# Patient Record
Sex: Female | Born: 1940 | Race: Black or African American | Hispanic: No | Marital: Single | Smoking: Former smoker
Health system: Southern US, Community
[De-identification: ages and names within clinical notes are randomized; demographics above are authoritative.]

## PROBLEM LIST (undated history)

## (undated) DIAGNOSIS — N289 Disorder of kidney and ureter, unspecified: Secondary | ICD-10-CM

## (undated) DIAGNOSIS — N189 Chronic kidney disease, unspecified: Secondary | ICD-10-CM

## (undated) DIAGNOSIS — T829XXA Unspecified complication of cardiac and vascular prosthetic device, implant and graft, initial encounter: Secondary | ICD-10-CM

## (undated) DIAGNOSIS — D649 Anemia, unspecified: Secondary | ICD-10-CM

## (undated) DIAGNOSIS — L89159 Pressure ulcer of sacral region, unspecified stage: Secondary | ICD-10-CM

## (undated) DIAGNOSIS — I10 Essential (primary) hypertension: Secondary | ICD-10-CM

## (undated) DIAGNOSIS — I429 Cardiomyopathy, unspecified: Secondary | ICD-10-CM

## (undated) DIAGNOSIS — E039 Hypothyroidism, unspecified: Secondary | ICD-10-CM

## (undated) DIAGNOSIS — H409 Unspecified glaucoma: Secondary | ICD-10-CM

## (undated) DIAGNOSIS — Z9989 Dependence on other enabling machines and devices: Secondary | ICD-10-CM

## (undated) DIAGNOSIS — I509 Heart failure, unspecified: Secondary | ICD-10-CM

## (undated) HISTORY — PX: CATARACT EXTRACTION W/ INTRAOCULAR LENS IMPLANT: SHX1309

---

## 2006-01-17 ENCOUNTER — Ambulatory Visit: Payer: Self-pay | Admitting: Ophthalmology

## 2012-07-03 ENCOUNTER — Ambulatory Visit: Payer: Self-pay | Admitting: Family Medicine

## 2016-05-18 ENCOUNTER — Encounter: Payer: Self-pay | Admitting: *Deleted

## 2016-05-18 ENCOUNTER — Emergency Department
Admission: EM | Admit: 2016-05-18 | Discharge: 2016-05-18 | Disposition: A | Discharge status: Other Acute Inpatient Hospital | Payer: Medicare Other | Attending: Emergency Medicine | Admitting: Emergency Medicine

## 2016-05-18 DIAGNOSIS — T22312A Burn of third degree of left forearm, initial encounter: Secondary | ICD-10-CM | POA: Insufficient documentation

## 2016-05-18 DIAGNOSIS — X088XXA Exposure to other specified smoke, fire and flames, initial encounter: Secondary | ICD-10-CM | POA: Diagnosis not present

## 2016-05-18 DIAGNOSIS — Y999 Unspecified external cause status: Secondary | ICD-10-CM | POA: Diagnosis not present

## 2016-05-18 DIAGNOSIS — Y93G3 Activity, cooking and baking: Secondary | ICD-10-CM | POA: Diagnosis not present

## 2016-05-18 DIAGNOSIS — I10 Essential (primary) hypertension: Secondary | ICD-10-CM | POA: Insufficient documentation

## 2016-05-18 DIAGNOSIS — F17200 Nicotine dependence, unspecified, uncomplicated: Secondary | ICD-10-CM | POA: Insufficient documentation

## 2016-05-18 DIAGNOSIS — Y929 Unspecified place or not applicable: Secondary | ICD-10-CM | POA: Diagnosis not present

## 2016-05-18 HISTORY — DX: Disorder of kidney and ureter, unspecified: N28.9

## 2016-05-18 HISTORY — DX: Essential (primary) hypertension: I10

## 2016-05-18 LAB — CBC WITH DIFFERENTIAL/PLATELET
BASOS ABS: 0.1 10*3/uL (ref 0–0.1)
Basophils Relative: 1 %
Eosinophils Absolute: 0.1 10*3/uL (ref 0–0.7)
Eosinophils Relative: 2 %
HEMATOCRIT: 29.7 % — AB (ref 35.0–47.0)
HEMOGLOBIN: 9.4 g/dL — AB (ref 12.0–16.0)
Lymphocytes Relative: 24 %
Lymphs Abs: 1.7 10*3/uL (ref 1.0–3.6)
MCH: 28.5 pg (ref 26.0–34.0)
MCHC: 31.5 g/dL — ABNORMAL LOW (ref 32.0–36.0)
MCV: 90.4 fL (ref 80.0–100.0)
Monocytes Absolute: 1 10*3/uL — ABNORMAL HIGH (ref 0.2–0.9)
Monocytes Relative: 14 %
NEUTROS ABS: 4.3 10*3/uL (ref 1.4–6.5)
NEUTROS PCT: 59 %
Platelets: 323 10*3/uL (ref 150–440)
RBC: 3.28 MIL/uL — ABNORMAL LOW (ref 3.80–5.20)
RDW: 14.3 % (ref 11.5–14.5)
WBC: 7.2 10*3/uL (ref 3.6–11.0)

## 2016-05-18 LAB — GLUCOSE, CAPILLARY: GLUCOSE-CAPILLARY: 70 mg/dL (ref 65–99)

## 2016-05-18 LAB — BASIC METABOLIC PANEL
ANION GAP: 14 (ref 5–15)
BUN: 55 mg/dL — ABNORMAL HIGH (ref 6–20)
CALCIUM: 8.4 mg/dL — AB (ref 8.9–10.3)
CO2: 15 mmol/L — AB (ref 22–32)
Chloride: 113 mmol/L — ABNORMAL HIGH (ref 101–111)
Creatinine, Ser: 5.81 mg/dL — ABNORMAL HIGH (ref 0.44–1.00)
GFR, EST AFRICAN AMERICAN: 7 mL/min — AB (ref >60)
GFR, EST NON AFRICAN AMERICAN: 6 mL/min — AB (ref >60)
GLUCOSE: 81 mg/dL (ref 65–99)
POTASSIUM: 4.2 mmol/L (ref 3.5–5.1)
Sodium: 142 mmol/L (ref 135–145)

## 2016-05-18 MED ORDER — FENTANYL CITRATE (PF) 100 MCG/2ML IJ SOLN
100.0000 ug | INTRAMUSCULAR | Status: DC | PRN
  Administered 2016-05-18: 100 ug via INTRAVENOUS
  Filled 2016-05-18: qty 2
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
## 2016-05-18 NOTE — ED Notes (Signed)
Called for transport acems  [phone]

## 2016-12-20 ENCOUNTER — Encounter: Payer: Self-pay | Admitting: *Deleted

## 2016-12-21 NOTE — Discharge Instructions (Signed)
Cataract Surgery, Care After °Refer to this sheet in the next few weeks. These instructions provide you with information about caring for yourself after your procedure. Your health care provider may also give you more specific instructions. Your treatment has been planned according to current medical practices, but problems sometimes occur. Call your health care provider if you have any problems or questions after your procedure. °What can I expect after the procedure? °After the procedure, it is common to have: °· Itching. °· Discomfort. °· Fluid discharge. °· Sensitivity to light and to touch. °· Bruising. °Follow these instructions at home: °Eye Care  °· Check your eye every day for signs of infection. Watch for: °¨ Redness, swelling, or pain. °¨ Fluid, blood, or pus. °¨ Warmth. °¨ Bad smell. °Activity  °· Avoid strenuous activities, such as playing contact sports, for as long as told by your health care provider. °· Do not drive or operate heavy machinery until your health care provider approves. °· Do not bend or lift heavy objects . Bending increases pressure in the eye. You can walk, climb stairs, and do light household chores. °· Ask your health care provider when you can return to work. If you work in a dusty environment, you may be advised to wear protective eyewear for a period of time. °General instructions  °· Take or apply over-the-counter and prescription medicines only as told by your health care provider. This includes eye drops. °· Do not touch or rub your eyes. °· If you were given a protective shield, wear it as told by your health care provider. If you were not given a protective shield, wear sunglasses as told by your health care provider to protect your eyes. °· Keep the area around your eye clean and dry. Avoid swimming or allowing water to hit you directly in the face while showering until told by your health care provider. Keep soap and shampoo out of your eyes. °· Do not put a contact lens  into the affected eye or eyes until your health care provider approves. °· Keep all follow-up visits as told by your health care provider. This is important. °Contact a health care provider if: ° °· You have increased bruising around your eye. °· You have pain that is not helped with medicine. °· You have a fever. °· You have redness, swelling, or pain in your eye. °· You have fluid, blood, or pus coming from your incision. °· Your vision gets worse. °Get help right away if: °· You have sudden vision loss. °This information is not intended to replace advice given to you by your health care provider. Make sure you discuss any questions you have with your health care provider. °Document Released: 03/31/2005 Document Revised: 01/20/2016 Document Reviewed: 07/22/2015 °Elsevier Interactive Patient Education © 2017 [employer] ° ° ° ° °General Anesthesia, Adult, Care After °These instructions provide you with information about caring for yourself after your procedure. Your health care provider may also give you more specific instructions. Your treatment has been planned according to current medical practices, but problems sometimes occur. Call your health care provider if you have any problems or questions after your procedure. °What can I expect after the procedure? °After the procedure, it is common to have: °· Vomiting. °· A sore throat. °· Mental slowness. °It is common to feel: °· Nauseous. °· Cold or shivery. °· Sleepy. °· Tired. °· Sore or achy, even in parts of your body where you did not have surgery. °Follow these instructions at   home: °For at least 24 hours after the procedure:  °· Do not: °¨ Participate in activities where you could fall or become injured. °¨ Drive. °¨ Use heavy machinery. °¨ Drink alcohol. °¨ Take sleeping pills or medicines that cause drowsiness. °¨ Make important decisions or sign legal documents. °¨ Take care of children on your own. °· Rest. °Eating and drinking  °· If you vomit, drink  water, juice, or soup when you can drink without vomiting. °· Drink enough fluid to keep your urine clear or pale yellow. °· Make sure you have little or no nausea before eating solid foods. °· Follow the diet recommended by your health care provider. °General instructions  °· Have a responsible adult stay with you until you are awake and alert. °· Return to your normal activities as told by your health care provider. Ask your health care provider what activities are safe for you. °· Take over-the-counter and prescription medicines only as told by your health care provider. °· If you smoke, do not smoke without supervision. °· Keep all follow-up visits as told by your health care provider. This is important. °Contact a health care provider if: °· You continue to have nausea or vomiting at home, and medicines are not helpful. °· You cannot drink fluids or start eating again. °· You cannot urinate after 8-12 hours. °· You develop a skin rash. °· You have fever. °· You have increasing redness at the site of your procedure. °Get help right away if: °· You have difficulty breathing. °· You have chest pain. °· You have unexpected bleeding. °· You feel that you are having a life-threatening or urgent problem. °This information is not intended to replace advice given to you by your health care provider. Make sure you discuss any questions you have with your health care provider. °Document Released: 12/18/2000 Document Revised: 02/14/2016 Document Reviewed: 08/26/2015 °Elsevier Interactive Patient Education © 2017 [employer] Inc[employer] ° °

## 2016-12-25 ENCOUNTER — Ambulatory Visit: Payer: Medicare Other | Admitting: Anesthesiology

## 2016-12-25 ENCOUNTER — Encounter
Admission: RE | Disposition: A | Discharge status: Home / Self Care | Payer: Self-pay | Source: Ambulatory Visit | Attending: Ophthalmology

## 2016-12-25 ENCOUNTER — Ambulatory Visit
Admission: RE | Admit: 2016-12-25 | Discharge: 2016-12-25 | Disposition: A | Discharge status: Home / Self Care | Payer: Medicare Other | Source: Ambulatory Visit | Attending: Ophthalmology | Admitting: Ophthalmology

## 2016-12-25 DIAGNOSIS — H2511 Age-related nuclear cataract, right eye: Secondary | ICD-10-CM | POA: Diagnosis present

## 2016-12-25 DIAGNOSIS — F17200 Nicotine dependence, unspecified, uncomplicated: Secondary | ICD-10-CM | POA: Insufficient documentation

## 2016-12-25 DIAGNOSIS — E039 Hypothyroidism, unspecified: Secondary | ICD-10-CM | POA: Diagnosis not present

## 2016-12-25 HISTORY — DX: Hypothyroidism, unspecified: E03.9

## 2016-12-25 HISTORY — PX: CATARACT EXTRACTION W/PHACO: SHX586

## 2016-12-25 HISTORY — DX: Dependence on other enabling machines and devices: Z99.89

## 2016-12-25 SURGERY — PHACOEMULSIFICATION, CATARACT, WITH IOL INSERTION
Anesthesia: Monitor Anesthesia Care | Site: Eye | Laterality: Right | Wound class: Clean

## 2016-12-25 MED ORDER — LABETALOL HCL 5 MG/ML IV SOLN
5.0000 mg | INTRAVENOUS | Status: DC | PRN
  Administered 2016-12-25: 5 mg via INTRAVENOUS

## 2016-12-25 MED ORDER — ACETAMINOPHEN 160 MG/5ML PO SOLN: 325.0000 mg | ORAL | Status: DC | PRN

## 2016-12-25 MED ORDER — BRIMONIDINE TARTRATE 0.2 % OP SOLN
OPHTHALMIC | Status: DC | PRN
  Administered 2016-12-25: 1 [drp] via OPHTHALMIC

## 2016-12-25 MED ORDER — LIDOCAINE HCL (PF) 2 % IJ SOLN
INTRAMUSCULAR | Status: DC | PRN
  Administered 2016-12-25: 1 mL

## 2016-12-25 MED ORDER — EPINEPHRINE PF 1 MG/ML IJ SOLN
INTRAOCULAR | Status: DC | PRN
  Administered 2016-12-25: 122 mL via OPHTHALMIC

## 2016-12-25 MED ORDER — ONDANSETRON HCL 4 MG/2ML IJ SOLN: 4.0000 mg | Freq: Once | INTRAMUSCULAR | Status: DC | PRN

## 2016-12-25 MED ORDER — MOXIFLOXACIN HCL 0.5 % OP SOLN
OPHTHALMIC | Status: DC | PRN
  Administered 2016-12-25: .2 mL via OPHTHALMIC

## 2016-12-25 MED ORDER — NA HYALUR & NA CHOND-NA HYALUR 0.4-0.35 ML IO KIT
PACK | INTRAOCULAR | Status: DC | PRN
  Administered 2016-12-25: 1 mL via INTRAOCULAR

## 2016-12-25 MED ORDER — MIDAZOLAM HCL 2 MG/2ML IJ SOLN
INTRAMUSCULAR | Status: DC | PRN
  Administered 2016-12-25: 1 mg via INTRAVENOUS

## 2016-12-25 MED ORDER — ACETAMINOPHEN 325 MG PO TABS
325.0000 mg | ORAL_TABLET | ORAL | Status: DC | PRN
  Administered 2016-12-25: 650 mg via ORAL

## 2016-12-25 MED ORDER — FENTANYL CITRATE (PF) 100 MCG/2ML IJ SOLN
INTRAMUSCULAR | Status: DC | PRN
  Administered 2016-12-25: 50 ug via INTRAVENOUS

## 2016-12-25 MED ORDER — ARMC OPHTHALMIC DILATING DROPS
1.0000 "application " | OPHTHALMIC | Status: DC | PRN
  Administered 2016-12-25 (×3): 1 via OPHTHALMIC

## 2016-12-25 MED ORDER — TIMOLOL MALEATE 0.5 % OP SOLN
OPHTHALMIC | Status: DC | PRN
  Administered 2016-12-25: 1 [drp] via OPHTHALMIC

## 2016-12-25 MED ORDER — TRYPAN BLUE 0.06 % OP SOLN
OPHTHALMIC | Status: DC | PRN
  Administered 2016-12-25: 0.5 mL via INTRAOCULAR

## 2016-12-25 MED ORDER — MOXIFLOXACIN HCL 0.5 % OP SOLN
1.0000 [drp] | OPHTHALMIC | Status: DC | PRN
  Administered 2016-12-25 (×3): 1 [drp] via OPHTHALMIC

## 2016-12-25 SURGICAL SUPPLY — 23 items
APPLICATOR COTTON TIP 3IN (MISCELLANEOUS) ×3 IMPLANT
CANNULA ANT/CHMB 27GA (MISCELLANEOUS) ×3 IMPLANT
DISSECTOR HYDRO NUCLEUS 50X22 (MISCELLANEOUS) ×3 IMPLANT
GLOVE BIO SURGEON STRL SZ7 (GLOVE) ×3 IMPLANT
GLOVE SURG LX 6.5 MICRO (GLOVE) ×2
GLOVE SURG LX STRL 6.5 MICRO (GLOVE) ×1 IMPLANT
GOWN STRL REUS W/ TWL LRG LVL3 (GOWN DISPOSABLE) ×2 IMPLANT
GOWN STRL REUS W/TWL LRG LVL3 (GOWN DISPOSABLE) ×4
LENS IOL ACRSF IQ ULTRA 19.0 (Intraocular Lens) ×1 IMPLANT
LENS IOL ACRYSOF IQ 19.0 (Intraocular Lens) ×3 IMPLANT
MARKER SKIN DUAL TIP RULER LAB (MISCELLANEOUS) ×3 IMPLANT
PACK CATARACT BRASINGTON (MISCELLANEOUS) ×3 IMPLANT
PACK EYE AFTER SURG (MISCELLANEOUS) ×3 IMPLANT
PACK OPTHALMIC (MISCELLANEOUS) ×3 IMPLANT
RING MALYGIN 7.0 (MISCELLANEOUS) IMPLANT
SUT ETHILON 10-0 CS-B-6CS-B-6 (SUTURE)
SUT VICRYL  9 0 (SUTURE)
SUT VICRYL 9 0 (SUTURE) IMPLANT
SUTURE EHLN 10-0 CS-B-6CS-B-6 (SUTURE) IMPLANT
SYR TB 1ML LUER SLIP (SYRINGE) ×3 IMPLANT
WATER STERILE IRR 250ML POUR (IV SOLUTION) ×3 IMPLANT
WICK EYE OCUCEL (MISCELLANEOUS) IMPLANT
WIPE NON LINTING 3.25X3.25 (MISCELLANEOUS) ×3 IMPLANT

## 2016-12-25 NOTE — Transfer of Care (Signed)
Immediate Anesthesia Transfer of Care Note  Patient: Ann Cunningham  Procedure(s) Performed: Procedure(s) with comments: CATARACT EXTRACTION PHACO AND INTRAOCULAR LENS PLACEMENT ([place])  Right complicated (Right) - Vision Blue  Patient Location: PACU  Anesthesia Type: MAC  Level of Consciousness: awake, alert  and patient cooperative  Airway and Oxygen Therapy: Patient Spontanous Breathing and Patient connected to supplemental oxygen  Post-op Assessment: Post-op Vital signs reviewed, Patient's Cardiovascular Status Stable, Respiratory Function Stable, Patent Airway and No signs of Nausea or vomiting  Post-op Vital Signs: Reviewed and stable  Complications: No apparent anesthesia complications

## 2016-12-25 NOTE — Anesthesia Postprocedure Evaluation (Signed)
Anesthesia Post Note  Patient: Ann Cunningham  Procedure(s) Performed: Procedure(s) (LRB): CATARACT EXTRACTION PHACO AND INTRAOCULAR LENS PLACEMENT ([facility])  Right complicated (Right)  Patient location during evaluation: PACU Anesthesia Type: MAC Level of consciousness: awake Pain management: pain level controlled Vital Signs Assessment: post-procedure vital signs reviewed and stable Respiratory status: spontaneous breathing, nonlabored ventilation and respiratory function stable Cardiovascular status: stable and blood pressure returned to baseline Anesthetic complications: no    Veda Canning

## 2016-12-25 NOTE — Anesthesia Procedure Notes (Signed)
Procedure Name: MAC Date/Time: 12/25/2016 11:20 AM Performed by: Janna Arch Pre-anesthesia Checklist: Patient identified, Emergency Drugs available, Suction available and Patient being monitored Patient Re-evaluated:Patient Re-evaluated prior to inductionOxygen Delivery Method: Nasal cannula

## 2016-12-25 NOTE — Op Note (Signed)
Date of Surgery: 12/25/2016  PREOPERATIVE DIAGNOSES: Visually significant nuclear sclerotic cataract, right eye.  POSTOPERATIVE DIAGNOSES: Same  PROCEDURES PERFORMED: Cataract extraction with intraocular lens implant, right eye with the aid of vision blue dye.  SURGEON: Almon Hercules, M.D.  ANESTHESIA: MAC and topical  IMPLANTS: [id number] +19.0 D  Implant Name Type Inv. Item Serial No. Manufacturer Lot No. LRB No. Used  LENS IOL ACRYSOF IQ 19.0 - [id number] Intraocular Lens LENS IOL ACRYSOF IQ 19.0 [id number] [employer]   Right 1     COMPLICATIONS: None.  DESCRIPTION OF PROCEDURE: Therapeutic options were discussed with the patient preoperatively, including a discussion of risks and benefits of surgery. Informed consent was obtained. An IOL-Master and immersion biometry were used to take the lens measurements, and a dilated fundus exam was performed within 6 months of the surgical date.  The patient was premedicated and brought to the operating room and placed on the operating table in the supine position. After adequate anesthesia, the patient was prepped and draped in the usual sterile ophthalmic fashion. A wire lid speculum was inserted and the microscope was positioned. A Superblade was used to create a paracentesis site at the limbus and a small amount of dilute preservative free lidocaine was instilled into the anterior chamber, followed by dispersive viscoelastic. A clear corneal incision was created temporally using a 2.4 mm keratome blade. Capsulorrhexis was then performed. In situ phacoemulsification was performed.  Cortical material was removed with the irrigation-aspiration unit. Dispersive viscoelastic was instilled to open the capsular bag. A posterior chamber intraocular lens with the specifications above was inserted and positioned. Irrigation-aspiration was used to remove all viscoelastic. Vigamox 1cc was instilled into the anterior chamber, and the corneal incision was checked and  found to be water tight. The eyelid speculum was removed.  The operative eye was covered with protective goggles after instilling 1 drop of timolol and brimonidine. The patient tolerated the procedure well. There were no complications.

## 2016-12-25 NOTE — H&P (Signed)
H+P reviewed and is up to date, please see paper chart.  
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
## 2016-12-26 ENCOUNTER — Encounter: Payer: Self-pay | Admitting: Ophthalmology

## 2017-01-04 ENCOUNTER — Emergency Department: Payer: Medicare Other

## 2017-01-04 ENCOUNTER — Emergency Department
Admission: EM | Admit: 2017-01-04 | Discharge: 2017-01-04 | Disposition: A | Discharge status: Home / Self Care | Payer: Medicare Other | Attending: Emergency Medicine | Admitting: Emergency Medicine

## 2017-01-04 DIAGNOSIS — M79605 Pain in left leg: Secondary | ICD-10-CM | POA: Diagnosis present

## 2017-01-04 DIAGNOSIS — I6782 Cerebral ischemia: Secondary | ICD-10-CM | POA: Diagnosis not present

## 2017-01-04 DIAGNOSIS — F17290 Nicotine dependence, other tobacco product, uncomplicated: Secondary | ICD-10-CM | POA: Insufficient documentation

## 2017-01-04 DIAGNOSIS — E039 Hypothyroidism, unspecified: Secondary | ICD-10-CM | POA: Insufficient documentation

## 2017-01-04 DIAGNOSIS — Z7982 Long term (current) use of aspirin: Secondary | ICD-10-CM | POA: Insufficient documentation

## 2017-01-04 DIAGNOSIS — I10 Essential (primary) hypertension: Secondary | ICD-10-CM | POA: Insufficient documentation

## 2017-01-04 LAB — BASIC METABOLIC PANEL
Anion gap: 13 (ref 5–15)
BUN: 55 mg/dL — AB (ref 6–20)
CHLORIDE: 105 mmol/L (ref 101–111)
CO2: 22 mmol/L (ref 22–32)
Calcium: 8 mg/dL — ABNORMAL LOW (ref 8.9–10.3)
Creatinine, Ser: 5.4 mg/dL — ABNORMAL HIGH (ref 0.44–1.00)
GFR calc Af Amer: 8 mL/min — ABNORMAL LOW (ref >60)
GFR calc non Af Amer: 7 mL/min — ABNORMAL LOW (ref >60)
GLUCOSE: 98 mg/dL (ref 65–99)
POTASSIUM: 3.8 mmol/L (ref 3.5–5.1)
Sodium: 140 mmol/L (ref 135–145)

## 2017-01-04 LAB — CBC WITH DIFFERENTIAL/PLATELET
Basophils Absolute: 0.1 10*3/uL (ref 0–0.1)
Basophils Relative: 1 %
Eosinophils Absolute: 0.1 10*3/uL (ref 0–0.7)
Eosinophils Relative: 1 %
HEMATOCRIT: 27.9 % — AB (ref 35.0–47.0)
HEMOGLOBIN: 8.9 g/dL — AB (ref 12.0–16.0)
Lymphocytes Relative: 25 %
Lymphs Abs: 1.5 10*3/uL (ref 1.0–3.6)
MCH: 28.4 pg (ref 26.0–34.0)
MCHC: 32 g/dL (ref 32.0–36.0)
MCV: 88.7 fL (ref 80.0–100.0)
MONO ABS: 0.9 10*3/uL (ref 0.2–0.9)
MONOS PCT: 14 %
Neutro Abs: 3.6 10*3/uL (ref 1.4–6.5)
Neutrophils Relative %: 59 %
PLATELETS: 247 10*3/uL (ref 150–440)
RBC: 3.14 MIL/uL — ABNORMAL LOW (ref 3.80–5.20)
RDW: 15 % — ABNORMAL HIGH (ref 11.5–14.5)
WBC: 6.1 10*3/uL (ref 3.6–11.0)

## 2017-01-04 MED ORDER — DICLOFENAC SODIUM 1 % TD GEL: 2.0000 g | Freq: Four times a day (QID) | TRANSDERMAL | 0 refills | Status: DC

## 2017-01-04 NOTE — ED Provider Notes (Signed)
[facility] Emergency Department Provider Note  ____________________________________________   I have reviewed the triage vital signs and the nursing notes.   HISTORY  Chief Complaint Leg Pain and Leg Swelling   History limited by: Not Limited   HPI Ann Cunningham is a 76 y.o. female who presents to the emergency department today because of concerns for some discomfort to her left hip and leg. She states this is been going on for almost one week. The pain is worse when the patient is trying to move the leg. She feels like it is heavy. She denies any numbness to the leg. Denies any injury to the leg.   Past Medical History:  Diagnosis Date  . Hypertension   . Hypothyroidism   . Renal disorder   . Uses walker     There are no active problems to display for this patient.   Past Surgical History:  Procedure Laterality Date  . CATARACT EXTRACTION W/ INTRAOCULAR LENS IMPLANT    . CATARACT EXTRACTION W/PHACO Right 12/25/2016   Procedure: CATARACT EXTRACTION PHACO AND INTRAOCULAR LENS PLACEMENT ([facility])  Right complicated;  Surgeon: Sherald Hess, MD;  Location[facility];  Service: Ophthalmology;  Laterality: Right;  Vision Blue    Prior to Admission medications   Medication Sig Start Date End Date Taking? Authorizing Provider  aspirin 81 MG tablet Take 81 mg by mouth daily.    Historical Provider, MD  Brinzolamide-[facility]rimon[facility] OP) Apply to eye daily as needed.    Historical Provider, MD  furosemide (LASIX) 20 MG tablet Take 20 mg by mouth.    Historical Provider, MD  levothyroxine (SYNTHROID, LEVOTHROID) 25 MCG tablet Take 25 mcg by mouth daily before breakfast.    Historical Provider, MD  pravastatin (PRAVACHOL) 20 MG tablet Take 20 mg by mouth daily.    Historical Provider, MD    Allergies Patient has no known allergies.  No family history on file.  Social History Social History  Substance Use Topics  . Smoking status:  Current Some Day Smoker  . Smokeless tobacco: Current User    Types: Snuff  . Alcohol use No    Review of Systems  Constitutional: Negative for fever. Cardiovascular: Negative for chest pain. Respiratory: Negative for shortness of breath. Gastrointestinal: Negative for abdominal pain, vomiting and diarrhea. Genitourinary: Negative for dysuria. Musculoskeletal: Positive for left hip pain. Neurological: Negative for headaches, focal weakness or numbness.  10-point ROS otherwise negative.  ____________________________________________   PHYSICAL EXAM:  VITAL SIGNS: ED Triage Vitals  Enc Vitals Group     BP 01/04/17 1408 133/61     Pulse Rate 01/04/17 1408 (!) 107     Resp 01/04/17 1408 18     Temp 01/04/17 1408 98.8 F (37.1 C)     Temp Source 01/04/17 1408 Oral     SpO2 01/04/17 1408 100 %     Weight 01/04/17 1405 155 lb (70.3 kg)     Height 01/04/17 1405  (1.575 m)   Constitutional: Alert and oriented. Well appearing and in no distress. Eyes: Conjunctivae are normal. Normal extraocular movements. ENT   Head: Normocephalic and atraumatic.   Nose: No congestion/rhinnorhea.   Mouth/Throat: Mucous membranes are moist.   Neck: No stridor. Hematological/Lymphatic/Immunilogical: No cervical lymphadenopathy. Cardiovascular: Normal rate, regular rhythm.  No murmurs, rubs, or gallops. Respiratory: Normal respiratory effort without tachypnea nor retractions. Breath sounds are clear and equal bilaterally. No wheezes/rales/rhonchi. Gastrointestinal: Soft and non tender. No rebound. No guarding.  Genitourinary:  Deferred Musculoskeletal: Normal range of motion in all extremities. No lower extremity edema. Neurologic:  Normal speech and language. No gross focal neurologic deficits are appreciated.  Skin:  Skin is warm, dry and intact. No rash noted. Psychiatric: Mood and affect are normal. Speech and behavior are normal. Patient exhibits appropriate insight and  judgment.  ____________________________________________    LABS (pertinent positives/negatives)  Labs Reviewed  CBC WITH DIFFERENTIAL/PLATELET - Abnormal; Notable for the following:       Result Value   RBC 3.14 (*)    Hemoglobin 8.9 (*)    HCT 27.9 (*)    RDW 15.0 (*)    All other components within normal limits  BASIC METABOLIC PANEL - Abnormal; Notable for the following:    BUN 55 (*)    Creatinine, Ser 5.40 (*)    Calcium 8.0 (*)    GFR calc non Af Amer 7 (*)    GFR calc Af Amer 8 (*)    All other components within normal limits     ____________________________________________   EKG  None  ____________________________________________    RADIOLOGY  CT head  CLINICAL DATA: Left leg weakness  EXAM: CT HEAD WITHOUT CONTRAST  TECHNIQUE: Contiguous axial images were obtained from the base of the skull through the vertex without intravenous contrast.  COMPARISON: None.  FINDINGS: Brain: Superficial and central atrophy with chronic appearing small vessel ischemic disease of periventricular subcortical white matter. No acute intracranial hemorrhage, large vascular territory infarction, midline shift nor extra-axial fluid collections. 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  Vascular: Minimal atherosclerosis of the carotid siphons bilaterally.  Skull: Normal. Negative for fracture or focal lesion.  Sinuses/Orbits: No acute finding.  Other: None.  IMPRESSION: 1. 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 2. Chronic appearing small vessel ischemic changes of periventricular white matter with carotid siphon calcifications bilaterally. 3. No acute intracranial abnormality is identified. 4. Superficial and central atrophy.        ____________________________________________   PROCEDURES  Procedures  ____________________________________________   INITIAL  IMPRESSION / ASSESSMENT AND PLAN / ED COURSE  Pertinent labs & imaging results that were available during my care of the patient were reviewed by me and considered in my medical decision making (see chart for details).  Patient presented to the emergency department today because of concerns for some left hip pain. Workup. Did not show any acute findings. CT was obtained given that she felt some heaviness to the leg. However on further discussion it appears that the patient is primarily concerned about the pain in her hip. Given that findings were not acute today feel   patient can safely home and follow-up with primary care. Will try NSAID cream for discomfort.  ____________________________________________   FINAL CLINICAL IMPRESSION(S) / ED DIAGNOSES  Final diagnoses:  Left leg pain     Note: This dictation was prepared with Dragon dictation. Any transcriptional errors that result from this process are unintentional     Phineas Semen, MD 01/04/17 2103

## 2017-01-04 NOTE — Discharge Instructions (Signed)
Please seek medical attention for any high fevers, chest pain, shortness of breath, change in behavior, persistent vomiting, bloody stool or any other new or concerning symptoms.  

## 2017-01-04 NOTE — ED Triage Notes (Addendum)
Pt reports left ankle swelling and trouble ambulating. Pt reports using a walker, but having to pick up left leg to move it. Pt denies pain at this time. Denies N/V . Pt also reports having right cataract surgery last Monday and has noticed an inc in drainage in both eyes.

## 2017-03-13 ENCOUNTER — Ambulatory Visit: Payer: Medicare Other | Admitting: Internal Medicine

## 2017-03-20 ENCOUNTER — Encounter: Payer: Medicare Other | Attending: Internal Medicine | Admitting: Internal Medicine

## 2017-03-20 DIAGNOSIS — H409 Unspecified glaucoma: Secondary | ICD-10-CM | POA: Diagnosis not present

## 2017-03-20 DIAGNOSIS — Z87891 Personal history of nicotine dependence: Secondary | ICD-10-CM | POA: Insufficient documentation

## 2017-03-20 DIAGNOSIS — X088XXD Exposure to other specified smoke, fire and flames, subsequent encounter: Secondary | ICD-10-CM | POA: Diagnosis not present

## 2017-03-20 DIAGNOSIS — N185 Chronic kidney disease, stage 5: Secondary | ICD-10-CM | POA: Insufficient documentation

## 2017-03-20 DIAGNOSIS — I120 Hypertensive chronic kidney disease with stage 5 chronic kidney disease or end stage renal disease: Secondary | ICD-10-CM | POA: Diagnosis not present

## 2017-03-20 DIAGNOSIS — T22312D Burn of third degree of left forearm, subsequent encounter: Secondary | ICD-10-CM | POA: Diagnosis not present
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
## 2017-03-21 NOTE — Progress Notes (Signed)
Ann, Cunningham ([id number]) Visit Report for 03/20/2017 Allergy List Details Patient Name: Ann Cunningham, FULL Date of Service: 03/20/2017 12:45 PM Medical Record Patient Account Number: [id number] Number: Treating RN: Huel Coventry [birth year] (76 y.o. Other Clinician: Date of Birth/Sex: Female) Treating ROBSON, MICHAEL Primary Care Gurley Climer: Lanier Ensign Samuella Rasool/Extender: G Referring Marzelle Rutten: Lanier Ensign Weeks in Treatment: 0 Allergies Active Allergies No Known Drug Allergies Allergy Notes Electronic Signature(s) Signed: 03/20/2017 4:42:12 PM By: Elliot Gurney, BSN, RN, CWS, Kim RN, BSN Entered By: Elliot Gurney, BSN, RN, CWS, Kim on 03/20/2017 12:57:54 Ann Cunningham ([id number]) -------------------------------------------------------------------------------- Arrival Information Details Patient Name: Ann Cunningham Date of Service: 03/20/2017 12:45 PM Medical Record Patient Account Number: [id number] Number: Treating RN: Huel Coventry [birth year] (76 y.o. Other Clinician: Date of Birth/Sex: Female) Treating ROBSON, MICHAEL Primary Care Neftali Abair: Lanier Ensign Argenis Kumari/Extender: G Referring Krish Bailly: Vito Backers in Treatment: 0 Visit Information Patient Arrived: Wheel Chair Arrival Time: 12:54 Accompanied By: self Transfer Assistance: None Patient Identification Verified: Yes Secondary Verification Process Yes Completed: Patient Has Alerts: Yes Patient Alerts: Patient on Blood Thinner Aspirin 81 MG NOT DIABETIC Notes stayed in chair Electronic Signature(s) Signed: 03/20/2017 4:42:12 PM By: Elliot Gurney, BSN, RN, CWS, Kim RN, BSN Entered By: Elliot Gurney, BSN, RN, CWS, Kim on 03/20/2017 12:56:21 Ann Cunningham ([id number]) -------------------------------------------------------------------------------- Clinic Level of Care Assessment Details Patient Name: Ann Cunningham Date of Service: 03/20/2017 12:45 PM Medical Record Patient Account Number:  [id number] Number: Treating RN: Huel Coventry 1940/12/10 (76 y.o. Other Clinician: Date of Birth/Sex: Female) Treating ROBSON, MICHAEL Primary Care Garrin Kirwan: Lanier Ensign Demitria Hay/Extender: G Referring Zakarie Sturdivant: Lanier Ensign Weeks in Treatment: 0 Clinic Level of Care Assessment Items TOOL 1 Quantity Score []  - Use when EandM and Procedure is performed on INITIAL visit 0 ASSESSMENTS - Nursing Assessment / Reassessment X - General Physical Exam (combine w/ comprehensive assessment (listed just 1 20 below) when performed on new pt. evals) X - Comprehensive Assessment (HX, ROS, Risk Assessments, Wounds Hx, etc.) 1 25 ASSESSMENTS - Wound and Skin Assessment / Reassessment []  - Dermatologic / Skin Assessment (not related to wound area) 0 ASSESSMENTS - Ostomy and/or Continence Assessment and Care []  - Incontinence Assessment and Management 0 []  - Ostomy Care Assessment and Management (repouching, etc.) 0 PROCESS - Coordination of Care X - Simple Patient / Family Education for ongoing care 1 15 []  - Complex (extensive) Patient / Family Education for ongoing care 0 X - Staff obtains Chiropractor, Records, Test Results / Process Orders 1 10 []  - Staff telephones HHA, Nursing Homes / Clarify orders / etc 0 []  - Routine Transfer to another Facility (non-emergent condition) 0 []  - Routine Hospital Admission (non-emergent condition) 0 X - New Admissions / Manufacturing engineer / Ordering NPWT, Apligraf, etc. 1 15 []  - Emergency Hospital Admission (emergent condition) 0 PROCESS - Special Needs []  - Pediatric / Minor Patient Management 0 Lindbloom, Marylouise Stacks ([id number]) []  - Isolation Patient Management 0 []  - Hearing / Language / Visual special needs 0 []  - Assessment of Community assistance (transportation, D/C planning, etc.) 0 []  - Additional assistance / Altered mentation 0 []  - Support Surface(s) Assessment (bed, cushion, seat, etc.) 0 INTERVENTIONS - Miscellaneous []  - External ear  exam 0 []  - Patient Transfer (multiple staff / Nurse, adult / Similar devices) 0 []  - Simple Staple / Suture removal (25 or less) 0 []  - Complex Staple / Suture removal (26 or more) 0 []  - Hypo/Hyperglycemic Management (do not check if billed separately) 0 []  - Ankle / Brachial Index (ABI) -  do not check if billed separately 0 Has the patient been seen at the hospital within the last three years: Yes Total Score: 85 Level Of Care: New/Established - Level 3 Electronic Signature(s) Signed: 03/20/2017 4:42:12 PM By: Elliot GurneyWoody, BSN, RN, CWS, Kim RN, BSN Entered By: Elliot GurneyWoody, BSN, RN, CWS, Kim on 03/20/2017 13:52:44 Ann CoolerSLADE, Shenika ([id number]) -------------------------------------------------------------------------------- Encounter Discharge Information Details Patient Name: Ann Cunningham Date of Service: 03/20/2017 12:45 PM Medical Record Patient Account Number: [id number] Number: Treating RN: Huel CoventryWoody, Kim 12-30-1940 (76 y.o. Other Clinician: Date of Birth/Sex: Female) Treating ROBSON, MICHAEL Primary Care Cherri Yera: Lanier EnsignSOLES, MEREDITH Farid Grigorian/Extender: G Referring Breydan Shillingburg: Lanier EnsignSOLES, MEREDITH Weeks in Treatment: 0 Encounter Discharge Information Items Discharge Pain Level: 0 Discharge Condition: Stable Ambulatory Status: Wheelchair Discharge Destination: Home Transportation: Other Accompanied By: self Schedule Follow-up Appointment: Yes Medication Reconciliation completed and provided to Patient/Care Yes Mayuri Staples: Provided on Clinical Summary of Care: 03/20/2017 Form Type Recipient Paper Patient WS Electronic Signature(s) Signed: 03/20/2017 4:42:12 PM By: Elliot GurneyWoody, BSN, RN, CWS, Kim RN, BSN Previous Signature: 03/20/2017 1:46:27 PM Version By: Gwenlyn PerkingMoore, Shelia Entered By: Elliot GurneyWoody, BSN, RN, CWS, Kim on 03/20/2017 13:53:46 Ann Cunningham ([id number]) -------------------------------------------------------------------------------- Lower Extremity Assessment Details Patient Name: Ann CoolerSLADE, Hatsumi Date  of Service: 03/20/2017 12:45 PM Medical Record Patient Account Number: [id number] Number: Treating RN: Huel CoventryWoody, Kim 12-30-1940 (76 y.o. Other Clinician: Date of Birth/Sex: Female) Treating ROBSON, MICHAEL Primary Care Aundre Hietala: Lanier EnsignSOLES, MEREDITH Lyann Hagstrom/Extender: G Referring Coila Wardell: Vito BackersSOLES, MEREDITH Weeks in Treatment: 0 Electronic Signature(s) Signed: 03/20/2017 1:19:40 PM By: Elliot GurneyWoody, BSN, RN, CWS, Kim RN, BSN Entered By: Elliot GurneyWoody, BSN, RN, CWS, Kim on 03/20/2017 13:19:40 Ann CoolerSLADE, Gaelyn ([id number]) -------------------------------------------------------------------------------- Multi Wound Chart Details Patient Name: Ann CoolerSLADE, Lanya Date of Service: 03/20/2017 12:45 PM Medical Record Patient Account Number: [id number] Number: Treating RN: Huel CoventryWoody, Kim 12-30-1940 (76 y.o. Other Clinician: Date of Birth/Sex: Female) Treating ROBSON, MICHAEL Primary Care Angles Trevizo: Lanier EnsignSOLES, MEREDITH Yohana Bartha/Extender: G Referring Brian Zeitlin: Lanier EnsignSOLES, MEREDITH Weeks in Treatment: 0 Vital Signs Height(in): 62 Pulse(bpm): 87 Weight(lbs): 145 Blood Pressure 101/61 (mmHg): Body Mass Index(BMI): 27 Temperature(F): 98.0 Respiratory Rate 16 (breaths/min): Photos: [N/A:N/A] Wound Location: Left Forearm N/A N/A Wounding Event: Thermal Burn N/A N/A Primary Etiology: 3rd degree Burn N/A N/A Comorbid History: Cataracts, Glaucoma, N/A N/A Hypertension, End Stage Renal Disease, History of Burn, Osteoarthritis Date Acquired: 07/09/2016 N/A N/A Weeks of Treatment: 0 N/A N/A Wound Status: Open N/A N/A Measurements L x W x D 6.4x5.5x0.1 N/A N/A (cm) Area (cm) : 27.646 N/A N/A Volume (cm) : 2.765 N/A N/A % Reduction in Area: 0.00% N/A N/A % Reduction in Volume: 0.00% N/A N/A Classification: Full Thickness Without N/A N/A Exposed Support Structures Exudate Amount: Large N/A N/A Exudate Type: Serosanguineous N/A N/A Exudate Color: red, brown N/A N/A Wound Margin: Flat and Intact N/A  N/A Plaza, Marylouise StacksWILMA ([id number]) Granulation Amount: Medium (34-66%) N/A N/A Granulation Quality: Red, Hyper-granulation N/A N/A Necrotic Amount: Medium (34-66%) N/A N/A Exposed Structures: Fat Layer (Subcutaneous N/A N/A Tissue) Exposed: Yes Fascia: No Tendon: No Muscle: No Joint: No Bone: No Epithelialization: Small (1-33%) N/A N/A Debridement: Debridement ([id number](11042- N/A N/A 11047) Pre-procedure 13:29 N/A N/A Verification/Time Out Taken: Pain Control: Lidocaine 4% Topical N/A N/A Solution Tissue Debrided: Subcutaneous N/A N/A Level: Skin/Subcutaneous N/A N/A Tissue Debridement Area (sq 35.2 N/A N/A cm): Instrument: Curette N/A N/A Bleeding: Minimum N/A N/A Hemostasis Achieved: Pressure N/A N/A Procedural Pain: 2 N/A N/A Post Procedural Pain: 2 N/A N/A Debridement Treatment Procedure was tolerated N/A N/A Response: well Post Debridement 6.4x5.5x0.1 N/A N/A Measurements L x  W x D (cm) Post Debridement 2.765 N/A N/A Volume: (cm) Periwound Skin Texture: Scarring: Yes N/A N/A Excoriation: No Induration: No Callus: No Crepitus: No Rash: No Periwound Skin Maceration: No N/A N/A Moisture: Dry/Scaly: No Periwound Skin Color: Atrophie Blanche: Yes N/A N/A Cyanosis: No Ecchymosis: No Erythema: No Hemosiderin Staining: No Mottled: No Bradner, Milca ([id number]) Pallor: No Rubor: No Tenderness on No N/A N/A Palpation: Wound Preparation: Ulcer Cleansing: N/A N/A Rinsed/Irrigated with Saline Topical Anesthetic Applied: None Procedures Performed: Debridement N/A N/A Treatment Notes Electronic Signature(s) Signed: 03/20/2017 4:49:17 PM By: Baltazar Najjar MD Previous Signature: 03/20/2017 1:24:46 PM Version By: Elliot Gurney, BSN, RN, CWS, Kim RN, BSN Entered By: Baltazar Najjar on 03/20/2017 13:42:36 Ann Cunningham ([id number]) -------------------------------------------------------------------------------- Multi-Disciplinary Care Plan Details Patient Name: Ann Cunningham Date of  Service: 03/20/2017 12:45 PM Medical Record Patient Account Number: [id number] Number: Treating RN: Huel Coventry 1941/08/20 (76 y.o. Other Clinician: Date of Birth/Sex: Female) Treating ROBSON, MICHAEL Primary Care Abbegail Matuska: Lanier Ensign Christofer Shen/Extender: G Referring Jaydalynn Olivero: Lanier Ensign Weeks in Treatment: 0 Active Inactive ` Orientation to the Wound Care Program Nursing Diagnoses: Knowledge deficit related to the wound healing center program Goals: Patient/caregiver will verbalize understanding of the Wound Healing Center Program Date Initiated: 03/20/2017 Target Resolution Date: 03/24/2017 Goal Status: Active Interventions: Provide education on orientation to the wound center Notes: ` Wound/Skin Impairment Nursing Diagnoses: Impaired tissue integrity Goals: Ulcer/skin breakdown will heal within 14 weeks Date Initiated: 03/20/2017 Target Resolution Date: 06/20/2017 Goal Status: Active Interventions: Assess patient/caregiver ability to obtain necessary supplies Assess ulceration(s) every visit Treatment Activities: Skin care regimen initiated : 03/20/2017 Notes: STORM, DULSKI ([id number]) Electronic Signature(s) Signed: 03/20/2017 1:24:15 PM By: Elliot Gurney, BSN, RN, CWS, Kim RN, BSN Previous Signature: 03/20/2017 1:20:12 PM Version By: Elliot Gurney, BSN, RN, CWS, Kim RN, BSN Entered By: Elliot Gurney, BSN, RN, CWS, Kim on 03/20/2017 13:24:14 Ann Cunningham ([id number]) -------------------------------------------------------------------------------- Pain Assessment Details Patient Name: Ann Cunningham Date of Service: 03/20/2017 12:45 PM Medical Record Patient Account Number: [id number] Number: Treating RN: Huel Coventry 01/02/1941 (76 y.o. Other Clinician: Date of Birth/Sex: Female) Treating ROBSON, MICHAEL Primary Care Adaline Trejos: Lanier Ensign Jennene Downie/Extender: G Referring Navarro Nine: Lanier Ensign Weeks in Treatment: 0 Active Problems Location of Pain Severity and  Description of Pain Patient Has Paino No Site Locations With Dressing Change: Yes Pain Management and Medication Current Pain Management: Goals for Pain Management Topical or injectable lidocaine is offered to patient for acute pain when surgical debridement is performed. If needed, Patient is instructed to use over the counter pain medication for the following 24-48 hours after debridement. Wound care MDs do not prescribed pain medications. Patient has chronic pain or uncontrolled pain. Patient has been instructed to make an appointment with their Primary Care Physician for pain management. Electronic Signature(s) Signed: 03/20/2017 4:42:12 PM By: Elliot Gurney, BSN, RN, CWS, Kim RN, BSN Entered By: Elliot Gurney, BSN, RN, CWS, Kim on 03/20/2017 12:56:48 Ann Cunningham ([id number]) -------------------------------------------------------------------------------- Patient/Caregiver Education Details Patient Name: Ann Cunningham Date of Service: 03/20/2017 12:45 PM Medical Record Patient Account Number: [id number] Number: Treating RN: Huel Coventry July 18, 1941 (76 y.o. Other Clinician: Date of Birth/Gender: Female) Treating ROBSON, MICHAEL Primary Care Physician: Lanier Ensign Physician/Extender: G Referring Physician: Vito Backers in Treatment: 0 Education Assessment Education Provided To: Patient Education Topics Provided Safety: Handouts: Safety Methods: Explain/Verbal Responses: State content correctly Welcome To The [facility]: Handouts: Welcome To The [facility] Methods: Demonstration Responses: State content correctly Wound/Skin Impairment: Handouts: Caring for Your Ulcer Methods: Demonstration Responses: State content  correctly Electronic Signature(s) Signed: 03/20/2017 4:42:12 PM By: Elliot Gurney, BSN, RN, CWS, Kim RN, BSN Entered By: Elliot Gurney, BSN, RN, CWS, Kim on 03/20/2017 13:56:11 Ann Cunningham  ([id number]) -------------------------------------------------------------------------------- Wound Assessment Details Patient Name: Ann Cunningham Date of Service: 03/20/2017 12:45 PM Medical Record Patient Account Number: [id number] Number: Treating RN: Huel Coventry 08-28-1941 (76 y.o. Other Clinician: Date of Birth/Sex: Female) Treating ROBSON, MICHAEL Primary Care Maebell Lyvers: Lanier Ensign Junior Kenedy/Extender: G Referring Rishan Oyama: Lanier Ensign Weeks in Treatment: 0 Wound Status Wound Number: 1 Primary 3rd degree Burn Etiology: Wound Location: Left Forearm Wound Open Wounding Event: Thermal Burn Status: Date Acquired: 07/09/2016 Comorbid Cataracts, Glaucoma, Hypertension, Weeks Of Treatment: 0 History: End Stage Renal Disease, History of Clustered Wound: No Burn, Osteoarthritis Photos Wound Measurements Length: (cm) 6.4 % Reduction in Ar Width: (cm) 5.5 % Reduction in Vo Depth: (cm) 0.1 Epithelialization Area: (cm) 27.646 Tunneling: Volume: (cm) 2.765 Undermining: ea: 0% lume: 0% : Small (1-33%) No No Wound Description Full Thickness Without Exposed Foul Odor After C Classification: Support Structures Slough/Fibrino Wound Margin: Flat and Intact Exudate Large Amount: Exudate Type: Serosanguineous Exudate Color: red, brown leansing: No Yes Wound Bed Granulation Amount: Medium (34-66%) Exposed Structure Granulation Quality: Red, Hyper-granulation Fascia Exposed: No Necrotic Amount: Medium (34-66%) Fat Layer (Subcutaneous Tissue) Exposed: Yes Moylan, Marylouise Stacks ([id number]) Necrotic Quality: Adherent Slough Tendon Exposed: No Muscle Exposed: No Joint Exposed: No Bone Exposed: No Periwound Skin Texture Texture Color No Abnormalities Noted: No No Abnormalities Noted: No Callus: No Atrophie Blanche: Yes Crepitus: No Cyanosis: No Excoriation: No Ecchymosis: No Induration: No Erythema: No Rash: No Hemosiderin Staining: No Scarring:  Yes Mottled: No Pallor: No Moisture Rubor: No No Abnormalities Noted: No Dry / Scaly: No Maceration: No Wound Preparation Ulcer Cleansing: Rinsed/Irrigated with Saline Topical Anesthetic Applied: None Treatment Notes Wound #1 (Left Forearm) 1. Cleansed with: Clean wound with Normal Saline 2. Anesthetic Topical Lidocaine 4% cream to wound bed prior to debridement 4. Dressing Applied: Hydrafera Blue 5. Secondary Dressing Applied ABD Pad Kerlix/Conform 7. Secured with Secretary/administrator) Signed: 03/20/2017 4:42:12 PM By: Elliot Gurney, BSN, RN, CWS, Kim RN, BSN Entered By: Elliot Gurney, BSN, RN, CWS, Kim on 03/20/2017 13:41:36 Ann Cunningham ([id number]) -------------------------------------------------------------------------------- Vitals Details Patient Name: Ann Cunningham Date of Service: 03/20/2017 12:45 PM Medical Record Patient Account Number: [id number] Number: Treating RN: Huel Coventry 1941-03-13 (76 y.o. Other Clinician: Date of Birth/Sex: Female) Treating ROBSON, MICHAEL Primary Care Erin Uecker: Lanier Ensign Jatavious Peppard/Extender: G Referring Jama Krichbaum: Lanier Ensign Weeks in Treatment: 0 Vital Signs Time Taken: 12:56 Temperature (F): 98.0 Height (in): 62 Pulse (bpm): 87 Weight (lbs): 145 Respiratory Rate (breaths/min): 16 Body Mass Index (BMI): 26.5 Blood Pressure (mmHg): 101/61 Reference Range: 80 - 120 mg / dl Electronic Signature(s) Signed: 03/20/2017 4:42:12 PM By: Elliot Gurney, BSN, RN, CWS, Kim RN, BSN Entered By: Elliot Gurney, BSN, RN, CWS, Kim on 03/20/2017 12:57:43
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
## 2017-03-27 ENCOUNTER — Ambulatory Visit: Payer: Medicare Other | Admitting: Internal Medicine

## 2017-04-03 ENCOUNTER — Ambulatory Visit: Payer: Medicare Other | Admitting: Physician Assistant

## 2017-04-11 ENCOUNTER — Encounter: Payer: Medicare Other | Attending: Internal Medicine | Admitting: Internal Medicine

## 2017-04-11 DIAGNOSIS — T22312D Burn of third degree of left forearm, subsequent encounter: Secondary | ICD-10-CM | POA: Diagnosis present

## 2017-04-11 DIAGNOSIS — X088XXD Exposure to other specified smoke, fire and flames, subsequent encounter: Secondary | ICD-10-CM | POA: Diagnosis not present

## 2017-04-11 DIAGNOSIS — H409 Unspecified glaucoma: Secondary | ICD-10-CM | POA: Insufficient documentation

## 2017-04-11 DIAGNOSIS — N185 Chronic kidney disease, stage 5: Secondary | ICD-10-CM | POA: Diagnosis not present

## 2017-04-11 DIAGNOSIS — I120 Hypertensive chronic kidney disease with stage 5 chronic kidney disease or end stage renal disease: Secondary | ICD-10-CM | POA: Insufficient documentation

## 2017-04-11 DIAGNOSIS — Z87891 Personal history of nicotine dependence: Secondary | ICD-10-CM | POA: Insufficient documentation
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
## 2017-04-13 NOTE — Progress Notes (Signed)
Ann Cunningham ([id number]) Visit Report for 04/11/2017 Arrival Information Details Patient Name: Ann Cunningham, Ann Cunningham Date of Service: 04/11/2017 3:30 PM Medical Record Patient Account Number: [id number] Number: Treating RN: Phillis Haggis 24-Sep-1941 (76 y.o. Other Clinician: Date of Birth/Sex: Female) Treating ROBSON, MICHAEL Primary Care Livio Ledwith: Lanier Ensign Kierstan Auer/Extender: G Referring Legend Pecore: Vito Backers in Treatment: 3 Visit Information History Since Last Visit All ordered tests and consults were completed: No Patient Arrived: Wheel Chair Added or deleted any medications: No Arrival Time: 15:35 Any new allergies or adverse reactions: No Accompanied By: self Had a fall or experienced change in No Transfer Assistance: None activities of daily living that may affect Patient Identification Verified: Yes risk of falls: Secondary Verification Process Yes Signs or symptoms of abuse/neglect since last No Completed: visito Patient Requires Transmission- No Hospitalized since last visit: No Based Precautions: Has Dressing in Place as Prescribed: Yes Patient Has Alerts: Yes Pain Present Now: No Patient Alerts: Patient on Blood Thinner Aspirin 81 MG NOT DIABETIC Electronic Signature(s) Signed: 04/11/2017 5:03:57 PM By: Alejandro Mulling Entered By: Alejandro Mulling on 04/11/2017 15:38:13 Ann Cunningham ([id number]) -------------------------------------------------------------------------------- Clinic Level of Care Assessment Details Patient Name: Ann Cunningham Date of Service: 04/11/2017 3:30 PM Medical Record Patient Account Number: [id number] Number: Treating RN: Phillis Haggis 1940/12/21 (76 y.o. Other Clinician: Date of Birth/Sex: Female) Treating ROBSON, MICHAEL Primary Care Korban Shearer: Lanier Ensign Kynnadi Dicenso/Extender: G Referring Elzia Hott: Lanier Ensign Weeks in Treatment: 3 Clinic Level of Care Assessment Items TOOL 4 Quantity  Score X - Use when only an EandM is performed on FOLLOW-UP visit 1 0 ASSESSMENTS - Nursing Assessment / Reassessment X - Reassessment of Co-morbidities (includes updates in patient status) 1 10 X - Reassessment of Adherence to Treatment Plan 1 5 ASSESSMENTS - Wound and Skin Assessment / Reassessment X - Simple Wound Assessment / Reassessment - one wound 1 5 []  - Complex Wound Assessment / Reassessment - multiple wounds 0 []  - Dermatologic / Skin Assessment (not related to wound area) 0 ASSESSMENTS - Focused Assessment []  - Circumferential Edema Measurements - multi extremities 0 []  - Nutritional Assessment / Counseling / Intervention 0 []  - Lower Extremity Assessment (monofilament, tuning fork, pulses) 0 []  - Peripheral Arterial Disease Assessment (using hand held doppler) 0 ASSESSMENTS - Ostomy and/or Continence Assessment and Care []  - Incontinence Assessment and Management 0 []  - Ostomy Care Assessment and Management (repouching, etc.) 0 PROCESS - Coordination of Care []  - Simple Patient / Family Education for ongoing care 0 X - Complex (extensive) Patient / Family Education for ongoing care 1 20 X - Staff obtains Chiropractor, Records, Test Results / Process Orders 1 10 X - Staff telephones HHA, Nursing Homes / Clarify orders / etc 1 10 [place], Emmilee ([id number]) []  - Routine Transfer to another Facility (non-emergent condition) 0 []  - Routine Hospital Admission (non-emergent condition) 0 []  - New Admissions / Manufacturing engineer / Ordering NPWT, Apligraf, etc. 0 []  - Emergency Hospital Admission (emergent condition) 0 X - Simple Discharge Coordination 1 10 []  - Complex (extensive) Discharge Coordination 0 PROCESS - Special Needs []  - Pediatric / Minor Patient Management 0 []  - Isolation Patient Management 0 []  - Hearing / Language / Visual special needs 0 []  - Assessment of Community assistance (transportation, D/C planning, etc.) 0 []  - Additional assistance / Altered mentation  0 []  - Support Surface(s) Assessment (bed, cushion, seat, etc.) 0 INTERVENTIONS - Wound Cleansing / Measurement X - Simple Wound Cleansing - one wound 1 5 []  - Complex  Wound Cleansing - multiple wounds 0 X - Wound Imaging (photographs - any number of wounds) 1 5 []  - Wound Tracing (instead of photographs) 0 X - Simple Wound Measurement - one wound 1 5 []  - Complex Wound Measurement - multiple wounds 0 INTERVENTIONS - Wound Dressings X - Small Wound Dressing one or multiple wounds 1 10 []  - Medium Wound Dressing one or multiple wounds 0 []  - Large Wound Dressing one or multiple wounds 0 X - Application of Medications - topical 1 5 []  - Application of Medications - injection 0 Sleep, Kalima ([id number]) INTERVENTIONS - Miscellaneous []  - External ear exam 0 []  - Specimen Collection (cultures, biopsies, blood, body fluids, etc.) 0 []  - Specimen(s) / Culture(s) sent or taken to Lab for analysis 0 []  - Patient Transfer (multiple staff / Michiel SitesHoyer Lift / Similar devices) 0 []  - Simple Staple / Suture removal (25 or less) 0 []  - Complex Staple / Suture removal (26 or more) 0 []  - Hypo / Hyperglycemic Management (close monitor of Blood Glucose) 0 []  - Ankle / Brachial Index (ABI) - do not check if billed separately 0 X - Vital Signs 1 5 Has the patient been seen at the hospital within the last three years: Yes Total Score: 105 Level Of Care: New/Established - Level 3 Electronic Signature(s) Signed: 04/11/2017 5:03:57 PM By: Alejandro MullingPinkerton, Debra Entered By: Alejandro MullingPinkerton, Debra on 04/11/2017 16:34:38 Ann CoolerSLADE, Analissa ([id number]) -------------------------------------------------------------------------------- Encounter Discharge Information Details Patient Name: Ann Cunningham Date of Service: 04/11/2017 3:30 PM Medical Record Patient Account Number: [id number] Number: Treating RN: Phillis Haggisinkerton, Debi 04/23/1941 (76 y.o. Other Clinician: Date of Birth/Sex: Female) Treating ROBSON, MICHAEL Primary  Care Ryszard Socarras: Lanier EnsignSOLES, MEREDITH Kevina Piloto/Extender: G Referring Natally Ribera: Vito BackersSOLES, MEREDITH Weeks in Treatment: 3 Encounter Discharge Information Items Discharge Pain Level: 0 Discharge Condition: Stable Ambulatory Status: Wheelchair Discharge Destination: Home Transportation: Other Accompanied By: self Schedule Follow-up Appointment: Yes Medication Reconciliation completed and provided to Patient/Care No Ishi Danser: Provided on Clinical Summary of Care: 04/11/2017 Form Type Recipient Paper Patient WS Electronic Signature(s) Signed: 04/11/2017 3:57:46 PM By: Gwenlyn PerkingMoore, Shelia Entered By: Gwenlyn PerkingMoore, Shelia on 04/11/2017 15:57:46 Ann CoolerSLADE, Miyuki ([id number]) -------------------------------------------------------------------------------- Lower Extremity Assessment Details Patient Name: Ann CoolerSLADE, Sunnie Date of Service: 04/11/2017 3:30 PM Medical Record Patient Account Number: [id number] Number: Treating RN: Phillis Haggisinkerton, Debi 04/23/1941 (76 y.o. Other Clinician: Date of Birth/Sex: Female) Treating ROBSON, MICHAEL Primary Care Luwanna Brossman: Lanier EnsignSOLES, MEREDITH Jarvin Ogren/Extender: G Referring Taeveon Keesling: Lanier EnsignSOLES, MEREDITH Weeks in Treatment: 3 Electronic Signature(s) Signed: 04/11/2017 5:03:57 PM By: Alejandro MullingPinkerton, Debra Entered By: Alejandro MullingPinkerton, Debra on 04/11/2017 15:48:12 Ann CoolerSLADE, Charline ([id number]) -------------------------------------------------------------------------------- Multi Wound Chart Details Patient Name: Ann CoolerSLADE, Deborra Date of Service: 04/11/2017 3:30 PM Medical Record Patient Account Number: [id number] Number: Treating RN: Phillis Haggisinkerton, Debi 04/23/1941 (76 y.o. Other Clinician: Date of Birth/Sex: Female) Treating ROBSON, MICHAEL Primary Care Lalania Haseman: Lanier EnsignSOLES, MEREDITH Norely Schlick/Extender: G Referring Mayzee Reichenbach: Lanier EnsignSOLES, MEREDITH Weeks in Treatment: 3 Vital Signs Height(in): 62 Pulse(bpm): 87 Weight(lbs): 145 Blood Pressure 116/59 (mmHg): Body Mass Index(BMI): 27 Temperature(F):  97.9 Respiratory Rate 16 (breaths/min): Photos: [1:No Photos] [N/A:N/A] Wound Location: [1:Left Forearm] [N/A:N/A] Wounding Event: [1:Thermal Burn] [N/A:N/A] Primary Etiology: [1:3rd degree Burn] [N/A:N/A] Comorbid History: [1:Cataracts, Glaucoma, Hypertension, End Stage Renal Disease, History of Burn, Osteoarthritis] [N/A:N/A] Date Acquired: [1:07/09/2016] [N/A:N/A] Weeks of Treatment: [1:3] [N/A:N/A] Wound Status: [1:Open] [N/A:N/A] Measurements L x W x D 4.5x7.5x0.1 [N/A:N/A] (cm) Area (cm) : [1:26.507] [N/A:N/A] Volume (cm) : [1:2.651] [N/A:N/A] % Reduction in Area: [1:4.10%] [N/A:N/A] % Reduction in Volume: 4.10% [N/A:N/A] Classification: [1:Full Thickness Without Exposed Support  Structures] [N/A:N/A] Exudate Amount: [1:Large] [N/A:N/A] Exudate Type: [1:Serosanguineous] [N/A:N/A] Exudate Color: [1:red, brown] [N/A:N/A] Wound Margin: [1:Flat and Intact] [N/A:N/A] Granulation Amount: [1:Large (67-100%)] [N/A:N/A] Granulation Quality: [1:Red, Hyper-granulation] [N/A:N/A] Necrotic Amount: [1:Small (1-33%)] [N/A:N/A] Exposed Structures: [N/A:N/A] Fat Layer (Subcutaneous Tissue) Exposed: Yes Fascia: No Tendon: No Muscle: No Joint: No Bone: No Epithelialization: Small (1-33%) N/A N/A Periwound Skin Texture: Scarring: Yes N/A N/A Excoriation: No Induration: No Callus: No Crepitus: No Rash: No Periwound Skin Maceration: No N/A N/A Moisture: Dry/Scaly: No Periwound Skin Color: Atrophie Blanche: Yes N/A N/A Cyanosis: No Ecchymosis: No Erythema: No Hemosiderin Staining: No Mottled: No Pallor: No Rubor: No Tenderness on No N/A N/A Palpation: Wound Preparation: Ulcer Cleansing: N/A N/A Rinsed/Irrigated with Saline Topical Anesthetic Applied: None Treatment Notes Electronic Signature(s) Signed: 04/11/2017 5:03:57 PM By: Alejandro Mulling Entered By: Alejandro Mulling on 04/11/2017 15:48:31 Ann Cunningham  ([id number]) -------------------------------------------------------------------------------- Multi-Disciplinary Care Plan Details Patient Name: Ann Cunningham Date of Service: 04/11/2017 3:30 PM Medical Record Patient Account Number: [id number] Number: Treating RN: Phillis Haggis 1940-12-03 (76 y.o. Other Clinician: Date of Birth/Sex: Female) Treating ROBSON, MICHAEL Primary Care Eira Alpert: Lanier Ensign Matisse Roskelley/Extender: G Referring Caelin Rayl: Lanier Ensign Weeks in Treatment: 3 Active Inactive ` Orientation to the Wound Care Program Nursing Diagnoses: Knowledge deficit related to the wound healing center program Goals: Patient/caregiver will verbalize understanding of the Wound Healing Center Program Date Initiated: 03/20/2017 Target Resolution Date: 03/24/2017 Goal Status: Active Interventions: Provide education on orientation to the wound center Notes: ` Wound/Skin Impairment Nursing Diagnoses: Impaired tissue integrity Goals: Ulcer/skin breakdown will heal within 14 weeks Date Initiated: 03/20/2017 Target Resolution Date: 06/20/2017 Goal Status: Active Interventions: Assess patient/caregiver ability to obtain necessary supplies Assess ulceration(s) every visit Treatment Activities: Skin care regimen initiated : 03/20/2017 Notes: SUI, KASPAREK ([id number]) Electronic Signature(s) Signed: 04/11/2017 5:03:57 PM By: Alejandro Mulling Entered By: Alejandro Mulling on 04/11/2017 15:48:16 Ann Cunningham ([id number]) -------------------------------------------------------------------------------- Pain Assessment Details Patient Name: Ann Cunningham Date of Service: 04/11/2017 3:30 PM Medical Record Patient Account Number: [id number] Number: Treating RN: Phillis Haggis Jun 21, 1941 (76 y.o. Other Clinician: Date of Birth/Sex: Female) Treating ROBSON, MICHAEL Primary Care Jackelynn Hosie: Lanier Ensign Matisyn Cabeza/Extender: G Referring Marlos Carmen: Lanier Ensign Weeks  in Treatment: 3 Active Problems Location of Pain Severity and Description of Pain Patient Has Paino No Site Locations With Dressing Change: No Pain Management and Medication Current Pain Management: Electronic Signature(s) Signed: 04/11/2017 5:03:57 PM By: Alejandro Mulling Entered By: Alejandro Mulling on 04/11/2017 15:38:19 Ann Cunningham ([id number]) -------------------------------------------------------------------------------- Patient/Caregiver Education Details Patient Name: Ann Cunningham Date of Service: 04/11/2017 3:30 PM Medical Record Patient Account Number: [id number] Number: Treating RN: Phillis Haggis 03/17/1941 (76 y.o. Other Clinician: Date of Birth/Gender: Female) Treating ROBSON, MICHAEL Primary Care Physician: Lanier Ensign Physician/Extender: G Referring Physician: Vito Backers in Treatment: 3 Education Assessment Education Provided To: Patient Education Topics Provided Wound/Skin Impairment: Handouts: Other: change dressing as ordered Methods: Demonstration, Explain/Verbal Responses: State content correctly Electronic Signature(s) Signed: 04/11/2017 5:03:57 PM By: Alejandro Mulling Entered By: Alejandro Mulling on 04/11/2017 15:49:28 Ann Cunningham ([id number]) -------------------------------------------------------------------------------- Wound Assessment Details Patient Name: Ann Cunningham Date of Service: 04/11/2017 3:30 PM Medical Record Patient Account Number: [id number] Number: Treating RN: Phillis Haggis 21-Mar-1941 (76 y.o. Other Clinician: Date of Birth/Sex: Female) Treating ROBSON, MICHAEL Primary Care Gunnison Chahal: Lanier Ensign Jeliyah Middlebrooks/Extender: G Referring Kirstine Jacquin: Lanier Ensign Weeks in Treatment: 3 Wound Status Wound Number: 1 Primary 3rd degree Burn Etiology: Wound Location: Left Forearm Wound Open Wounding Event: Thermal Burn Status: Date Acquired: 07/09/2016 Comorbid Cataracts, Glaucoma,  Hypertension, Weeks Of Treatment: 3 History: End Stage Renal Disease, History of Clustered Wound: No Burn, Osteoarthritis Photos Photo Uploaded By: Alejandro Mulling on 04/11/2017 16:41:43 Wound Measurements Length: (cm) 4.5 Width: (cm) 7.5 Depth: (cm) 0.1 Area: (cm) 26.507 Volume: (cm) 2.651 % Reduction in Area: 4.1% % Reduction in Volume: 4.1% Epithelialization: Small (1-33%) Tunneling: No Undermining: No Wound Description Full Thickness Without Exposed Classification: Support Structures Wound Margin: Flat and Intact Exudate Large Amount: Exudate Type: Serosanguineous Exudate Color: red, brown Foul Odor After Cleansing: No Slough/Fibrino Yes Wound Bed Reha, Tashara ([id number]) Granulation Amount: Large (67-100%) Exposed Structure Granulation Quality: Red, Hyper-granulation Fascia Exposed: No Necrotic Amount: Small (1-33%) Fat Layer (Subcutaneous Tissue) Exposed: Yes Necrotic Quality: Adherent Slough Tendon Exposed: No Muscle Exposed: No Joint Exposed: No Bone Exposed: No Periwound Skin Texture Texture Color No Abnormalities Noted: No No Abnormalities Noted: No Callus: No Atrophie Blanche: Yes Crepitus: No Cyanosis: No Excoriation: No Ecchymosis: No Induration: No Erythema: No Rash: No Hemosiderin Staining: No Scarring: Yes Mottled: No Pallor: No Moisture Rubor: No No Abnormalities Noted: No Dry / Scaly: No Maceration: No Wound Preparation Ulcer Cleansing: Rinsed/Irrigated with Saline Topical Anesthetic Applied: None Treatment Notes Wound #1 (Left Forearm) 1. Cleansed with: Clean wound with Normal Saline 4. Dressing Applied: Xeroform 5. Secondary Dressing Applied ABD Pad Kerlix/Conform 7. Secured with Secretary/administrator) Signed: 04/11/2017 5:03:57 PM By: Alejandro Mulling Entered By: Alejandro Mulling on 04/11/2017 15:44:16 Ann Cunningham  ([id number]) -------------------------------------------------------------------------------- Vitals Details Patient Name: Ann Cunningham Date of Service: 04/11/2017 3:30 PM Medical Record Patient Account Number: [id number] Number: Treating RN: Phillis Haggis 26-Apr-1941 (76 y.o. Other Clinician: Date of Birth/Sex: Female) Treating ROBSON, MICHAEL Primary Care Jovanni Eckhart: Lanier Ensign Baylen Buckner/Extender: G Referring Bode Pieper: Lanier Ensign Weeks in Treatment: 3 Vital Signs Time Taken: 15:38 Temperature (F): 97.9 Height (in): 62 Pulse (bpm): 87 Weight (lbs): 145 Respiratory Rate (breaths/min): 16 Body Mass Index (BMI): 26.5 Blood Pressure (mmHg): 116/59 Reference Range: 80 - 120 mg / dl Electronic Signature(s) Signed: 04/11/2017 5:03:57 PM By: Alejandro Mulling Entered By: Alejandro Mulling on 04/11/2017 15:39:16

## 2017-04-25 ENCOUNTER — Ambulatory Visit: Payer: Medicare Other | Admitting: Internal Medicine

## 2017-04-29 ENCOUNTER — Emergency Department
Admission: EM | Admit: 2017-04-29 | Discharge: 2017-04-29 | Disposition: A | Discharge status: Home / Self Care | Payer: Medicare Other | Attending: Emergency Medicine | Admitting: Emergency Medicine

## 2017-04-29 ENCOUNTER — Encounter: Payer: Self-pay | Admitting: Emergency Medicine

## 2017-04-29 ENCOUNTER — Emergency Department: Payer: Medicare Other

## 2017-04-29 DIAGNOSIS — E039 Hypothyroidism, unspecified: Secondary | ICD-10-CM | POA: Insufficient documentation

## 2017-04-29 DIAGNOSIS — S43101A Unspecified dislocation of right acromioclavicular joint, initial encounter: Secondary | ICD-10-CM

## 2017-04-29 DIAGNOSIS — F17290 Nicotine dependence, other tobacco product, uncomplicated: Secondary | ICD-10-CM | POA: Diagnosis not present

## 2017-04-29 DIAGNOSIS — Y999 Unspecified external cause status: Secondary | ICD-10-CM | POA: Insufficient documentation

## 2017-04-29 DIAGNOSIS — I10 Essential (primary) hypertension: Secondary | ICD-10-CM | POA: Diagnosis not present

## 2017-04-29 DIAGNOSIS — Z7982 Long term (current) use of aspirin: Secondary | ICD-10-CM | POA: Diagnosis not present

## 2017-04-29 DIAGNOSIS — Z79899 Other long term (current) drug therapy: Secondary | ICD-10-CM | POA: Diagnosis not present

## 2017-04-29 DIAGNOSIS — S43111A Subluxation of right acromioclavicular joint, initial encounter: Secondary | ICD-10-CM | POA: Insufficient documentation

## 2017-04-29 DIAGNOSIS — Y939 Activity, unspecified: Secondary | ICD-10-CM | POA: Insufficient documentation

## 2017-04-29 DIAGNOSIS — S40911A Unspecified superficial injury of right shoulder, initial encounter: Secondary | ICD-10-CM | POA: Diagnosis present

## 2017-04-29 DIAGNOSIS — S46001A Unspecified injury of muscle(s) and tendon(s) of the rotator cuff of right shoulder, initial encounter: Secondary | ICD-10-CM

## 2017-04-29 DIAGNOSIS — Y929 Unspecified place or not applicable: Secondary | ICD-10-CM | POA: Insufficient documentation

## 2017-04-29 DIAGNOSIS — M19011 Primary osteoarthritis, right shoulder: Secondary | ICD-10-CM

## 2017-04-29 MED ORDER — DEXAMETHASONE SODIUM PHOSPHATE 10 MG/ML IJ SOLN
10.0000 mg | Freq: Once | INTRAMUSCULAR | Status: AC
  Administered 2017-04-29: 10 mg via INTRAVENOUS
  Filled 2017-04-29: qty 1

## 2017-04-29 MED ORDER — PREDNISONE 10 MG PO TABS: 10.0000 mg | ORAL_TABLET | Freq: Every day | ORAL | 0 refills | Status: DC
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
## 2017-05-02 ENCOUNTER — Encounter: Payer: Medicare Other | Attending: Physician Assistant | Admitting: Physician Assistant

## 2017-05-02 DIAGNOSIS — X088XXD Exposure to other specified smoke, fire and flames, subsequent encounter: Secondary | ICD-10-CM | POA: Diagnosis not present

## 2017-05-02 DIAGNOSIS — H409 Unspecified glaucoma: Secondary | ICD-10-CM | POA: Diagnosis not present

## 2017-05-02 DIAGNOSIS — Z87891 Personal history of nicotine dependence: Secondary | ICD-10-CM | POA: Diagnosis not present

## 2017-05-02 DIAGNOSIS — N185 Chronic kidney disease, stage 5: Secondary | ICD-10-CM | POA: Diagnosis not present

## 2017-05-02 DIAGNOSIS — I120 Hypertensive chronic kidney disease with stage 5 chronic kidney disease or end stage renal disease: Secondary | ICD-10-CM | POA: Insufficient documentation

## 2017-05-02 DIAGNOSIS — T22312D Burn of third degree of left forearm, subsequent encounter: Secondary | ICD-10-CM | POA: Insufficient documentation
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
## 2017-05-11 ENCOUNTER — Ambulatory Visit: Payer: Medicare Other | Admitting: Physician Assistant

## 2017-06-05 ENCOUNTER — Ambulatory Visit: Payer: Medicare Other | Admitting: Internal Medicine

## 2017-08-13 ENCOUNTER — Other Ambulatory Visit: Payer: Self-pay

## 2017-08-13 ENCOUNTER — Observation Stay
Admission: EM | Admit: 2017-08-13 | Discharge: 2017-08-15 | Disposition: A | Discharge status: Home / Self Care | Payer: Medicare Other | Source: Home / Self Care | Attending: Emergency Medicine | Admitting: Emergency Medicine

## 2017-08-13 ENCOUNTER — Encounter: Payer: Self-pay | Admitting: *Deleted

## 2017-08-13 DIAGNOSIS — I4581 Long QT syndrome: Secondary | ICD-10-CM

## 2017-08-13 DIAGNOSIS — I132 Hypertensive heart and chronic kidney disease with heart failure and with stage 5 chronic kidney disease, or end stage renal disease: Secondary | ICD-10-CM | POA: Diagnosis not present

## 2017-08-13 DIAGNOSIS — Z992 Dependence on renal dialysis: Secondary | ICD-10-CM

## 2017-08-13 DIAGNOSIS — E039 Hypothyroidism, unspecified: Secondary | ICD-10-CM | POA: Insufficient documentation

## 2017-08-13 DIAGNOSIS — I509 Heart failure, unspecified: Secondary | ICD-10-CM

## 2017-08-13 DIAGNOSIS — Z23 Encounter for immunization: Secondary | ICD-10-CM | POA: Insufficient documentation

## 2017-08-13 DIAGNOSIS — I5033 Acute on chronic diastolic (congestive) heart failure: Secondary | ICD-10-CM | POA: Insufficient documentation

## 2017-08-13 DIAGNOSIS — N2581 Secondary hyperparathyroidism of renal origin: Secondary | ICD-10-CM | POA: Insufficient documentation

## 2017-08-13 DIAGNOSIS — E785 Hyperlipidemia, unspecified: Secondary | ICD-10-CM | POA: Insufficient documentation

## 2017-08-13 DIAGNOSIS — N186 End stage renal disease: Secondary | ICD-10-CM | POA: Insufficient documentation

## 2017-08-13 DIAGNOSIS — J811 Chronic pulmonary edema: Secondary | ICD-10-CM | POA: Insufficient documentation

## 2017-08-13 DIAGNOSIS — D631 Anemia in chronic kidney disease: Secondary | ICD-10-CM

## 2017-08-13 DIAGNOSIS — Z7982 Long term (current) use of aspirin: Secondary | ICD-10-CM | POA: Insufficient documentation

## 2017-08-13 DIAGNOSIS — J9600 Acute respiratory failure, unspecified whether with hypoxia or hypercapnia: Secondary | ICD-10-CM

## 2017-08-13 DIAGNOSIS — J90 Pleural effusion, not elsewhere classified: Secondary | ICD-10-CM

## 2017-08-13 DIAGNOSIS — N179 Acute kidney failure, unspecified: Secondary | ICD-10-CM

## 2017-08-13 DIAGNOSIS — Z79899 Other long term (current) drug therapy: Secondary | ICD-10-CM

## 2017-08-13 DIAGNOSIS — Z87891 Personal history of nicotine dependence: Secondary | ICD-10-CM | POA: Insufficient documentation

## 2017-08-13 DIAGNOSIS — R0600 Dyspnea, unspecified: Secondary | ICD-10-CM | POA: Diagnosis present

## 2017-08-13 DIAGNOSIS — R531 Weakness: Secondary | ICD-10-CM | POA: Diagnosis not present

## 2017-08-13 DIAGNOSIS — R634 Abnormal weight loss: Secondary | ICD-10-CM | POA: Insufficient documentation

## 2017-08-13 DIAGNOSIS — R0602 Shortness of breath: Secondary | ICD-10-CM

## 2017-08-13 HISTORY — DX: Heart failure, unspecified: I50.9

## 2017-08-13 HISTORY — DX: Chronic kidney disease, unspecified: N18.9

## 2017-08-13 NOTE — ED Triage Notes (Signed)
Pt brought in from ems from home with sob.   No chest pain  Hx htn, chf.  Pt alert.
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
## 2017-08-13 NOTE — ED Notes (Signed)
Pt reports ob for 1 week.  Sx worse tonight   No chest pain.  No cough.  No fever.   No n/v/d.  Pt also has swelling to both lower legs.

## 2017-08-14 ENCOUNTER — Observation Stay: Payer: Medicare Other

## 2017-08-14 ENCOUNTER — Emergency Department: Payer: Medicare Other

## 2017-08-14 ENCOUNTER — Other Ambulatory Visit: Payer: Self-pay

## 2017-08-14 ENCOUNTER — Observation Stay (HOSPITAL_BASED_OUTPATIENT_CLINIC_OR_DEPARTMENT_OTHER)
Admit: 2017-08-14 | Discharge: 2017-08-14 | Disposition: A | Discharge status: Home / Self Care | Payer: Medicare Other | Attending: Internal Medicine | Admitting: Internal Medicine

## 2017-08-14 DIAGNOSIS — D631 Anemia in chronic kidney disease: Secondary | ICD-10-CM | POA: Diagnosis present

## 2017-08-14 DIAGNOSIS — R0600 Dyspnea, unspecified: Secondary | ICD-10-CM | POA: Diagnosis present

## 2017-08-14 DIAGNOSIS — N186 End stage renal disease: Secondary | ICD-10-CM | POA: Diagnosis present

## 2017-08-14 DIAGNOSIS — E039 Hypothyroidism, unspecified: Secondary | ICD-10-CM | POA: Diagnosis present

## 2017-08-14 DIAGNOSIS — Z6823 Body mass index (BMI) 23.0-23.9, adult: Secondary | ICD-10-CM

## 2017-08-14 DIAGNOSIS — I429 Cardiomyopathy, unspecified: Secondary | ICD-10-CM | POA: Diagnosis present

## 2017-08-14 DIAGNOSIS — I132 Hypertensive heart and chronic kidney disease with heart failure and with stage 5 chronic kidney disease, or end stage renal disease: Principal | ICD-10-CM | POA: Diagnosis present

## 2017-08-14 DIAGNOSIS — I5022 Chronic systolic (congestive) heart failure: Secondary | ICD-10-CM | POA: Diagnosis present

## 2017-08-14 DIAGNOSIS — R627 Adult failure to thrive: Secondary | ICD-10-CM | POA: Diagnosis present

## 2017-08-14 DIAGNOSIS — Z87891 Personal history of nicotine dependence: Secondary | ICD-10-CM

## 2017-08-14 DIAGNOSIS — J95811 Postprocedural pneumothorax: Secondary | ICD-10-CM | POA: Diagnosis not present

## 2017-08-14 DIAGNOSIS — Z79890 Hormone replacement therapy (postmenopausal): Secondary | ICD-10-CM

## 2017-08-14 DIAGNOSIS — E43 Unspecified severe protein-calorie malnutrition: Secondary | ICD-10-CM | POA: Diagnosis present

## 2017-08-14 DIAGNOSIS — Z7982 Long term (current) use of aspirin: Secondary | ICD-10-CM

## 2017-08-14 DIAGNOSIS — I5031 Acute diastolic (congestive) heart failure: Secondary | ICD-10-CM | POA: Diagnosis not present

## 2017-08-14 DIAGNOSIS — Z66 Do not resuscitate: Secondary | ICD-10-CM | POA: Diagnosis present

## 2017-08-14 DIAGNOSIS — J9600 Acute respiratory failure, unspecified whether with hypoxia or hypercapnia: Secondary | ICD-10-CM | POA: Diagnosis present

## 2017-08-14 DIAGNOSIS — N179 Acute kidney failure, unspecified: Secondary | ICD-10-CM | POA: Diagnosis present

## 2017-08-14 DIAGNOSIS — Z992 Dependence on renal dialysis: Secondary | ICD-10-CM

## 2017-08-14 DIAGNOSIS — N2581 Secondary hyperparathyroidism of renal origin: Secondary | ICD-10-CM | POA: Diagnosis present

## 2017-08-14 DIAGNOSIS — Y838 Other surgical procedures as the cause of abnormal reaction of the patient, or of later complication, without mention of misadventure at the time of the procedure: Secondary | ICD-10-CM | POA: Diagnosis not present

## 2017-08-14 DIAGNOSIS — J918 Pleural effusion in other conditions classified elsewhere: Secondary | ICD-10-CM | POA: Diagnosis present

## 2017-08-14 LAB — PROTIME-INR
INR: 1.21
Prothrombin Time: 15.2 seconds (ref 11.4–15.2)

## 2017-08-14 LAB — BASIC METABOLIC PANEL
Anion gap: 10 (ref 5–15)
BUN: 58 mg/dL — ABNORMAL HIGH (ref 6–20)
CHLORIDE: 111 mmol/L (ref 101–111)
CO2: 21 mmol/L — ABNORMAL LOW (ref 22–32)
Calcium: 8.6 mg/dL — ABNORMAL LOW (ref 8.9–10.3)
Creatinine, Ser: 4.82 mg/dL — ABNORMAL HIGH (ref 0.44–1.00)
GFR calc non Af Amer: 8 mL/min — ABNORMAL LOW (ref >60)
GFR, EST AFRICAN AMERICAN: 9 mL/min — AB (ref >60)
Glucose, Bld: 104 mg/dL — ABNORMAL HIGH (ref 65–99)
POTASSIUM: 5.2 mmol/L — AB (ref 3.5–5.1)
SODIUM: 142 mmol/L (ref 135–145)

## 2017-08-14 LAB — CBC
HCT: 31.7 % — ABNORMAL LOW (ref 35.0–47.0)
Hemoglobin: 10.2 g/dL — ABNORMAL LOW (ref 12.0–16.0)
MCH: 28.6 pg (ref 26.0–34.0)
MCHC: 32.2 g/dL (ref 32.0–36.0)
MCV: 88.8 fL (ref 80.0–100.0)
PLATELETS: 187 10*3/uL (ref 150–440)
RBC: 3.57 MIL/uL — AB (ref 3.80–5.20)
RDW: 15.2 % — ABNORMAL HIGH (ref 11.5–14.5)
WBC: 4.7 10*3/uL (ref 3.6–11.0)

## 2017-08-14 LAB — BRAIN NATRIURETIC PEPTIDE: B NATRIURETIC PEPTIDE 5: 816 pg/mL — AB (ref 0.0–100.0)

## 2017-08-14 LAB — TROPONIN I

## 2017-08-14 LAB — TSH: TSH: 5.704 u[IU]/mL — ABNORMAL HIGH (ref 0.350–4.500)

## 2017-08-14 LAB — MAGNESIUM: MAGNESIUM: 2.3 mg/dL (ref 1.7–2.4)

## 2017-08-14 MED ORDER — HEPARIN SODIUM (PORCINE) 5000 UNIT/ML IJ SOLN
5000.0000 [IU] | Freq: Three times a day (TID) | INTRAMUSCULAR | Status: DC
  Administered 2017-08-14 – 2017-08-15 (×3): 5000 [IU] via SUBCUTANEOUS
  Filled 2017-08-14 (×3): qty 1

## 2017-08-14 MED ORDER — FUROSEMIDE 10 MG/ML IJ SOLN: 40.0000 mg | Freq: Every day | INTRAMUSCULAR | Status: DC

## 2017-08-14 MED ORDER — FUROSEMIDE 10 MG/ML IJ SOLN
8.0000 mg/h | INTRAVENOUS | Status: DC
  Administered 2017-08-14: 16:00:00 8 mg/h via INTRAVENOUS
  Filled 2017-08-14: qty 25

## 2017-08-14 MED ORDER — ONDANSETRON HCL 4 MG PO TABS: 4.0000 mg | ORAL_TABLET | Freq: Four times a day (QID) | ORAL | Status: DC | PRN

## 2017-08-14 MED ORDER — LEVOTHYROXINE SODIUM 25 MCG PO TABS
25.0000 ug | ORAL_TABLET | Freq: Every day | ORAL | Status: DC
  Administered 2017-08-14 – 2017-08-15 (×2): 25 ug via ORAL
  Filled 2017-08-14 (×2): qty 1

## 2017-08-14 MED ORDER — FUROSEMIDE 10 MG/ML IJ SOLN
80.0000 mg | Freq: Once | INTRAMUSCULAR | Status: AC
  Administered 2017-08-14: 80 mg via INTRAVENOUS
  Filled 2017-08-14: qty 8

## 2017-08-14 MED ORDER — ACETAMINOPHEN 325 MG PO TABS: 650.0000 mg | ORAL_TABLET | Freq: Four times a day (QID) | ORAL | Status: DC | PRN

## 2017-08-14 MED ORDER — AMLODIPINE BESYLATE 5 MG PO TABS
5.0000 mg | ORAL_TABLET | Freq: Every day | ORAL | Status: DC
  Administered 2017-08-14 – 2017-08-15 (×2): 5 mg via ORAL
  Filled 2017-08-14 (×2): qty 1

## 2017-08-14 MED ORDER — INFLUENZA VAC SPLIT HIGH-DOSE 0.5 ML IM SUSY
0.5000 mL | PREFILLED_SYRINGE | INTRAMUSCULAR | Status: DC
  Filled 2017-08-14: qty 0.5

## 2017-08-14 MED ORDER — FUROSEMIDE 10 MG/ML IJ SOLN: 40.0000 mg | Freq: Once | INTRAMUSCULAR | Status: DC

## 2017-08-14 MED ORDER — LABETALOL HCL 5 MG/ML IV SOLN: 5.0000 mg | INTRAVENOUS | Status: DC | PRN

## 2017-08-14 MED ORDER — ACETAMINOPHEN 650 MG RE SUPP: 650.0000 mg | Freq: Four times a day (QID) | RECTAL | Status: DC | PRN

## 2017-08-14 MED ORDER — PRAVASTATIN SODIUM 20 MG PO TABS
20.0000 mg | ORAL_TABLET | Freq: Every day | ORAL | Status: DC
  Administered 2017-08-14: 17:00:00 20 mg via ORAL
  Filled 2017-08-14: qty 1

## 2017-08-14 MED ORDER — ONDANSETRON HCL 4 MG/2ML IJ SOLN: 4.0000 mg | Freq: Four times a day (QID) | INTRAMUSCULAR | Status: DC | PRN

## 2017-08-14 MED ORDER — NITROGLYCERIN 2 % TD OINT
0.5000 [in_us] | TOPICAL_OINTMENT | Freq: Four times a day (QID) | TRANSDERMAL | Status: DC
  Administered 2017-08-14 – 2017-08-15 (×4): 0.5 [in_us] via TOPICAL
  Filled 2017-08-14 (×4): qty 1

## 2017-08-14 MED ORDER — DOCUSATE SODIUM 100 MG PO CAPS
100.0000 mg | ORAL_CAPSULE | Freq: Two times a day (BID) | ORAL | Status: DC
  Administered 2017-08-14 – 2017-08-15 (×3): 100 mg via ORAL
  Filled 2017-08-14 (×3): qty 1
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
## 2017-08-14 NOTE — Progress Notes (Signed)
NPO after midnight for peritoneal dialysis catheter placement,lasix drip infusing,worsening kidney function now requiring dialysis.Thoracentesis done today and 800 ml fluid removed.

## 2017-08-14 NOTE — ED Notes (Signed)
meds given  External female catheter applied.

## 2017-08-14 NOTE — Consult Note (Signed)
[facility] CONSULT NOTE    Date: 08/14/2017                  Patient Name:  Ann Cunningham  MRN: [id number]  DOB: 1941/08/10  Age / Sex: 76 y.o., female         PCP: [facility]                 Service Requesting Consult: Hospitalist                 Reason for Consult: CKD stage V            History of Present Illness: Patient is a 76 y.o. female with a PMHx of chronic kidney disease stage V, congestive heart failure, hypertension, anemia chronic kidney disease, secondary hyperparathyroidism, who was admitted to [facility] on 08/13/2017 for evaluation of increasing shortness of breath and lower extremity edema.  Patient states that over the past several weeks she's had gradual increasing shortness of breath as well as lower extremity edema.  She was previously following with [facility] nephrology.  She last saw them in April of 2017.  She had a number of ollowup appointment scheduled for her however she did not make any of these appointments.  She was then advised that she would need to seek alternative nephrology care per her report.  Patient has been admitted under observation status.   Medications: Outpatient medications: Medications Prior to Admission  Medication Sig Dispense Refill Last Dose  . aspirin 81 MG tablet Take 81 mg by mouth daily.   Past Week at Unknown time  . diclofenac sodium (VOLTAREN) 1 % GEL Apply 2 g topically 4 (four) times daily. 100 g 0 prn at prn  . furosemide (LASIX) 20 MG tablet Take 20 mg by mouth daily.    Past Week at Unknown time  . levothyroxine (SYNTHROID, LEVOTHROID) 25 MCG tablet Take 25 mcg by mouth daily before breakfast.   Past Week at Unknown time  . SIMBRINZA 1-0.2 % SUSP Place 1 drop into both eyes daily as needed.  0 prn at prn  . sodium bicarbonate 650 MG tablet Take 650 mg by mouth 3 (three) times daily.   Past Week at Unknown time    Current medications: Current Facility-Administered Medications  Medication  Dose Route Frequency Provider Last Rate Last Dose  . acetaminophen (TYLENOL) tablet 650 mg  650 mg Oral Q6H PRN Arnaldo Natal, MD       Or  . acetaminophen (TYLENOL) suppository 650 mg  650 mg Rectal Q6H PRN Arnaldo Natal, MD      . amLODipine (NORVASC) tablet 5 mg  5 mg Oral Daily Enid Baas, MD   5 mg at 08/14/17 1304  . docusate sodium (COLACE) capsule 100 mg  100 mg Oral BID Arnaldo Natal, MD   100 mg at 08/14/17 1006  . furosemide (LASIX) 250 mg in dextrose 5 % 250 mL (1 mg/mL) infusion  8 mg/hr Intravenous Continuous Enid Baas, MD 8 mL/hr at 08/14/17 1601 8 mg/hr at 08/14/17 1601  . heparin injection 5,000 Units  5,000 Units Subcutaneous Q8H Enid Baas, MD      . Melene Muller ON 08/15/2017] Influenza vac split quadrivalent PF (FLUZONE HIGH-DOSE) injection 0.5 mL  0.5 mL Intramuscular Tomorrow-1000 Arnaldo Natal, MD      . labetalol (NORMODYNE,TRANDATE) injection 5-10 mg  5-10 mg Intravenous Q2H PRN Arnaldo Natal, MD      . levothyroxine (  SYNTHROID, LEVOTHROID) tablet 25 mcg  25 mcg Oral QAC breakfast Arnaldo Nataliamond, Michael S, MD   25 mcg at 08/14/17 1005  . nitroGLYCERIN (NITROGLYN) 2 % ointment 0.5 inch  0.5 inch Topical Q6H Arnaldo Nataliamond, Michael S, MD      . ondansetron [facility](ZOFRAN) tablet 4 mg  4 mg Oral Q6H PRN Arnaldo Nataliamond, Michael S, MD       Or  . ondansetron [facility](ZOFRAN) injection 4 mg  4 mg Intravenous Q6H PRN Arnaldo Nataliamond, Michael S, MD      . pravastatin (PRAVACHOL) tablet 20 mg  20 mg Oral Daily Arnaldo Nataliamond, Michael S, MD          Allergies: No Known Allergies    Past Medical History: Past Medical History:  Diagnosis Date  . CHF (congestive heart failure) (HCC)   . CKD (chronic kidney disease)   . Hypertension   . Hypothyroidism   . Renal disorder   . Uses walker      Past Surgical History: Past Surgical History:  Procedure Laterality Date  . CATARACT EXTRACTION W/ INTRAOCULAR LENS IMPLANT    . CATARACT EXTRACTION W/PHACO Right 12/25/2016    Procedure: CATARACT EXTRACTION PHACO AND INTRAOCULAR LENS PLACEMENT (IOC)  Right complicated;  Surgeon: Sherald HessAnita Prakash Vin-Parikh, MD;  [facility];  Service: Ophthalmology;  Laterality: Right;  Vision Blue     Family History: Family History  Problem Relation Age of Onset  . Hypertension Mother      Social History: Social History   Socioeconomic History  . Marital status: Single    Spouse name: Not on file  . Number of children: Not on file  . Years of education: Not on file  . Highest education level: Not on file  Social Needs  . Financial resource strain: Patient refused  . Food insecurity - worry: Patient refused  . Food insecurity - inability: Patient refused  . Transportation needs - medical: Patient refused  . Transportation needs - non-medical: Patient refused  Occupational History  . Not on file  Tobacco Use  . Smoking status: Former Smoker    Packs/day: 0.50    Types: Cigarettes    Last attempt to quit: 06/14/2017    Years since quitting: 0.1  . Smokeless tobacco: Current User    Types: Snuff  Substance and Sexual Activity  . Alcohol use: No  . Drug use: No  . Sexual activity: Not on file  Other Topics Concern  . Not on file  Social History Narrative  . Not on file     Review of Systems: Review of Systems  Constitutional: Positive for malaise/fatigue and weight loss. Negative for chills and fever.  HENT: Negative for hearing loss, nosebleeds and tinnitus.   Eyes: Negative for blurred vision and double vision.  Respiratory: Positive for cough and shortness of breath.   Cardiovascular: Positive for orthopnea and leg swelling. Negative for chest pain.  Gastrointestinal: Positive for nausea. Negative for abdominal pain, heartburn and vomiting.  Genitourinary: Negative for dysuria, frequency and urgency.  Musculoskeletal: Negative for back pain and myalgias.  Skin: Negative for itching and rash.  Neurological: Positive for weakness.  Negative for dizziness and focal weakness.  Endo/Heme/Allergies: Negative for polydipsia. Does not bruise/bleed easily.  Psychiatric/Behavioral: Negative for depression and memory loss.    Vital Signs: Blood pressure (!) 147/68, pulse 88, temperature 98 F (36.7 C), resp. rate 20, height 5\' 2"  (1.575 m), weight 63.2 kg (139 lb 6.4 oz), SpO2 97 %.  Weight trends: [employer]  08/13/17 2350 08/14/17 0511  Weight: 59 kg (130 lb) 63.2 kg (139 lb 6.4 oz)    Physical Exam: General: NAD,   Head: Normocephalic, atraumatic.  Eyes: Anicteric, EOMI  Nose: Mucous membranes moist, not inflammed, nonerythematous.  Throat: Oropharynx nonerythematous, no exudate appreciated.   Neck: Supple, trachea midline.  Lungs:  Normal respiratory effort. Clear to auscultation BL without crackles or wheezes.  Heart: RRR. S1 and S2 normal without gallop, murmur, or rubs.  Abdomen:  BS normoactive. Soft, Nondistended, non-tender.  No masses or organomegaly.  Extremities: No pretibial edema.  Neurologic: A&O X3, Motor strength is 5/5 in the all 4 extremities  Skin: No visible rashes, scars.    Lab results: Basic Metabolic Panel: Recent Labs  Lab 08/13/17 2353  NA 142  K 5.2*  CL 111  CO2 21*  GLUCOSE 104*  BUN 58*  CREATININE 4.82*  CALCIUM 8.6*  MG 2.3    Liver Function Tests: No results for input(s): AST, ALT, ALKPHOS, BILITOT, PROT, ALBUMIN in the last 168 hours. No results for input(s): LIPASE, AMYLASE in the last 168 hours. No results for input(s): AMMONIA in the last 168 hours.  CBC: Recent Labs  Lab 08/13/17 2353  WBC 4.7  HGB 10.2*  HCT 31.7*  MCV 88.8  PLT 187    Cardiac Enzymes: Recent Labs  Lab 08/13/17 2353  TROPONINI <0.03    BNP: Invalid input(s): POCBNP  CBG: No results for input(s): GLUCAP in the last 168 hours.  Microbiology: No results found for this or any previous visit.  Coagulation Studies: Recent Labs    08/14/17 0608  LABPROT 15.2  INR  1.21    Urinalysis: No results for input(s): COLORURINE, LABSPEC, PHURINE, GLUCOSEU, HGBUR, BILIRUBINUR, KETONESUR, PROTEINUR, UROBILINOGEN, NITRITE, LEUKOCYTESUR in the last 72 hours.  Invalid input(s): APPERANCEUR    Imaging: Dg Chest 1 View  Result Date: 08/14/2017 CLINICAL DATA:  Left-sided thoracentesis. EXAM: CHEST 1 VIEW COMPARISON:  Ultrasound 08/14/2017.  Chest x-ray 08/14/2017 . FINDINGS: Mediastinum and hilar structures normal. Heart size stable. Stable bibasilar atelectasis and infiltrates. Persistent bilateral pleural effusions, left side greater than right. Interim decrease in left-sided pleural effusion since prior thoracentesis. No pneumothorax . IMPRESSION: No pneumothorax post thoracentesis. Electronically Signed   By: Maisie Fushomas  Register   On: 08/14/2017 11:15   Dg Chest 2 View  Result Date: 08/14/2017 CLINICAL DATA:  76 year old female with shortness of breath. EXAM: CHEST  2 VIEW COMPARISON:  None. FINDINGS: There is a large left hand moderate right pleural effusion with associated compressive atelectasis of the lung bases. Pneumonia is not excluded. Clinical correlation is recommended. There is no pneumothorax. The cardiac borders are silhouetted. There is atherosclerotic calcification of the aortic arch. There is osteopenia with degenerative changes of the spine. No acute osseous pathology. IMPRESSION: Bilateral pleural effusions, left greater than right with compressive atelectasis of the lung bases. Clinical correlation is recommended. Electronically Signed   By: Elgie CollardArash  Radparvar M.D.   On: 08/14/2017 00:46   [place] Thoracentesis Asp Pleural Space W/img Guide  Result Date: 08/14/2017 INDICATION: Shortness of breath. Left-sided pleural effusion. Request therapeutic thoracentesis. EXAM: ULTRASOUND GUIDED LEFT THORACENTESIS MEDICATIONS: None. COMPLICATIONS: None immediate. Postprocedural chest x-ray negative for pneumothorax. PROCEDURE: An ultrasound guided thoracentesis was  thoroughly discussed with the patient and questions answered. The benefits, risks, alternatives and complications were also discussed. The patient understands and wishes to proceed with the procedure. Written consent was obtained. Ultrasound was performed to localize and mark an adequate pocket of fluid  in the left chest. The area was then prepped and draped in the normal sterile fashion. 1% Lidocaine was used for local anesthesia. Under ultrasound guidance a Safe-T-Centesis catheter was introduced. Thoracentesis was performed. The catheter was removed and a dressing applied. FINDINGS: A total of approximately 800 mL of clear yellow fluid was removed before the patient began experiencing severe cough. Procedure was terminated at this time. IMPRESSION: Successful ultrasound guided left thoracentesis yielding 800 mL of pleural fluid. Read by: Brayton El PA-C Electronically Signed   By: Richarda Overlie M.D.   On: 08/14/2017 11:30      Assessment & Plan: Pt is a 76 y.o. female  with a PMHx of chronic kidney disease stage V, congestive heart failure, hypertension, anemia chronic kidney disease, secondary hyperparathyroidism, who was admitted to [facility] on 08/13/2017 for evaluation of increasing shortness of breath and lower extremity edema.  1.  Chronic kidney disease stage V. 2.  Hypertension. 3.  Anemia of CKD.  4.  Secondary hyperparathyroidism. 5.  Bilateral pleural effusions.  Plan:  We are asked to see the patient for heradvanced chronic kidney disease.  She was previously following with [facility] nephrology and last saw them in April of 2017.  She states that thereafter she missed a number of scheduled appointments and subsequently received a letter from them stating that she would need to find alternative nephrology care.  She clearly appears to be in need of dialysis now as she has dysgeusia, volume overload, easy fatigability, and weight loss.  We talked about the forms of renal replacement therapy in  depth.  She was quite adamant that she did not want a PermCath to be placed.  Therefore we offered her peritoneal dialysis with urgent start as an outpatient.  She is agreeable to this.  I have discussed the case with vascular surgery and tentatively we will plan for PermCath placement tomorrow.  I've also discussed plan with our outpatient peritoneal dialysis team and they will look to start her on peritoneal dialysis within the very near future.  The Thanksgiving holiday has posed a few challenges to her care.  For now we will start the patient on a Lasix drip.  Further plan as patient progresses.  Thanks for consultation.

## 2017-08-14 NOTE — Care Management Obs Status (Signed)
MEDICARE OBSERVATION STATUS NOTIFICATION   Patient Details  Name: Ann Cunningham MRN: [id number] Date of Birth: July 15, 1941   Medicare Observation Status Notification Given:  Yes    Gwenette GreetBrenda S Legend Pecore, RN 08/14/2017, 10:01 AM

## 2017-08-14 NOTE — H&P (Signed)
Ann Cunningham is an 76 y.o. female.   Chief Complaint: Shortness of breath HPI: The patient with past medical history of CHF, CKD not yet on dialysis, hypertension and hypothyroidism presents to emergency department with shortness of breath.  The patient states that she has become gradually more short of breath over a few days.  She admits to significant lower extremity edema as well as orthopnea.  In the emergency department x-ray of her chest showed a large left-sided pleural effusion as well as smaller right-sided pleural effusion.  She was given Lasix that she still produces some urine to which she had fair urine output.  The patient continued to be short of breath but not hypoxic.  Due to ongoing dyspnea as well as orthopnea the emergency department staff called the hospitalist service for admission.  Past Medical History:  Diagnosis Date  . CHF (congestive heart failure) ([place])   . CKD (chronic kidney disease)   . Hypertension   . Hypothyroidism   . Renal disorder   . Uses walker     Past Surgical History:  Procedure Laterality Date  . CATARACT EXTRACTION PHACO AND INTRAOCULAR LENS PLACEMENT (IOC)  Right complicated Right 04/26/7077   Performed by Ronnell Freshwater, MD at [facility]  . CATARACT EXTRACTION W/ INTRAOCULAR LENS IMPLANT      Family History  Problem Relation Age of Onset  . Hypertension Mother    Social History:  reports that she quit smoking about 2 months ago. Her smoking use included cigarettes. She smoked 0.50 packs per day. Her smokeless tobacco use includes snuff. She reports that she does not drink alcohol or use drugs.  Allergies: No Known Allergies  Medications Prior to Admission  Medication Sig Dispense Refill  . aspirin 81 MG tablet Take 81 mg by mouth daily.    [name] Brinzolamide-Brimonidine [facility] OP) Apply to eye daily as needed.    . diclofenac sodium (VOLTAREN) 1 % GEL Apply 2 g topically 4 (four) times daily. 100 g 0  . furosemide  (LASIX) 20 MG tablet Take 20 mg by mouth.    . levothyroxine (SYNTHROID, LEVOTHROID) 25 MCG tablet Take 25 mcg by mouth daily before breakfast.    . pravastatin (PRAVACHOL) 20 MG tablet Take 20 mg by mouth daily.      Results for orders placed or performed during the hospital encounter of 08/13/17 (from the past 48 hour(s))  Basic metabolic panel     Status: Abnormal   Collection Time: 08/13/17 11:53 PM  Result Value Ref Range   Sodium 142 135 - 145 mmol/L   Potassium 5.2 (H) 3.5 - 5.1 mmol/L   Chloride 111 101 - 111 mmol/L   CO2 21 (L) 22 - 32 mmol/L   Glucose, Bld 104 (H) 65 - 99 mg/dL   BUN 58 (H) 6 - 20 mg/dL   Creatinine, Ser 4.82 (H) 0.44 - 1.00 mg/dL   Calcium 8.6 (L) 8.9 - 10.3 mg/dL   GFR calc non Af Amer 8 (L) >60 mL/min   GFR calc Af Amer 9 (L) >60 mL/min    Comment: (NOTE) The eGFR has been calculated using the CKD EPI equation. This calculation has not been validated in all clinical situations. eGFR's persistently <60 mL/min signify possible Chronic Kidney Disease.    Anion gap 10 5 - 15  Magnesium     Status: None   Collection Time: 08/13/17 11:53 PM  Result Value Ref Range   Magnesium 2.3 1.7 - 2.4 mg/dL  Troponin I     Status: None   Collection Time: 08/13/17 11:53 PM  Result Value Ref Range   Troponin I <0.03 <0.03 ng/mL  Brain natriuretic peptide (order ONLY if patient c/o SOB)     Status: Abnormal   Collection Time: 08/13/17 11:53 PM  Result Value Ref Range   B Natriuretic Peptide 816.0 (H) 0.0 - 100.0 pg/mL  CBC     Status: Abnormal   Collection Time: 08/13/17 11:53 PM  Result Value Ref Range   WBC 4.7 3.6 - 11.0 K/uL   RBC 3.57 (L) 3.80 - 5.20 MIL/uL   Hemoglobin 10.2 (L) 12.0 - 16.0 g/dL   HCT 31.7 (L) 35.0 - 47.0 %   MCV 88.8 80.0 - 100.0 fL   MCH 28.6 26.0 - 34.0 pg   MCHC 32.2 32.0 - 36.0 g/dL   RDW 15.2 (H) 11.5 - 14.5 %   Platelets 187 150 - 440 K/uL  TSH     Status: Abnormal   Collection Time: 08/14/17  6:08 AM  Result Value Ref Range    TSH 5.704 (H) 0.350 - 4.500 uIU/mL    Comment: Performed by a 3rd Generation assay with a functional sensitivity of <=0.01 uIU/mL.  Protime-INR     Status: None   Collection Time: 08/14/17  6:08 AM  Result Value Ref Range   Prothrombin Time 15.2 11.4 - 15.2 seconds   INR 1.21    Dg Chest 2 View  Result Date: 08/14/2017 CLINICAL DATA:  76-year-old female with shortness of breath. EXAM: CHEST  2 VIEW COMPARISON:  None. FINDINGS: There is a large left hand moderate right pleural effusion with associated compressive atelectasis of the lung bases. Pneumonia is not excluded. Clinical correlation is recommended. There is no pneumothorax. The cardiac borders are silhouetted. There is atherosclerotic calcification of the aortic arch. There is osteopenia with degenerative changes of the spine. No acute osseous pathology. IMPRESSION: Bilateral pleural effusions, left greater than right with compressive atelectasis of the lung bases. Clinical correlation is recommended. Electronically Signed   By: Arash  Radparvar M.D.   On: 08/14/2017 00:46    Review of Systems  Constitutional: Negative for chills and fever.  HENT: Negative for sore throat and tinnitus.   Eyes: Negative for blurred vision and redness.  Respiratory: Positive for shortness of breath. Negative for cough.   Cardiovascular: Negative for chest pain, palpitations, orthopnea and PND.  Gastrointestinal: Negative for abdominal pain, diarrhea, nausea and vomiting.  Genitourinary: Negative for dysuria, frequency and urgency.  Musculoskeletal: Negative for joint pain and myalgias.  Skin: Negative for rash.       No lesions  Neurological: Negative for speech change, focal weakness and weakness.  Endo/Heme/Allergies: Does not bruise/bleed easily.       No temperature intolerance  Psychiatric/Behavioral: Negative for depression and suicidal ideas.    Blood pressure (!) 185/91, pulse 98, temperature (!) 97.4 F (36.3 C), temperature source Oral,  resp. rate (!) 22, height 5' 2" (1.575 m), weight 63.2 kg (139 lb 6.4 oz), SpO2 95 %. Physical Exam  Vitals reviewed. Constitutional: She is oriented to person, place, and time. She appears well-developed and well-nourished. No distress.  HENT:  Head: Normocephalic and atraumatic.  Mouth/Throat: Oropharynx is clear and moist.  Eyes: Conjunctivae and EOM are normal. Pupils are equal, round, and reactive to light. No scleral icterus.  Neck: Normal range of motion. Neck supple. No JVD present. No tracheal deviation present. No thyromegaly present.  Cardiovascular: Normal rate, regular rhythm   and normal heart sounds. Exam reveals no gallop and no friction rub.  No murmur heard. Respiratory: Effort normal. She has decreased breath sounds in the left lower field.  GI: Soft. Bowel sounds are normal. She exhibits no distension. There is no tenderness.  Genitourinary:  Genitourinary Comments: Deferred  Musculoskeletal: Normal range of motion. She exhibits no edema.  Lymphadenopathy:    She has no cervical adenopathy.  Neurological: She is alert and oriented to person, place, and time. No cranial nerve deficit. She exhibits normal muscle tone.  Skin: Skin is warm and dry. No rash noted. No erythema.  Psychiatric: She has a normal mood and affect. Her behavior is normal. Judgment and thought content normal.     Assessment/Plan This is a 76 year old female admitted for dyspnea secondary to pleural effusion. 1.  Pleural effusion: Left greater than right; combination of CHF and chronic kidney disease.  Patient also has significant lower extremity edema.  Continue Lasix as tolerated.  Check INR.  Patient will benefit from thoracentesis. 2.  ESRD: GFR less than 10.  The patient continues to make urine.  Consult nephrology. 3.  CHF: Diastolic; acute on chronic.  Continue diuresis.  Limit total daily fluid. 4.  Hypertension: Uncontrolled; apply Nitropaste to the patient's chest.  Labetalol as needed. 5.   Hypothyroidism: Continue Synthroid.  Check TSH 6.  Hyperlipidemia: Continue statin therapy 7.  DVT prophylaxis: SCDs 8.  GI prophylaxis: None The patient is a full code.  Time spent on admission orders and patient care approximately 45 minutes  Harrie Foreman, MD 08/14/2017, 7:42 AM

## 2017-08-14 NOTE — Procedures (Signed)
PROCEDURE SUMMARY:  Successful US guided left thoracentesis. Yielded 800 mL of clear yellow fluid. Pt tolerated procedure well but was stopped due to severe cough. No immediate complications.  Specimen was not sent for labs. CXR ordered.  Brayton ElBRUNING, Alyla Pietila PA-C 08/14/2017 10:57 AM

## 2017-08-14 NOTE — ED Notes (Signed)
Report off to kailey rn  

## 2017-08-14 NOTE — Progress Notes (Signed)
PT Cancellation Note  Patient Details Name: Ann CoolerWilma Cunningham MRN: [id number] DOB: 1940/12/25   Cancelled Treatment:    Reason Eval/Treat Not Completed: Medical issues which prohibited therapy(Consult received and chart reviewed.  Patient noted with hyperkalemia (5.2); critical level, contraindicated for exertional activity at this time.  Per primary RN, patient in with nephrologist, discussing initiation of dialysis to address worsening function and labs.  Will hold at this time and re-attempt next session as medically appropriate.)   Wasil Wolke H. Manson PasseyBrown, PT, DPT, NCS 08/14/17, 3:39 PM [phone]

## 2017-08-14 NOTE — Progress Notes (Signed)
[facility] - [place] [facility]   PATIENT NAME: Ann CoolerWilma Cunningham    MR#:  [id number]  DATE OF BIRTH:  1941-03-15  SUBJECTIVE:  CHIEF COMPLAINT:   Chief Complaint  Patient presents with  . Shortness of Breath   - feeling short of breath lately, also CXR with vascular congestion and bilateral pleural effusions - s/p left thoracentesis today - renal function worsening - weight loss, decreased appetite and nausea going on for a while nowreased appetite fpr  REVIEW OF SYSTEMS:  Review of Systems  Constitutional: Positive for malaise/fatigue and weight loss. Negative for chills and fever.  HENT: Negative for ear discharge, ear pain, hearing loss and nosebleeds.   Eyes: Negative for blurred vision and double vision.  Respiratory: Negative for cough, shortness of breath and wheezing.   Cardiovascular: Negative for chest pain and palpitations.  Gastrointestinal: Positive for nausea. Negative for abdominal pain, constipation, diarrhea and vomiting.  Genitourinary: Negative for dysuria.  Musculoskeletal: Negative for myalgias.  Neurological: Negative for dizziness, speech change, focal weakness, seizures and headaches.  Psychiatric/Behavioral: Negative for depression.    DRUG ALLERGIES:  No Known Allergies  VITALS:  Blood pressure (!) 168/93, pulse 84, temperature 97.7 F (36.5 C), temperature source Oral, resp. rate 17, height 5\' 2"  (1.575 m), weight 63.2 kg (139 lb 6.4 oz), SpO2 96 %.  PHYSICAL EXAMINATION:  Physical Exam  GENERAL:  76 y.o.-year-old ill nourished patient lying in the bed with no acute distress.  EYES: Pupils equal, round, reactive to light and accommodation. No scleral icterus. Extraocular muscles intact.  HEENT: Head atraumatic, normocephalic. Oropharynx and nasopharynx clear.  NECK:  Supple, no jugular venous distention. No thyroid enlargement, no tenderness.  LUNGS: Normal breath sounds bilaterally, no wheezing, rales,rhonchi or crepitation.  No use of accessory muscles of respiration. Decreased at the bases CARDIOVASCULAR: S1, S2 normal. No rubs, or gallops. 2/6 systolic murmur present ABDOMEN: Soft, nontender, nondistended. Bowel sounds present. No organomegaly or mass.  EXTREMITIES: No pedal edema, cyanosis, or clubbing.  NEUROLOGIC: Cranial nerves II through XII are intact. Muscle strength 5/5 in all extremities. Sensation intact. Gait not checked.  PSYCHIATRIC: The patient is alert and oriented x 3.  SKIN: No obvious rash, lesion, or ulcer.    LABORATORY PANEL:   CBC Recent Labs  Lab 08/13/17 2353  WBC 4.7  HGB 10.2*  HCT 31.7*  PLT 187   ------------------------------------------------------------------------------------------------------------------  Chemistries  Recent Labs  Lab 08/13/17 2353  NA 142  K 5.2*  CL 111  CO2 21*  GLUCOSE 104*  BUN 58*  CREATININE 4.82*  CALCIUM 8.6*  MG 2.3   ------------------------------------------------------------------------------------------------------------------  Cardiac Enzymes Recent Labs  Lab 08/13/17 2353  TROPONINI <0.03   ------------------------------------------------------------------------------------------------------------------  RADIOLOGY:  Dg Chest 1 View  Result Date: 08/14/2017 CLINICAL DATA:  Left-sided thoracentesis. EXAM: CHEST 1 VIEW COMPARISON:  Ultrasound 08/14/2017.  Chest x-ray 08/14/2017 . FINDINGS: Mediastinum and hilar structures normal. Heart size stable. Stable bibasilar atelectasis and infiltrates. Persistent bilateral pleural effusions, left side greater than right. Interim decrease in left-sided pleural effusion since prior thoracentesis. No pneumothorax . IMPRESSION: No pneumothorax post thoracentesis. Electronically Signed   By: Maisie Fushomas  Register   On: 08/14/2017 11:15   Dg Chest 2 View  Result Date: 08/14/2017 CLINICAL DATA:  76 year old female with shortness of breath. EXAM: CHEST  2 VIEW COMPARISON:  None. FINDINGS:  There is a large left hand moderate right pleural effusion with associated compressive atelectasis of the lung bases. Pneumonia is not excluded.  Clinical correlation is recommended. There is no pneumothorax. The cardiac borders are silhouetted. There is atherosclerotic calcification of the aortic arch. There is osteopenia with degenerative changes of the spine. No acute osseous pathology. IMPRESSION: Bilateral pleural effusions, left greater than right with compressive atelectasis of the lung bases. Clinical correlation is recommended. Electronically Signed   By: Elgie CollardArash  Radparvar M.D.   On: 08/14/2017 00:46   [place] Thoracentesis Asp Pleural Space W/img Guide  Result Date: 08/14/2017 INDICATION: Shortness of breath. Left-sided pleural effusion. Request therapeutic thoracentesis. EXAM: ULTRASOUND GUIDED LEFT THORACENTESIS MEDICATIONS: None. COMPLICATIONS: None immediate. Postprocedural chest x-ray negative for pneumothorax. PROCEDURE: An ultrasound guided thoracentesis was thoroughly discussed with the patient and questions answered. The benefits, risks, alternatives and complications were also discussed. The patient understands and wishes to proceed with the procedure. Written consent was obtained. Ultrasound was performed to localize and mark an adequate pocket of fluid in the left chest. The area was then prepped and draped in the normal sterile fashion. 1% Lidocaine was used for local anesthesia. Under ultrasound guidance a Safe-T-Centesis catheter was introduced. Thoracentesis was performed. The catheter was removed and a dressing applied. FINDINGS: A total of approximately 800 mL of clear yellow fluid was removed before the patient began experiencing severe cough. Procedure was terminated at this time. IMPRESSION: Successful ultrasound guided left thoracentesis yielding 800 mL of pleural fluid. Read by: Brayton ElKevin Bruning PA-C Electronically Signed   By: Richarda OverlieAdam  Henn M.D.   On: 08/14/2017 11:30    EKG:   Orders  placed or performed during the hospital encounter of 08/13/17  . ED EKG  . ED EKG  . EKG 12-Lead  . 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, hypertension, hyperlipidemia admitted with worsening shortness of breath  #1 Acute respiratory failure- secondary to pulmonary edema and pleural effusions - s/p IV lasix, will continue lasix IV - could be worsening renal disease causing fluid retention vs chf - ECHO ordered - s/p left thoracentesis done and 800cc fluid removed- labs sent for cytology, differential  #2 ARF on CKD stage 5- biopsy proven hypertensive nephrosclerosis, last year gfr 11- dialysis was not imminent but considered and patient wanted to wait - now has some uremic symptoms, though makes urine- loss of appetite, weight loss and fluid build up noted - Nephrology consulted for the same - gfr 9 now  #3 HTN- on norvasc  #4 Hyperlipidemia- on statin  #5 Hypothyroidism- synthroid  #6 DVT Prophylaxis- start SQ heparin   Physical Therapy consulted.    All the records are reviewed and case discussed with Care Management/Social Workerr. Management plans discussed with the patient, family and they are in agreement.  CODE STATUS: Full Code  TOTAL TIME TAKING CARE OF THIS PATIENT: 38 minutes.   POSSIBLE D/C IN 2-3 DAYS, DEPENDING ON CLINICAL CONDITION.   Churchill Grimsley M.D on 08/14/2017 at 12:50 PM  Between 7am to 6pm - Pager - [phone]  After 6pm go to [email or web address] - Social research officer, governmentpassword [email or web address] [email or web address]  [facility]  Office  [phone]  CC: Primary care physician; Center, [facility]

## 2017-08-15 ENCOUNTER — Observation Stay: Payer: Medicare Other | Admitting: Certified Registered"

## 2017-08-15 ENCOUNTER — Encounter
Admission: EM | Disposition: A | Discharge status: Home / Self Care | Payer: Self-pay | Source: Home / Self Care | Attending: Emergency Medicine

## 2017-08-15 ENCOUNTER — Encounter: Payer: Self-pay | Admitting: *Deleted

## 2017-08-15 DIAGNOSIS — N186 End stage renal disease: Secondary | ICD-10-CM | POA: Diagnosis not present

## 2017-08-15 HISTORY — PX: CAPD INSERTION: SHX5233

## 2017-08-15 LAB — BASIC METABOLIC PANEL
Anion gap: 9 (ref 5–15)
BUN: 57 mg/dL — ABNORMAL HIGH (ref 6–20)
CHLORIDE: 113 mmol/L — AB (ref 101–111)
CO2: 21 mmol/L — ABNORMAL LOW (ref 22–32)
Calcium: 8.4 mg/dL — ABNORMAL LOW (ref 8.9–10.3)
Creatinine, Ser: 4.8 mg/dL — ABNORMAL HIGH (ref 0.44–1.00)
GFR, EST AFRICAN AMERICAN: 9 mL/min — AB (ref >60)
GFR, EST NON AFRICAN AMERICAN: 8 mL/min — AB (ref >60)
Glucose, Bld: 107 mg/dL — ABNORMAL HIGH (ref 65–99)
Potassium: 4.7 mmol/L (ref 3.5–5.1)
SODIUM: 143 mmol/L (ref 135–145)

## 2017-08-15 LAB — ECHOCARDIOGRAM COMPLETE
HEIGHTINCHES: 62 in
WEIGHTICAEL: 2230.4 [oz_av]

## 2017-08-15 SURGERY — LAPAROSCOPIC INSERTION CONTINUOUS AMBULATORY PERITONEAL DIALYSIS  (CAPD) CATHETER
Anesthesia: General | Wound class: Clean

## 2017-08-15 MED ORDER — FENTANYL CITRATE (PF) 100 MCG/2ML IJ SOLN
INTRAMUSCULAR | Status: AC
  Filled 2017-08-15: qty 2

## 2017-08-15 MED ORDER — SODIUM CHLORIDE 0.9 % IV SOLN
INTRAVENOUS | Status: DC | PRN
  Administered 2017-08-15: 50 ug/min via INTRAVENOUS

## 2017-08-15 MED ORDER — AMLODIPINE BESYLATE 5 MG PO TABS: 5.0000 mg | ORAL_TABLET | Freq: Every day | ORAL | 2 refills | Status: DC

## 2017-08-15 MED ORDER — ROCURONIUM BROMIDE 100 MG/10ML IV SOLN
INTRAVENOUS | Status: DC | PRN
  Administered 2017-08-15: 30 mg via INTRAVENOUS

## 2017-08-15 MED ORDER — GLYCOPYRROLATE 0.2 MG/ML IJ SOLN
INTRAMUSCULAR | Status: DC | PRN
  Administered 2017-08-15: 0.4 mg via INTRAVENOUS

## 2017-08-15 MED ORDER — ONDANSETRON HCL 4 MG/2ML IJ SOLN: 4.0000 mg | Freq: Once | INTRAMUSCULAR | Status: DC | PRN

## 2017-08-15 MED ORDER — SODIUM CHLORIDE 0.9 % IV SOLN
INTRAVENOUS | Status: DC | PRN
  Administered 2017-08-15: 11:00:00 via INTRAVENOUS

## 2017-08-15 MED ORDER — FUROSEMIDE 80 MG PO TABS: 80.0000 mg | ORAL_TABLET | Freq: Every day | ORAL | 1 refills | Status: DC

## 2017-08-15 MED ORDER — NEOSTIGMINE METHYLSULFATE 10 MG/10ML IV SOLN
INTRAVENOUS | Status: DC | PRN
  Administered 2017-08-15: 3 mg via INTRAVENOUS

## 2017-08-15 MED ORDER — PHENYLEPHRINE HCL 10 MG/ML IJ SOLN
INTRAMUSCULAR | Status: DC | PRN
Start: 2017-08-15 — End: 2017-08-15
  Administered 2017-08-15 (×2): 100 ug via INTRAVENOUS
  Administered 2017-08-15: 200 ug via INTRAVENOUS
  Administered 2017-08-15: 100 ug via INTRAVENOUS

## 2017-08-15 MED ORDER — LIDOCAINE HCL (CARDIAC) 20 MG/ML IV SOLN
INTRAVENOUS | Status: DC | PRN
  Administered 2017-08-15: 60 mg via INTRAVENOUS

## 2017-08-15 MED ORDER — VASOPRESSIN 20 UNIT/ML IV SOLN
INTRAVENOUS | Status: DC | PRN
  Administered 2017-08-15 (×3): 1 [IU] via INTRAVENOUS

## 2017-08-15 MED ORDER — DEXTROSE 5 % IV SOLN
1.0000 g | INTRAVENOUS | Status: AC
  Administered 2017-08-15: 2 g via INTRAVENOUS
  Filled 2017-08-15: qty 10

## 2017-08-15 MED ORDER — ROCURONIUM BROMIDE 50 MG/5ML IV SOLN
INTRAVENOUS | Status: AC
  Filled 2017-08-15: qty 1

## 2017-08-15 MED ORDER — PROPOFOL 10 MG/ML IV BOLUS
INTRAVENOUS | Status: DC | PRN
  Administered 2017-08-15: 80 mg via INTRAVENOUS

## 2017-08-15 MED ORDER — PROPOFOL 10 MG/ML IV BOLUS
INTRAVENOUS | Status: AC
  Filled 2017-08-15: qty 20

## 2017-08-15 MED ORDER — LIDOCAINE HCL (PF) 2 % IJ SOLN
INTRAMUSCULAR | Status: AC
  Filled 2017-08-15: qty 10

## 2017-08-15 MED ORDER — FENTANYL CITRATE (PF) 100 MCG/2ML IJ SOLN
INTRAMUSCULAR | Status: DC | PRN
  Administered 2017-08-15: 50 ug via INTRAVENOUS

## 2017-08-15 MED ORDER — BUPIVACAINE-EPINEPHRINE (PF) 0.25% -1:200000 IJ SOLN
INTRAMUSCULAR | Status: DC | PRN
  Administered 2017-08-15: 10 mL

## 2017-08-15 MED ORDER — FENTANYL CITRATE (PF) 100 MCG/2ML IJ SOLN: 25.0000 ug | INTRAMUSCULAR | Status: DC | PRN

## 2017-08-15 SURGICAL SUPPLY — 37 items
ADAPTER BETA CAP QUINTON DIALY (ADAPTER) ×3 IMPLANT
ADAPTER CATH DIALYSIS 18.75 (CATHETERS) ×2 IMPLANT
ADAPTER CATH DIALYSIS 18.75CM (CATHETERS) ×1
BLADE SURG 15 STRL LF DISP TIS (BLADE) ×1 IMPLANT
BLADE SURG 15 STRL SS (BLADE) ×2
CANISTER SUCT 1200ML W/VALVE (MISCELLANEOUS) ×3 IMPLANT
CATH DLYS SWAN NECK 62.5CM (CATHETERS) ×3 IMPLANT
CHLORAPREP W/TINT 26ML (MISCELLANEOUS) ×3 IMPLANT
DERMABOND ADVANCED (GAUZE/BANDAGES/DRESSINGS) ×2
DERMABOND ADVANCED .7 DNX12 (GAUZE/BANDAGES/DRESSINGS) ×1 IMPLANT
ELECT REM PT RETURN 9FT ADLT (ELECTROSURGICAL) ×3
ELECTRODE REM PT RTRN 9FT ADLT (ELECTROSURGICAL) ×1 IMPLANT
GAUZE SPONGE 4X4 12PLY STRL (GAUZE/BANDAGES/DRESSINGS) ×3 IMPLANT
GLOVE BIO SURGEON STRL SZ7 (GLOVE) ×3 IMPLANT
GLOVE INDICATOR 7.5 STRL GRN (GLOVE) ×3 IMPLANT
GLOVE SURG SYN 8.0 (GLOVE) ×3 IMPLANT
GOWN STRL REUS W/ TWL LRG LVL3 (GOWN DISPOSABLE) ×1 IMPLANT
GOWN STRL REUS W/ TWL XL LVL3 (GOWN DISPOSABLE) ×1 IMPLANT
GOWN STRL REUS W/TWL LRG LVL3 (GOWN DISPOSABLE) ×2
GOWN STRL REUS W/TWL XL LVL3 (GOWN DISPOSABLE) ×2
KIT RM TURNOVER STRD PROC AR (KITS) ×3 IMPLANT
LABEL OR SOLS (LABEL) ×3 IMPLANT
MINICAP W/POVIDONE IODINE SOL (MISCELLANEOUS) ×3 IMPLANT
NEEDLE HYPO 25X1 1.5 SAFETY (NEEDLE) ×3 IMPLANT
NEEDLE INSUFFLATION 150MM (ENDOMECHANICALS) ×3 IMPLANT
NS IRRIG 500ML POUR BTL (IV SOLUTION) ×3 IMPLANT
PACK LAP CHOLECYSTECTOMY (MISCELLANEOUS) ×3 IMPLANT
PENCIL ELECTRO HAND CTR (MISCELLANEOUS) ×3 IMPLANT
SET TRANSFER 6 W/TWIST CLAMP 5 (SET/KITS/TRAYS/PACK) ×3 IMPLANT
SLEEVE ENDOPATH XCEL 5M (ENDOMECHANICALS) ×3 IMPLANT
SUT MNCRL AB 4-0 PS2 18 (SUTURE) ×3 IMPLANT
SUT VIC AB 3-0 SH 27 (SUTURE) ×4
SUT VIC AB 3-0 SH 27X BRD (SUTURE) ×2 IMPLANT
SUT VICRYL 0 AB UR-6 (SUTURE) ×6 IMPLANT
SYR 50ML LL SCALE MARK (SYRINGE) ×3 IMPLANT
TROCAR XCEL NON-BLD 5MMX100MML (ENDOMECHANICALS) ×3 IMPLANT
TUBING INSUFFLATION (TUBING) ×3 IMPLANT

## 2017-08-15 NOTE — Anesthesia Post-op Follow-up Note (Signed)
Anesthesia QCDR form completed.        

## 2017-08-15 NOTE — Progress Notes (Signed)
[facility]  ROUNDING NOTE   Subjective:  Patient just had peritoneal dialysis catheter placed. This was performed successfully. Patient arousable but still recovering from anesthesia.   Objective:  Vital signs in last 24 hours:  Temp:  [97.3 F (36.3 C)-98.4 F (36.9 C)] 97.3 F (36.3 C) (11/21 1027) Pulse Rate:  [79-94] 79 (11/21 1027) Resp:  [18-22] 22 (11/21 1027) BP: (122-147)/(68-78) 138/73 (11/21 1027) SpO2:  [95 %-100 %] 95 % (11/21 1027) Weight:  [60.8 kg (134 lb 1.6 oz)] 60.8 kg (134 lb 1.6 oz) (11/21 0442)  Weight change: 1.86 kg (4 lb 1.6 oz) Filed Weights   08/13/17 2350 08/14/17 0511 08/15/17 0442  Weight: 59 kg (130 lb) 63.2 kg (139 lb 6.4 oz) 60.8 kg (134 lb 1.6 oz)    Intake/Output: I/O last 3 completed shifts: In: 332 [P.O.:240; I.V.:92] Out: 1100 [Urine:1100]   Intake/Output this shift:  Total I/O In: 200 [I.V.:200] Out: -   Physical Exam: General: No acute distress  Head: Normocephalic, atraumatic. Moist oral mucosal membranes  Eyes: Anicteric  Neck: Supple, trachea midline  Lungs:  Clear to auscultation, normal effort  Heart: S1S2 no rubs  Abdomen:  Soft, nontender, bowel sounds present  Extremities: Trace peripheral edema.  Neurologic: Lethargic but arousable  Skin: No lesions  Access: PD catheter placed    Basic Metabolic Panel: Recent Labs  Lab 08/13/17 2353 08/15/17 0430  NA 142 143  K 5.2* 4.7  CL 111 113*  CO2 21* 21*  GLUCOSE 104* 107*  BUN 58* 57*  CREATININE 4.82* 4.80*  CALCIUM 8.6* 8.4*  MG 2.3  --     Liver Function Tests: No results for input(s): AST, ALT, ALKPHOS, BILITOT, PROT, ALBUMIN in the last 168 hours. No results for input(s): LIPASE, AMYLASE in the last 168 hours. No results for input(s): AMMONIA in the last 168 hours.  CBC: Recent Labs  Lab 08/13/17 2353  WBC 4.7  HGB 10.2*  HCT 31.7*  MCV 88.8  PLT 187    Cardiac Enzymes: Recent Labs  Lab 08/13/17 2353  TROPONINI <0.03     BNP: Invalid input(s): POCBNP  CBG: No results for input(s): GLUCAP in the last 168 hours.  Microbiology: No results found for this or any previous visit.  Coagulation Studies: Recent Labs    08/14/17 0608  LABPROT 15.2  INR 1.21    Urinalysis: No results for input(s): COLORURINE, LABSPEC, PHURINE, GLUCOSEU, HGBUR, BILIRUBINUR, KETONESUR, PROTEINUR, UROBILINOGEN, NITRITE, LEUKOCYTESUR in the last 72 hours.  Invalid input(s): APPERANCEUR    Imaging: Dg Chest 1 View  Result Date: 08/14/2017 CLINICAL DATA:  Left-sided thoracentesis. EXAM: CHEST 1 VIEW COMPARISON:  Ultrasound 08/14/2017.  Chest x-ray 08/14/2017 . FINDINGS: Mediastinum and hilar structures normal. Heart size stable. Stable bibasilar atelectasis and infiltrates. Persistent bilateral pleural effusions, left side greater than right. Interim decrease in left-sided pleural effusion since prior thoracentesis. No pneumothorax . IMPRESSION: No pneumothorax post thoracentesis. Electronically Signed   By: Maisie Fushomas  Register   On: 08/14/2017 11:15   Dg Chest 2 View  Result Date: 08/14/2017 CLINICAL DATA:  76 year old female with shortness of breath. EXAM: CHEST  2 VIEW COMPARISON:  None. FINDINGS: There is a large left hand moderate right pleural effusion with associated compressive atelectasis of the lung bases. Pneumonia is not excluded. Clinical correlation is recommended. There is no pneumothorax. The cardiac borders are silhouetted. There is atherosclerotic calcification of the aortic arch. There is osteopenia with degenerative changes of the spine. No acute osseous pathology.  IMPRESSION: Bilateral pleural effusions, left greater than right with compressive atelectasis of the lung bases. Clinical correlation is recommended. Electronically Signed   By: Elgie CollardArash  Radparvar M.D.   On: 08/14/2017 00:46   [place] Thoracentesis Asp Pleural Space W/img Guide  Result Date: 08/14/2017 INDICATION: Shortness of breath. Left-sided pleural  effusion. Request therapeutic thoracentesis. EXAM: ULTRASOUND GUIDED LEFT THORACENTESIS MEDICATIONS: None. COMPLICATIONS: None immediate. Postprocedural chest x-ray negative for pneumothorax. PROCEDURE: An ultrasound guided thoracentesis was thoroughly discussed with the patient and questions answered. The benefits, risks, alternatives and complications were also discussed. The patient understands and wishes to proceed with the procedure. Written consent was obtained. Ultrasound was performed to localize and mark an adequate pocket of fluid in the left chest. The area was then prepped and draped in the normal sterile fashion. 1% Lidocaine was used for local anesthesia. Under ultrasound guidance a Safe-T-Centesis catheter was introduced. Thoracentesis was performed. The catheter was removed and a dressing applied. FINDINGS: A total of approximately 800 mL of clear yellow fluid was removed before the patient began experiencing severe cough. Procedure was terminated at this time. IMPRESSION: Successful ultrasound guided left thoracentesis yielding 800 mL of pleural fluid. Read by: Brayton ElKevin Bruning PA-C Electronically Signed   By: Richarda OverlieAdam  Henn M.D.   On: 08/14/2017 11:30     Medications:   . furosemide (LASIX) infusion 8 mg/hr (08/14/17 1601)   . [MAR Hold] amLODipine  5 mg Oral Daily  . [MAR Hold] docusate sodium  100 mg Oral BID  . [MAR Hold] heparin subcutaneous  5,000 Units Subcutaneous Q8H  . [MAR Hold] Influenza vac split quadrivalent PF  0.5 mL Intramuscular Tomorrow-1000  . [MAR Hold] levothyroxine  25 mcg Oral QAC breakfast  . [MAR Hold] nitroGLYCERIN  0.5 inch Topical Q6H  . [MAR Hold] pravastatin  20 mg Oral Daily   [MAR Hold] acetaminophen **OR** [MAR Hold] acetaminophen, fentaNYL (SUBLIMAZE) injection, [MAR Hold] labetalol, [MAR Hold] ondansetron **OR** [MAR Hold] ondansetron (ZOFRAN) IV, ondansetron (ZOFRAN) IV  Assessment/ Plan:  76 y.o. female  with a PMHx of chronic kidney disease stage V,  congestive heart failure, hypertension, anemia chronic kidney disease, secondary hyperparathyroidism, who [facility] for evaluation of increasing shortness of breath and lower extremity edema.  1.  Chronic kidney disease stage V. 2.  Hypertension. 3.  Anemia of CKD.  4.  Secondary hyperparathyroidism. 5.  Bilateral pleural effusions.  Plan:  PD catheter has been successfully placed.  We are in the process of arranging urgent start peritoneal dialysis on an outpatient basis.  She is still recovering from anesthesia.  Her lower extremity edema has improved with the Lasix.  Disposition is currently being worked upon by hospitalist.  Further plan as patient progresses.    LOS: 0 Jaidan Stachnik 11/21/201812:22 PM

## 2017-08-15 NOTE — Care Management (Addendum)
Spoke with Ann ChyleAmanda Cunningham. [name] should come to [facility] in [place] on Monday 9:30 am. The address is [address]   Ann GreetBrenda S Romana Deaton RN MSN CCM Care Management [phone]

## 2017-08-15 NOTE — Clinical Social Work Note (Signed)
Clinical Social Work Assessment  Patient Details  Name: Ann Cunningham MRN: [id number] Date of Birth: 01-Feb-1941  Date of referral:  08/15/17               Reason for consult:  Facility Placement                Permission sought to share information with:  Chartered certified accountant granted to share information::  Yes, Verbal Permission Granted  Name::      Ann [place]::   Ann Cunningham  Relationship::     Contact Information:     Housing/Transportation Living arrangements for the past 2 months:  [place] of Information:  Patient, Friend/Neighbor Patient Interpreter Needed:  None Criminal Activity/Legal Involvement Pertinent to Current Situation/Hospitalization:  No - Comment as needed Significant Relationships:  Other Family Members Lives with:  Roommate Do you feel safe going back to the place where you live?  Yes Need for family participation in patient care:  Yes (Comment)  Care giving concerns:  Patient lives in [place] with her friend/ roommate [place].    Social Worker assessment / plan:  Holiday representative (CSW) received verbal SNF consult from MD. Per MD patient will be starting peritoneal dialysis next week. CSW met with patient and her roommate Ann [place] was at bedside. CSW introduced self and explained role of CSW department. Patient reported that she lives in [place] with her friend Ann [place] and uses [employer], research (physical sciences) for transportation. CSW explained SNF process and that PT is recommending SNF. CSW explained that SNF's in [facility] will not accept patient on peritoneal dialysis. CSW requested for CSW to try to find a SNF that will take peritoneal dialysis. FL2 complete and faxed out.   Patient has no bed offers in [facility]. CSW met with patient and made her aware of above. Patient is agreeable to going home today with home health. RN case manager aware of above. Please reconsult if future social work needs  arise. CSW signing off.      Employment status:  Disabled (Comment on whether or not currently receiving Disability) Insurance information:  Managed Medicare PT Recommendations:  [place] / Referral to community resources:  Other (Comment Required)(Patient will D/C home wtih home health. )  Patient/Family's Response to care: No SNF's will accept patient on peritoneal dialysis so patient has decided to go home.   Patient/Family's Understanding of and Emotional Response to Diagnosis, Current Treatment, and Prognosis:  Patient was very pleasant and thanked CSW for visit.   Emotional Assessment Appearance:  Appears stated age Attitude/Demeanor/Rapport:    Affect (typically observed):  Accepting, Adaptable, Pleasant Orientation:  Oriented to Self, Oriented to Place, Oriented to  Time, Oriented to Situation Alcohol / Substance use:  Not Applicable Psych involvement (Current and /or in the community):  No (Comment)  Discharge Needs  Concerns to be addressed:  Discharge Planning Concerns Readmission within the last 30 days:  No Current discharge risk:  Dependent with Mobility Barriers to Discharge:  No Barriers Identified   [place], Ann Beets, LCSW 08/15/2017, 4:33 PM

## 2017-08-15 NOTE — [place] FL2 (Signed)
[place] MEDICAID FL2 LEVEL OF CARE SCREENING TOOL     IDENTIFICATION  Patient Name: Ann [place] Birthdate: May 06, 1941 Sex: female Admission Date (Current Location): 08/13/2017  [facility] [place]:  [place] ([id number]) Facility and Address:  [facility], [address], [place], [place] [address]      Provider Number: [id number]  Attending Physician Name and Address:  Enid BaasKalisetti, Radhika, MD  Relative Name and Phone Number:       Current Level of Care: Hospital Recommended Level of Care: Skilled Nursing Facility Prior Approval Number:    Date Approved/Denied:   PASRR Number: [id number] )  Discharge Plan: SNF    Current Diagnoses: Patient Active Problem List   Diagnosis Date Noted  . Dyspnea 08/14/2017    Orientation RESPIRATION BLADDER Height & Weight     Self, Time, Situation, Place  O2(3 Liters Oxygen ) Continent Weight: 134 lb 1.6 oz (60.8 kg) Height:  5\' 2"  (157.5 cm)  BEHAVIORAL SYMPTOMS/MOOD NEUROLOGICAL BOWEL NUTRITION STATUS      Continent Diet(Renal Diet: Fluid Restrictions. )  AMBULATORY STATUS COMMUNICATION OF NEEDS Skin   Extensive Assist Verbally Other (Comment)(peritoneal dialysis catheter )                       Personal Care Assistance Level of Assistance  Bathing, Feeding, Dressing Bathing Assistance: Limited assistance Feeding assistance: Independent Dressing Assistance: Limited assistance     Functional Limitations Info  Sight, Hearing, Speech Sight Info: Adequate Hearing Info: Adequate Speech Info: Adequate    SPECIAL CARE FACTORS FREQUENCY  PT (By licensed PT), OT (By licensed OT)     PT Frequency: (5) OT Frequency: (5)            Contractures      Additional Factors Info  Code Status, Allergies Code Status Info: (Full Code. ) Allergies Info: (No Known Allergies. )           Current Medications (08/15/2017):  This is the current hospital active medication list Current  Facility-Administered Medications  Medication Dose Route Frequency Provider Last Rate Last Dose  . acetaminophen (TYLENOL) tablet 650 mg  650 mg Oral Q6H PRN Arnaldo Nataliamond, Michael S, MD       Or  . acetaminophen (TYLENOL) suppository 650 mg  650 mg Rectal Q6H PRN Arnaldo Nataliamond, Michael S, MD      . amLODipine (NORVASC) tablet 5 mg  5 mg Oral Daily Enid BaasKalisetti, Radhika, MD   5 mg at 08/15/17 [id number]  . docusate sodium (COLACE) capsule 100 mg  100 mg Oral BID Arnaldo Nataliamond, Michael S, MD   100 mg at 08/15/17 [id number]  . furosemide (LASIX) 250 mg in dextrose 5 % 250 mL (1 mg/mL) infusion  8 mg/hr Intravenous Continuous Enid BaasKalisetti, Radhika, MD   Stopped at 08/15/17 1413  . heparin injection 5,000 Units  5,000 Units Subcutaneous Q8H Enid BaasKalisetti, Radhika, MD   5,000 Units at 08/15/17 0514  . Influenza vac split quadrivalent PF (FLUZONE HIGH-DOSE) injection 0.5 mL  0.5 mL Intramuscular Tomorrow-1000 Arnaldo Nataliamond, Michael S, MD      . labetalol (NORMODYNE,TRANDATE) injection 5-10 mg  5-10 mg Intravenous Q2H PRN Arnaldo Nataliamond, Michael S, MD      . levothyroxine (SYNTHROID, LEVOTHROID) tablet 25 mcg  25 mcg Oral QAC breakfast Arnaldo Nataliamond, Michael S, MD   25 mcg at 08/15/17 [phone]  . nitroGLYCERIN (NITROGLYN) 2 % ointment 0.5 inch  0.5 inch Topical Q6H Arnaldo Nataliamond, Michael S, MD   0.5 inch at 08/15/17 0514  .  ondansetron (ZOFRAN) tablet 4 mg  4 mg Oral Q6H PRN Arnaldo Nataliamond, Michael S, MD       Or  . ondansetron [facility](ZOFRAN) injection 4 mg  4 mg Intravenous Q6H PRN Arnaldo Nataliamond, Michael S, MD      . pravastatin (PRAVACHOL) tablet 20 mg  20 mg Oral Daily Arnaldo Nataliamond, Michael S, MD   20 mg at 08/14/17 1655     Discharge Medications: Please see discharge summary for a list of discharge medications.  Relevant Imaging Results:  Relevant Lab Results:   Additional Information (SSN: [id number] (peritoneal dialysis) )  Bristal Steffy, Darleen CrockerBailey M, LCSW

## 2017-08-15 NOTE — Progress Notes (Signed)
Pt being discharged home with home health, discharge instructions reviewed with pt and sig other, states understanding, pt with no complaints, no distress or discomfort noted at discharge

## 2017-08-15 NOTE — Progress Notes (Signed)
[facility] - [place] at [facility]   PATIENT NAME: Ann CoolerWilma Cunningham    MR#:  [id number]  DATE OF BIRTH:  1941/06/19  SUBJECTIVE:  CHIEF COMPLAINT:   Chief Complaint  Patient presents with  . Shortness of Breath   -Complains of weakness. Breathing has improved. -For peritoneal dialysis catheter placement today ased appetite fpr  REVIEW OF SYSTEMS:  Review of Systems  Constitutional: Positive for malaise/fatigue and weight loss. Negative for chills and fever.  HENT: Negative for ear discharge, ear pain, hearing loss and nosebleeds.   Eyes: Negative for blurred vision and double vision.  Respiratory: Negative for cough, shortness of breath and wheezing.   Cardiovascular: Negative for chest pain and palpitations.  Gastrointestinal: Negative for abdominal pain, constipation, diarrhea, nausea and vomiting.  Genitourinary: Negative for dysuria.  Musculoskeletal: Negative for myalgias.  Neurological: Negative for dizziness, speech change, focal weakness, seizures and headaches.  Psychiatric/Behavioral: Negative for depression.    DRUG ALLERGIES:  No Known Allergies  VITALS:  Blood pressure 138/73, pulse 79, temperature (!) 97.3 F (36.3 C), temperature source Tympanic, resp. rate (!) 22, height 5\' 2"  (1.575 m), weight 60.8 kg (134 lb 1.6 oz), SpO2 95 %.  PHYSICAL EXAMINATION:  Physical Exam  GENERAL:  76 y.o.-year-old ill nourished patient lying in the bed with no acute distress.  EYES: Pupils equal, round, reactive to light and accommodation. No scleral icterus. Extraocular muscles intact.  HEENT: Head atraumatic, normocephalic. Oropharynx and nasopharynx clear.  NECK:  Supple, no jugular venous distention. No thyroid enlargement, no tenderness.  LUNGS: Normal breath sounds bilaterally, no wheezing, rales,rhonchi or crepitation. No use of accessory muscles of respiration. Decreased at the bases CARDIOVASCULAR: S1, S2 normal. No rubs, or gallops. 2/6 systolic  murmur present ABDOMEN: Soft, nontender, nondistended. Bowel sounds present. No organomegaly or mass.  EXTREMITIES: No pedal edema, cyanosis, or clubbing.  NEUROLOGIC: Cranial nerves II through XII are intact. Muscle strength 5/5 in all extremities. Sensation intact. Gait not checked.  PSYCHIATRIC: The patient is alert and oriented x 3.  SKIN: No obvious rash, lesion, or ulcer.    LABORATORY PANEL:   CBC Recent Labs  Lab 08/13/17 2353  WBC 4.7  HGB 10.2*  HCT 31.7*  PLT 187   ------------------------------------------------------------------------------------------------------------------  Chemistries  Recent Labs  Lab 08/13/17 2353 08/15/17 0430  NA 142 143  K 5.2* 4.7  CL 111 113*  CO2 21* 21*  GLUCOSE 104* 107*  BUN 58* 57*  CREATININE 4.82* 4.80*  CALCIUM 8.6* 8.4*  MG 2.3  --    ------------------------------------------------------------------------------------------------------------------  Cardiac Enzymes Recent Labs  Lab 08/13/17 2353  TROPONINI <0.03   ------------------------------------------------------------------------------------------------------------------  RADIOLOGY:  Dg Chest 1 View  Result Date: 08/14/2017 CLINICAL DATA:  Left-sided thoracentesis. EXAM: CHEST 1 VIEW COMPARISON:  Ultrasound 08/14/2017.  Chest x-ray 08/14/2017 . FINDINGS: Mediastinum and hilar structures normal. Heart size stable. Stable bibasilar atelectasis and infiltrates. Persistent bilateral pleural effusions, left side greater than right. Interim decrease in left-sided pleural effusion since prior thoracentesis. No pneumothorax . IMPRESSION: No pneumothorax post thoracentesis. Electronically Signed   By: Maisie Fushomas  Register   On: 08/14/2017 11:15   Dg Chest 2 View  Result Date: 08/14/2017 CLINICAL DATA:  76 year old female with shortness of breath. EXAM: CHEST  2 VIEW COMPARISON:  None. FINDINGS: There is a large left hand moderate right pleural effusion with associated  compressive atelectasis of the lung bases. Pneumonia is not excluded. Clinical correlation is recommended. There is no pneumothorax. The cardiac borders are  silhouetted. There is atherosclerotic calcification of the aortic arch. There is osteopenia with degenerative changes of the spine. No acute osseous pathology. IMPRESSION: Bilateral pleural effusions, left greater than right with compressive atelectasis of the lung bases. Clinical correlation is recommended. Electronically Signed   By: Elgie CollardArash  Radparvar M.D.   On: 08/14/2017 00:46   [place] Thoracentesis Asp Pleural Space W/img Guide  Result Date: 08/14/2017 INDICATION: Shortness of breath. Left-sided pleural effusion. Request therapeutic thoracentesis. EXAM: ULTRASOUND GUIDED LEFT THORACENTESIS MEDICATIONS: None. COMPLICATIONS: None immediate. Postprocedural chest x-ray negative for pneumothorax. PROCEDURE: An ultrasound guided thoracentesis was thoroughly discussed with the patient and questions answered. The benefits, risks, alternatives and complications were also discussed. The patient understands and wishes to proceed with the procedure. Written consent was obtained. Ultrasound was performed to localize and mark an adequate pocket of fluid in the left chest. The area was then prepped and draped in the normal sterile fashion. 1% Lidocaine was used for local anesthesia. Under ultrasound guidance a Safe-T-Centesis catheter was introduced. Thoracentesis was performed. The catheter was removed and a dressing applied. FINDINGS: A total of approximately 800 mL of clear yellow fluid was removed before the patient began experiencing severe cough. Procedure was terminated at this time. IMPRESSION: Successful ultrasound guided left thoracentesis yielding 800 mL of pleural fluid. Read by: Brayton ElKevin Bruning PA-C Electronically Signed   By: Richarda OverlieAdam  Henn M.D.   On: 08/14/2017 11:30    EKG:   Orders placed or performed during the hospital encounter of 08/13/17  . ED EKG    . ED EKG  . EKG 12-Lead  . 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, hypertension, hyperlipidemia admitted with worsening shortness of breath  #1 Acute respiratory failure- secondary to pulmonary edema and pleural effusions - secondary to worsening renal disease causing fluid retention vs chf - ECHO pending - s/p left thoracentesis done and 800cc fluid removed- labs sent for cytology, differential - off o2   #2 ARF on CKD stage 5- biopsy proven hypertensive nephrosclerosis, last year gfr 11- dialysis was not imminent but considered and patient wanted to wait - now has some uremic symptoms, though makes urine- loss of appetite, weight loss and fluid build up noted - Nephrology consulted  -Plan to start dialysis, patient agreeable for only peritoneal dialysis. Appreciate vascular consult. Patient will have her peritoneal dialysis catheter placed in today.  #3 HTN- on norvasc  #4 Hyperlipidemia- on statin  #5 Hypothyroidism- synthroid  #6 DVT Prophylaxis-  SQ heparin   Physical Therapy consulted. Recommended rehab Patient can be discharged after the dialysis catheter placement today. However physical therapy recommended rehabilitation, not sure if rehabilitation will be able to transport her to nephrology office every day to get her peritoneal dialysis.    All the records are reviewed and case discussed with Care Management/Social Workerr. Management plans discussed with the patient, family and they are in agreement.  CODE STATUS: Full Code  TOTAL TIME TAKING CARE OF THIS PATIENT: 38 minutes.   POSSIBLE D/C IN ? DAYS, DEPENDING ON CLINICAL CONDITION.   Enid BaasKALISETTI,Grier Vu M.D on 08/15/2017 at 11:07 AM  Between 7am to 6pm - Pager - [phone]  After 6pm go to [email or web address] - Social research officer, governmentpassword EPAS [facility]  [facility]  Office  [phone]  CC: Primary care physician; Center, [facility]

## 2017-08-15 NOTE — Anesthesia Procedure Notes (Signed)
Procedure Name: Intubation Performed by: Lance Muss, CRNA Pre-anesthesia Checklist: Patient identified, Patient being monitored, Timeout performed, Emergency Drugs available and Suction available Patient Re-evaluated:Patient Re-evaluated prior to induction Oxygen Delivery Method: Circle system utilized Preoxygenation: Pre-oxygenation with 100% oxygen Induction Type: IV induction Ventilation: Mask ventilation without difficulty Laryngoscope Size: Mac and 3 Grade View: Grade II Tube type: Oral Tube size: 7.0 mm Number of attempts: 1 Airway Equipment and Method: Stylet Placement Confirmation: ETT inserted through vocal cords under direct vision,  positive ETCO2 and breath sounds checked- equal and bilateral Secured at: 21 cm Tube secured with: Tape Dental Injury: Teeth and Oropharynx as per pre-operative assessment

## 2017-08-15 NOTE — Discharge Summary (Signed)
[facility] - [place] at [facility]   PATIENT NAME: Ann Cunningham    MR#:  [id number]  DATE OF BIRTH:  1941-08-29  DATE OF ADMISSION:  08/13/2017   ADMITTING PHYSICIAN: Arnaldo NatalMichael S Diamond, MD  DATE OF DISCHARGE: 08/15/2017  4:36 PM  PRIMARY CARE PHYSICIAN: Center, [facility]   ADMISSION DIAGNOSIS:   Shortness of breath [R06.02] Pleural effusion [J90] Congestive heart failure, unspecified HF chronicity, unspecified heart failure type (HCC) [I50.9]  DISCHARGE DIAGNOSIS:   Active Problems:   Dyspnea   SECONDARY DIAGNOSIS:   Past Medical History:  Diagnosis Date  . CHF (congestive heart failure) (HCC)   . CKD (chronic kidney disease)   . Hypertension   . Hypothyroidism   . Renal disorder   . 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 Has diffuse hypokinesis with EF of 25-30%. - s/p left thoracentesis done and 800cc fluid removed-transudate - off o2  -Will be discharged on Lasix 80 mg daily. On Lasix drip while in the hospital. In dialysis will be started. -Outpatient follow-up with cardiology once stable  #2 ARF on CKD stage 5- biopsy proven hypertensive nephrosclerosis, last year gfr 11- dialysis was not imminent but considered and patient wanted to wait - now has some uremic symptoms, though makes urine- loss of appetite, weight loss and fluid build up noted - Nephrology consulted  -Plan to start dialysis, patient agreeable for only peritoneal dialysis. Appreciate vascular consult. -Patient had peritoneal dialysis catheter placed in today prior to discharge.  #3 HTN- on norvasc  #4 Hypothyroidism-  synthroid   Physical Therapy consulted. Recommended rehab due to generalized weakness. However we cannot find a nursing home that can take her with peritoneal dialysis. Discussed with patient and her friend Ann MaduroRobert. Patient already has a wheelchair at home and has had chronic weakness. So she will be going home with home health    DISCHARGE CONDITIONS:   Guarded  CONSULTS OBTAINED:   Treatment Team:  Mady HaagensenLateef, Munsoor, MD Schnier, Latina CraverGregory G, MD  DRUG ALLERGIES:   No Known Allergies DISCHARGE MEDICATIONS:   Allergies as of 08/15/2017   No Known Allergies     Medication List    STOP taking these medications   sodium bicarbonate 650 MG tablet     TAKE these medications   amLODipine 5 MG tablet Commonly known as:  NORVASC Take 1 tablet (5 mg total) by mouth daily. Start taking on:  08/16/2017   aspirin 81 MG tablet Take 81 mg by mouth daily.   diclofenac sodium 1 % Gel Commonly known as:  VOLTAREN Apply 2 g topically 4 (four) times daily.   furosemide 80 MG tablet Commonly known as:  LASIX Take 1 tablet (80 mg total) by mouth daily. What changed:    medication strength  how much to take   levothyroxine 25 MCG tablet Commonly known as:  SYNTHROID, LEVOTHROID Take 25 mcg by mouth daily before breakfast.   SIMBRINZA 1-0.2 % Susp Generic drug:  Brinzolamide-Brimonidine Place 1 drop into both eyes daily as needed.        DISCHARGE INSTRUCTIONS:   1. Follow-up with nephrology-appointment set up for Monday, 08/20/2017 at 9 AM  to attend first peritoneal dialysis training 2. PCP follow-up in 1-2 weeks  DIET:   Renal diet  ACTIVITY:   Activity as tolerated  OXYGEN:   Home Oxygen: No.  Oxygen Delivery: room air  DISCHARGE LOCATION:   home   If you experience worsening of your admission symptoms, develop shortness of breath, life threatening emergency, suicidal or homicidal thoughts you must seek medical attention immediately by calling 911 or  calling your MD immediately  if symptoms less severe.  You Must read complete instructions/literature along with all the possible adverse reactions/side effects for all the Medicines you take and that have been prescribed to you. Take any new Medicines after you have completely understood and accpet all the possible adverse reactions/side effects.   Please note  You were cared for by a hospitalist during your hospital stay. If you have any questions about your discharge medications or the care you received while you were in the hospital after you are discharged, you can call the unit and asked to speak with the hospitalist on call if the hospitalist that took care of you is not available. Once you are discharged, your primary care physician will handle any further medical issues. Please note that NO REFILLS for any discharge medications will be authorized once you are discharged, as it is imperative that you return to your primary care physician (or establish a relationship with a primary care physician if you do not have one) for your aftercare needs so that they can reassess your need for medications and monitor your lab values.    On the day of Discharge:  VITAL SIGNS:   Blood pressure 125/66, pulse 75, temperature 97.8 F (36.6 C), temperature source Oral, resp. rate 20, height 5\' 2"  (1.575 m), weight 60.8 kg (134 lb 1.6 oz), SpO2 94 %.  PHYSICAL EXAMINATION:   GENERAL:  76 y.o.-year-old ill nourished patient lying in the bed with no acute distress.  EYES: Pupils equal, round, reactive to light and accommodation. No scleral icterus. Extraocular muscles intact.  HEENT: Head atraumatic, normocephalic. Oropharynx and nasopharynx clear.  NECK:  Supple, no jugular venous distention. No thyroid enlargement, no tenderness.  LUNGS: Normal breath sounds bilaterally, no wheezing, rales,rhonchi or crepitation. No use of accessory muscles of respiration. Decreased at the bases CARDIOVASCULAR: S1, S2  normal. No rubs, or gallops. 2/6 systolic murmur present ABDOMEN: Soft, nontender, nondistended. Bowel sounds present. No organomegaly or mass. Peritoneal dialysis catheter in place, dressing around it EXTREMITIES: No pedal edema, cyanosis, or clubbing.  NEUROLOGIC: Cranial nerves II through XII are intact. Muscle strength 5/5 in all extremities. Sensation intact. Gait not checked.  PSYCHIATRIC: The patient is alert and oriented x 3.  SKIN: No obvious rash, lesion, or ulcer.     DATA REVIEW:   CBC Recent Labs  Lab 08/13/17 2353  WBC 4.7  HGB 10.2*  HCT 31.7*  PLT 187    Chemistries  Recent Labs  Lab 08/13/17 2353 08/15/17 0430  NA 142 143  K 5.2* 4.7  CL 111 113*  CO2 21* 21*  GLUCOSE 104* 107*  BUN 58* 57*  CREATININE 4.82* 4.80*  CALCIUM 8.6* 8.4*  MG 2.3  --      Microbiology Results  No results found for this or any previous visit.  RADIOLOGY:  No results found.   Management plans discussed with the patient, family and they are in agreement.  CODE STATUS:     Code Status Orders  (From admission, onward)  Start     Ordered   08/14/17 0500  Full code  Continuous     08/14/17 0459    Code Status History    Date Active Date Inactive Code Status Order ID Comments User Context   This patient has a current code status but no historical code status.      TOTAL TIME TAKING CARE OF THIS PATIENT: 38 minutes.    Enid BaasKALISETTI,Quetzalli Clos M.D on 08/15/2017 at 5:09 PM  Between 7am to 6pm - Pager - [phone]  After 6pm go to [email or web address] - Scientist, research (life sciences)password EPAS [facility]  [facility] [place] [facility]  Office  [phone]  CC: Primary care physician; Center, [facility]   Note: This dictation was prepared with Dragon dictation along with smaller phrase technology. Any transcriptional errors that result from this process are unintentional.

## 2017-08-15 NOTE — Care Management (Addendum)
Discharge to home with home health and physical therapy per Dr. Kalisetti. Spoke with Ms. Mahnke at the bedside. Discussed [facility] health [facility]. States "just get me a good."  Telephone to [employer] Leonard, Charity fundraiserN at [facility]. This agency can take Ms. Lorenda HatchetSlade Friend will transport Gwenette GreetBrenda S Hans Rusher RN MSN CCM Care Management [phone]

## 2017-08-15 NOTE — Evaluation (Signed)
Physical Therapy Evaluation Patient Details Name: Ann Cunningham MRN: [id number] DOB: 29-Jun-1941 Today's Date: 08/15/2017   History of Present Illness  presented to ER secondary to worsening SOB, LE edema; admitted with L > R pleural effusion. Status post L thoracentesis (11/20) with removal of 800mL fluid; pending periotoneal dialysis catheter placement this date (for initiation of outpatient dialysis).  Clinical Impression  Upon evaluation, patient alert and oriented; follows commands.  Generally frustrated with situation and health condition.  Bilat UE/LE generally weak and deconditioned; limited sitting and standing balance noted.  Currently requiring min/mod assist for bed mobility; mod assist for sit/stand, basic transfers and very short-distance gait (4 steps) with RW.  Excessive posterior weight shift, poor safety awareness; requiring constant cuing, hands-on assist at all times to prevent LOB/fall.  Unsafe/unable to attempt additional activity/distance due to fatigue and SOB (sats 90-91% on RA). Would benefit from skilled PT to address above deficits and promote optimal return to PLOF; recommend transition to STR upon discharge from acute hospitalization.     Follow Up Recommendations SNF    Equipment Recommendations       Recommendations for Other Services       Precautions / Restrictions Precautions Precautions: Fall Restrictions Weight Bearing Restrictions: No      Mobility  Bed Mobility Overal bed mobility: Needs Assistance Bed Mobility: Supine to Sit     Supine to sit: Min assist;Mod assist        Transfers Overall transfer level: Needs assistance Equipment used: Rolling walker (2 wheeled) Transfers: Sit to/from Stand Sit to Stand: Mod assist            Ambulation/Gait Ambulation/Gait assistance: Mod assist Ambulation Distance (Feet): 4 Feet Assistive device: Rolling walker (2 wheeled)       General Gait Details: forward flexed posture, excessive  posterior weight shift; poor balance; short, choppy steps with limited step height/length. Unable to attempt without hands-on assist; unable to tolerate additional distance due to SOB.  Stairs            Wheelchair Mobility    Modified Rankin (Stroke Patients Only)       Balance Overall balance assessment: Needs assistance Sitting-balance support: No upper extremity supported;Feet supported Sitting balance-Leahy Scale: Fair     Standing balance support: Bilateral upper extremity supported Standing balance-Leahy Scale: Poor                               Pertinent Vitals/Pain Pain Assessment: No/denies pain    Home Living Family/patient expects to be discharged to:: Private residence Living Arrangements: Non-relatives/Friends Available Help at Discharge: Family;Available 24 hours/day Type of Home: House Home Access: Stairs to enter Entrance Stairs-Rails: Right Entrance Stairs-Number of Steps: 6 in front with R rail, level entrance in back (requires car to be driven around to access) Home Layout: One level Home Equipment: Walker - 2 wheels;Walker - 4 wheels;Wheelchair - manual      Prior Function Level of Independence: Independent with assistive device(s)         Comments: Mod indep for ADLs, basic transfers and limited household mobility with RW; manual WC as needed.  Denies fall history.  Live in friend able to assist with ADLs, household activities as needed.     Hand Dominance        Extremity/Trunk Assessment   Upper Extremity Assessment Upper Extremity Assessment: Overall WFL for tasks assessed    Lower Extremity Assessment Lower Extremity Assessment: Generalized  weakness(grossly at least 3-/5 throughout)       Communication      Cognition Arousal/Alertness: Awake/alert Behavior During Therapy: WFL for tasks assessed/performed Overall Cognitive Status: Within Functional Limits for tasks assessed                                         General Comments      Exercises Other Exercises Other Exercises: Sit/stand with RW x3, mod assist; bed/chair tranfser with RW, mod assist.  Consistently releases grasp of RW to reach for chair; encouraged to maintain proper hand placement for optimal safety.   Assessment/Plan    PT Assessment Patient needs continued PT services  PT Problem List Decreased strength;Decreased activity tolerance;Decreased balance;Decreased mobility;Decreased coordination;Decreased cognition;Decreased knowledge of use of DME;Decreased safety awareness;Decreased knowledge of precautions;Cardiopulmonary status limiting activity       PT Treatment Interventions DME instruction;Gait training;Functional mobility training;Stair training;Therapeutic activities;Therapeutic exercise;Balance training;Patient/family education    PT Goals (Current goals can be found in the Care Plan section)  Acute Rehab PT Goals Patient Stated Goal: to get my legs stronger again PT Goal Formulation: With patient Time For Goal Achievement: 08/29/17 Potential to Achieve Goals: Good    Frequency Min 2X/week   Barriers to discharge Decreased caregiver support      Co-evaluation               AM-PAC PT "6 Clicks" Daily Activity  Outcome Measure Difficulty turning over in bed (including adjusting bedclothes, sheets and blankets)?: Unable Difficulty moving from lying on back to sitting on the side of the bed? : Unable Difficulty sitting down on and standing up from a chair with arms (e.g., wheelchair, bedside commode, etc,.)?: Unable Help needed moving to and from a bed to chair (including a wheelchair)?: A Lot Help needed walking in hospital room?: A Lot Help needed climbing 3-5 steps with a railing? : A Lot 6 Click Score: 9    End of Session Equipment Utilized During Treatment: Gait belt Activity Tolerance: Patient tolerated treatment well Patient left: in bed(leaving room with transport for  dialysis) Nurse Communication: Mobility status PT Visit Diagnosis: Unsteadiness on feet (R26.81);Muscle weakness (generalized) (M62.81);Difficulty in walking, not elsewhere classified (R26.2)    Time: [id number] PT Time Calculation (min) (ACUTE ONLY): 25 min   Charges:   PT Evaluation $PT Eval Low Complexity: 1 Low PT Treatments $Therapeutic Activity: 8-22 mins   PT G Codes:   PT G-Codes **NOT FOR INPATIENT CLASS** Functional Assessment Tool Used: AM-PAC 6 Clicks Basic Mobility Functional Limitation: Mobility: Walking and moving around Mobility: Walking and Moving Around Current Status ([id number](G8978): At least 40 percent but less than 60 percent impaired, limited or restricted Mobility: Walking and Moving Around Goal Status [phone](G8979): At least 1 percent but less than 20 percent impaired, limited or restricted    Ann Cunningham, PT, DPT, NCS 08/15/17, 10:36 AM [phone]

## 2017-08-15 NOTE — Transfer of Care (Signed)
Immediate Anesthesia Transfer of Care Note  Patient: Ann Cunningham  Procedure(s) Performed: LAPAROSCOPIC INSERTION CONTINUOUS AMBULATORY PERITONEAL DIALYSIS  (CAPD) CATHETER (N/A )  Patient Location: PACU  Anesthesia Type:General  Level of Consciousness: awake and responds to stimulation  Airway & Oxygen Therapy: Patient Spontanous Breathing and Patient connected to face mask oxygen  Post-op Assessment: Report given to RN and Post -op Vital signs reviewed and stable  Post vital signs: Reviewed and stable  Last Vitals:  Vitals:   08/15/17 1223 08/15/17 1225  BP:  (!) 157/69  Pulse:  100  Resp:  20  Temp: (P) 36.7 C   SpO2:  96%    Last Pain:  Vitals:   08/15/17 1027  TempSrc: Tympanic         Complications: No apparent anesthesia complications

## 2017-08-15 NOTE — OR Nursing (Signed)
Patient with some intermittent shallow breathing but over all comfortable with her breathing. Sr. Mining engineerchneir into see patient and she agrees to proceed with PD catheter.

## 2017-08-15 NOTE — Anesthesia Postprocedure Evaluation (Signed)
Anesthesia Post Note  Patient: Ann Cunningham  Procedure(Cunningham) Performed: LAPAROSCOPIC INSERTION CONTINUOUS AMBULATORY PERITONEAL DIALYSIS  (CAPD) CATHETER (N/A )  Patient location during evaluation: PACU Anesthesia Type: General Level of consciousness: awake and alert Pain management: pain level controlled Vital Signs Assessment: post-procedure vital signs reviewed and stable Respiratory status: spontaneous breathing, nonlabored ventilation, respiratory function stable and patient connected to nasal cannula oxygen Cardiovascular status: blood pressure returned to baseline and stable Postop Assessment: no apparent nausea or vomiting Anesthetic complications: no     Last Vitals:  Vitals:   08/15/17 1253 08/15/17 1308  BP: 127/63 (!) 114/56  Pulse: 92 78  Resp: (!) 29 (!) 22  Temp:  (!) 36.4 C  SpO2: 90% 93%    Last Pain:  Vitals:   08/15/17 1308  TempSrc:   PainSc: 0-No pain                 Ann Cunningham

## 2017-08-15 NOTE — Progress Notes (Signed)
Took over pt care at 1500, pt with no complaints, resting quietly, friend at bedside

## 2017-08-15 NOTE — Anesthesia Preprocedure Evaluation (Signed)
Anesthesia Evaluation  Patient identified by MRN, date of birth, ID band Patient awake    Reviewed: Allergy & Precautions, NPO status , Patient's Chart, lab work & pertinent test results, reviewed documented beta blocker date and time   Airway Mallampati: III  TM Distance: >3 FB     Dental  (+) Chipped   Pulmonary shortness of breath, former smoker,           Cardiovascular hypertension, Pt. on medications +CHF       Neuro/Psych    GI/Hepatic   Endo/Other  Hypothyroidism   Renal/GU ESRFRenal disease     Musculoskeletal   Abdominal   Peds  Hematology   Anesthesia Other Findings   Reproductive/Obstetrics                             Anesthesia Physical Anesthesia Plan  ASA: III  Anesthesia Plan: General   Post-op Pain Management:    Induction: Intravenous  PONV Risk Score and Plan:   Airway Management Planned: Oral ETT  Additional Equipment:   Intra-op Plan:   Post-operative Plan:   Informed Consent: I have reviewed the patients History and Physical, chart, labs and discussed the procedure including the risks, benefits and alternatives for the proposed anesthesia with the patient or authorized representative who has indicated his/her understanding and acceptance.     Plan Discussed with: CRNA  Anesthesia Plan Comments:         Anesthesia Quick Evaluation

## 2017-08-15 NOTE — Op Note (Signed)
  OPERATIVE NOTE   PROCEDURE: 1. Laparoscopic peritoneal dialysis catheter placement.  PRE-OPERATIVE DIAGNOSIS: 1.  End-stage renal disease 2.  Uremia  POST-OPERATIVE DIAGNOSIS: Same  SURGEON: Levora DredgeGregory Tameria Patti  ASSISTANT(S): None  ANESTHESIA: general  ESTIMATED BLOOD LOSS: Minimal   FINDING(S): 1. None  SPECIMEN(S): None  INDICATIONS:  Ann Cunningham is a 76 y.o. female who presents with worsening kidney function and volume overload. The patient has decided to do peritoneal dialysis for his long-term dialysis. Risks and benefits of placement were discussed and he is agreeable to proceed.  Differences between peritoneal dialysis and hemodialysis were discussed.    DESCRIPTION:  After obtaining full informed written consent, the patient was brought back to the operating room and placed supine upon the operating table. The patient received IV antibiotics prior to induction. After obtaining adequate anesthesia, the abdomen was prepped and draped in the standard fashion.   A small vertical incision was made in the midline just above the umbilicus and the dissection was carried down through the soft tissue to expose the fascia. Two 0 Vicryl sutures were then used to secure the fascia just lateral to the midline on both sides and an 15 blade was used to incise the fascia. The fascia is then elevated with a bone hook Varies needle is introduced and a saline test was performed indicating intraperitoneal location and insufflated of the abdomen with carbon dioxide is performed to 15 mmHg pressure. A 5 mm trocar is then placed again maintaining elevation of the fascia with a bone hook.  A oblique incision is then made in the left lower quadrant to the lateral portion of the rectus muscle the dissection is carried down through the soft tissues and the fascia is exposed. Small incision is made in the fascia with the Bovie and a pursestring suture of 0 Vicryl is placed. Upon initial attempt of  placing the Seldinger needle the adhesions are obstructing the catheter positioning and view of the placement and therefore they must be removed.  Seldinger needle was then inserted under direct visualization and the wire introduced under the view of the camera trocar was then placed and the catheter was advanced into the abdominal cavity over the trocar. Under direct visualization the catheters curled portion was positioned deep into the pelvis just to the right of midline. It was irrigated with heparinized saline. The deep cuff was secured to the fascial pursestring suture. A small counterincision was made in the right upper quadrant of the abdomen and the catheter was brought out this site. The appropriate distal connectors were placed, and I then placed 300 cc of saline through the catheter into the pelvis. The abdomen was desufflated. Immediately, 250 cc of effluent returned through the catheter when the bag was placed to gravity. I took one more look with the camera to ensure that the catheter was in the pelvis and it was. The hasson trocar was then removed. I then closed the incisions with a 2-0 Vicryl in the right lower quadrant incision and subsequently 3-0 Vicryl and 4-0 Monocryl, the other incisions were closed with 3-0 Vicryl and 4-0 Monocryl and placed Dermabond as dressing. Dry dressing was placed around the catheter exit site. The patient was then awakened from anesthesia and taken to the recovery room in stable condition having tolerated the procedure well.   COMPLICATIONS: None  CONDITION: None  Levora DredgeGregory Hanna Ra 08/15/2017, 12:15 PM

## 2017-08-16 ENCOUNTER — Encounter: Payer: Self-pay | Admitting: Vascular Surgery

## 2017-08-16 LAB — HEPATITIS B SURFACE ANTIBODY,QUALITATIVE: HEP B S AB: NONREACTIVE

## 2017-08-16 LAB — HEPATITIS B CORE ANTIBODY, IGM: Hep B C IgM: NEGATIVE

## 2017-08-16 LAB — HEPATITIS B SURFACE ANTIGEN: Hepatitis B Surface Ag: NEGATIVE

## 2017-08-17 ENCOUNTER — Inpatient Hospital Stay
Admission: EM | Admit: 2017-08-17 | Discharge: 2017-08-22 | DRG: 981 | Disposition: A | Discharge status: Skilled Nursing Facility | Payer: Medicare Other | Attending: Internal Medicine | Admitting: Internal Medicine

## 2017-08-17 ENCOUNTER — Other Ambulatory Visit: Payer: Self-pay

## 2017-08-17 ENCOUNTER — Encounter: Payer: Self-pay | Admitting: Emergency Medicine

## 2017-08-17 ENCOUNTER — Emergency Department: Payer: Medicare Other

## 2017-08-17 DIAGNOSIS — I5022 Chronic systolic (congestive) heart failure: Secondary | ICD-10-CM | POA: Diagnosis present

## 2017-08-17 DIAGNOSIS — J918 Pleural effusion in other conditions classified elsewhere: Secondary | ICD-10-CM | POA: Diagnosis present

## 2017-08-17 DIAGNOSIS — Z9889 Other specified postprocedural states: Secondary | ICD-10-CM

## 2017-08-17 DIAGNOSIS — R0602 Shortness of breath: Secondary | ICD-10-CM

## 2017-08-17 DIAGNOSIS — Z6823 Body mass index (BMI) 23.0-23.9, adult: Secondary | ICD-10-CM | POA: Diagnosis not present

## 2017-08-17 DIAGNOSIS — Z992 Dependence on renal dialysis: Secondary | ICD-10-CM | POA: Diagnosis not present

## 2017-08-17 DIAGNOSIS — Y838 Other surgical procedures as the cause of abnormal reaction of the patient, or of later complication, without mention of misadventure at the time of the procedure: Secondary | ICD-10-CM | POA: Diagnosis not present

## 2017-08-17 DIAGNOSIS — Z79890 Hormone replacement therapy (postmenopausal): Secondary | ICD-10-CM | POA: Diagnosis not present

## 2017-08-17 DIAGNOSIS — R531 Weakness: Secondary | ICD-10-CM

## 2017-08-17 DIAGNOSIS — Z7982 Long term (current) use of aspirin: Secondary | ICD-10-CM | POA: Diagnosis not present

## 2017-08-17 DIAGNOSIS — E039 Hypothyroidism, unspecified: Secondary | ICD-10-CM | POA: Diagnosis present

## 2017-08-17 DIAGNOSIS — Z87891 Personal history of nicotine dependence: Secondary | ICD-10-CM | POA: Diagnosis not present

## 2017-08-17 DIAGNOSIS — R627 Adult failure to thrive: Secondary | ICD-10-CM | POA: Diagnosis present

## 2017-08-17 DIAGNOSIS — N186 End stage renal disease: Secondary | ICD-10-CM | POA: Diagnosis present

## 2017-08-17 DIAGNOSIS — Z66 Do not resuscitate: Secondary | ICD-10-CM | POA: Diagnosis present

## 2017-08-17 DIAGNOSIS — J95811 Postprocedural pneumothorax: Secondary | ICD-10-CM | POA: Diagnosis not present

## 2017-08-17 DIAGNOSIS — D631 Anemia in chronic kidney disease: Secondary | ICD-10-CM | POA: Diagnosis present

## 2017-08-17 DIAGNOSIS — I429 Cardiomyopathy, unspecified: Secondary | ICD-10-CM | POA: Diagnosis present

## 2017-08-17 DIAGNOSIS — N179 Acute kidney failure, unspecified: Secondary | ICD-10-CM | POA: Diagnosis present

## 2017-08-17 DIAGNOSIS — I132 Hypertensive heart and chronic kidney disease with heart failure and with stage 5 chronic kidney disease, or end stage renal disease: Secondary | ICD-10-CM | POA: Diagnosis present

## 2017-08-17 DIAGNOSIS — J9600 Acute respiratory failure, unspecified whether with hypoxia or hypercapnia: Secondary | ICD-10-CM | POA: Diagnosis present

## 2017-08-17 DIAGNOSIS — N2581 Secondary hyperparathyroidism of renal origin: Secondary | ICD-10-CM | POA: Diagnosis present

## 2017-08-17 DIAGNOSIS — J90 Pleural effusion, not elsewhere classified: Secondary | ICD-10-CM

## 2017-08-17 DIAGNOSIS — E43 Unspecified severe protein-calorie malnutrition: Secondary | ICD-10-CM | POA: Diagnosis present

## 2017-08-17 LAB — CBC WITH DIFFERENTIAL/PLATELET
BASOS ABS: 0.1 10*3/uL (ref 0–0.1)
BASOS PCT: 1 %
Eosinophils Absolute: 0 10*3/uL (ref 0–0.7)
Eosinophils Relative: 1 %
HEMATOCRIT: 26 % — AB (ref 35.0–47.0)
Hemoglobin: 8.5 g/dL — ABNORMAL LOW (ref 12.0–16.0)
Lymphocytes Relative: 14 %
Lymphs Abs: 1 10*3/uL (ref 1.0–3.6)
MCH: 29 pg (ref 26.0–34.0)
MCHC: 32.7 g/dL (ref 32.0–36.0)
MCV: 88.6 fL (ref 80.0–100.0)
MONO ABS: 0.6 10*3/uL (ref 0.2–0.9)
Monocytes Relative: 9 %
NEUTROS ABS: 5.5 10*3/uL (ref 1.4–6.5)
NEUTROS PCT: 75 %
Platelets: 187 10*3/uL (ref 150–440)
RBC: 2.94 MIL/uL — ABNORMAL LOW (ref 3.80–5.20)
RDW: 15 % — AB (ref 11.5–14.5)
WBC: 7.3 10*3/uL (ref 3.6–11.0)

## 2017-08-17 LAB — BASIC METABOLIC PANEL
Anion gap: 14 (ref 5–15)
BUN: 73 mg/dL — ABNORMAL HIGH (ref 6–20)
CHLORIDE: 107 mmol/L (ref 101–111)
CO2: 20 mmol/L — AB (ref 22–32)
CREATININE: 5.29 mg/dL — AB (ref 0.44–1.00)
Calcium: 8.3 mg/dL — ABNORMAL LOW (ref 8.9–10.3)
GFR calc non Af Amer: 7 mL/min — ABNORMAL LOW (ref >60)
GFR, EST AFRICAN AMERICAN: 8 mL/min — AB (ref >60)
Glucose, Bld: 84 mg/dL (ref 65–99)
Potassium: 4.7 mmol/L (ref 3.5–5.1)
Sodium: 141 mmol/L (ref 135–145)

## 2017-08-17 MED ORDER — BRINZOLAMIDE 1 % OP SUSP
1.0000 [drp] | Freq: Every day | OPHTHALMIC | Status: DC | PRN
  Filled 2017-08-17: qty 10

## 2017-08-17 MED ORDER — AMLODIPINE BESYLATE 5 MG PO TABS
5.0000 mg | ORAL_TABLET | Freq: Every day | ORAL | Status: DC
  Administered 2017-08-19: 5 mg via ORAL
  Filled 2017-08-17: qty 1

## 2017-08-17 MED ORDER — HEPARIN SODIUM (PORCINE) 5000 UNIT/ML IJ SOLN
5000.0000 [IU] | Freq: Three times a day (TID) | INTRAMUSCULAR | Status: DC
  Administered 2017-08-17 – 2017-08-22 (×13): 5000 [IU] via SUBCUTANEOUS
  Filled 2017-08-17 (×13): qty 1

## 2017-08-17 MED ORDER — LEVOTHYROXINE SODIUM 50 MCG PO TABS
25.0000 ug | ORAL_TABLET | Freq: Every day | ORAL | Status: DC
  Administered 2017-08-18 – 2017-08-22 (×4): 25 ug via ORAL
  Filled 2017-08-17 (×4): qty 1

## 2017-08-17 MED ORDER — ASPIRIN EC 81 MG PO TBEC
81.0000 mg | DELAYED_RELEASE_TABLET | Freq: Every day | ORAL | Status: DC
  Administered 2017-08-18 – 2017-08-19 (×2): 81 mg via ORAL
  Filled 2017-08-17 (×5): qty 1

## 2017-08-17 MED ORDER — ACETAMINOPHEN 650 MG RE SUPP: 650.0000 mg | Freq: Four times a day (QID) | RECTAL | Status: DC | PRN

## 2017-08-17 MED ORDER — BRIMONIDINE TARTRATE 0.2 % OP SOLN
1.0000 [drp] | Freq: Every day | OPHTHALMIC | Status: DC | PRN
  Filled 2017-08-17: qty 5

## 2017-08-17 MED ORDER — FUROSEMIDE 40 MG PO TABS
80.0000 mg | ORAL_TABLET | Freq: Every day | ORAL | Status: DC
  Administered 2017-08-18 – 2017-08-19 (×2): 80 mg via ORAL
  Filled 2017-08-17 (×3): qty 2

## 2017-08-17 MED ORDER — DICLOFENAC SODIUM 1 % TD GEL
2.0000 g | Freq: Four times a day (QID) | TRANSDERMAL | Status: DC
  Administered 2017-08-17 – 2017-08-21 (×8): 2 g via TOPICAL
  Filled 2017-08-17: qty 100

## 2017-08-17 MED ORDER — ACETAMINOPHEN 325 MG PO TABS
650.0000 mg | ORAL_TABLET | Freq: Four times a day (QID) | ORAL | Status: DC | PRN
  Administered 2017-08-21: 650 mg via ORAL
  Filled 2017-08-17: qty 2

## 2017-08-17 NOTE — ED Notes (Signed)
Hospitalist in to see patient at this time.

## 2017-08-17 NOTE — ED Provider Notes (Signed)
[facility] Emergency Department Provider Note  ____________________________________________   I have reviewed the triage vital signs and the nursing notes.   HISTORY  Chief Complaint Failure To Thrive   History limited by: Not Limited   HPI Ann Cunningham is a 76 y.o. female who presents to the emergency department today because of concern for inability to care for herself at home. The patient was recently discharged from the hospital secondary to respiratory distress due to pulmonary edema and plural effusions. While here she had a thoracentesis performed. In addition she was found to be in acute renal failure. She had a peritoneal catheter placed. Per nephrology note this was the only type of catheter she was agreeable to. However apparently after this was placed it became apparent that no SNF in the area would take the patient. PT did recommend rehab. Patient was sent home with home health. Home health evaluated the patient today and given that patient could not perform ADLs could not admit patient to their service. At this time patient does state that had she known she would not have been able to be placed she would not have chosen the peritoneal catheter.    Per medical record review patient has a history of recent admission and peritoneal dialysis catheter placement.   Past Medical History:  Diagnosis Date  . CHF (congestive heart failure) (HCC)   . CKD (chronic kidney disease)   . Hypertension   . Hypothyroidism   . Renal disorder   . Uses walker     Patient Active Problem List   Diagnosis Date Noted  . Dyspnea 08/14/2017    Past Surgical History:  Procedure Laterality Date  . CAPD INSERTION N/A 08/15/2017   Procedure: LAPAROSCOPIC INSERTION CONTINUOUS AMBULATORY PERITONEAL DIALYSIS  (CAPD) CATHETER;  Surgeon: Renford DillsSchnier, Gregory G, MD;  Location: [facility] ORS;  Service: Vascular;  Laterality: N/A;  . CATARACT EXTRACTION W/ INTRAOCULAR LENS IMPLANT    .  CATARACT EXTRACTION W/PHACO Right 12/25/2016   Procedure: CATARACT EXTRACTION PHACO AND INTRAOCULAR LENS PLACEMENT (IOC)  Right complicated;  Surgeon: Sherald HessAnita Prakash Vin-Parikh, MD;  [facility];  Service: Ophthalmology;  Laterality: Right;  Vision Blue    Prior to Admission medications   Medication Sig Start Date End Date Taking? Authorizing Provider  amLODipine (NORVASC) 5 MG tablet Take 1 tablet (5 mg total) by mouth daily. 08/16/17   Enid BaasKalisetti, Radhika, MD  aspirin 81 MG tablet Take 81 mg by mouth daily.    [provider]  diclofenac sodium (VOLTAREN) 1 % GEL Apply 2 g topically 4 (four) times daily. 01/04/17   Phineas SemenGoodman, Eathel Pajak, MD  furosemide (LASIX) 80 MG tablet Take 1 tablet (80 mg total) by mouth daily. 08/15/17 10/14/17  Enid BaasKalisetti, Radhika, MD  levothyroxine (SYNTHROID, LEVOTHROID) 25 MCG tablet Take 25 mcg by mouth daily before breakfast.    [provider]  SIMBRINZA 1-0.2 % SUSP Place 1 drop into both eyes daily as needed. 07/25/17   [provider]    Allergies Patient has no known allergies.  Family History  Problem Relation Age of Onset  . Hypertension Mother     Social History Social History   Tobacco Use  . Smoking status: Former Smoker    Packs/day: 0.50    Types: Cigarettes    Last attempt to quit: 06/14/2017    Years since quitting: 0.1  . Smokeless tobacco: Current User    Types: Snuff  Substance Use Topics  . Alcohol use: No  .  Drug use: No    Review of Systems Constitutional: No fever/chills Eyes: No visual changes. ENT: No sore throat. Cardiovascular: Denies chest pain. Respiratory: Denies shortness of breath. Gastrointestinal: No abdominal pain.  No nausea, no vomiting.  No diarrhea.   Genitourinary: Negative for dysuria. Musculoskeletal: Negative for back pain. Skin: Negative for rash. Neurological: Negative for headaches, focal weakness or  numbness.  ____________________________________________   PHYSICAL EXAM:  VITAL SIGNS: ED Triage Vitals  Enc Vitals Group     BP 08/17/17 1935 (!) 147/52     Pulse Rate 08/17/17 1935 99     Resp 08/17/17 1935 20     Temp 08/17/17 1935 98.5 F (36.9 C)     Temp Source 08/17/17 1935 Oral     SpO2 08/17/17 1935 95 %     Weight 08/17/17 1935 143 lb 12.8 oz (65.2 kg)     Height 08/17/17 1935 5\' 5"  (1.651 m)     Head Circumference --      Peak Flow --      Pain Score 08/17/17 1934 0   Constitutional: Alert and oriented.  Eyes: Conjunctivae are normal.  ENT   Head: Normocephalic and atraumatic.   Nose: No congestion/rhinnorhea.   Mouth/Throat: Mucous membranes are moist.   Neck: No stridor. Hematological/Lymphatic/Immunilogical: No cervical lymphadenopathy. Cardiovascular: Normal rate, regular rhythm.  No murmurs, rubs, or gallops.  Respiratory: Normal respiratory effort without tachypnea nor retractions. Breath sounds are clear and equal bilaterally. No wheezes/rales/rhonchi. Gastrointestinal: Soft and non tender. No rebound. No guarding.  Genitourinary: Deferred Musculoskeletal: Normal range of motion in all extremities. No lower extremity edema. Neurologic:  Normal speech and language. No gross focal neurologic deficits are appreciated.  Skin:  Skin is warm, dry. Peritoneal catheter site noted in abdomen.  Psychiatric: Mood and affect are normal. Speech and behavior are normal. Patient exhibits appropriate insight and judgment.  ____________________________________________    LABS (pertinent positives/negatives)  Bmp k 4.7, cr 5.29 CBC hgb 8.5, wbc 7.3  ____________________________________________   EKG  None  ____________________________________________    RADIOLOGY  CXR Bilateral pleural effusions, left greater than right  I, Saintclair Schroader, personally viewed and evaluated these images (plain radiographs) as part of my medical decision  making. ____________________________________________   PROCEDURES  Procedures  ____________________________________________   INITIAL IMPRESSION / ASSESSMENT AND PLAN / ED COURSE  Pertinent labs & imaging results that were available during my care of the patient were reviewed by me and considered in my medical decision making (see chart for details).  Patient presented to the emergency department today because of concerns for inability to take care of herself in her house.  Patient does complain of significant weakness.  Was discharged from the hospital 2 days ago.  Had been set up for home health peritoneal dialysis.  Home health did not feel they could accept the patient given the patient's lack of ability to perform ADLs.  Workup here does show pleural effusions.  She did have a large thoracentesis performed and I do have concerns given reaccumulation.  Will plan on admission to the hospital service for further workup and management.  ____________________________________________   FINAL CLINICAL IMPRESSION(S) / ED DIAGNOSES  Final diagnoses:  Weakness  Pleural effusion     Note: This dictation was prepared with Dragon dictation. Any transcriptional errors that result from this process are unintentional     Phineas SemenGoodman, Hilery Wintle, MD 08/17/17 2106

## 2017-08-17 NOTE — H&P (Signed)
[facility]   PATIENT NAME: Ann CoolerWilma Leaman    MR#:  [id number]  DATE OF BIRTH:  11-23-1940  DATE OF ADMISSION:  08/17/2017  PRIMARY CARE PHYSICIAN: [facility]   REQUESTING/REFERRING PHYSICIAN: Dr Derrill KayGoodman  CHIEF COMPLAINT:   Chief Complaint  Patient presents with  . Failure To Thrive    HISTORY OF PRESENT ILLNESS:  Ann Cunningham  is a 76 y.o. female was directed back to the hospital by home health nursing.  Patient can't do her activities of daily living.  Patient having a lot of difficulty getting in and out of bed.  Patient having pain in her abdomen.  Patient lives with her friend but states her friend does not want to help her anymore.  Last hospitalization physical therapy recommended rehab.  The rehab facilities were giving issue with the patient with a PD catheter.  In speaking with nephrology the patient can go to the dialysis center to get PD catheter dialysis there.  The facilities would not have to deal with a catheter.  Patient not eating.  Having abdominal pain since the procedure.  Still having shortness of breath.  PAST MEDICAL HISTORY:   Past Medical History:  Diagnosis Date  . CHF (congestive heart failure) (HCC)   . CKD (chronic kidney disease)   . Hypertension   . Hypothyroidism   . Renal disorder   . Uses walker     PAST SURGICAL HISTORY:   Past Surgical History:  Procedure Laterality Date  . CAPD INSERTION N/A 08/15/2017   Procedure: LAPAROSCOPIC INSERTION CONTINUOUS AMBULATORY PERITONEAL DIALYSIS  (CAPD) CATHETER;  Surgeon: Renford DillsSchnier, Gregory G, MD;  Location: [facility] ORS;  Service: Vascular;  Laterality: N/A;  . CATARACT EXTRACTION W/ INTRAOCULAR LENS IMPLANT    . CATARACT EXTRACTION W/PHACO Right 12/25/2016   Procedure: CATARACT EXTRACTION PHACO AND INTRAOCULAR LENS PLACEMENT (IOC)  Right complicated;  Surgeon: Sherald HessAnita Prakash Vin-Parikh, MD;  [facility];  Service:  Ophthalmology;  Laterality: Right;  Vision Blue    SOCIAL HISTORY:   Social History   Tobacco Use  . Smoking status: Former Smoker    Packs/day: 0.50    Types: Cigarettes    Last attempt to quit: 06/14/2017    Years since quitting: 0.1  . Smokeless tobacco: Current User    Types: Snuff  Substance Use Topics  . Alcohol use: No    FAMILY HISTORY:   Family History  Problem Relation Age of Onset  . Hypertension Mother     DRUG ALLERGIES:  No Known Allergies  REVIEW OF SYSTEMS:  CONSTITUTIONAL: No fever.  Positive for fatigue and weakness.  Positive for tremendous weight loss.  No appetite. EYES: No blurred or double vision.  Wears glasses. EARS, NOSE, AND THROAT: No tinnitus or ear pain. No sore throat RESPIRATORY: Positive for cough and shortness of breath, no wheezing or hemoptysis.  CARDIOVASCULAR: No chest pain, orthopnea, edema.  GASTROINTESTINAL: No nausea, vomiting.  Positive for diarrhea and abdominal pain. No blood in bowel movements GENITOURINARY: No dysuria, hematuria.  ENDOCRINE: Positive for hypothyroidism HEMATOLOGY: No anemia, easy bruising or bleeding SKIN: No rash or lesion. MUSCULOSKELETAL: No joint pain or arthritis.   NEUROLOGIC: No tingling, numbness, weakness.  PSYCHIATRY: No anxiety or depression.   MEDICATIONS AT HOME:   Prior to Admission medications   Medication Sig Start Date End Date Taking? Authorizing Provider  amLODipine (NORVASC) 5 MG tablet Take 1 tablet (5 mg total) by mouth daily. 08/16/17  Yes Enid BaasKalisetti, Radhika, MD  aspirin 81 MG tablet Take 81 mg by mouth daily.   Yes [provider]  diclofenac sodium (VOLTAREN) 1 % GEL Apply 2 g topically 4 (four) times daily. 01/04/17  Yes Phineas SemenGoodman, Graydon, MD  furosemide (LASIX) 80 MG tablet Take 1 tablet (80 mg total) by mouth daily. 08/15/17 10/14/17 Yes Enid BaasKalisetti, Radhika, MD  levothyroxine (SYNTHROID, LEVOTHROID) 25 MCG tablet Take 25 mcg by mouth daily before breakfast.   Yes  [provider]  SIMBRINZA 1-0.2 % SUSP Place 1 drop into both eyes daily as needed. 07/25/17   [provider]      VITAL SIGNS:  Blood pressure (!) 147/52, pulse 99, temperature 98.5 F (36.9 C), temperature source Oral, resp. rate 20, height 5\' 5"  (1.651 m), weight 65.2 kg (143 lb 12.8 oz), SpO2 95 %.  PHYSICAL EXAMINATION:  GENERAL:  76 y.o.-year-old patient lying in the bed with no acute distress.  EYES: Pupils equal, round, reactive to light and accommodation. No scleral icterus. Extraocular muscles intact.  HEENT: Head atraumatic, normocephalic. Oropharynx and nasopharynx clear.  NECK:  Supple, no jugular venous distention. No thyroid enlargement, no tenderness.  LUNGS: Decreased  breath sounds left base, no wheezing, rales,rhonchi or crepitation. No use of accessory muscles of respiration.  CARDIOVASCULAR: S1, S2 normal. No murmurs, rubs, or gallops.  ABDOMEN: Soft, tender, nondistended. Bowel sounds present. No organomegaly or mass.  Some bleeding around catheter site EXTREMITIES: No pedal edema, cyanosis, or clubbing.  NEUROLOGIC: Cranial nerves II through XII are intact. Muscle strength 4/5 in all extremities. Sensation intact. Gait not checked.  PSYCHIATRIC: The patient is alert and oriented x 3.  SKIN: No rash, lesion, or ulcer.   LABORATORY PANEL:   CBC Recent Labs  Lab 08/17/17 2021  WBC 7.3  HGB 8.5*  HCT 26.0*  PLT 187   ------------------------------------------------------------------------------------------------------------------  Chemistries  Recent Labs  Lab 08/13/17 2353  08/17/17 2021  NA 142   < > 141  K 5.2*   < > 4.7  CL 111   < > 107  CO2 21*   < > 20*  GLUCOSE 104*   < > 84  BUN 58*   < > 73*  CREATININE 4.82*   < > 5.29*  CALCIUM 8.6*   < > 8.3*  MG 2.3  --   --    < > = values in this interval not displayed.    ------------------------------------------------------------------------------------------------------------------  Cardiac Enzymes Recent Labs  Lab 08/13/17 2353  TROPONINI <0.03   ------------------------------------------------------------------------------------------------------------------  RADIOLOGY:  Dg Chest 1 View  Result Date: 08/17/2017 CLINICAL DATA:  Weakness. EXAM: CHEST 1 VIEW COMPARISON:  Radiograph of August 14, 2017. FINDINGS: Stable cardiomediastinal silhouette. Stable large left pleural effusion is noted with probable underlying atelectasis or edema. Mild right pleural effusion is noted with associated atelectasis or edema. No pneumothorax is noted. Narrowing of the subacromial space is noted bilaterally. IMPRESSION: Bilateral pleural effusions are noted with associated atelectasis or edema, left greater than right. Electronically Signed   By: Lupita RaiderJames  Green Jr, M.D.   On: 08/17/2017 20:09      IMPRESSION AND PLAN:   1.  Failure to thrive at home.  Unable to do activities of daily living.  Poor appetite and weight loss.  Patient would benefit from nursing home facility. 2.  Chronic kidney disease progressed to end-stage renal disease.  PD catheter placed last hospitalization.  Case discussed with Dr. Cherylann RatelLateef to speak with the patient. 3.  Essential  hypertension on Norvasc 4.  Left pleural effusion reaccumulated.  Patient had thoracentesis last time. 5.  History of systolic congestive heart failure with cardiomyopathy.  On Lasix.  Consider low-dose beta-blocker.  No ACE inhibitor right at this point with kidney function worsening.  All the records are reviewed and case discussed with ED provider. Management plans discussed with the patient, family and they are in agreement.  CODE STATUS: DNR  TOTAL TIME TAKING CARE OF THIS PATIENT: 50 minutes.    Alford Highland M.D on 08/17/2017 at 9:24 PM  Between 7am to 6pm - Pager - [phone]  After 6pm call  admission pager [phone]  [facility] Office  [phone]  CC: Primary care physician; [facility]

## 2017-08-17 NOTE — ED Notes (Signed)
[facility] called they are aware of patients situation. They would like pt to be admitted because it is unsafe for pt to go home. She is unable to do her ADL's and family is unwilling to help.

## 2017-08-17 NOTE — ED Triage Notes (Signed)
Pt brought in from home via [facility]. She was discharged on Wednesday after having a peritoneal dialysis cath placed. She got home and was unable to perform her ADLS and her peritoneal dialysis. Home Health RN sent pt here for placement it is unsafe for pt to be at home

## 2017-08-18 ENCOUNTER — Encounter: Payer: Self-pay | Admitting: *Deleted

## 2017-08-18 LAB — BASIC METABOLIC PANEL
Anion gap: 9 (ref 5–15)
BUN: 70 mg/dL — AB (ref 6–20)
CALCIUM: 7.9 mg/dL — AB (ref 8.9–10.3)
CHLORIDE: 110 mmol/L (ref 101–111)
CO2: 21 mmol/L — ABNORMAL LOW (ref 22–32)
CREATININE: 5.3 mg/dL — AB (ref 0.44–1.00)
GFR calc Af Amer: 8 mL/min — ABNORMAL LOW (ref >60)
GFR, EST NON AFRICAN AMERICAN: 7 mL/min — AB (ref >60)
GLUCOSE: 92 mg/dL (ref 65–99)
Potassium: 4.5 mmol/L (ref 3.5–5.1)
SODIUM: 140 mmol/L (ref 135–145)

## 2017-08-18 LAB — QUANTIFERON-TB GOLD PLUS (RQFGPL)
QUANTIFERON NIL VALUE: 0.02 [IU]/mL
QUANTIFERON TB2 AG VALUE: 0.02 [IU]/mL
QuantiFERON Mitogen Value: 2.65 IU/mL
QuantiFERON TB1 Ag Value: 0.02 IU/mL

## 2017-08-18 LAB — QUANTIFERON-TB GOLD PLUS: QUANTIFERON-TB GOLD PLUS: NEGATIVE

## 2017-08-18 LAB — CBC
HEMATOCRIT: 23.3 % — AB (ref 35.0–47.0)
Hemoglobin: 7.8 g/dL — ABNORMAL LOW (ref 12.0–16.0)
MCH: 29.4 pg (ref 26.0–34.0)
MCHC: 33.5 g/dL (ref 32.0–36.0)
MCV: 87.9 fL (ref 80.0–100.0)
PLATELETS: 180 10*3/uL (ref 150–440)
RBC: 2.65 MIL/uL — ABNORMAL LOW (ref 3.80–5.20)
RDW: 15.1 % — AB (ref 11.5–14.5)
WBC: 6.4 10*3/uL (ref 3.6–11.0)

## 2017-08-18 MED ORDER — HEPARIN 1000 UNIT/ML FOR PERITONEAL DIALYSIS
500.0000 [IU] | INTRAMUSCULAR | Status: DC | PRN
  Filled 2017-08-18: qty 0.5

## 2017-08-18 MED ORDER — GENTAMICIN SULFATE 0.1 % EX CREA
1.0000 "application " | TOPICAL_CREAM | Freq: Every day | CUTANEOUS | Status: DC
  Administered 2017-08-18: 1 via TOPICAL
  Filled 2017-08-18: qty 15

## 2017-08-18 NOTE — Clinical Social Work Note (Signed)
Clinical Social Work Assessment  Patient Details  Name: Ann Cunningham MRN: [id number] Date of Birth: April 19, 1941  Date of referral:  08/18/17               Reason for consult:  Facility Placement                Permission sought to share information with:  Facility Art therapist granted to share information::  Yes, Verbal Permission Granted  Name::        Agency::     Relationship::     Contact Information:     Housing/Transportation Living arrangements for the past 2 months:  [place] of Information:  Patient, Medical Team, Other (Comment Required), Friend/Neighbor(Family supports) Patient Interpreter Needed:  None Criminal Activity/Legal Involvement Pertinent to Current Situation/Hospitalization:  No - Comment as needed Significant Relationships:  Other Family Members, Community Support, [place] Lives with:  Self Do you feel safe going back to the place where you live?  Yes Need for family participation in patient care:  No (Coment)  Care giving concerns:  PT recommendation for STR   Social Worker assessment / plan:  CSW met with the patient and her supports at bedside to discuss discharge planning. The patient verbalized consent for the SNF referral. The patient was able to verbalize in her own terms that she would not be receiving peritoneal dialysis currently and will have a perm cath procedure to initiate hemodialysis. According to the attending nephrologist, the patient may transition to peritoneal dialysis once she is stronger and has more home support.   The patient's discharge date is unknown at this time, and her dialysis schedule is to be determined. CSW will continue to follow for discharge facilitation and to provide bed offers.  Employment status:  Retired Nurse, adult PT Recommendations:  [place] / Referral to community resources:  [place]  Patient/Family's  Response to care:  The patient thanked the CSW.  Patient/Family's Understanding of and Emotional Response to Diagnosis, Current Treatment, and Prognosis:  The patient understands and agrees with the discharge plan including the change from peritoneal to hemo dialysis.  Emotional Assessment Appearance:  Appears stated age Attitude/Demeanor/Rapport:  Lethargic Affect (typically observed):  Accepting, Appropriate, Pleasant Orientation:  Oriented to Self, Oriented to Place, Oriented to  Time, Oriented to Situation Alcohol / Substance use:  Never Used Psych involvement (Current and /or in the community):  No (Comment)  Discharge Needs  Concerns to be addressed:  Care Coordination, Discharge Planning Concerns Readmission within the last 30 days:  Yes Current discharge risk:  Chronically ill Barriers to Discharge:  Continued Medical Work up   [employer], LCSW 08/18/2017, 5:20 PM

## 2017-08-18 NOTE — Plan of Care (Signed)
Pt has been receiving peritoneal dialysis today. Tolerating well so far

## 2017-08-18 NOTE — Progress Notes (Signed)
[facility]  ROUNDING NOTE   Subjective:  Patient well-known to us from last admission. Home health nurse went to visit the patient yesterday and found her to be unable to perform activities of daily living. It appears that she is significantly weaker than before. Previously she stated that her care partner at home would be assisting her. However after she arrived home he stated that he could not help her with her care. We did talk about going ahead and starting PD as an urgent start however.  Objective:  Vital signs in last 24 hours:  Temp:  [98.1 F (36.7 C)-98.5 F (36.9 C)] 98.5 F (36.9 C) (11/24 0426) Pulse Rate:  [95-102] 95 (11/24 0426) Resp:  [16-20] 16 (11/24 0426) BP: (113-154)/(52-70) 113/67 (11/24 0426) SpO2:  [95 %-99 %] 99 % (11/24 0426) Weight:  [65.2 kg (143 lb 12.8 oz)] 65.2 kg (143 lb 12.8 oz) (11/23 1935)  Weight change:  Filed Weights   08/17/17 1935  Weight: 65.2 kg (143 lb 12.8 oz)    Intake/Output: No intake/output data recorded.   Intake/Output this shift:  Total I/O In: 120 [P.O.:120] Out: -   Physical Exam: General: No acute distress  Head: Normocephalic, atraumatic. Moist oral mucosal membranes  Eyes: Anicteric  Neck: Supple, trachea midline  Lungs:  Clear to auscultation, normal effort  Heart: S1S2 no rubs  Abdomen:  Soft, nontender, bowel sounds present, PD catheter in place  Extremities: trace peripheral edema.  Neurologic: Awake, alert, following commands  Skin: No lesions  Access: PD catheter in place    Basic Metabolic Panel: Recent Labs  Lab 08/13/17 2353 08/15/17 0430 08/17/17 2021 08/18/17 0421  NA 142 143 141 140  K 5.2* 4.7 4.7 4.5  CL 111 113* 107 110  CO2 21* 21* 20* 21*  GLUCOSE 104* 107* 84 92  BUN 58* 57* 73* 70*  CREATININE 4.82* 4.80* 5.29* 5.30*  CALCIUM 8.6* 8.4* 8.3* 7.9*  MG 2.3  --   --   --     Liver Function Tests: No results for input(s): AST, ALT, ALKPHOS, BILITOT, PROT, ALBUMIN  in the last 168 hours. No results for input(s): LIPASE, AMYLASE in the last 168 hours. No results for input(s): AMMONIA in the last 168 hours.  CBC: Recent Labs  Lab 08/13/17 2353 08/17/17 2021 08/18/17 0421  WBC 4.7 7.3 6.4  NEUTROABS  --  5.5  --   HGB 10.2* 8.5* 7.8*  HCT 31.7* 26.0* 23.3*  MCV 88.8 88.6 87.9  PLT 187 187 180    Cardiac Enzymes: Recent Labs  Lab 08/13/17 2353  TROPONINI <0.03    BNP: Invalid input(s): POCBNP  CBG: No results for input(s): GLUCAP in the last 168 hours.  Microbiology: No results found for this or any previous visit.  Coagulation Studies: No results for input(s): LABPROT, INR in the last 72 hours.  Urinalysis: No results for input(s): COLORURINE, LABSPEC, PHURINE, GLUCOSEU, HGBUR, BILIRUBINUR, KETONESUR, PROTEINUR, UROBILINOGEN, NITRITE, LEUKOCYTESUR in the last 72 hours.  Invalid input(s): APPERANCEUR    Imaging: Dg Chest 1 View  Result Date: 08/17/2017 CLINICAL DATA:  Weakness. EXAM: CHEST 1 VIEW COMPARISON:  Radiograph of August 14, 2017. FINDINGS: Stable cardiomediastinal silhouette. Stable large left pleural effusion is noted with probable underlying atelectasis or edema. Mild right pleural effusion is noted with associated atelectasis or edema. No pneumothorax is noted. Narrowing of the subacromial space is noted bilaterally. IMPRESSION: Bilateral pleural effusions are noted with associated atelectasis or edema, left greater than right.  Electronically Signed   By: Lupita RaiderJames  Green Jr, M.D.   On: 08/17/2017 20:09     Medications:    . amLODipine  5 mg Oral Daily  . aspirin EC  81 mg Oral Daily  . diclofenac sodium  2 g Topical QID  . furosemide  80 mg Oral Daily  . gentamicin cream  1 application Topical Daily  . heparin  5,000 Units Subcutaneous Q8H  . levothyroxine  25 mcg Oral QAC breakfast   acetaminophen **OR** acetaminophen, brinzolamide **AND** brimonidine, heparin  Assessment/ Plan:  76 y.o. female with a  PMHx of chronic kidney disease stage V, congestive heart failure, hypertension, anemia chronic kidney disease, secondary hyperparathyroidism, readmitted now for failure to thrive, worsening weakness.   1.  ESRD.  At the last visit the patient had initially declined PermCath placement.  She did agree to peritoneal dialysis catheter placement as well as to start peritoneal dialysis urgently as an outpatient on Monday.  She also previously stated that her roommate was willing to help with the peritoneal dialysis.  It appears that this is not the case now.  Given the fact that we do have a peritoneal dialysis catheter that some place we will go ahead and initiate urgent peritoneal dialysis now using low flow volumes and 1.5% dextrose solution.  However we will need to transition the patient to hemodialysis at least temporarily until her social situation is clarified a bit further.  She may regain some strength with initiation of dialysis.  2.  Anemia of chronic kidney disease.  Hemoglobin low at 7.8.  Continue to monitor.  Consider blood transfusion for hemoglobin of 7 or less.  3.  Secondary hyperparathyroidism.  Check PTH and phosphorus.     LOS: 1 Aarion Kittrell 11/24/201810:28 AM

## 2017-08-18 NOTE — Progress Notes (Addendum)
Physical Therapy Evaluation Patient Details Name: Ann CoolerWilma Cunningham MRN: [id number] DOB: Feb 10, 1941 Today's Date: 08/18/2017   History of Present Illness  Patient is a 76 y.o. female [facility] due to limited functional mobility and pain in abdomen. PMH includes CKD with PD catheter placed at last hospitalization, ESRD, essential HTN, systolic CHF with cardiomyopathy.   Clinical Impression  Patient is a pleasant female admitted for above listed reasons. Per patient, she has not independently walked for 14 years and has been undergoing gait training with PT. On evaluation, patient demonstrates global muscular weakness, 4-/5; she required moderate assistance for bed mobility and STS transfer, primarily limited by fear. Because patient lives with roommate who does not help her and is significantly limited in mobility, it is believed that she will continue to benefit from progressive PT with f/u at SNF upon d/c when medically ready.    Follow Up Recommendations SNF    Equipment Recommendations  None recommended by PT    Recommendations for Other Services       Precautions / Restrictions Precautions Precautions: Fall Restrictions Weight Bearing Restrictions: No      Mobility  Bed Mobility Overal bed mobility: Needs Assistance Bed Mobility: Supine to Sit;Sit to Supine;Rolling Rolling: Modified independent (Device/Increase time)   Supine to sit: Mod assist Sit to supine: Mod assist   General bed mobility comments: Patient able to roll with use of bed rails. Performs supine to sit and sit to supine transfer with moderate assistance for trunkl/LEs, respectively.  Transfers Overall transfer level: Needs assistance Equipment used: Rolling walker (2 wheeled) Transfers: Sit to/from Stand Sit to Stand: Mod assist         General transfer comment: Patient required set up and verbal cues due to increased fear with transfer. Able to stand with  moderate assistance statically with no LOB.  Ambulation/Gait                Stairs            Wheelchair Mobility    Modified Rankin (Stroke Patients Only)       Balance Overall balance assessment: History of Falls;Needs assistance Sitting-balance support: Feet supported Sitting balance-Leahy Scale: Good     Standing balance support: Bilateral upper extremity supported Standing balance-Leahy Scale: Fair                               Pertinent Vitals/Pain Pain Assessment: Faces Faces Pain Scale: Hurts a little bit Pain Location: Abdomen Pain Descriptors / Indicators: Aching Pain Intervention(s): Limited activity within patient's tolerance;Monitored during session    Home Living Family/patient expects to be discharged to:: Private residence Living Arrangements: Non-relatives/Friends Available Help at Discharge: Family;Other (Comment);Available PRN/intermittently;Friend(s)(per patient roommate does not help her in any way.) Type of Home: House Home Access: Stairs to enter Entrance Stairs-Rails: Right Entrance Stairs-Number of Steps: 6 in front with R rail, level entrance in back (requires car to be driven around back to access). Home Layout: One level Home Equipment: Walker - 2 wheels;Walker - 4 wheels;Wheelchair - manual;Tub bench      Prior Function Level of Independence: Needs assistance   Gait / Transfers Assistance Needed: Per patient, does not perform without PT  ADL's / Homemaking Assistance Needed: Performed by roommate; acquires meals on wheels        Hand Dominance        Extremity/Trunk Assessment  Upper Extremity Assessment Upper Extremity Assessment: Generalized weakness    Lower Extremity Assessment Lower Extremity Assessment: Generalized weakness       Communication   Communication: No difficulties  Cognition Arousal/Alertness: Awake/alert Behavior During Therapy: WFL for tasks assessed/performed Overall  Cognitive Status: Within Functional Limits for tasks assessed                                        General Comments      Exercises     Assessment/Plan    PT Assessment Patient needs continued PT services  PT Problem List Decreased strength;Decreased range of motion;Decreased activity tolerance;Decreased balance;Decreased mobility;Decreased cognition;Decreased knowledge of use of DME;Decreased safety awareness;Pain       PT Treatment Interventions DME instruction;Gait training;Functional mobility training;Therapeutic activities;Therapeutic exercise;Balance training;Patient/family education;Cognitive remediation    PT Goals (Current goals can be found in the Care Plan section)  Acute Rehab PT Goals Patient Stated Goal: "To get stronger" PT Goal Formulation: With patient Time For Goal Achievement: 09/01/17 Potential to Achieve Goals: Good    Frequency Min 2X/week   Barriers to discharge Inaccessible home environment;Decreased caregiver support      Co-evaluation               AM-PAC PT "6 Clicks" Daily Activity  Outcome Measure Difficulty turning over in bed (including adjusting bedclothes, sheets and blankets)?: Unable Difficulty moving from lying on back to sitting on the side of the bed? : Unable Difficulty sitting down on and standing up from a chair with arms (e.g., wheelchair, bedside commode, etc,.)?: Unable Help needed moving to and from a bed to chair (including a wheelchair)?: A Lot Help needed walking in hospital room?: A Lot Help needed climbing 3-5 steps with a railing? : Total 6 Click Score: 8    End of Session Equipment Utilized During Treatment: Gait belt Activity Tolerance: Patient tolerated treatment well Patient left: in bed;with call bell/phone within reach;with bed alarm set   PT Visit Diagnosis: Muscle weakness (generalized) (M62.81);History of falling (Z91.81);Pain;Difficulty in walking, not elsewhere classified (R26.2)     Time: [id number] PT Time Calculation (min) (ACUTE ONLY): 25 min   Charges:   PT Evaluation $PT Eval Low Complexity: 1 Low     PT G Codes:   PT G-Codes **NOT FOR INPATIENT CLASS** Functional Assessment Tool Used: AM-PAC 6 Clicks Basic Mobility;Clinical judgement Functional Limitation: Mobility: Walking and moving around Mobility: Walking and Moving Around Current Status ([id number](G8978): At least 80 percent but less than 100 percent impaired, limited or restricted Mobility: Walking and Moving Around Goal Status [phone]): At least 40 percent but less than 60 percent impaired, limited or restricted      Neita CarpJulie Ann Xion Debruyne, PT, DPT, COMT 08/18/2017, 9:58 AM

## 2017-08-18 NOTE — NC FL2 (Signed)
[place] MEDICAID FL2 LEVEL OF CARE SCREENING TOOL     IDENTIFICATION  Patient Name: Ann Cunningham Birthdate: September 09, 1941 Sex: female Admission Date (Current Location): 08/17/2017  [place]   Facility and Address:  [facility], [address], [place], [place] [address]      Provider Number: [id number]  Attending Physician Name and Address:  Ramonita LabGouru, Brionna Romanek, MD  Relative Name and Phone Number:  Fidel LevyVicky Wade (niece) [phone]    Current Level of Care: Hospital Recommended Level of Care: Skilled Nursing Facility Prior Approval Number:    Date Approved/Denied:   PASRR Number: [id number]  Discharge Plan: SNF    Current Diagnoses: Patient Active Problem List   Diagnosis Date Noted  . Failure to thrive in adult 08/17/2017  . Dyspnea 08/14/2017    Orientation RESPIRATION BLADDER Height & Weight     Self, Time, Situation, Place  Normal Continent Weight: 143 lb 12.8 oz (65.2 kg) Height:  5\' 5"  (165.1 cm)  BEHAVIORAL SYMPTOMS/MOOD NEUROLOGICAL BOWEL NUTRITION STATUS      Continent Diet(Renal with fluid restriction 1200 mL)  AMBULATORY STATUS COMMUNICATION OF NEEDS Skin   Extensive Assist Verbally Surgical wounds, Skin abrasions(Perm Cath; burn on left arm)                       Personal Care Assistance Level of Assistance  Bathing, Feeding, Dressing Bathing Assistance: Limited assistance Feeding assistance: Independent Dressing Assistance: Limited assistance     Functional Limitations Info    Sight Info: Adequate Hearing Info: Adequate Speech Info: Adequate    SPECIAL CARE FACTORS FREQUENCY  PT (By licensed PT), OT (By licensed OT)     PT Frequency: Up to 5X per day, 5 days per week OT Frequency: Up to 5X per day, 5 days per week            Contractures Contractures Info: Not present    Additional Factors Info  Code Status, Allergies, Psychotropic Code Status Info: DNR Allergies Info: No Known  Allergies Psychotropic Info: Norvasc         Current Medications (08/18/2017):  This is the current hospital active medication list Current Facility-Administered Medications  Medication Dose Route Frequency Provider Last Rate Last Dose  . acetaminophen (TYLENOL) tablet 650 mg  650 mg Oral Q6H PRN Alford HighlandWieting, Richard, MD       Or  . acetaminophen (TYLENOL) suppository 650 mg  650 mg Rectal Q6H PRN Wieting, Richard, MD      . amLODipine (NORVASC) tablet 5 mg  5 mg Oral Daily Wieting, Richard, MD      . aspirin EC tablet 81 mg  81 mg Oral Daily Alford HighlandWieting, Richard, MD   81 mg at 08/18/17 1053  . brinzolamide (AZOPT) 1 % ophthalmic suspension 1 drop  1 drop Both Eyes Daily PRN Wieting, Richard, MD       And  . brimonidine (ALPHAGAN) 0.2 % ophthalmic solution 1 drop  1 drop Both Eyes Daily PRN Wieting, Richard, MD      . diclofenac sodium (VOLTAREN) 1 % transdermal gel 2 g  2 g Topical QID Wieting, Richard, MD   2 g at 08/18/17 1053  . furosemide (LASIX) tablet 80 mg  80 mg Oral Daily Alford HighlandWieting, Richard, MD   80 mg at 08/18/17 1053  . gentamicin cream (GARAMYCIN) 0.1 % 1 application  1 application Topical Daily Lateef, Munsoor, MD   Stopped at 08/18/17 1216  . heparin 1000 unit/ml  injection 500 Units  500 Units Intraperitoneal PRN Lateef, Munsoor, MD      . heparin injection 5,000 Units  5,000 Units Subcutaneous Q8H Alford HighlandWieting, Richard, MD   5,000 Units at 08/18/17 1439  . levothyroxine (SYNTHROID, LEVOTHROID) tablet 25 mcg  25 mcg Oral QAC breakfast Alford HighlandWieting, Richard, MD   25 mcg at 08/18/17 [id number]     Discharge Medications: Please see discharge summary for a list of discharge medications.  Relevant Imaging Results:  Relevant Lab Results:   Additional Information SS# [id number]  Judi CongKaren M White, LCSW

## 2017-08-18 NOTE — Progress Notes (Signed)
[facility] [place] [facility]   PATIENT NAME: Ann CoolerWilma Bodley    MR#:  [id number]  DATE OF BIRTH:  06-21-1941  SUBJECTIVE:  CHIEF COMPLAINT: Patient is feeling very weak and tired, reports  shortness of breath with exertion  REVIEW OF SYSTEMS:  CONSTITUTIONAL: No fever, fatigue or weakness.  EYES: No blurred or double vision.  EARS, NOSE, AND THROAT: No tinnitus or ear pain.  RESPIRATORY: No cough,  reporting exertional shortness of breath, denies wheezing or hemoptysis.  CARDIOVASCULAR: No chest pain, orthopnea, edema.  GASTROINTESTINAL: No nausea, vomiting, diarrhea or abdominal pain.  GENITOURINARY: No dysuria, hematuria.  ENDOCRINE: No polyuria, nocturia,  HEMATOLOGY: No anemia, easy bruising or bleeding SKIN: No rash or lesion.  Left forearm burn MUSCULOSKELETAL: No joint pain or arthritis.   NEUROLOGIC: No tingling, numbness, weakness.  PSYCHIATRY: No anxiety or depression.   DRUG ALLERGIES:  No Known Allergies  VITALS:  Blood pressure 113/67, pulse 95, temperature 98.5 F (36.9 C), temperature source Oral, resp. rate 16, height 5\' 5"  (1.651 m), weight 65.2 kg (143 lb 12.8 oz), SpO2 99 %.  PHYSICAL EXAMINATION:  GENERAL:  76 y.o.-year-old patient lying in the bed with no acute distress.  EYES: Pupils equal, round, reactive to light and accommodation. No scleral icterus. Extraocular muscles intact.  HEENT: Head atraumatic, normocephalic. Oropharynx and nasopharynx clear.  NECK:  Supple, no jugular venous distention. No thyroid enlargement, no tenderness.  LUNGS: Diminished breath sounds bilaterally more diminished on the left than right, no wheezing, has few rales,rhonchi or crepitation. No use of accessory muscles of respiration.  CARDIOVASCULAR: S1, S2 normal. No murmurs, rubs, or gallops.  ABDOMEN: Soft, nontender, has peritoneal dialysis catheter with clean dressing nondistended. Bowel sounds present. No organomegaly or mass.  EXTREMITIES:  Left forearm burn, no pedal edema, cyanosis, or clubbing.  NEUROLOGIC: Cranial nerves II through XII are intact. Muscle strength diffusely diminished in all  extremities. Sensation intact. Gait not checked.  PSYCHIATRIC: The patient is alert and oriented x 3.  SKIN: No obvious rash, lesion, or ulcer.    LABORATORY PANEL:   CBC Recent Labs  Lab 08/18/17 0421  WBC 6.4  HGB 7.8*  HCT 23.3*  PLT 180   ------------------------------------------------------------------------------------------------------------------  Chemistries  Recent Labs  Lab 08/13/17 2353  08/18/17 0421  NA 142   < > 140  K 5.2*   < > 4.5  CL 111   < > 110  CO2 21*   < > 21*  GLUCOSE 104*   < > 92  BUN 58*   < > 70*  CREATININE 4.82*   < > 5.30*  CALCIUM 8.6*   < > 7.9*  MG 2.3  --   --    < > = values in this interval not displayed.   ------------------------------------------------------------------------------------------------------------------  Cardiac Enzymes Recent Labs  Lab 08/13/17 2353  TROPONINI <0.03   ------------------------------------------------------------------------------------------------------------------  RADIOLOGY:  Dg Chest 1 View  Result Date: 08/17/2017 CLINICAL DATA:  Weakness. EXAM: CHEST 1 VIEW COMPARISON:  Radiograph of August 14, 2017. FINDINGS: Stable cardiomediastinal silhouette. Stable large left pleural effusion is noted with probable underlying atelectasis or edema. Mild right pleural effusion is noted with associated atelectasis or edema. No pneumothorax is noted. Narrowing of the subacromial space is noted bilaterally. IMPRESSION: Bilateral pleural effusions are noted with associated atelectasis or edema, left greater than right. Electronically Signed   By: Lupita RaiderJames  Green Jr, M.D.   On: 08/17/2017 20:09    EKG:  Orders placed or performed during the hospital encounter of 08/13/17  . ED EKG  . ED EKG  . EKG 12-Lead  . EKG 12-Lead    ASSESSMENT AND  PLAN:   #Acute respiratory failure from bilateral pleural effusions Left greater than right Ultrasound-guided thoracentesis and fluid analysis ordered by interventional radiology Continue oxygen via nasal cannula  #End-stage renal disease Initiating a gentle peritoneal dialysis but suggesting to transit to hemodialysis  #History of systolic congestive heart failure with cardiomyopathy On Lasix, cannot start ACE inhibitor in view of renal insufficiency which is getting worse We will add low-dose beta-blocker if blood pressure permits  #Hypertension continue Norvasc  #Anemia of chronic kidney disease hemoglobin at 7.8 will transfuse if hemoglobin is less than 7  #Failure to thrive at home.  Unable to do activities of daily living.  Poor appetite and weight loss.  Patient would benefit from nursing home facility.     Disposition skilled nursing facility  All the records are reviewed and case discussed with Care Management/Social Workerr. Management plans discussed with the patient, niece [name] HCPOA and they are in agreement.  CODE STATUS: dnr  TOTAL TIME TAKING CARE OF THIS PATIENT: 36  minutes.   POSSIBLE D/C IN 1-2 DAYS, DEPENDING ON CLINICAL CONDITION.  Note: This dictation was prepared with Dragon dictation along with smaller phrase technology. Any transcriptional errors that result from this process are unintentional.   Ramonita LabGouru, Hamsa Laurich M.D on 08/18/2017 at 12:45 PM  Between 7am to 6pm - Pager - [phone] After 6pm go to [email or web address] - password [email or web address] [email or web address]  [facility]  Office  [phone]  CC: Primary care physician; Center, [facility]

## 2017-08-18 NOTE — Clinical Social Work Note (Addendum)
CSW received consult for placement. CSW will assess when able. PT recommendation is pending. Patient is on periotoneal dialysis which is a barrier for placement.  PT has recommended STR and the attending nephrologist has indicated short term hemodialysis. CSW will assess when able.  Ann Cunningham, MSW, Theresia MajorsLCSWA [phone]

## 2017-08-19 LAB — PHOSPHORUS: Phosphorus: 4.7 mg/dL — ABNORMAL HIGH (ref 2.5–4.6)

## 2017-08-19 MED ORDER — NEPRO/CARBSTEADY PO LIQD
237.0000 mL | Freq: Three times a day (TID) | ORAL | Status: DC
  Administered 2017-08-19 – 2017-08-21 (×3): 237 mL via ORAL

## 2017-08-19 MED ORDER — RENA-VITE PO TABS
1.0000 | ORAL_TABLET | Freq: Every day | ORAL | Status: DC
  Administered 2017-08-19 – 2017-08-21 (×3): 1 via ORAL
  Filled 2017-08-19 (×3): qty 1

## 2017-08-19 NOTE — Progress Notes (Signed)
PD Tx started

## 2017-08-19 NOTE — Plan of Care (Signed)
Pt has been receiving PD again today. Pt has tolerated ok. Pt still does not have a good appetite. Dietitian saw pt and ordered nephro shake.

## 2017-08-19 NOTE — Progress Notes (Signed)
[facility]  ROUNDING NOTE   Subjective:  Patient has done well with urgent start peritoneal dialysis. No reported leakage from catheter per dialysis nurse. She is receiving her second treatment today. However as before her partner at home has declined to help her with peritoneal dialysis. Therefore we will have to transition her to hemodialysis. We will request vascular surgery to place a PermCath tomorrow.  Objective:  Vital signs in last 24 hours:  Temp:  [97.9 F (36.6 C)-98.5 F (36.9 C)] 98.5 F (36.9 C) (11/25 0512) Pulse Rate:  [77-94] 89 (11/25 0957) Resp:  [18] 18 (11/25 0512) BP: (105-121)/(58-69) 121/69 (11/25 0957) SpO2:  [98 %-100 %] 98 % (11/25 0512) Weight:  [64.7 kg (142 lb 9.6 oz)] 64.7 kg (142 lb 9.6 oz) (11/25 0512)  Weight change: -0.544 kg (-3.2 oz) Filed Weights   08/17/17 1935 08/19/17 0512  Weight: 65.2 kg (143 lb 12.8 oz) 64.7 kg (142 lb 9.6 oz)    Intake/Output: I/O last 3 completed shifts: In: 3920 [P.O.:120; Other:3800] Out: 0    Intake/Output this shift:  No intake/output data recorded.  Physical Exam: General: No acute distress  Head: Normocephalic, atraumatic. Moist oral mucosal membranes  Eyes: Anicteric  Neck: Supple, trachea midline  Lungs:  Clear to auscultation, normal effort  Heart: S1S2 no rubs  Abdomen:  Soft, nontender, bowel sounds present, PD catheter in place  Extremities: trace peripheral edema.  Neurologic: Awake, alert, following commands  Skin: No lesions  Access: PD catheter in place    Basic Metabolic Panel: Recent Labs  Lab 08/13/17 2353 08/15/17 0430 08/17/17 2021 08/18/17 0421  NA 142 143 141 140  K 5.2* 4.7 4.7 4.5  CL 111 113* 107 110  CO2 21* 21* 20* 21*  GLUCOSE 104* 107* 84 92  BUN 58* 57* 73* 70*  CREATININE 4.82* 4.80* 5.29* 5.30*  CALCIUM 8.6* 8.4* 8.3* 7.9*  MG 2.3  --   --   --   PHOS  --   --   --  4.7*    Liver Function Tests: No results for input(s): AST, ALT,  ALKPHOS, BILITOT, PROT, ALBUMIN in the last 168 hours. No results for input(s): LIPASE, AMYLASE in the last 168 hours. No results for input(s): AMMONIA in the last 168 hours.  CBC: Recent Labs  Lab 08/13/17 2353 08/17/17 2021 08/18/17 0421  WBC 4.7 7.3 6.4  NEUTROABS  --  5.5  --   HGB 10.2* 8.5* 7.8*  HCT 31.7* 26.0* 23.3*  MCV 88.8 88.6 87.9  PLT 187 187 180    Cardiac Enzymes: Recent Labs  Lab 08/13/17 2353  TROPONINI <0.03    BNP: Invalid input(s): POCBNP  CBG: No results for input(s): GLUCAP in the last 168 hours.  Microbiology: No results found for this or any previous visit.  Coagulation Studies: No results for input(s): LABPROT, INR in the last 72 hours.  Urinalysis: No results for input(s): COLORURINE, LABSPEC, PHURINE, GLUCOSEU, HGBUR, BILIRUBINUR, KETONESUR, PROTEINUR, UROBILINOGEN, NITRITE, LEUKOCYTESUR in the last 72 hours.  Invalid input(s): APPERANCEUR    Imaging: Dg Chest 1 View  Result Date: 08/17/2017 CLINICAL DATA:  Weakness. EXAM: CHEST 1 VIEW COMPARISON:  Radiograph of August 14, 2017. FINDINGS: Stable cardiomediastinal silhouette. Stable large left pleural effusion is noted with probable underlying atelectasis or edema. Mild right pleural effusion is noted with associated atelectasis or edema. No pneumothorax is noted. Narrowing of the subacromial space is noted bilaterally. IMPRESSION: Bilateral pleural effusions are noted with associated atelectasis or  edema, left greater than right. Electronically Signed   By: Lupita RaiderJames  Green Jr, M.D.   On: 08/17/2017 20:09     Medications:    . amLODipine  5 mg Oral Daily  . aspirin EC  81 mg Oral Daily  . diclofenac sodium  2 g Topical QID  . furosemide  80 mg Oral Daily  . gentamicin cream  1 application Topical Daily  . heparin  5,000 Units Subcutaneous Q8H  . levothyroxine  25 mcg Oral QAC breakfast   acetaminophen **OR** acetaminophen, brinzolamide **AND** brimonidine, heparin  Assessment/  Plan:  76 y.o. female with a PMHx of chronic kidney disease stage V, congestive heart failure, hypertension, anemia chronic kidney disease, secondary hyperparathyroidism, readmitted now for failure to thrive, worsening weakness.   1.  ESRD.  At the last visit the patient had initially declined PermCath placement.  She did agree to peritoneal dialysis catheter placement as well as to start peritoneal dialysis urgently as an outpatient on Monday.  She also previously stated that her roommate was willing to help with the peritoneal dialysis.  It appears that this is not the case now.   -The patient has done well with urgent start PD thus far.  We will continue peritoneal dialysis today.  However as before her partner at home is no longer willing to help her with the training of peritoneal dialysis.  Therefore we will need to switch the patient to intermittent hemodialysis.  We will consult with vascular surgery for PermCath placement tomorrow.  [facility] [address] [facility].   2.  Anemia of chronic kidney disease.  Consider starting Epogen with hemodialysis.  3.  Secondary hyperparathyroidism.  Phosphorus 4.7.  No need to start binders at the moment.    LOS: 2 Aviona Martenson 11/25/201811:29 AM

## 2017-08-19 NOTE — Progress Notes (Signed)
Initial Nutrition Assessment  DOCUMENTATION CODES:   Severe malnutrition in context of chronic illness  INTERVENTION:  Provide Nepro Shake po TID, each supplement provides 425 kcal and 19 grams protein.  Recommend Rena-vite QHS.  Consider appetite stimulant if medically appropriate.  Encouraged adequate intake of calories and protein at meals. Discussed patient's increased needs for calories and protein.  NUTRITION DIAGNOSIS:   Severe Malnutrition related to chronic illness(CHF, CKD now ESRD) as evidenced by severe fat depletion, severe muscle depletion.  GOAL:   Patient will meet greater than or equal to 90% of their needs  MONITOR:   PO intake, Supplement acceptance, Labs, Weight trends, I & O's  REASON FOR ASSESSMENT:   Malnutrition Screening Tool, Consult Assessment of nutrition requirement/status  ASSESSMENT:   76 year old female with PMHx of HTN, hypothyroidism, CHF, CKD admitted with FTT at home, chronic kidney disease progressed to ESRD s/p placement of PD catheter on 08/15/2017. Patient required urgent start PD, but as partner at home will not be able to help with PD she will have to transition to HD, so plan is for PermCath placement this admission.   Met with patient at bedside. She reports she has had a poor appetite for a while now. She is not sure exactly when her appetite decreased. Patient reports at home she receives Meals on Wheels 5 days a week. She reports that in addition to that meal she may have a sandwich or some snacks. She is unable to report any further details on intake. She reports she is having trouble getting her food set up here. She ate some food yesterday (25-30% of meals per chart) but has not had anything to eat today. She reports she does not know why she is not eating today, just hasn't yet. Untouched tray at bedside next to patient. She is amenable to drinking Nepro and reports she would like either flavor.  Per review of weight history in  Care Everywhere patient was 173.6 lbs on 01/03/2016 and 134.1 lbs on 08/15/2017. She has lost 39.5 lbs (22.8% body weight).  Medications reviewed and include: Lasix 80 mg daily, levothyroxine.  Labs reviewed: CO2 21, BUN 70, Creatinine 5.3.  Discussed with RN.  NUTRITION - FOCUSED PHYSICAL EXAM:    Most Recent Value  Orbital Region  Severe depletion  Upper Arm Region  Severe depletion  Thoracic and Lumbar Region  Severe depletion  Buccal Region  Severe depletion  Temple Region  Severe depletion  Clavicle Bone Region  Severe depletion  Clavicle and Acromion Bone Region  Severe depletion  Scapular Bone Region  Severe depletion  Dorsal Hand  Severe depletion  Patellar Region  Moderate depletion  Anterior Thigh Region  Moderate depletion  Posterior Calf Region  Moderate depletion  Edema (RD Assessment)  Mild  Hair  Reviewed  Eyes  Reviewed  Mouth  Reviewed  Skin  Reviewed  Nails  Reviewed     Diet Order:  Diet renal with fluid restriction Fluid restriction: 1200 mL Fluid; Room service appropriate? Yes; Fluid consistency: Thin  EDUCATION NEEDS:   Not appropriate for education at this time  Skin:  Skin Assessment: Skin Integrity Issues: Skin Integrity Issues:: Other (Comment) Other: healing burn to left arm  Last BM:  08/17/2017  Height:   Ht Readings from Last 1 Encounters:  08/17/17 5' 5"  (1.651 m)    Weight:   Wt Readings from Last 1 Encounters:  08/19/17 142 lb 9.6 oz (64.7 kg)    Ideal Body Weight:  56.8 kg  BMI:  Body mass index is 23.73 kg/m.  Estimated Nutritional Needs:   Kcal:  1520-1825 (25-30 kcal/kg)  Protein:  80-90 grams (1.3-1.5 grams/kg)  Fluid:  UOP + 1 L  Willey Blade, MS, RD, LDN Office: [phone] Pager: [phone] After Hours/Weekend Pager: [phone]

## 2017-08-19 NOTE — Progress Notes (Signed)
Late entry.  Pt connected to PD cycle yesterday 08/18/17 @ 11:00 am.  Pt taken off cycler approx 1915. Pt tolerated tx, denies any discomforts during treatment. Effluent blood tinge, due to new access placement. No leakage around access site MD notified  -[id number].

## 2017-08-19 NOTE — Progress Notes (Signed)
[facility] [place] [facility]   PATIENT NAME: Ann CoolerWilma Cunningham    MR#:  [id number]  DATE OF BIRTH:  04/21/1941  SUBJECTIVE:  CHIEF COMPLAINT: Patient is feeling very weak and tired, reports  Improved shortness of breath with exertion  REVIEW OF SYSTEMS:  CONSTITUTIONAL: No fever, fatigue or weakness.  EYES: No blurred or double vision.  EARS, NOSE, AND THROAT: No tinnitus or ear pain.  RESPIRATORY: No cough,  reporting exertional shortness of breath, denies wheezing or hemoptysis.  CARDIOVASCULAR: No chest pain, orthopnea, edema.  GASTROINTESTINAL: No nausea, vomiting, diarrhea or abdominal pain.  GENITOURINARY: No dysuria, hematuria.  ENDOCRINE: No polyuria, nocturia,  HEMATOLOGY: No anemia, easy bruising or bleeding SKIN: No rash or lesion.  Left forearm burn MUSCULOSKELETAL: No joint pain or arthritis.   NEUROLOGIC: No tingling, numbness, weakness.  PSYCHIATRY: No anxiety or depression.   DRUG ALLERGIES:  No Known Allergies  VITALS:  Blood pressure 124/66, pulse 81, temperature 97.7 F (36.5 C), temperature source Oral, resp. rate 18, height 5\' 5"  (1.651 m), weight 64.7 kg (142 lb 9.6 oz), SpO2 98 %.  PHYSICAL EXAMINATION:  GENERAL:  76 y.o.-year-old patient lying in the bed with no acute distress.  EYES: Pupils equal, round, reactive to light and accommodation. No scleral icterus. Extraocular muscles intact.  HEENT: Head atraumatic, normocephalic. Oropharynx and nasopharynx clear.  NECK:  Supple, no jugular venous distention. No thyroid enlargement, no tenderness.  LUNGS: Diminished breath sounds bilaterally more diminished on the left than right, no wheezing, has few rales,rhonchi or crepitation. No use of accessory muscles of respiration.  CARDIOVASCULAR: S1, S2 normal. No murmurs, rubs, or gallops.  ABDOMEN: Soft, nontender, has peritoneal dialysis catheter with clean dressing nondistended. Bowel sounds present. No organomegaly or mass.   EXTREMITIES: Left forearm burn, no pedal edema, cyanosis, or clubbing.  NEUROLOGIC: Cranial nerves II through XII are intact. Muscle strength diffusely diminished in all  extremities. Sensation intact. Gait not checked.  PSYCHIATRIC: The patient is alert and oriented x 3.  SKIN: No obvious rash, lesion, or ulcer.    LABORATORY PANEL:   CBC Recent Labs  Lab 08/18/17 0421  WBC 6.4  HGB 7.8*  HCT 23.3*  PLT 180   ------------------------------------------------------------------------------------------------------------------  Chemistries  Recent Labs  Lab 08/13/17 2353  08/18/17 0421  NA 142   < > 140  K 5.2*   < > 4.5  CL 111   < > 110  CO2 21*   < > 21*  GLUCOSE 104*   < > 92  BUN 58*   < > 70*  CREATININE 4.82*   < > 5.30*  CALCIUM 8.6*   < > 7.9*  MG 2.3  --   --    < > = values in this interval not displayed.   ------------------------------------------------------------------------------------------------------------------  Cardiac Enzymes Recent Labs  Lab 08/13/17 2353  TROPONINI <0.03   ------------------------------------------------------------------------------------------------------------------  RADIOLOGY:  Dg Chest 1 View  Result Date: 08/17/2017 CLINICAL DATA:  Weakness. EXAM: CHEST 1 VIEW COMPARISON:  Radiograph of August 14, 2017. FINDINGS: Stable cardiomediastinal silhouette. Stable large left pleural effusion is noted with probable underlying atelectasis or edema. Mild right pleural effusion is noted with associated atelectasis or edema. No pneumothorax is noted. Narrowing of the subacromial space is noted bilaterally. IMPRESSION: Bilateral pleural effusions are noted with associated atelectasis or edema, left greater than right. Electronically Signed   By: Lupita RaiderJames  Green Jr, M.D.   On: 08/17/2017 20:09    EKG:  Orders placed or performed during the hospital encounter of 08/13/17  . ED EKG  . ED EKG  . EKG 12-Lead  . EKG 12-Lead     ASSESSMENT AND PLAN:   #Acute respiratory failure from bilateral pleural effusions Left greater than right Ultrasound-guided thoracentesis and fluid analysis ordered by interventional radiology, scheduled for tomorrow, discussed with interventional radiologist Dr. Shella SpearingWaggener Continue oxygen via nasal cannula  #End-stage renal disease On peritoneal dialysis but her nephrologist is recommending to transfer to hemodialysis; Scheduled to permacath tomorrow by vascular surgery   #History of systolic congestive heart failure with cardiomyopathy On Lasix, cannot start ACE inhibitor in view of renal insufficiency which is getting worse We will add low-dose beta-blocker if blood pressure permits  #Hypertension continue Norvasc  #Anemia of chronic kidney disease hemoglobin at 7.8 will transfuse if hemoglobin is less than 7  #Failure to thrive at home.  Unable to do activities of daily living.  Poor appetite and weight loss.  Patient would benefit from nursing home facility.     Disposition skilled nursing facility  All the records are reviewed and case discussed with Care Management/Social Workerr. Management plans discussed with the patient, niece Ann CashingVicky Cunningham and they are in agreement.  CODE STATUS: dnr  TOTAL TIME TAKING CARE OF THIS PATIENT: 36  minutes.   POSSIBLE D/C IN 1-2 DAYS, DEPENDING ON CLINICAL CONDITION.  Note: This dictation was prepared with Dragon dictation along with smaller phrase technology. Any transcriptional errors that result from this process are unintentional.   Ramonita LabGouru, Shirl Weir M.D on 08/19/2017 at 5:12 PM  Between 7am to 6pm - Pager - [phone] After 6pm go to [email or web address] - password [email or web address] [facility]  [facility]  Office  [phone]  CC: Primary care physician; Center, [facility]

## 2017-08-19 NOTE — Progress Notes (Signed)
PD started 

## 2017-08-19 NOTE — Progress Notes (Signed)
Pt disconnected from cycler

## 2017-08-19 NOTE — Consult Note (Signed)
WOC Nurse wound consult note Reason for Consult:Patient confirms that thermal injury occurred in October of 2017-over 1 year ago.  Occurred while cooking. Patient keeps it wrapped to prevent infection, but there is scant exudate. Wound type:Thermal Pressure Injury POA: N/A Measurement:Per Bedisde RN, Shanda BumpsJessica:  17cm x 3cm at the most narrow area and 7cm at the widest point.  Depth measures 0.1cm. Wound bed: red, moist Drainage (amount, consistency, odor) scant serous Periwound: intact, dry Dressing procedure/placement/frequency: Topical silver sulfadiazine is not indicated for a thermal injury of this duration, but an antimicrobial dressing that can be performed once daily is appropriate. I will recommend once daily xeroform folded gauze dressing to cover wound topped with dry gauze dressings (4x4s) as a topped, secured with Kerlix roll gauze and paper tape. RN to perform once daily while in house. Patient may remove to bathe or shower and redress following once at home or in a SNF.  WOC nursing team will not follow, but will remain available to this patient, the nursing and medical teams.  Please re-consult if needed. Thanks, Ladona MowLaurie Eulises Kijowski, MSN, RN, GNP, Hans EdenCWOCN, CWON-AP, FAAN  Pager# [phone]

## 2017-08-19 NOTE — Progress Notes (Signed)
PD treatment, pt taken off cycler, no leakage noted. Pt denies SOB or any other discomforts.

## 2017-08-20 ENCOUNTER — Encounter: Payer: Self-pay | Admitting: Vascular Surgery

## 2017-08-20 ENCOUNTER — Inpatient Hospital Stay: Payer: Medicare Other

## 2017-08-20 ENCOUNTER — Encounter
Admission: EM | Disposition: A | Discharge status: Skilled Nursing Facility | Payer: Self-pay | Source: Home / Self Care | Attending: Internal Medicine

## 2017-08-20 DIAGNOSIS — N186 End stage renal disease: Secondary | ICD-10-CM

## 2017-08-20 HISTORY — PX: DIALYSIS/PERMA CATHETER INSERTION: CATH118288

## 2017-08-20 LAB — BASIC METABOLIC PANEL
Anion gap: 10 (ref 5–15)
BUN: 57 mg/dL — ABNORMAL HIGH (ref 6–20)
CALCIUM: 7.5 mg/dL — AB (ref 8.9–10.3)
CHLORIDE: 105 mmol/L (ref 101–111)
CO2: 23 mmol/L (ref 22–32)
CREATININE: 4.47 mg/dL — AB (ref 0.44–1.00)
GFR calc Af Amer: 10 mL/min — ABNORMAL LOW (ref >60)
GFR calc non Af Amer: 9 mL/min — ABNORMAL LOW (ref >60)
GLUCOSE: 88 mg/dL (ref 65–99)
Potassium: 3.6 mmol/L (ref 3.5–5.1)
Sodium: 138 mmol/L (ref 135–145)

## 2017-08-20 LAB — BODY FLUID CELL COUNT WITH DIFFERENTIAL
Lymphs, Fluid: 76 %
MONOCYTE-MACROPHAGE-SEROUS FLUID: 17 %
NEUTROPHIL FLUID: 7 %
WBC FLUID: 66 uL

## 2017-08-20 LAB — CBC
HEMATOCRIT: 26.4 % — AB (ref 35.0–47.0)
HEMOGLOBIN: 8.6 g/dL — AB (ref 12.0–16.0)
MCH: 28.8 pg (ref 26.0–34.0)
MCHC: 32.7 g/dL (ref 32.0–36.0)
MCV: 88.1 fL (ref 80.0–100.0)
Platelets: 218 10*3/uL (ref 150–440)
RBC: 3 MIL/uL — ABNORMAL LOW (ref 3.80–5.20)
RDW: 14.8 % — ABNORMAL HIGH (ref 11.5–14.5)
WBC: 5.1 10*3/uL (ref 3.6–11.0)

## 2017-08-20 LAB — PARATHYROID HORMONE, INTACT (NO CA): PTH: 185 pg/mL — AB (ref 15–65)

## 2017-08-20 LAB — ALBUMIN, PLEURAL OR PERITONEAL FLUID: ALBUMIN FL: 1.9 g/dL

## 2017-08-20 LAB — GLUCOSE, PLEURAL OR PERITONEAL FLUID: GLUCOSE FL: 94 mg/dL

## 2017-08-20 LAB — LACTATE DEHYDROGENASE, PLEURAL OR PERITONEAL FLUID: LD, Fluid: 142 U/L — ABNORMAL HIGH (ref 3–23)

## 2017-08-20 LAB — AMYLASE, PLEURAL OR PERITONEAL FLUID: Amylase, Fluid: 33 U/L

## 2017-08-20 LAB — PROTEIN, PLEURAL OR PERITONEAL FLUID: Total protein, fluid: 3.4 g/dL

## 2017-08-20 SURGERY — DIALYSIS/PERMA CATHETER INSERTION
Anesthesia: Moderate Sedation

## 2017-08-20 MED ORDER — FENTANYL CITRATE (PF) 100 MCG/2ML IJ SOLN
INTRAMUSCULAR | Status: AC
  Filled 2017-08-20: qty 2

## 2017-08-20 MED ORDER — FLUMAZENIL 0.5 MG/5ML IV SOLN
INTRAVENOUS | Status: AC
  Filled 2017-08-20: qty 5

## 2017-08-20 MED ORDER — MIDAZOLAM HCL 5 MG/5ML IJ SOLN
INTRAMUSCULAR | Status: AC
  Filled 2017-08-20: qty 5

## 2017-08-20 MED ORDER — MIDAZOLAM HCL 2 MG/2ML IJ SOLN
INTRAMUSCULAR | Status: DC | PRN
  Administered 2017-08-20: 2 mg via INTRAVENOUS
  Administered 2017-08-20: 1 mg via INTRAVENOUS
  Administered 2017-08-20: 2 mg via INTRAVENOUS

## 2017-08-20 MED ORDER — HEPARIN SODIUM (PORCINE) 10000 UNIT/ML IJ SOLN
INTRAMUSCULAR | Status: AC
  Filled 2017-08-20: qty 1

## 2017-08-20 MED ORDER — SODIUM CHLORIDE 0.9 % IV SOLN
INTRAVENOUS | Status: DC
  Administered 2017-08-20: 12:00:00 via INTRAVENOUS

## 2017-08-20 MED ORDER — FENTANYL CITRATE (PF) 100 MCG/2ML IJ SOLN
INTRAMUSCULAR | Status: DC | PRN
  Administered 2017-08-20: 50 ug via INTRAVENOUS
  Administered 2017-08-20: 25 ug via INTRAVENOUS
  Administered 2017-08-20: 50 ug via INTRAVENOUS

## 2017-08-20 MED ORDER — DEXTROSE 5 % IV SOLN
1.5000 g | INTRAVENOUS | Status: DC
  Filled 2017-08-20: qty 1.5

## 2017-08-20 MED ORDER — SODIUM CHLORIDE 0.9 % IV BOLUS (SEPSIS)
250.0000 mL | Freq: Once | INTRAVENOUS | Status: AC
  Administered 2017-08-20: 250 mL via INTRAVENOUS

## 2017-08-20 MED ORDER — NALOXONE HCL 2 MG/2ML IJ SOSY
PREFILLED_SYRINGE | INTRAMUSCULAR | Status: AC
  Filled 2017-08-20: qty 2

## 2017-08-20 MED ORDER — EPOETIN ALFA 10000 UNIT/ML IJ SOLN
10000.0000 [IU] | INTRAMUSCULAR | Status: AC
  Administered 2017-08-21: 10000 [IU] via INTRAVENOUS

## 2017-08-20 SURGICAL SUPPLY — 6 items
CATH PALINDROME RT-P 15FX19CM (CATHETERS) ×3 IMPLANT
DERMABOND ADVANCED (GAUZE/BANDAGES/DRESSINGS) ×2
DERMABOND ADVANCED .7 DNX12 (GAUZE/BANDAGES/DRESSINGS) ×1 IMPLANT
PACK ANGIOGRAPHY (CUSTOM PROCEDURE TRAY) ×3 IMPLANT
SUT MNCRL AB 4-0 PS2 18 (SUTURE) ×3 IMPLANT
SUT PROLENE 0 CT 1 30 (SUTURE) ×3 IMPLANT

## 2017-08-20 NOTE — Progress Notes (Signed)
[facility] [place] [facility]   PATIENT NAME: Ann CoolerWilma Volkov    MR#:  [id number]  DATE OF BIRTH:  Mar 22, 1941  SUBJECTIVE:  CHIEF COMPLAINT: Patient is feeling very weak and tired, reports  Improved shortness of breath with exertion.  Agreeable for thoracentesis today  REVIEW OF SYSTEMS:  CONSTITUTIONAL: No fever, fatigue or weakness.  EYES: No blurred or double vision.  EARS, NOSE, AND THROAT: No tinnitus or ear pain.  RESPIRATORY: No cough,  reporting exertional shortness of breath, denies wheezing or hemoptysis.  CARDIOVASCULAR: No chest pain, orthopnea, edema.  GASTROINTESTINAL: No nausea, vomiting, diarrhea or abdominal pain.  GENITOURINARY: No dysuria, hematuria.  ENDOCRINE: No polyuria, nocturia,  HEMATOLOGY: No anemia, easy bruising or bleeding SKIN: No rash or lesion.  Left forearm burn MUSCULOSKELETAL: No joint pain or arthritis.   NEUROLOGIC: No tingling, numbness, weakness.  PSYCHIATRY: No anxiety or depression.   DRUG ALLERGIES:  No Known Allergies  VITALS:  Blood pressure (!) 90/56, pulse 73, temperature 97.6 F (36.4 C), temperature source Oral, resp. rate 12, height 5\' 5"  (1.651 m), weight 60.3 kg (132 lb 15 oz), SpO2 99 %.  PHYSICAL EXAMINATION:  GENERAL:  76 y.o.-year-old patient lying in the bed with no acute distress.  EYES: Pupils equal, round, reactive to light and accommodation. No scleral icterus. Extraocular muscles intact.  HEENT: Head atraumatic, normocephalic. Oropharynx and nasopharynx clear.  NECK:  Supple, no jugular venous distention. No thyroid enlargement, no tenderness.  LUNGS: Diminished breath sounds bilaterally more diminished on the left than right, no wheezing, has few rales,rhonchi or crepitation. No use of accessory muscles of respiration.  CARDIOVASCULAR: S1, S2 normal. No murmurs, rubs, or gallops.  ABDOMEN: Soft, nontender, has peritoneal dialysis catheter with clean dressing nondistended. Bowel sounds  present. No organomegaly or mass.  EXTREMITIES: Left forearm burn, no pedal edema, cyanosis, or clubbing.  NEUROLOGIC: Cranial nerves II through XII are intact. Muscle strength diffusely diminished in all  extremities. Sensation intact. Gait not checked.  PSYCHIATRIC: The patient is alert and oriented x 3.  SKIN: No obvious rash, lesion, or ulcer.    LABORATORY PANEL:   CBC Recent Labs  Lab 08/20/17 0429  WBC 5.1  HGB 8.6*  HCT 26.4*  PLT 218   ------------------------------------------------------------------------------------------------------------------  Chemistries  Recent Labs  Lab 08/13/17 2353  08/20/17 0429  NA 142   < > 138  K 5.2*   < > 3.6  CL 111   < > 105  CO2 21*   < > 23  GLUCOSE 104*   < > 88  BUN 58*   < > 57*  CREATININE 4.82*   < > 4.47*  CALCIUM 8.6*   < > 7.5*  MG 2.3  --   --    < > = values in this interval not displayed.   ------------------------------------------------------------------------------------------------------------------  Cardiac Enzymes Recent Labs  Lab 08/13/17 2353  TROPONINI <0.03   ------------------------------------------------------------------------------------------------------------------  RADIOLOGY:  Dg Chest 1 View  Result Date: 08/20/2017 CLINICAL DATA:  Post thoracentesis. EXAM: CHEST 1 VIEW COMPARISON:  08/17/2017. FINDINGS: Trachea is midline. Heart size is grossly stable. There is a partially loculated left pneumothorax, small to moderate in size. No residual left pleural fluid. Airspace consolidation in the left upper and left lower lobes. Small to moderate right pleural effusion with right basilar collapse/ consolidation. IMPRESSION: 1. Small to moderate left pneumothorax after left thoracentesis. Some or all of the pleural air may be due to failure of diseased lung  to fully re-expand. This was discussed with Dr. Irish LackGlenn Yamagata at 1140 hours. 2. Bibasilar airspace consolidation may be due to pneumonia. 3.  Small to moderate right pleural effusion. Electronically Signed   By: Leanna BattlesMelinda  Blietz M.D.   On: 08/20/2017 11:40   [place] Thoracentesis Asp Pleural Space W/img Guide  Result Date: 08/20/2017 CLINICAL DATA:  Recurrent left pleural effusion and respiratory failure. Status post left thoracentesis yielding 800 mL of pleural fluid on 08/14/2017. EXAM: ULTRASOUND GUIDED THORACENTESIS COMPARISON:  08/14/2017 thoracentesis and chest x-ray on 08/17/2017. PROCEDURE: An ultrasound guided thoracentesis was thoroughly discussed with the patient and questions answered. The benefits, risks, alternatives and complications were also discussed. The patient understands and wishes to proceed with the procedure. Written consent was obtained. Ultrasound was performed to localize and mark an adequate pocket of fluid in the left chest. The area was then prepped and draped in the normal sterile fashion. 1% Lidocaine was used for local anesthesia. Under ultrasound guidance a 6 French Safe-T-Centesis catheter was introduced. Thoracentesis was performed. The catheter was removed and a dressing applied. COMPLICATIONS: None FINDINGS: A total of approximately 700 mL of clear, yellow fluid was removed. A fluid sample was sent for laboratory analysis. IMPRESSION: Successful ultrasound guided left thoracentesis yielding 700 mL of pleural fluid. Electronically Signed   By: Irish LackGlenn  Yamagata M.D.   On: 08/20/2017 12:03    EKG:   Orders placed or performed during the hospital encounter of 08/13/17  . ED EKG  . ED EKG  . EKG 12-Lead  . EKG 12-Lead    ASSESSMENT AND PLAN:   #Acute respiratory failure from bilateral pleural effusions Left greater than right Ultrasound-guided thoracentesis and fluid analysis done by interventional radiology today and had 700 cc of clear fluid taken out today  Continue oxygen via nasal cannula  #End-stage renal disease patient failed peritoneal dialysis and unable to get family or friend support   Permacath placed today by vascular surgery in the right IJ For hemodialysis today Follow-up with nephrology, [facility].     #History of systolic congestive heart failure with cardiomyopathy On Lasix, cannot start ACE inhibitor in view of renal insufficiency which is getting worse We will add low-dose beta-blocker if blood pressure permits  #Hypertension continue Norvasc  #Anemia of chronic kidney disease hemoglobin at 7.8 will transfuse if hemoglobin is less than 7  #Failure to thrive at home.  Unable to do activities of daily living.  Poor appetite and weight loss.  Patient would benefit from nursing home facility.     Disposition skilled nursing facility  All the records are reviewed and case discussed with Care Management/Social Workerr. Management plans discussed with the patient, niece [name] HCPOA and they are in agreement.  CODE STATUS: dnr  TOTAL TIME TAKING CARE OF THIS PATIENT: 36  minutes.   POSSIBLE D/C IN 1 DAYS, DEPENDING ON CLINICAL CONDITION.  Note: This dictation was prepared with Dragon dictation along with smaller phrase technology. Any transcriptional errors that result from this process are unintentional.   Ramonita LabGouru, Dellamae Rosamilia M.D on 08/20/2017 at 3:45 PM  Between 7am to 6pm - Pager - [phone] After 6pm go to [email or web address] - password [email or web address] [facility]  [facility]  Office  [phone]  CC: Primary care physician; Center, [facility]

## 2017-08-20 NOTE — H&P (Signed)
[facility] History & Physical Update  The patient was interviewed and re-examined.  The patient's previous History and Physical has been reviewed and is unchanged.  She now needs a permcath for hemodialysis. There is no change in the plan of care. We plan to proceed with the scheduled procedure.  Festus BarrenJason Dew, MD  08/20/2017, 12:15 PM

## 2017-08-20 NOTE — Progress Notes (Signed)
PT Cancellation Note  Patient Details Name: Ann Cunningham MRN: [id number] DOB: 1941/02/04   Cancelled Treatment:    Reason Eval/Treat Not Completed: Patient at procedure or test/unavailable.  Nursing reports pt currently off floor for procedure (plan for thoracentesis this morning) and plan for dialysis Permcath placement today as well.  Will re-attempt PT treatment at a later date/time.  Hendricks LimesEmily Olanrewaju Osborn, PT 08/20/17, 12:04 PM [phone]

## 2017-08-20 NOTE — Progress Notes (Signed)
HD tx end  

## 2017-08-20 NOTE — Care Management (Signed)
Patient admitted with respiratory failure.  Patient was recently discharged from [facility].  Patient live at home with friend.  PCP [facility] at [facility].  Patient utilizes the pharmacy at [facility] as well as [facility].  Patient has Rw and WC in the home.  Previous admission patient was set up with out patient PD.  It is not recommended that patient transition to outpatient HD.  Dimas ChyleAmanda Morris HD liaison aware.  Plan for perm cath placement.  Patient open with [facility] home health.  Elnita Maxwellheryl with [facility] notified of admission.  PT currently recommending SNF.  RNCM following.

## 2017-08-20 NOTE — Care Management Important Message (Signed)
Important Message  Patient Details  Name: Ann CoolerWilma Cunningham MRN: [id number] Date of Birth: 13-Jun-1941   Medicare Important Message Given:  Yes    Chapman FitchBOWEN, Tayo Maute T, RN 08/20/2017, 1:49 PM

## 2017-08-20 NOTE — Progress Notes (Signed)
Pre HD assessment  

## 2017-08-20 NOTE — Progress Notes (Signed)
Post HD assessment  

## 2017-08-20 NOTE — Progress Notes (Signed)
HD tx start 

## 2017-08-20 NOTE — Progress Notes (Signed)
Patient ID: Ann CoolerWilma Cunningham, female   DOB: [birth year], 76 y.o.   MRN: [id number]  Interventional Radiology:  CXR immediately after left thoracentesis shows small left basilar PTX from lack of complete re-expansion of left lung.  No residual pleural fluid.  Recommend follow up CXR in AM.  Sherrine MaplesGlenn T. Fredia SorrowYamagata, M.D Pager:  [phone]

## 2017-08-20 NOTE — Procedures (Signed)
Interventional Radiology Procedure Note  Procedure: Ultrasound guided left thoracentesis  Complications: None  Estimated Blood Loss: None  Findings: 700 mL clear, yellow fluid removed from left pleural space.  Post-CXR pending.  Jodi MarbleGlenn T. Fredia SorrowYamagata, M.D Pager:  [phone]

## 2017-08-20 NOTE — Op Note (Signed)
OPERATIVE NOTE    PRE-OPERATIVE DIAGNOSIS: 1. ESRD   POST-OPERATIVE DIAGNOSIS: same as above  PROCEDURE: 1. Ultrasound guidance for vascular access to the right internal jugular vein 2. Fluoroscopic guidance for placement of catheter 3. Placement of a 19 cm tip to cuff tunneled hemodialysis catheter via the right internal jugular vein  SURGEON: Festus BarrenJason Destin Vinsant, MD  ANESTHESIA:  Local with Moderate conscious sedation for approximately 15 minutes using 5 mg of Versed and 125 mcg of Fentanyl  ESTIMATED BLOOD LOSS: 15 cc  FLUORO TIME: less than one minute  CONTRAST: none  FINDING(S): 1.  Patent right internal jugular vein  SPECIMEN(S):  None  INDICATIONS:   Ann Cunningham is a 76 y.o.female who presents with renal failure.  The patient needs long term dialysis access for their ESRD, and a Permcath is necessary.  Risks and benefits are discussed and informed consent is obtained.    DESCRIPTION: After obtaining full informed written consent, the patient was brought back to the vascular suited. The patient's right neck and chest were sterilely prepped and draped in a sterile surgical field was created. Moderate conscious sedation was administered during a face to face encounter with the patient throughout the procedure with my supervision of the RN administering medicines and monitoring the patient's vital signs, pulse oximetry, telemetry and mental status throughout from the start of the procedure until the patient was taken to the recovery room.  The right internal jugular vein was visualized with ultrasound and found to be patent. It was then accessed under direct ultrasound guidance and a permanent image was recorded. A wire was placed. After skin nick and dilatation, the peel-away sheath was placed over the wire. I then turned my attention to an area under the clavicle. Approximately 1-2 fingerbreadths below the clavicle a small counterincision was created and tunneled from the subclavicular  incision to the access site. Using fluoroscopic guidance, a 19 centimeter tip to cuff tunneled hemodialysis catheter was selected, and tunneled from the subclavicular incision to the access site. It was then placed through the peel-away sheath and the peel-away sheath was removed. Using fluoroscopic guidance the catheter tips were parked in the right atrium. The appropriate distal connectors were placed. It withdrew blood well and flushed easily with heparinized saline and a concentrated heparin solution was then placed. It was secured to the chest wall with 2 Prolene sutures. The access incision was closed single 4-0 Monocryl. A 4-0 Monocryl pursestring suture was placed around the exit site. Sterile dressings were placed. The patient tolerated the procedure well and was taken to the recovery room in stable condition.  COMPLICATIONS: None  CONDITION: Stable  Festus BarrenJason Dio Giller, MD 08/20/2017 12:44 PM   This note was created with Dragon Medical transcription system. Any errors in dictation are purely unintentional.

## 2017-08-20 NOTE — Progress Notes (Signed)
[facility]  ROUNDING NOTE   Subjective:   Left thoracentesis today 700mL.   Right IJ permcath placed today. Hemodialysis first treatment for today.   Objective:  Vital signs in last 24 hours:  Temp:  [97.4 F (36.3 C)-98.6 F (37 C)] 97.5 F (36.4 C) (11/26 1349) Pulse Rate:  [64-97] 80 (11/26 1349) Resp:  [11-25] 12 (11/26 1349) BP: (78-160)/(50-78) 92/57 (11/26 1349) SpO2:  [91 %-98 %] 96 % (11/26 1349) Weight:  [64.4 kg (142 lb)] 64.4 kg (142 lb) (11/26 1147)  Weight change:  Filed Weights   08/17/17 1935 08/19/17 0512 08/20/17 1147  Weight: 65.2 kg (143 lb 12.8 oz) 64.7 kg (142 lb 9.6 oz) 64.4 kg (142 lb)    Intake/Output: I/O last 3 completed shifts: In: 3800 [Other:3800] Out: 0    Intake/Output this shift:  No intake/output data recorded.  Physical Exam: General: NAD,   Head: Normocephalic, atraumatic. Moist oral mucosal membranes  Eyes: Anicteric, PERRL  Neck: Supple, trachea midline  Lungs:  Clear to auscultation  Heart: Regular rate and rhythm  Abdomen:  Soft, nontender,   Extremities: no peripheral edema.  Neurologic: Nonfocal, moving all four extremities  Skin: No lesions  Access: PD catheter, RIJ permcath    Basic Metabolic Panel: Recent Labs  Lab 08/13/17 2353 08/15/17 0430 08/17/17 2021 08/18/17 0421 08/20/17 0429  NA 142 143 141 140 138  K 5.2* 4.7 4.7 4.5 3.6  CL 111 113* 107 110 105  CO2 21* 21* 20* 21* 23  GLUCOSE 104* 107* 84 92 88  BUN 58* 57* 73* 70* 57*  CREATININE 4.82* 4.80* 5.29* 5.30* 4.47*  CALCIUM 8.6* 8.4* 8.3* 7.9* 7.5*  MG 2.3  --   --   --   --   PHOS  --   --   --  4.7*  --     Liver Function Tests: No results for input(s): AST, ALT, ALKPHOS, BILITOT, PROT, ALBUMIN in the last 168 hours. No results for input(s): LIPASE, AMYLASE in the last 168 hours. No results for input(s): AMMONIA in the last 168 hours.  CBC: Recent Labs  Lab 08/13/17 2353 08/17/17 2021 08/18/17 0421 08/20/17 0429  WBC  4.7 7.3 6.4 5.1  NEUTROABS  --  5.5  --   --   HGB 10.2* 8.5* 7.8* 8.6*  HCT 31.7* 26.0* 23.3* 26.4*  MCV 88.8 88.6 87.9 88.1  PLT 187 187 180 218    Cardiac Enzymes: Recent Labs  Lab 08/13/17 2353  TROPONINI <0.03    BNP: Invalid input(s): POCBNP  CBG: No results for input(s): GLUCAP in the last 168 hours.  Microbiology: No results found for this or any previous visit.  Coagulation Studies: No results for input(s): LABPROT, INR in the last 72 hours.  Urinalysis: No results for input(s): COLORURINE, LABSPEC, PHURINE, GLUCOSEU, HGBUR, BILIRUBINUR, KETONESUR, PROTEINUR, UROBILINOGEN, NITRITE, LEUKOCYTESUR in the last 72 hours.  Invalid input(s): APPERANCEUR    Imaging: Dg Chest 1 View  Result Date: 08/20/2017 CLINICAL DATA:  Post thoracentesis. EXAM: CHEST 1 VIEW COMPARISON:  08/17/2017. FINDINGS: Trachea is midline. Heart size is grossly stable. There is a partially loculated left pneumothorax, small to moderate in size. No residual left pleural fluid. Airspace consolidation in the left upper and left lower lobes. Small to moderate right pleural effusion with right basilar collapse/ consolidation. IMPRESSION: 1. Small to moderate left pneumothorax after left thoracentesis. Some or all of the pleural air may be due to failure of diseased lung to fully re-expand.  This was discussed with Dr. Irish LackGlenn Yamagata at 1140 hours. 2. Bibasilar airspace consolidation may be due to pneumonia. 3. Small to moderate right pleural effusion. Electronically Signed   By: Leanna BattlesMelinda  Blietz M.D.   On: 08/20/2017 11:40   [place] Thoracentesis Asp Pleural Space W/img Guide  Result Date: 08/20/2017 CLINICAL DATA:  Recurrent left pleural effusion and respiratory failure. Status post left thoracentesis yielding 800 mL of pleural fluid on 08/14/2017. EXAM: ULTRASOUND GUIDED THORACENTESIS COMPARISON:  08/14/2017 thoracentesis and chest x-ray on 08/17/2017. PROCEDURE: An ultrasound guided thoracentesis was  thoroughly discussed with the patient and questions answered. The benefits, risks, alternatives and complications were also discussed. The patient understands and wishes to proceed with the procedure. Written consent was obtained. Ultrasound was performed to localize and mark an adequate pocket of fluid in the left chest. The area was then prepped and draped in the normal sterile fashion. 1% Lidocaine was used for local anesthesia. Under ultrasound guidance a 6 French Safe-T-Centesis catheter was introduced. Thoracentesis was performed. The catheter was removed and a dressing applied. COMPLICATIONS: None FINDINGS: A total of approximately 700 mL of clear, yellow fluid was removed. A fluid sample was sent for laboratory analysis. IMPRESSION: Successful ultrasound guided left thoracentesis yielding 700 mL of pleural fluid. Electronically Signed   By: Irish LackGlenn  Yamagata M.D.   On: 08/20/2017 12:03     Medications:    . amLODipine  5 mg Oral Daily  . aspirin EC  81 mg Oral Daily  . diclofenac sodium  2 g Topical QID  . feeding supplement (NEPRO CARB STEADY)  237 mL Oral TID BM  . furosemide  80 mg Oral Daily  . gentamicin cream  1 application Topical Daily  . heparin  5,000 Units Subcutaneous Q8H  . levothyroxine  25 mcg Oral QAC breakfast  . multivitamin  1 tablet Oral QHS   acetaminophen **OR** acetaminophen, brinzolamide **AND** brimonidine, heparin  Assessment/ Plan:  [name] is a 76 y.o. black female with end stage renal disease, congestive heart failure, hypertension, anemia, secondary hyperparathyroidism  1.  ESRD: failed peritoneal dialysis. Unable to get family support. Patient with limited sight and reading capabilities.  Now transitioning to hemodialysis. First hemodialysis treatment for today.  [facility].   2.  Anemia of chronic kidney disease: hemoglobin 8.6 - EPO with HD treatment .  Consider starting Epogen with  hemodialysis.  3.  Secondary hyperparathyroidism: PTH 185. Phosphorus and calcium at goal.   4. Hypertension: hypotensive  - regimen of amlodipine and furosemide.    LOS: 3 Ann Cunningham 11/26/20182:38 PM

## 2017-08-21 LAB — BASIC METABOLIC PANEL
ANION GAP: 9 (ref 5–15)
BUN: 38 mg/dL — AB (ref 6–20)
CALCIUM: 7.5 mg/dL — AB (ref 8.9–10.3)
CO2: 24 mmol/L (ref 22–32)
Chloride: 105 mmol/L (ref 101–111)
Creatinine, Ser: 3.31 mg/dL — ABNORMAL HIGH (ref 0.44–1.00)
GFR calc Af Amer: 15 mL/min — ABNORMAL LOW (ref >60)
GFR, EST NON AFRICAN AMERICAN: 13 mL/min — AB (ref >60)
GLUCOSE: 100 mg/dL — AB (ref 65–99)
Potassium: 3.4 mmol/L — ABNORMAL LOW (ref 3.5–5.1)
Sodium: 138 mmol/L (ref 135–145)

## 2017-08-21 LAB — ACID FAST SMEAR (AFB, MYCOBACTERIA)

## 2017-08-21 LAB — ACID FAST SMEAR (AFB): ACID FAST SMEAR - AFSCU2: NEGATIVE

## 2017-08-21 LAB — CBC
HCT: 25.4 % — ABNORMAL LOW (ref 35.0–47.0)
HEMOGLOBIN: 8.1 g/dL — AB (ref 12.0–16.0)
MCH: 28.1 pg (ref 26.0–34.0)
MCHC: 31.8 g/dL — AB (ref 32.0–36.0)
MCV: 88.3 fL (ref 80.0–100.0)
Platelets: 202 10*3/uL (ref 150–440)
RBC: 2.88 MIL/uL — ABNORMAL LOW (ref 3.80–5.20)
RDW: 14.9 % — AB (ref 11.5–14.5)
WBC: 5.4 10*3/uL (ref 3.6–11.0)

## 2017-08-21 LAB — GLUCOSE, CAPILLARY: GLUCOSE-CAPILLARY: 97 mg/dL (ref 65–99)

## 2017-08-21 NOTE — Progress Notes (Signed)
HD completed without issue. Unable to UF due to low bp. Patient remained asymptomatic throughout. UF +22550mL. Post HD BP 108/49. Dr. Wynelle LinkKolluru aware. Report given to primary RN.

## 2017-08-21 NOTE — Progress Notes (Signed)
Pre hd 

## 2017-08-21 NOTE — Clinical Social Work Note (Signed)
CSW extended bed offers to patient and she chose [facility]. [facility] has been notified. York SpanielMonica Mar Walmer MSW,LCSW [phone]

## 2017-08-21 NOTE — Progress Notes (Signed)
Post HD assessment unchanged  

## 2017-08-21 NOTE — Progress Notes (Signed)
Contacted Dr. Amado CoeGouru about bp of 78/41 post dialysis treatment. Per Dr. Amado CoeGouru "monitor patient and notify if she becomes symptomatic re-check BP at 1600 ans see where it is, if she has any blood pressure meds do not give them". Will notify provider with any deterioration in patients condition.

## 2017-08-21 NOTE — Progress Notes (Signed)
Pre HD  

## 2017-08-21 NOTE — Progress Notes (Signed)
PT Cancellation Note  Patient Details Name: Ann CoolerWilma Cunningham MRN: [id number] DOB: [birth year]   Cancelled Treatment:    Reason Eval/Treat Not Completed: Patient at procedure or test/unavailable.  Pt currently off unit for dialysis.  Will re-attempt PT treatment at a later date/time.  Hendricks LimesEmily Loukas Antonson, PT 08/21/17, 11:12 AM [phone]

## 2017-08-21 NOTE — Progress Notes (Signed)
[facility]  ROUNDING NOTE   Subjective:   First hemodialysis treatment yesterday. Tolerated treatment well. UF of even.   Seen and examined on second hemodialysis treatment. Tolerating treatment well. UF goal of 500mL.   Complains of sacral pain.   Objective:  Vital signs in last 24 hours:  Temp:  [97.4 F (36.3 C)-99.2 F (37.3 C)] 98 F (36.7 C) (11/27 0951) Pulse Rate:  [70-97] 91 (11/27 0957) Resp:  [11-25] 19 (11/27 0957) BP: (77-126)/(50-74) 102/62 (11/27 0957) SpO2:  [91 %-100 %] 96 % (11/27 0957) Weight:  [59.8 kg (131 lb 13.4 oz)-64.4 kg (142 lb)] 59.8 kg (131 lb 13.4 oz) (11/27 0951)  Weight change:  Filed Weights   08/20/17 1450 08/20/17 1727 08/21/17 0951  Weight: 60.3 kg (132 lb 15 oz) 60.1 kg (132 lb 7.9 oz) 59.8 kg (131 lb 13.4 oz)    Intake/Output: I/O last 3 completed shifts: In: 237 [P.O.:237] Out: -192    Intake/Output this shift:  Total I/O In: 90 [P.O.:90] Out: 0   Physical Exam: General: NAD, laying in bed.   Head: Normocephalic, atraumatic. Moist oral mucosal membranes  Eyes: Anicteric, PERRL  Neck: Supple, trachea midline  Lungs:  Clear to auscultation  Heart: Regular rate and rhythm  Abdomen:  Soft, nontender  Extremities: no peripheral edema.  Neurologic: Nonfocal, moving all four extremities  Skin: No lesions  Access: PD catheter, RIJ permcath    Basic Metabolic Panel: Recent Labs  Lab 08/15/17 0430 08/17/17 2021 08/18/17 0421 08/20/17 0429 08/21/17 0459  NA 143 141 140 138 138  K 4.7 4.7 4.5 3.6 3.4*  CL 113* 107 110 105 105  CO2 21* 20* 21* 23 24  GLUCOSE 107* 84 92 88 100*  BUN 57* 73* 70* 57* 38*  CREATININE 4.80* 5.29* 5.30* 4.47* 3.31*  CALCIUM 8.4* 8.3* 7.9* 7.5* 7.5*  PHOS  --   --  4.7*  --   --     Liver Function Tests: No results for input(s): AST, ALT, ALKPHOS, BILITOT, PROT, ALBUMIN in the last 168 hours. No results for input(s): LIPASE, AMYLASE in the last 168 hours. No results for  input(s): AMMONIA in the last 168 hours.  CBC: Recent Labs  Lab 08/17/17 2021 08/18/17 0421 08/20/17 0429 08/21/17 0459  WBC 7.3 6.4 5.1 5.4  NEUTROABS 5.5  --   --   --   HGB 8.5* 7.8* 8.6* 8.1*  HCT 26.0* 23.3* 26.4* 25.4*  MCV 88.6 87.9 88.1 88.3  PLT 187 180 218 202    Cardiac Enzymes: No results for input(s): CKTOTAL, CKMB, CKMBINDEX, TROPONINI in the last 168 hours.  BNP: Invalid input(s): POCBNP  CBG: No results for input(s): GLUCAP in the last 168 hours.  Microbiology: Results for orders placed or performed during the hospital encounter of 08/17/17  Body fluid culture     Status: None (Preliminary result)   Collection Time: 08/20/17 11:08 AM  Result Value Ref Range Status   Specimen Description PLEURAL  Final   Special Requests NONE  Final   Gram Stain   Final    RARE WBC PRESENT, PREDOMINANTLY PMN NO ORGANISMS SEEN    Culture   Final    NO GROWTH < 24 HOURS Performed at [facility], [address] [place], [place] [address]    Report Status PENDING  Incomplete    Coagulation Studies: No results for input(s): LABPROT, INR in the last 72 hours.  Urinalysis: No results for input(s): COLORURINE, LABSPEC, PHURINE,  GLUCOSEU, HGBUR, BILIRUBINUR, KETONESUR, PROTEINUR, UROBILINOGEN, NITRITE, LEUKOCYTESUR in the last 72 hours.  Invalid input(s): APPERANCEUR    Imaging: Dg Chest 1 View  Result Date: 08/20/2017 CLINICAL DATA:  Post thoracentesis. EXAM: CHEST 1 VIEW COMPARISON:  08/17/2017. FINDINGS: Trachea is midline. Heart size is grossly stable. There is a partially loculated left pneumothorax, small to moderate in size. No residual left pleural fluid. Airspace consolidation in the left upper and left lower lobes. Small to moderate right pleural effusion with right basilar collapse/ consolidation. IMPRESSION: 1. Small to moderate left pneumothorax after left thoracentesis. Some or all of the pleural air may be due to failure of diseased lung to fully  re-expand. This was discussed with Dr. Irish LackGlenn Yamagata at 1140 hours. 2. Bibasilar airspace consolidation may be due to pneumonia. 3. Small to moderate right pleural effusion. Electronically Signed   By: Leanna BattlesMelinda  Blietz M.D.   On: 08/20/2017 11:40   [place] Thoracentesis Asp Pleural Space W/img Guide  Result Date: 08/20/2017 CLINICAL DATA:  Recurrent left pleural effusion and respiratory failure. Status post left thoracentesis yielding 800 mL of pleural fluid on 08/14/2017. EXAM: ULTRASOUND GUIDED THORACENTESIS COMPARISON:  08/14/2017 thoracentesis and chest x-ray on 08/17/2017. PROCEDURE: An ultrasound guided thoracentesis was thoroughly discussed with the patient and questions answered. The benefits, risks, alternatives and complications were also discussed. The patient understands and wishes to proceed with the procedure. Written consent was obtained. Ultrasound was performed to localize and mark an adequate pocket of fluid in the left chest. The area was then prepped and draped in the normal sterile fashion. 1% Lidocaine was used for local anesthesia. Under ultrasound guidance a 6 French Safe-T-Centesis catheter was introduced. Thoracentesis was performed. The catheter was removed and a dressing applied. COMPLICATIONS: None FINDINGS: A total of approximately 700 mL of clear, yellow fluid was removed. A fluid sample was sent for laboratory analysis. IMPRESSION: Successful ultrasound guided left thoracentesis yielding 700 mL of pleural fluid. Electronically Signed   By: Irish LackGlenn  Yamagata M.D.   On: 08/20/2017 12:03     Medications:    . amLODipine  5 mg Oral Daily  . aspirin EC  81 mg Oral Daily  . diclofenac sodium  2 g Topical QID  . epoetin (EPOGEN/PROCRIT) injection  10,000 Units Intravenous To Hemo  . feeding supplement (NEPRO CARB STEADY)  237 mL Oral TID BM  . furosemide  80 mg Oral Daily  . gentamicin cream  1 application Topical Daily  . heparin  5,000 Units Subcutaneous Q8H  . levothyroxine   25 mcg Oral QAC breakfast  . multivitamin  1 tablet Oral QHS   acetaminophen **OR** acetaminophen, brinzolamide **AND** brimonidine, heparin  Assessment/ Plan:  Ms. Dorthy CoolerWilma Peffley is a 76 y.o. black female with end stage renal disease, congestive heart failure, hypertension, anemia, secondary hyperparathyroidism  1.  ESRD: failed peritoneal dialysis. Unable to get family support. Patient with limited sight and reading capabilities.  Now transitioning to hemodialysis. First hemodialysis treatment on 11/26 Seen and examined on second hemodialysis treatment today. Tolerating treatment well. Third treatment for tomorrow. Scheduled to be in a chair.  [facility]   2.  Anemia of chronic kidney disease: hemoglobin 8.1. Did not get epo yesterday.  - EPO with HD treatment .     3.  Secondary hyperparathyroidism: PTH 185. Phosphorus and calcium at goal.   4. Hypertension: hypotensive  - discontinue both furosemide and amlodipine until blood pressure stablizes.    LOS: 4 Alakai Macbride  11/27/201810:17 AM

## 2017-08-21 NOTE — Progress Notes (Signed)
PT Cancellation Note  Patient Details Name: Ann CoolerWilma Cunningham MRN: [id number] DOB: [birth year]   Cancelled Treatment:    Reason Eval/Treat Not Completed: Medical issues which prohibited therapy   Chart reviewed.  BP noted to be 78/41 on last reading.  Pt in bed stating she is having a "rough" day.  Declined to have BP rechecked and refused session at this time.  Will continue as appropriate.   Danielle DessSarah Serrina Minogue 08/21/2017, 3:22 PM

## 2017-08-21 NOTE — Progress Notes (Signed)
[facility] [place] at [facility]   PATIENT NAME: Ann CoolerWilma Gauer    MR#:  [id number]  DATE OF BIRTH:  1941-04-01  SUBJECTIVE:  CHIEF COMPLAINT: Patient is feeling very weak and tired, reports  Improved shortness of breath with exertion.  Had thoracentesis , permacath placement yesterday, hemodialysis yesterday and today  REVIEW OF SYSTEMS:  CONSTITUTIONAL: No fever, fatigue or weakness.  EYES: No blurred or double vision.  EARS, NOSE, AND THROAT: No tinnitus or ear pain.  RESPIRATORY: No cough, improved shortness of breath, denies wheezing or hemoptysis.  CARDIOVASCULAR: No chest pain, orthopnea, edema.  GASTROINTESTINAL: No nausea, vomiting, diarrhea or abdominal pain.  GENITOURINARY: No dysuria, hematuria.  ENDOCRINE: No polyuria, nocturia,  HEMATOLOGY: No anemia, easy bruising or bleeding SKIN: No rash or lesion.  Left forearm burn MUSCULOSKELETAL: No joint pain or arthritis.   NEUROLOGIC: No tingling, numbness, weakness.  PSYCHIATRY: No anxiety or depression.   DRUG ALLERGIES:  No Known Allergies  VITALS:  Blood pressure (!) 78/41, pulse 91, temperature 98.4 F (36.9 C), temperature source Oral, resp. rate (!) 21, height 5\' 5"  (1.651 m), weight 60.1 kg (132 lb 7.9 oz), SpO2 99 %.  PHYSICAL EXAMINATION:  GENERAL:  76 y.o.-year-old patient lying in the bed with no acute distress.  EYES: Pupils equal, round, reactive to light and accommodation. No scleral icterus. Extraocular muscles intact.  HEENT: Head atraumatic, normocephalic. Oropharynx and nasopharynx clear.  NECK:  Supple, no jugular venous distention. No thyroid enlargement, no tenderness.  LUNGS: Moderate breath sounds bilaterally, no wheezing, has no rales,rhonchi or crepitation. No use of accessory muscles of respiration.  CARDIOVASCULAR: S1, S2 normal. No murmurs, rubs, or gallops.  ABDOMEN: Soft, nontender, has peritoneal dialysis catheter with clean dressing nondistended. Bowel sounds  present. No organomegaly or mass.  EXTREMITIES: Left forearm burn, no pedal edema, cyanosis, or clubbing.  NEUROLOGIC: Cranial nerves II through XII are intact. Muscle strength diffusely diminished in all  extremities. Sensation intact. Gait not checked.  PSYCHIATRIC: The patient is alert and oriented x 3.  SKIN: No obvious rash, lesion, or ulcer.    LABORATORY PANEL:   CBC Recent Labs  Lab 08/21/17 0459  WBC 5.4  HGB 8.1*  HCT 25.4*  PLT 202   ------------------------------------------------------------------------------------------------------------------  Chemistries  Recent Labs  Lab 08/21/17 0459  NA 138  K 3.4*  CL 105  CO2 24  GLUCOSE 100*  BUN 38*  CREATININE 3.31*  CALCIUM 7.5*   ------------------------------------------------------------------------------------------------------------------  Cardiac Enzymes No results for input(s): TROPONINI in the last 168 hours. ------------------------------------------------------------------------------------------------------------------  RADIOLOGY:  Dg Chest 1 View  Result Date: 08/20/2017 CLINICAL DATA:  Post thoracentesis. EXAM: CHEST 1 VIEW COMPARISON:  08/17/2017. FINDINGS: Trachea is midline. Heart size is grossly stable. There is a partially loculated left pneumothorax, small to moderate in size. No residual left pleural fluid. Airspace consolidation in the left upper and left lower lobes. Small to moderate right pleural effusion with right basilar collapse/ consolidation. IMPRESSION: 1. Small to moderate left pneumothorax after left thoracentesis. Some or all of the pleural air may be due to failure of diseased lung to fully re-expand. This was discussed with Dr. Irish LackGlenn Yamagata at 1140 hours. 2. Bibasilar airspace consolidation may be due to pneumonia. 3. Small to moderate right pleural effusion. Electronically Signed   By: Leanna BattlesMelinda  Blietz M.D.   On: 08/20/2017 11:40   [place] Thoracentesis Asp Pleural Space W/img  Guide  Result Date: 08/20/2017 CLINICAL DATA:  Recurrent left pleural effusion and respiratory  failure. Status post left thoracentesis yielding 800 mL of pleural fluid on 08/14/2017. EXAM: ULTRASOUND GUIDED THORACENTESIS COMPARISON:  08/14/2017 thoracentesis and chest x-ray on 08/17/2017. PROCEDURE: An ultrasound guided thoracentesis was thoroughly discussed with the patient and questions answered. The benefits, risks, alternatives and complications were also discussed. The patient understands and wishes to proceed with the procedure. Written consent was obtained. Ultrasound was performed to localize and mark an adequate pocket of fluid in the left chest. The area was then prepped and draped in the normal sterile fashion. 1% Lidocaine was used for local anesthesia. Under ultrasound guidance a 6 French Safe-T-Centesis catheter was introduced. Thoracentesis was performed. The catheter was removed and a dressing applied. COMPLICATIONS: None FINDINGS: A total of approximately 700 mL of clear, yellow fluid was removed. A fluid sample was sent for laboratory analysis. IMPRESSION: Successful ultrasound guided left thoracentesis yielding 700 mL of pleural fluid. Electronically Signed   By: Irish LackGlenn  Yamagata M.D.   On: 08/20/2017 12:03    EKG:   Orders placed or performed during the hospital encounter of 08/13/17  . ED EKG  . ED EKG  . EKG 12-Lead  . EKG 12-Lead    ASSESSMENT AND PLAN:   #Acute respiratory failure from bilateral pleural effusions Left greater than right s/p Ultrasound-guided thoracentesis and fluid analysis done by interventional radiology today and had 900 cc of clear fluid taken out 11/26 Continue oxygen via nasal cannula  #End-stage renal disease patient failed peritoneal dialysis and unable to get family or friend support  Permacath placed 11/26  by vascular surgery in the right IJ For hemodialysis yesterday and  today Follow-up with nephrology, [facility].     #History of systolic congestive heart failure with cardiomyopathy On Lasix, cannot start ACE inhibitor in view of renal insufficiency which is getting worse We will add low-dose beta-blocker if blood pressure permits  #Hypertension , currently hypotensive following dialysis but asymptomatic hold antihypertensives  #Anemia of chronic kidney disease hemoglobin at 7.8 will transfuse if hemoglobin is less than 7  #Failure to thrive at home.  Unable to do activities of daily living.  Poor appetite and weight loss.  Patient would benefit from nursing home facility.     Disposition skilled nursing facility  All the records are reviewed and case discussed with Care Management/Social Workerr. Management plans discussed with the patient, niece [name] HCPOA and they are in agreement.  CODE STATUS: dnr  TOTAL TIME TAKING CARE OF THIS PATIENT: 36  minutes.   POSSIBLE D/C IN 1 DAYS, DEPENDING ON CLINICAL CONDITION.  Note: This dictation was prepared with Dragon dictation along with smaller phrase technology. Any transcriptional errors that result from this process are unintentional.   Ramonita LabGouru, Lawson Mahone M.D on 08/21/2017 at 3:56 PM  Between 7am to 6pm - Pager - [phone] After 6pm go to [email or web address] - password [email or web address] [facility]  [facility]  Office  [phone]  CC: Primary care physician; [facility]

## 2017-08-21 NOTE — Progress Notes (Signed)
HD initiated via R Chest HD cath. Dressing saturated with old blood. Dressing changed. Currently not bleeding. No heparin treatment. Continue to monitor.

## 2017-08-22 LAB — BASIC METABOLIC PANEL
Anion gap: 6 (ref 5–15)
BUN: 24 mg/dL — AB (ref 6–20)
CO2: 28 mmol/L (ref 22–32)
Calcium: 7.4 mg/dL — ABNORMAL LOW (ref 8.9–10.3)
Chloride: 104 mmol/L (ref 101–111)
Creatinine, Ser: 2.34 mg/dL — ABNORMAL HIGH (ref 0.44–1.00)
GFR, EST AFRICAN AMERICAN: 22 mL/min — AB (ref >60)
GFR, EST NON AFRICAN AMERICAN: 19 mL/min — AB (ref >60)
Glucose, Bld: 100 mg/dL — ABNORMAL HIGH (ref 65–99)
POTASSIUM: 3.7 mmol/L (ref 3.5–5.1)
SODIUM: 138 mmol/L (ref 135–145)

## 2017-08-22 LAB — CBC
HEMATOCRIT: 25.9 % — AB (ref 35.0–47.0)
Hemoglobin: 8.3 g/dL — ABNORMAL LOW (ref 12.0–16.0)
MCH: 28.4 pg (ref 26.0–34.0)
MCHC: 31.9 g/dL — ABNORMAL LOW (ref 32.0–36.0)
MCV: 88.9 fL (ref 80.0–100.0)
PLATELETS: 223 10*3/uL (ref 150–440)
RBC: 2.91 MIL/uL — ABNORMAL LOW (ref 3.80–5.20)
RDW: 14.9 % — AB (ref 11.5–14.5)
WBC: 6.7 10*3/uL (ref 3.6–11.0)

## 2017-08-22 MED ORDER — GENTAMICIN SULFATE 0.1 % EX CREA: 1.0000 "application " | TOPICAL_CREAM | Freq: Every day | CUTANEOUS | 0 refills | Status: DC

## 2017-08-22 MED ORDER — RENA-VITE PO TABS: 1.0000 | ORAL_TABLET | Freq: Every day | ORAL | 0 refills | Status: AC

## 2017-08-22 MED ORDER — BISOPROLOL FUMARATE 5 MG PO TABS: 5.0000 mg | ORAL_TABLET | Freq: Every day | ORAL | 0 refills | Status: DC

## 2017-08-22 MED ORDER — BISOPROLOL FUMARATE 5 MG PO TABS
5.0000 mg | ORAL_TABLET | Freq: Every day | ORAL | Status: DC
  Filled 2017-08-22: qty 1

## 2017-08-22 MED ORDER — NEPRO/CARBSTEADY PO LIQD: 237.0000 mL | Freq: Three times a day (TID) | ORAL | 0 refills | Status: DC

## 2017-08-22 MED ORDER — BRINZOLAMIDE 1 % OP SUSP: 1.0000 [drp] | Freq: Every day | OPHTHALMIC | 0 refills | Status: DC

## 2017-08-22 MED ORDER — ACETAMINOPHEN 325 MG PO TABS: 650.0000 mg | ORAL_TABLET | Freq: Four times a day (QID) | ORAL | 0 refills | Status: DC | PRN

## 2017-08-22 MED ORDER — BRIMONIDINE TARTRATE 0.2 % OP SOLN: 1.0000 [drp] | Freq: Every day | OPHTHALMIC | 0 refills | Status: AC | PRN

## 2017-08-22 NOTE — Progress Notes (Signed)
Post HD  

## 2017-08-22 NOTE — Progress Notes (Signed)
[facility]  ROUNDING NOTE   Subjective:   Seen and examined on hemodialysis. Tolerating treatment well. Third treatment.  UF goal 0.5.    HEMODIALYSIS FLOWSHEET:  Blood Flow Rate (mL/min): 250 mL/min Arterial Pressure (mmHg): -90 mmHg Venous Pressure (mmHg): 80 mmHg Transmembrane Pressure (mmHg): 20 mmHg Ultrafiltration Rate (mL/min): 150 mL/min Dialysate Flow Rate (mL/min): 500 ml/min Conductivity: Machine : 13.9 Conductivity: Machine : 13.9 Dialysis Fluid Bolus: Normal Saline Bolus Amount (mL): 100 mL Dialysate Change: 4K   Objective:  Vital signs in last 24 hours:  Temp:  [97.8 F (36.6 C)-98.4 F (36.9 C)] 98.1 F (36.7 C) (11/28 0523) Pulse Rate:  [83-97] 97 (11/28 0523) Resp:  [18-23] 18 (11/27 1955) BP: (78-123)/(41-60) 123/60 (11/28 0523) SpO2:  [94 %-99 %] 97 % (11/28 0523) Weight:  [58.7 kg (129 lb 6.6 oz)-60.1 kg (132 lb 7.9 oz)] 58.7 kg (129 lb 6.6 oz) (11/28 0500)  Weight change: -4.611 kg (-2.6 oz) Filed Weights   08/21/17 0951 08/21/17 1228 08/22/17 0500  Weight: 59.8 kg (131 lb 13.4 oz) 60.1 kg (132 lb 7.9 oz) 58.7 kg (129 lb 6.6 oz)    Intake/Output: I/O last 3 completed shifts: In: 804 [P.O.:804] Out: -264    Intake/Output this shift:  No intake/output data recorded.  Physical Exam: General: NAD, laying in bed.   Head: Normocephalic, atraumatic. Moist oral mucosal membranes  Eyes: Anicteric, PERRL  Neck: Supple, trachea midline  Lungs:  Clear to auscultation  Heart: Regular rate and rhythm  Abdomen:  Soft, nontender  Extremities: no peripheral edema.  Neurologic: Nonfocal, moving all four extremities  Skin: No lesions  Access: PD catheter, RIJ permcath    Basic Metabolic Panel: Recent Labs  Lab 08/17/17 2021 08/18/17 0421 08/20/17 0429 08/21/17 0459 08/22/17 0408  NA 141 140 138 138 138  K 4.7 4.5 3.6 3.4* 3.7  CL 107 110 105 105 104  CO2 20* 21* 23 24 28   GLUCOSE 84 92 88 100* 100*  BUN 73* 70* 57* 38* 24*   CREATININE 5.29* 5.30* 4.47* 3.31* 2.34*  CALCIUM 8.3* 7.9* 7.5* 7.5* 7.4*  PHOS  --  4.7*  --   --   --     Liver Function Tests: No results for input(s): AST, ALT, ALKPHOS, BILITOT, PROT, ALBUMIN in the last 168 hours. No results for input(s): LIPASE, AMYLASE in the last 168 hours. No results for input(s): AMMONIA in the last 168 hours.  CBC: Recent Labs  Lab 08/17/17 2021 08/18/17 0421 08/20/17 0429 08/21/17 0459  WBC 7.3 6.4 5.1 5.4  NEUTROABS 5.5  --   --   --   HGB 8.5* 7.8* 8.6* 8.1*  HCT 26.0* 23.3* 26.4* 25.4*  MCV 88.6 87.9 88.1 88.3  PLT 187 180 218 202    Cardiac Enzymes: No results for input(s): CKTOTAL, CKMB, CKMBINDEX, TROPONINI in the last 168 hours.  BNP: Invalid input(s): POCBNP  CBG: Recent Labs  Lab 08/21/17 1124  GLUCAP 97    Microbiology: Results for orders placed or performed during the hospital encounter of 08/17/17  Body fluid culture     Status: None (Preliminary result)   Collection Time: 08/20/17 11:08 AM  Result Value Ref Range Status   Specimen Description PLEURAL  Final   Special Requests NONE  Final   Gram Stain   Final    RARE WBC PRESENT, PREDOMINANTLY PMN NO ORGANISMS SEEN    Culture   Final    NO GROWTH 2 DAYS Performed at [facility], [address] [place], [place] [address]    Report Status PENDING  Incomplete  Acid Fast Smear (AFB)     Status: None   Collection Time: 08/20/17 11:08 AM  Result Value Ref Range Status   AFB Specimen Processing Concentration  Final   Acid Fast Smear Negative  Final    Comment: (NOTE) Performed At: [facility] [address] [place], [place] [id number] Jolene SchimkeNagendra Sanjai MD IO:[id number]:[phone]    Source (AFB) PLEURAL  Final    Coagulation Studies: No results for input(s): LABPROT, INR in the last 72 hours.  Urinalysis: No results for input(s): COLORURINE, LABSPEC, PHURINE, GLUCOSEU, HGBUR, BILIRUBINUR, KETONESUR, PROTEINUR, UROBILINOGEN, NITRITE, LEUKOCYTESUR in the  last 72 hours.  Invalid input(s): APPERANCEUR    Imaging: Dg Chest 1 View  Result Date: 08/20/2017 CLINICAL DATA:  Post thoracentesis. EXAM: CHEST 1 VIEW COMPARISON:  08/17/2017. FINDINGS: Trachea is midline. Heart size is grossly stable. There is a partially loculated left pneumothorax, small to moderate in size. No residual left pleural fluid. Airspace consolidation in the left upper and left lower lobes. Small to moderate right pleural effusion with right basilar collapse/ consolidation. IMPRESSION: 1. Small to moderate left pneumothorax after left thoracentesis. Some or all of the pleural air may be due to failure of diseased lung to fully re-expand. This was discussed with Dr. Irish LackGlenn Yamagata at 1140 hours. 2. Bibasilar airspace consolidation may be due to pneumonia. 3. Small to moderate right pleural effusion. Electronically Signed   By: Leanna BattlesMelinda  Blietz M.D.   On: 08/20/2017 11:40   [place] Thoracentesis Asp Pleural Space W/img Guide  Result Date: 08/20/2017 CLINICAL DATA:  Recurrent left pleural effusion and respiratory failure. Status post left thoracentesis yielding 800 mL of pleural fluid on 08/14/2017. EXAM: ULTRASOUND GUIDED THORACENTESIS COMPARISON:  08/14/2017 thoracentesis and chest x-ray on 08/17/2017. PROCEDURE: An ultrasound guided thoracentesis was thoroughly discussed with the patient and questions answered. The benefits, risks, alternatives and complications were also discussed. The patient understands and wishes to proceed with the procedure. Written consent was obtained. Ultrasound was performed to localize and mark an adequate pocket of fluid in the left chest. The area was then prepped and draped in the normal sterile fashion. 1% Lidocaine was used for local anesthesia. Under ultrasound guidance a 6 French Safe-T-Centesis catheter was introduced. Thoracentesis was performed. The catheter was removed and a dressing applied. COMPLICATIONS: None FINDINGS: A total of approximately 700 mL  of clear, yellow fluid was removed. A fluid sample was sent for laboratory analysis. IMPRESSION: Successful ultrasound guided left thoracentesis yielding 700 mL of pleural fluid. Electronically Signed   By: Irish LackGlenn  Yamagata M.D.   On: 08/20/2017 12:03     Medications:    . amLODipine  5 mg Oral Daily  . aspirin EC  81 mg Oral Daily  . diclofenac sodium  2 g Topical QID  . feeding supplement (NEPRO CARB STEADY)  237 mL Oral TID BM  . furosemide  80 mg Oral Daily  . gentamicin cream  1 application Topical Daily  . heparin  5,000 Units Subcutaneous Q8H  . levothyroxine  25 mcg Oral QAC breakfast  . multivitamin  1 tablet Oral QHS   acetaminophen **OR** acetaminophen, brinzolamide **AND** brimonidine, heparin  Assessment/ Plan:  Ann Cunningham is a 76 y.o. black female with end stage renal disease, congestive heart failure, hypertension, anemia, secondary hyperparathyroidism  1.  ESRD: failed peritoneal dialysis. Unable to get family support. Patient with limited sight and reading capabilities.  Transitioned to hemodialysis. First hemodialysis treatment on 11/26 Seen and examined on Third hemodialysis treatment today. Tolerating treatment well. Seated in chair.   Outpatient dialysis planning for [facility].   2.  Anemia of chronic kidney disease: hemoglobin 8.1.    - EPO with HD treatment TTS   3.  Secondary hyperparathyroidism: PTH 185. Phosphorus and calcium at goal. Not currently on binder therapy  4. Hypertension: hypotensive on treatment.  - discontinued furosemide and amlodipine      LOS: 5 Kaitlynn Tramontana 11/28/201811:11 AM

## 2017-08-22 NOTE — Clinical Social Work Note (Signed)
Patient discharging today to go to [facility]. Olegario MessierKathy at [facility] is aware and discharge information has been sent. Nurse to call report then EMS. CSW spoke with patient's niece: Marzetta MerinoLinda [place]: [phone] and notified her of the discharge. CSW has also notified Lazaro ArmsKay Flack at [facility].  York SpanielMonica Navarre Diana MSW,LCSW [phone]

## 2017-08-22 NOTE — Clinical Social Work Placement (Signed)
   CLINICAL SOCIAL WORK PLACEMENT  NOTE  Date:  08/22/2017  Patient Details  Name: Ann Cunningham MRN: [id number] Date of Birth: 04-07-1941  Clinical Social Work is seeking post-discharge placement for this patient at the Skilled  Nursing Facility level of care (*CSW will initial, date and re-position this form in  chart as items are completed):  Yes   Patient/family provided with [place] Clinical Social Work Department's list of facilities offering this level of care within the geographic area requested by the patient (or if unable, by the patient's family).  Yes   Patient/family informed of their freedom to choose among providers that offer the needed level of care, that participate in Medicare, Medicaid or managed care program needed by the patient, have an available bed and are willing to accept the patient.  Yes   Patient/family informed of [facility]'s ownership interest in [facility] and [facility], as well as of the fact that they are under no obligation to receive care at these facilities.  PASRR submitted to EDS on       PASRR number received on       Existing PASRR number confirmed on 08/18/17     FL2 transmitted to all facilities in geographic area requested by pt/family on 08/18/17     FL2 transmitted to all facilities within larger geographic area on       Patient informed that his/her managed care company has contracts with or will negotiate with certain facilities, including the following:            Patient/family informed of bed offers received.  Patient chooses bed at [facility])     Physician recommends and patient chooses bed at [facility](SNF)    Patient to be transferred to [employer]([facility]) on 08/22/17.  Patient to be transferred to facility by (EMS)     Patient family notified on 08/22/17 of transfer.  Name of family member notified:  [name]: niece)     PHYSICIAN Please sign DNR     Additional Comment:     _______________________________________________ York SpanielMonica Amyre Segundo, LCSW 08/22/2017, 3:49 PM

## 2017-08-22 NOTE — Progress Notes (Signed)
Hemodialysis started without issue. Patient in recliner for treatment today

## 2017-08-22 NOTE — Progress Notes (Signed)
Ann Cunningham to be D/C'd Skilled nursing facility per MD order.  Discussed prescriptions and follow up appointments with the patient. Prescriptions given to patient, medication list explained in detail. Pt verbalized understanding.  Allergies as of 08/22/2017   No Known Allergies     Medication List    STOP taking these medications   SIMBRINZA 1-0.2 % Susp Generic drug:  Brinzolamide-Brimonidine     TAKE these medications   acetaminophen 325 MG tablet Commonly known as:  TYLENOL Take 2 tablets (650 mg total) by mouth every 6 (six) hours as needed for mild pain (or Fever >/= 101).   amLODipine 5 MG tablet Commonly known as:  NORVASC Take 1 tablet (5 mg total) by mouth daily.   aspirin 81 MG tablet Take 81 mg by mouth daily.   bisoprolol 5 MG tablet Commonly known as:  ZEBETA Take 1 tablet (5 mg total) by mouth at bedtime.   brimonidine 0.2 % ophthalmic solution Commonly known as:  ALPHAGAN Place 1 drop into both eyes daily as needed (glaucoma).   brinzolamide 1 % ophthalmic suspension Commonly known as:  AZOPT Place 1 drop into both eyes at bedtime.   diclofenac sodium 1 % Gel Commonly known as:  VOLTAREN Apply 2 g topically 4 (four) times daily.   feeding supplement (NEPRO CARB STEADY) Liqd Take 237 mLs by mouth 3 (three) times daily between meals.   furosemide 80 MG tablet Commonly known as:  LASIX Take 1 tablet (80 mg total) by mouth daily.   gentamicin cream 0.1 % Commonly known as:  GARAMYCIN Apply 1 application topically daily. Start taking on:  08/23/2017   levothyroxine 25 MCG tablet Commonly known as:  SYNTHROID, LEVOTHROID Take 25 mcg by mouth daily before breakfast.   multivitamin Tabs tablet Take 1 tablet by mouth at bedtime.       Vitals:   08/22/17 1345 08/22/17 1600  BP: 131/73 130/72  Pulse: 90 89  Resp: 13 16  Temp: 98.7 F (37.1 C) 98.1 F (36.7 C)  SpO2: 100% 99%    Skin clean, dry and intact without evidence of skin break  down, no evidence of skin tears noted. IV catheter discontinued intact. Site without signs and symptoms of complications. Dressing and pressure applied. Pt denies pain at this time. No complaints noted.  An After Visit Summary was printed and given to the patient. Patient escorted via EMS, and D/C to facility   Ann Cunningham

## 2017-08-22 NOTE — Plan of Care (Signed)
Pt received third dialysis today and tolerated well sitting in chair.

## 2017-08-22 NOTE — Discharge Summary (Signed)
[employer] - [place] [facility]   PATIENT NAME: Ann CoolerWilma Cunningham    MR#:  [id number]  DATE OF BIRTH:  07-07-1941  DATE OF ADMISSION:  08/17/2017 ADMITTING PHYSICIAN: Alford Highlandichard Deontrey Massi, MD  DATE OF DISCHARGE: 08/22/2017  PRIMARY CARE PHYSICIAN: [facility]    ADMISSION DIAGNOSIS:  Weakness [R53.1] Pleural effusion [J90]  DISCHARGE DIAGNOSIS:  Active Problems:   Failure to thrive in adult   SECONDARY DIAGNOSIS:   Past Medical History:  Diagnosis Date  . CHF (congestive heart failure) (HCC)   . CKD (chronic kidney disease)   . Hypertension   . Hypothyroidism   . Renal disorder   . Uses walker     HOSPITAL COURSE:   1. Failure to thrive, severe malnutrition. Patient unable to do activities of daily living at home. Poor appetite and weight loss. Physical therapy recommended nursing home placement. 2. Acute respiratory failure with pleural effusions. A repeat left-sided thoracentesis removed 900 mL of clear fluid. Cytology still pending. Likely due to fluid overload with end-stage renal disease and chronic systolic congestive heart failure. 3. End-stage renal disease. Patient had a peritoneal dialysis catheter placed but was unable to do treatments with this. Permacath placed on 1126 by vascular surgery in the right IJ. Patient tolerated 3 hemodialysis as inpatient and will be set up with outpatient dialysis for Tuesday Thursday Saturday. 4. 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 On Lasix. Start low-dose bisoprolol at night. If blood pressure allows may be able to start ACE inhibitor as outpatient. ACE inhibitor not started as inpatient secondary to worsening renal function. 5. History of essential hypertension. Blood pressure on the lower side after dialysis. Continue to monitor 6. Anemia of chronic kidney disease. Last hemoglobin 8.3. Procrit with dialysis. 7. Patient is a DO NOT RESUSCITATE  DISCHARGE CONDITIONS:    Fair  CONSULTS OBTAINED:  Treatment Team:  Ramonita LabGouru, Aruna, MD Mady HaagensenLateef, Munsoor, MD  DRUG ALLERGIES:  No Known Allergies  DISCHARGE MEDICATIONS:   Current Discharge Medication List    START taking these medications   Details  acetaminophen (TYLENOL) 325 MG tablet Take 2 tablets (650 mg total) by mouth every 6 (six) hours as needed for mild pain (or Fever >/= 101). Qty: 60 tablet, Refills: 0    bisoprolol (ZEBETA) 5 MG tablet Take 1 tablet (5 mg total) by mouth at bedtime. Qty: 30 tablet, Refills: 0    brimonidine (ALPHAGAN) 0.2 % ophthalmic solution Place 1 drop into both eyes daily as needed (glaucoma). Qty: 5 mL, Refills: 0    brinzolamide (AZOPT) 1 % ophthalmic suspension Place 1 drop into both eyes at bedtime. Qty: 10 mL, Refills: 0    gentamicin cream (GARAMYCIN) 0.1 % Apply 1 application topically daily. Qty: 15 g, Refills: 0    multivitamin (RENA-VIT) TABS tablet Take 1 tablet by mouth at bedtime. Qty: 30 tablet, Refills: 0    Nutritional Supplements (FEEDING SUPPLEMENT, NEPRO CARB STEADY,) LIQD Take 237 mLs by mouth 3 (three) times daily between meals. Qty: 60 Can, Refills: 0      CONTINUE these medications which have NOT CHANGED   Details  amLODipine (NORVASC) 5 MG tablet Take 1 tablet (5 mg total) by mouth daily. Qty: 30 tablet, Refills: 2    aspirin 81 MG tablet Take 81 mg by mouth daily.    diclofenac sodium (VOLTAREN) 1 % GEL Apply 2 g topically 4 (four) times daily. Qty: 100 g, Refills: 0    furosemide (LASIX) 80 MG tablet Take  1 tablet (80 mg total) by mouth daily. Qty: 30 tablet, Refills: 1    levothyroxine (SYNTHROID, LEVOTHROID) 25 MCG tablet Take 25 mcg by mouth daily before breakfast.      STOP taking these medications     SIMBRINZA 1-0.2 % SUSP          DISCHARGE INSTRUCTIONS:   Follow-up with dialysis Tuesday Thursday and Saturday Follow-up with physician at facility  If you experience worsening of your admission symptoms,  develop shortness of breath, life threatening emergency, suicidal or homicidal thoughts you must seek medical attention immediately by calling 911 or calling your MD immediately  if symptoms less severe.  You Must read complete instructions/literature along with all the possible adverse reactions/side effects for all the Medicines you take and that have been prescribed to you. Take any new Medicines after you have completely understood and accept all the possible adverse reactions/side effects.   Please note  You were cared for by a hospitalist during your hospital stay. If you have any questions about your discharge medications or the care you received while you were in the hospital after you are discharged, you can call the unit and asked to speak with the hospitalist on call if the hospitalist that took care of you is not available. Once you are discharged, your primary care physician will handle any further medical issues. Please note that NO REFILLS for any discharge medications will be authorized once you are discharged, as it is imperative that you return to your primary care physician (or establish a relationship with a primary care physician if you do not have one) for your aftercare needs so that they can reassess your need for medications and monitor your lab values.    Today   CHIEF COMPLAINT:   Chief Complaint  Patient presents with  . Failure To Thrive    HISTORY OF PRESENT ILLNESS:  Ann Cunningham  is a 76 y.o. female brought back into the hospital with failure to thrive and her caregiver was unable to take care of her.   VITAL SIGNS:  Blood pressure 131/73, pulse 90, temperature 98.7 F (37.1 C), temperature source Oral, resp. rate 13, height 5\' 5"  (1.651 m), weight 58.7 kg (129 lb 6.6 oz), SpO2 100 %.    PHYSICAL EXAMINATION:  GENERAL:  76 y.o.-year-old patient lying in the bed with no acute distress.  EYES: Pupils equal, round, reactive to light and accommodation. No  scleral icterus. Extraocular muscles intact.  HEENT: Head atraumatic, normocephalic. Oropharynx and nasopharynx clear.  NECK:  Supple, no jugular venous distention. No thyroid enlargement, no tenderness.  LUNGS: Decreased breath sounds bilateral bases, no wheezing, rales,rhonchi or crepitation. No use of accessory muscles of respiration.  CARDIOVASCULAR: S1, S2 normal. No murmurs, rubs, or gallops.  ABDOMEN: Soft, non-tender, non-distended. Bowel sounds present. No organomegaly or mass.  EXTREMITIES: No pedal edema, cyanosis, or clubbing.  NEUROLOGIC: Cranial nerves II through XII are intact. Muscle strength 5/5 in all extremities. Sensation intact. Gait not checked.  PSYCHIATRIC: The patient is alert and oriented x 3.  SKIN: No obvious rash, lesion, or ulcer.   DATA REVIEW:   CBC Recent Labs  Lab 08/22/17 1041  WBC 6.7  HGB 8.3*  HCT 25.9*  PLT 223    Chemistries  Recent Labs  Lab 08/22/17 0408  NA 138  K 3.7  CL 104  CO2 28  GLUCOSE 100*  BUN 24*  CREATININE 2.34*  CALCIUM 7.4*     Microbiology Results  Results for orders placed or performed during the hospital encounter of 08/17/17  Body fluid culture     Status: None (Preliminary result)   Collection Time: 08/20/17 11:08 AM  Result Value Ref Range Status   Specimen Description PLEURAL  Final   Special Requests NONE  Final   Gram Stain   Final    RARE WBC PRESENT, PREDOMINANTLY PMN NO ORGANISMS SEEN    Culture   Final    NO GROWTH 2 DAYS Performed at [facility], [address] [place], [place] [address]    Report Status PENDING  Incomplete  Acid Fast Smear (AFB)     Status: None   Collection Time: 08/20/17 11:08 AM  Result Value Ref Range Status   AFB Specimen Processing Concentration  Final   Acid Fast Smear Negative  Final    Comment: (NOTE) Performed At: [facility] [address] [place], [place] [id number] Jolene SchimkeNagendra Sanjai MD VH:[id number]:[phone]    Source (AFB) PLEURAL  Final       Management plans discussed with the patient, and she is in agreement.  CODE STATUS:     Code Status Orders  (From admission, onward)        Start     Ordered   08/17/17 2122  Do not attempt resuscitation (DNR)  Continuous    Question Answer Comment  In the event of cardiac or respiratory ARREST Do not call a "code blue"   In the event of cardiac or respiratory ARREST Do not perform Intubation, CPR, defibrillation or ACLS   In the event of cardiac or respiratory ARREST Use medication by any route, position, wound care, and other measures to relive pain and suffering. May use oxygen, suction and manual treatment of airway obstruction as needed for comfort.   Comments nurse may pronounce      08/17/17 2121    Code Status History    Date Active Date Inactive Code Status Order ID Comments User Context   08/14/2017 04:59 08/15/2017 19:41 Full Code [id number]  Arnaldo Nataliamond, Michael S, MD Inpatient      TOTAL TIME TAKING CARE OF THIS PATIENT: 38 minutes. Case discussed with Child psychotherapistsocial worker and nephrology. Tried calling the niece but her mailbox was full.   Alford Highlandichard Harmonii Karle M.D on 08/22/2017 at 3:35 PM  Between 7am to 6pm - Pager - [phone]  After 6pm go to [email or web address] - Social research officer, governmentpassword EPAS [email or web address]  [facility] Office  [phone]  CC: Primary care physician; [facility]

## 2017-08-22 NOTE — Progress Notes (Signed)
TX ended w/o issue tolerated well.

## 2017-08-22 NOTE — Progress Notes (Signed)
Severe malnutrition

## 2017-08-22 NOTE — Progress Notes (Signed)
Tx initiated w/o issue, Rn at bedside

## 2017-08-22 NOTE — Progress Notes (Signed)
Pre HD  

## 2017-08-22 NOTE — Progress Notes (Signed)
PT Cancellation Note  Patient Details Name: Ann CoolerWilma Cunningham MRN: [id number] DOB: [birth year]   Cancelled Treatment:    Reason Eval/Treat Not Completed: Patient at procedure or test/unavailable.  Pt currently off floor for dialysis.  Will re-attempt PT treatment at a later date/time.  Hendricks LimesEmily Steffi Noviello, PT 08/22/17, 12:12 PM [phone]

## 2017-08-22 NOTE — Care Management (Addendum)
Per Dimas ChyleAmanda Morris HD liaison outpatient HD schedule confirmed for [facility] at 9:20 am.  Can start 08/23/17.  Patient to discharge today.  Marchelle Folksmanda notified of discharge to SNF today.  CSW facilitating discharge. Elnita Maxwellheryl with [facility] notified of discharge to SNF. RNCM signing off.

## 2017-08-22 NOTE — Progress Notes (Signed)
Post HD, treatment tolerated well, pt remained in recliner, tx completed UF goal achieved, Rn at bedside.

## 2017-08-23 LAB — BODY FLUID CULTURE: CULTURE: NO GROWTH

## 2017-08-23 LAB — CYTOLOGY - NON PAP

## 2017-09-12 ENCOUNTER — Ambulatory Visit (INDEPENDENT_AMBULATORY_CARE_PROVIDER_SITE_OTHER): Payer: Medicare Other | Admitting: Vascular Surgery

## 2017-09-12 ENCOUNTER — Encounter (INDEPENDENT_AMBULATORY_CARE_PROVIDER_SITE_OTHER): Payer: Self-pay | Admitting: Vascular Surgery

## 2017-09-12 VITALS — BP 81/46 | HR 83 | Resp 16 | Ht 62.0 in

## 2017-09-12 DIAGNOSIS — I10 Essential (primary) hypertension: Secondary | ICD-10-CM | POA: Insufficient documentation

## 2017-09-12 DIAGNOSIS — Z992 Dependence on renal dialysis: Secondary | ICD-10-CM

## 2017-09-12 DIAGNOSIS — N186 End stage renal disease: Secondary | ICD-10-CM | POA: Diagnosis not present

## 2017-09-12 NOTE — Progress Notes (Signed)
Subjective:    Patient ID: Ann Cunningham, female    DOB: 28-Jun-1941, 76 y.o.   MRN: [id number] Chief Complaint  Patient presents with  . Follow-up    pd cath removal   Presents at the request of Dr. Wynelle Link for peritoneal dialysis catheter removal and hemodialysis access creation.  The patient is a resident at [facility] and did not present to clinic with an aid today.  The patient has a past medical history of dementia and is a poor historian.  The patient is currently being maintained by a right IJ PermCath which was placed by Dr. Wyn Quaker on August 20, 2017.  This seems to be functioning without issue.  The patient had a peritoneal dialysis catheter placed by Dr. Gilda Crease on August 15, 2017.  The patient states she would like her PD catheter to be removed as she cannot "find a place to dialyze".  Referral paper states "PD catheter to be removed due to the patient change of mind with modality".  The patient is unable to tell me if she ever used her peritoneal dialysis catheter.  There is no information in the referral paperwork if her peritoneal dialysis catheter ever functioned.  The patient denies any uremic symptoms.  The patient is right-handed.  The patient denies any fever, nausea or vomiting.   Review of Systems  Constitutional: Negative.   HENT: Negative.   Eyes: Negative.   Respiratory: Negative.   Cardiovascular: Negative.   Gastrointestinal: Negative.   Endocrine: Negative.   Genitourinary:       ESRD  Musculoskeletal: Negative.   Skin: Negative.   Allergic/Immunologic: Negative.   Neurological: Negative.   Hematological: Negative.   Psychiatric/Behavioral: Negative.       Objective:   Physical Exam  Constitutional: She is oriented to person, place, and time. She appears well-developed and well-nourished. No distress.  HENT:  Head: Normocephalic and atraumatic.  Eyes: Conjunctivae are normal. Pupils are equal, round, and reactive to light.  Neck: Normal  range of motion.  Cardiovascular: Normal rate, regular rhythm, normal heart sounds and intact distal pulses.  Pulses:      Radial pulses are 2+ on the right side, and 2+ on the left side.  Right IJ PermCath: Intact.  No signs of infection. Peritoneal dialysis catheter: Intact.  No signs of infection.  Pulmonary/Chest: Effort normal and breath sounds normal. No respiratory distress. She has no wheezes. She has no rales.  Abdominal: Soft. Bowel sounds are normal. She exhibits no distension. There is no tenderness. There is no rebound.  Musculoskeletal: Normal range of motion. She exhibits edema (Mild bilateral nonpitting edema noted).  Neurological: She is alert and oriented to person, place, and time.  Skin: Skin is warm and dry. She is not diaphoretic.  Psychiatric: She has a normal mood and affect. Her behavior is normal. Judgment and thought content normal.  Vitals reviewed.  BP (!) 81/46 (BP Location: Right Arm)   Pulse 83   Resp 16   Ht 5\' 2"  (1.575 m)   BMI 23.67 kg/m   Past Medical History:  Diagnosis Date  . CHF (congestive heart failure) (HCC)   . CKD (chronic kidney disease)   . Hypertension   . Hypothyroidism   . Renal disorder   . Uses walker    Social History   Socioeconomic History  . Marital status: Single    Spouse name: Not on file  . Number of children: Not on file  . Years of education:  Not on file  . Highest education level: Not on file  Social Needs  . Financial resource strain: Patient refused  . Food insecurity - worry: Patient refused  . Food insecurity - inability: Patient refused  . Transportation needs - medical: Patient refused  . Transportation needs - non-medical: Patient refused  Occupational History  . Not on file  Tobacco Use  . Smoking status: Former Smoker    Packs/day: 0.50    Types: Cigarettes    Last attempt to quit: 06/14/2017    Years since quitting: 0.2  . Smokeless tobacco: Current User    Types: Snuff  Substance and Sexual  Activity  . Alcohol use: No  . Drug use: No  . Sexual activity: Not on file  Other Topics Concern  . Not on file  Social History Narrative  . Not on file   Past Surgical History:  Procedure Laterality Date  . CAPD INSERTION N/A 08/15/2017   Procedure: LAPAROSCOPIC INSERTION CONTINUOUS AMBULATORY PERITONEAL DIALYSIS  (CAPD) CATHETER;  Surgeon: Renford DillsSchnier, Gregory G, MD;  Location: [facility] ORS;  Service: Vascular;  Laterality: N/A;  . CATARACT EXTRACTION W/ INTRAOCULAR LENS IMPLANT    . CATARACT EXTRACTION W/PHACO Right 12/25/2016   Procedure: CATARACT EXTRACTION PHACO AND INTRAOCULAR LENS PLACEMENT (IOC)  Right complicated;  Surgeon: Sherald HessAnita Prakash Vin-Parikh, MD;  [facility];  Service: Ophthalmology;  Laterality: Right;  Vision Blue  . DIALYSIS/PERMA CATHETER INSERTION N/A 08/20/2017   Procedure: DIALYSIS/PERMA CATHETER INSERTION;  Surgeon: Annice Needyew, Jason S, MD;  Location: [facility] INVASIVE CV LAB;  Service: Cardiovascular;  Laterality: N/A;   Family History  Problem Relation Age of Onset  . Hypertension Mother    No Known Allergies     Assessment & Plan:  Presents at the request of Dr. Wynelle LinkKolluru for peritoneal dialysis catheter removal and hemodialysis access creation.  The patient is a resident at [facility] and did not present to clinic with an aid today.  The patient has a past medical history of dementia and is a poor historian.  The patient is currently being maintained by a right IJ PermCath which was placed by Dr. Wyn Quakerew on August 20, 2017.  This seems to be functioning without issue.  The patient had a peritoneal dialysis catheter placed by Dr. Gilda CreaseSchnier on August 15, 2017.  The patient states she would like her PD catheter to be removed as she cannot "find a place to dialyze".  Referral paper states "PD catheter to be removed due to the patient change of mind with modality".  The patient is unable to tell me if she ever used her peritoneal dialysis catheter.   There is no information in the referral paperwork if her peritoneal dialysis catheter ever functioned.  The patient denies any uremic symptoms.  The patient is right-handed.  The patient denies any fever, nausea or vomiting.  1. ESRD on dialysis (HCC) - Stable Patient is currently being maintained by a right IJ PermCath without issue The patient/dialysis center/Dr. Wynelle LinkKolluru would like to have her peritoneal dialysis catheter removed I will bring the patient back ASAP to undergo a bilateral upper extremity vein mapping to assess anatomy in preparation for an AVS versus graft creation. The patient is right-hand dominant The patient to continue dialyzing through her right IJ PermCath The patient understands that we will remove her peritoneal dialysis catheter and place her hemodialysis access at the same time Patient is in agreement  - VAS US UPPER EXTREMITY VEIN MAPPING; Future  2.  Essential hypertension - Stable Encouraged good control as its slows the progression of atherosclerotic disease  Current Outpatient Medications on File Prior to Visit  Medication Sig Dispense Refill  . acetaminophen (TYLENOL) 325 MG tablet Take 2 tablets (650 mg total) by mouth every 6 (six) hours as needed for mild pain (or Fever >/= 101). 60 tablet 0  . amLODipine (NORVASC) 5 MG tablet Take 1 tablet (5 mg total) by mouth daily. 30 tablet 2  . aspirin 81 MG tablet Take 81 mg by mouth daily.    . bisoprolol (ZEBETA) 5 MG tablet Take 1 tablet (5 mg total) by mouth at bedtime. 30 tablet 0  . brimonidine (ALPHAGAN) 0.2 % ophthalmic solution Place 1 drop into both eyes daily as needed (glaucoma). 5 mL 0  . brinzolamide (AZOPT) 1 % ophthalmic suspension Place 1 drop into both eyes at bedtime. 10 mL 0  . diclofenac sodium (VOLTAREN) 1 % GEL Apply 2 g topically 4 (four) times daily. 100 g 0  . furosemide (LASIX) 80 MG tablet Take 1 tablet (80 mg total) by mouth daily. 30 tablet 1  . gentamicin cream (GARAMYCIN) 0.1 % Apply  1 application topically daily. 15 g 0  . levothyroxine (SYNTHROID, LEVOTHROID) 25 MCG tablet Take 25 mcg by mouth daily before breakfast.    . multivitamin (RENA-VIT) TABS tablet Take 1 tablet by mouth at bedtime. 30 tablet 0  . Nutritional Supplements (FEEDING SUPPLEMENT, NEPRO CARB STEADY,) LIQD Take 237 mLs by mouth 3 (three) times daily between meals. 60 Can 0   No current facility-administered medications on file prior to visit.    There are no Patient Instructions on file for this visit. No Follow-up on file.  Olamide Lahaie A Mikhaila Roh, PA-C

## 2017-09-14 ENCOUNTER — Ambulatory Visit (INDEPENDENT_AMBULATORY_CARE_PROVIDER_SITE_OTHER): Payer: Medicare Other

## 2017-09-14 DIAGNOSIS — N186 End stage renal disease: Secondary | ICD-10-CM | POA: Diagnosis not present

## 2017-09-14 DIAGNOSIS — Z992 Dependence on renal dialysis: Secondary | ICD-10-CM | POA: Diagnosis not present

## 2017-09-19 ENCOUNTER — Ambulatory Visit (INDEPENDENT_AMBULATORY_CARE_PROVIDER_SITE_OTHER): Payer: Medicare Other | Admitting: Vascular Surgery

## 2017-09-19 ENCOUNTER — Encounter (INDEPENDENT_AMBULATORY_CARE_PROVIDER_SITE_OTHER): Payer: Self-pay | Admitting: Vascular Surgery

## 2017-09-19 VITALS — BP 92/54 | HR 95 | Resp 16

## 2017-09-19 DIAGNOSIS — I10 Essential (primary) hypertension: Secondary | ICD-10-CM

## 2017-09-19 DIAGNOSIS — N186 End stage renal disease: Secondary | ICD-10-CM

## 2017-09-19 DIAGNOSIS — Z992 Dependence on renal dialysis: Secondary | ICD-10-CM | POA: Diagnosis not present

## 2017-09-19 NOTE — Progress Notes (Signed)
Subjective:    Patient ID: Ann Cunningham, female    DOB: Apr 25, 1941, 76 y.o.   MRN: [id number] Chief Complaint  Patient presents with  . Follow-up    review vein mapp done 21st   Patient presents to review vascular studies.  The patient was seen approximately 1 week ago for evaluation of peritoneal dialysis catheter removal and creation of hemodialysis access.  The patient is currently being maintained by a right IJ PermCath without issue the patient underwent bilateral upper extremity vein mapping which was amenable for creation of a right basilic vein transposition.  The patient again was not accompanied by an aid from her residence.  The patient has a past medical history of dementia and is a poor historian.  The patient is right-handed however does not seem to mind the creation of a dialysis access to the right upper extremity.  We had a long discussion about this being a two-part procedure.  We will plan on stage one right basilic transposition with removal of her peritoneal dialysis catheter.  She understands she will return a few weeks later to undergo the second stage of the transposition.    Review of Systems  Constitutional: Negative.   HENT: Negative.   Eyes: Negative.   Respiratory: Negative.   Cardiovascular: Negative.   Gastrointestinal: Negative.   Endocrine: Negative.   Genitourinary: Negative.   Musculoskeletal: Negative.   Skin: Negative.   Allergic/Immunologic: Negative.   Neurological: Negative.   Hematological: Negative.   Psychiatric/Behavioral: Negative.       Objective:   Physical Exam  Vitals reviewed.  BP (!) 92/54 (BP Location: Right Arm)   Pulse 95   Resp 16    This note the physical exam has not changed when compared to less than a week ago.  Constitutional: She is oriented to person, place, and time. She appears well-developed and well-nourished. No distress.  HENT:  Head: Normocephalic and atraumatic.  Eyes: Conjunctivae are normal. Pupils are  equal, round, and reactive to light.  Neck: Normal range of motion.  Cardiovascular: Normal rate, regular rhythm, normal heart sounds and intact distal pulses.  Pulses:      Radial pulses are 2+ on the right side, and 2+ on the left side.  Right IJ PermCath: Intact.  No signs of infection. Peritoneal dialysis catheter: Intact.  No signs of infection.  Pulmonary/Chest: Effort normal and breath sounds normal. No respiratory distress. She has no wheezes. She has no rales.  Abdominal: Soft. Bowel sounds are normal. She exhibits no distension. There is no tenderness. There is no rebound.  Musculoskeletal: Normal range of motion. She exhibits edema (Mild bilateral nonpitting edema noted).  Neurological: She is alert and oriented to person, place, and time.  Skin: Skin is warm and dry. She is not diaphoretic.  Psychiatric: She has a normal mood and affect. Her behavior is normal. Judgment and thought content normal.   Past Medical History:  Diagnosis Date  . CHF (congestive heart failure) (HCC)   . CKD (chronic kidney disease)   . Hypertension   . Hypothyroidism   . Renal disorder   . Uses walker    Social History   Socioeconomic History  . Marital status: Single    Spouse name: Not on file  . Number of children: Not on file  . Years of education: Not on file  . Highest education level: Not on file  Social Needs  . Financial resource strain: Patient refused  . Food insecurity - worry: Patient  refused  . Food insecurity - inability: Patient refused  . Transportation needs - medical: Patient refused  . Transportation needs - non-medical: Patient refused  Occupational History  . Not on file  Tobacco Use  . Smoking status: Former Smoker    Packs/day: 0.50    Types: Cigarettes    Last attempt to quit: 06/14/2017    Years since quitting: 0.2  . Smokeless tobacco: Current User    Types: Snuff  Substance and Sexual Activity  . Alcohol use: No  . Drug use: No  . Sexual activity: Not  on file  Other Topics Concern  . Not on file  Social History Narrative  . Not on file   Past Surgical History:  Procedure Laterality Date  . CAPD INSERTION N/A 08/15/2017   Procedure: LAPAROSCOPIC INSERTION CONTINUOUS AMBULATORY PERITONEAL DIALYSIS  (CAPD) CATHETER;  Surgeon: Renford DillsSchnier, Gregory G, MD;  Location: [facility] ORS;  Service: Vascular;  Laterality: N/A;  . CATARACT EXTRACTION W/ INTRAOCULAR LENS IMPLANT    . CATARACT EXTRACTION W/PHACO Right 12/25/2016   Procedure: CATARACT EXTRACTION PHACO AND INTRAOCULAR LENS PLACEMENT (IOC)  Right complicated;  Surgeon: Sherald HessAnita Prakash Vin-Parikh, MD;  [facility];  Service: Ophthalmology;  Laterality: Right;  Vision Blue  . DIALYSIS/PERMA CATHETER INSERTION N/A 08/20/2017   Procedure: DIALYSIS/PERMA CATHETER INSERTION;  Surgeon: Annice Needyew, Jason S, MD;  Location: [facility] INVASIVE CV LAB;  Service: Cardiovascular;  Laterality: N/A;   Family History  Problem Relation Age of Onset  . Hypertension Mother    Allergies  Allergen Reactions  . Penicillins     Other reaction(s): OTHER      Assessment & Plan:  Patient presents to review vascular studies.  The patient was seen approximately 1 week ago for evaluation of peritoneal dialysis catheter removal and creation of hemodialysis access.  The patient is currently being maintained by a right IJ PermCath without issue the patient underwent bilateral upper extremity vein mapping which was amenable for creation of a right basilic vein transposition.  The patient again was not accompanied by an aid from her residence.  The patient has a past medical history of dementia and is a poor historian.  The patient is right-handed however does not seem to mind the creation of a dialysis access to the right upper extremity.  We had a long discussion about this being a two-part procedure.  We will plan on stage one right basilic transposition with removal of her peritoneal dialysis catheter.  She understands she  will return a few weeks later to undergo the second stage of the transposition.   1. ESRD on dialysis (HCC) - Stable Patient is currently being maintained by a right IJ PermCath without issue The patient continues to want her peritoneal dialysis catheter removed Bilateral upper extremity vein mapping was amenable for creation of a right basilic transposition I will schedule the patient for stage I basilic transposition with removal of her peritoneal dialysis catheter The patient will need to undergo staged to few weeks after that Procedure, risks and benefits explained to the patient All questions answered Patient would like to proceed This time, the patient's residents will call our office to schedule a date and time to undergo the procedure as per Vernona RiegerLaura our procedure coordinator. This was written in her progress note she was sent back to her residence  2. Essential hypertension - Stable Encouraged good control as its slows the progression of atherosclerotic disease  Current Outpatient Medications on File Prior to Visit  Medication Sig  Dispense Refill  . acetaminophen (TYLENOL) 325 MG tablet Take 2 tablets (650 mg total) by mouth every 6 (six) hours as needed for mild pain (or Fever >/= 101). 60 tablet 0  . amLODipine (NORVASC) 5 MG tablet Take 1 tablet (5 mg total) by mouth daily. 30 tablet 2  . aspirin 81 MG tablet Take 81 mg by mouth daily.    . bisoprolol (ZEBETA) 5 MG tablet Take 1 tablet (5 mg total) by mouth at bedtime. 30 tablet 0  . brimonidine (ALPHAGAN) 0.2 % ophthalmic solution Place 1 drop into both eyes daily as needed (glaucoma). 5 mL 0  . brinzolamide (AZOPT) 1 % ophthalmic suspension Place 1 drop into both eyes at bedtime. 10 mL 0  . diclofenac sodium (VOLTAREN) 1 % GEL Apply 2 g topically 4 (four) times daily. 100 g 0  . furosemide (LASIX) 80 MG tablet Take 1 tablet (80 mg total) by mouth daily. 30 tablet 1  . gentamicin cream (GARAMYCIN) 0.1 % Apply 1 application  topically daily. 15 g 0  . levothyroxine (SYNTHROID, LEVOTHROID) 25 MCG tablet Take 25 mcg by mouth daily before breakfast.    . multivitamin (RENA-VIT) TABS tablet Take 1 tablet by mouth at bedtime. 30 tablet 0  . Nutritional Supplements (FEEDING SUPPLEMENT, NEPRO CARB STEADY,) LIQD Take 237 mLs by mouth 3 (three) times daily between meals. 60 Can 0   No current facility-administered medications on file prior to visit.    There are no Patient Instructions on file for this visit. No Follow-up on file.  Philomena Buttermore A Zailyn Thoennes, PA-C

## 2017-09-20 ENCOUNTER — Telehealth (INDEPENDENT_AMBULATORY_CARE_PROVIDER_SITE_OTHER): Payer: Self-pay | Admitting: Vascular Surgery

## 2017-09-20 NOTE — Telephone Encounter (Signed)
Please refer to my last two notes in Epic. Thank you.

## 2017-09-20 NOTE — Telephone Encounter (Signed)
Faxed over the last two office visit notes, and wrote at the bottom that the patient's home, [facility] must call over to setup the PD cath removal because the patient can't speak for herself to set up this procedure and because no one has accompanied her to our office on the last two visits.

## 2017-09-20 NOTE — Telephone Encounter (Signed)
New Message  Ann Cunningham verbalized wanting to know as to why her PD Cath wasn't removed and needs to speak to nurse or MD.  Please f/u with Ann FolksAmanda

## 2017-10-03 LAB — ACID FAST CULTURE WITH REFLEXED SENSITIVITIES (MYCOBACTERIA): Acid Fast Culture: NEGATIVE

## 2017-10-08 ENCOUNTER — Other Ambulatory Visit (INDEPENDENT_AMBULATORY_CARE_PROVIDER_SITE_OTHER): Payer: Self-pay | Admitting: Vascular Surgery

## 2017-10-12 ENCOUNTER — Encounter (INDEPENDENT_AMBULATORY_CARE_PROVIDER_SITE_OTHER): Payer: Self-pay

## 2017-10-12 ENCOUNTER — Inpatient Hospital Stay: Admission: RE | Admit: 2017-10-12 | Payer: Medicare Other | Source: Ambulatory Visit

## 2017-10-15 ENCOUNTER — Encounter
Admission: RE | Admit: 2017-10-15 | Discharge: 2017-10-15 | Disposition: A | Discharge status: Home / Self Care | Payer: Medicare Other | Source: Ambulatory Visit | Attending: Vascular Surgery | Admitting: Vascular Surgery

## 2017-10-15 ENCOUNTER — Other Ambulatory Visit: Payer: Self-pay

## 2017-10-15 DIAGNOSIS — Z992 Dependence on renal dialysis: Secondary | ICD-10-CM | POA: Diagnosis not present

## 2017-10-15 DIAGNOSIS — Z79899 Other long term (current) drug therapy: Secondary | ICD-10-CM | POA: Insufficient documentation

## 2017-10-15 DIAGNOSIS — N186 End stage renal disease: Secondary | ICD-10-CM | POA: Diagnosis not present

## 2017-10-15 DIAGNOSIS — Z0183 Encounter for blood typing: Secondary | ICD-10-CM | POA: Insufficient documentation

## 2017-10-15 DIAGNOSIS — Z01812 Encounter for preprocedural laboratory examination: Secondary | ICD-10-CM | POA: Diagnosis not present

## 2017-10-15 DIAGNOSIS — I120 Hypertensive chronic kidney disease with stage 5 chronic kidney disease or end stage renal disease: Secondary | ICD-10-CM | POA: Diagnosis not present

## 2017-10-15 HISTORY — DX: Unspecified complication of cardiac and vascular prosthetic device, implant and graft, initial encounter: T82.9XXA

## 2017-10-15 HISTORY — DX: Anemia, unspecified: D64.9

## 2017-10-15 HISTORY — DX: Cardiomyopathy, unspecified: I42.9

## 2017-10-15 HISTORY — DX: Unspecified glaucoma: H40.9

## 2017-10-15 HISTORY — DX: Pressure ulcer of sacral region, unspecified stage: L89.159

## 2017-10-15 LAB — CBC WITH DIFFERENTIAL/PLATELET
BASOS ABS: 0.1 10*3/uL (ref 0–0.1)
Basophils Relative: 1 %
EOS PCT: 3 %
Eosinophils Absolute: 0.2 10*3/uL (ref 0–0.7)
HCT: 31.3 % — ABNORMAL LOW (ref 35.0–47.0)
HEMOGLOBIN: 10.2 g/dL — AB (ref 12.0–16.0)
LYMPHS PCT: 18 %
Lymphs Abs: 1.2 10*3/uL (ref 1.0–3.6)
MCH: 28.4 pg (ref 26.0–34.0)
MCHC: 32.4 g/dL (ref 32.0–36.0)
MCV: 87.7 fL (ref 80.0–100.0)
Monocytes Absolute: 0.7 10*3/uL (ref 0.2–0.9)
Monocytes Relative: 12 %
NEUTROS PCT: 66 %
Neutro Abs: 4.3 10*3/uL (ref 1.4–6.5)
PLATELETS: 382 10*3/uL (ref 150–440)
RBC: 3.57 MIL/uL — AB (ref 3.80–5.20)
RDW: 15.8 % — ABNORMAL HIGH (ref 11.5–14.5)
WBC: 6.4 10*3/uL (ref 3.6–11.0)

## 2017-10-15 LAB — BASIC METABOLIC PANEL
Anion gap: 11 (ref 5–15)
BUN: 32 mg/dL — AB (ref 6–20)
CO2: 23 mmol/L (ref 22–32)
Calcium: 8.6 mg/dL — ABNORMAL LOW (ref 8.9–10.3)
Chloride: 97 mmol/L — ABNORMAL LOW (ref 101–111)
Creatinine, Ser: 2.69 mg/dL — ABNORMAL HIGH (ref 0.44–1.00)
GFR, EST AFRICAN AMERICAN: 19 mL/min — AB (ref >60)
GFR, EST NON AFRICAN AMERICAN: 16 mL/min — AB (ref >60)
Glucose, Bld: 109 mg/dL — ABNORMAL HIGH (ref 65–99)
POTASSIUM: 3.1 mmol/L — AB (ref 3.5–5.1)
SODIUM: 131 mmol/L — AB (ref 135–145)

## 2017-10-15 LAB — PROTIME-INR
INR: 1.02
PROTHROMBIN TIME: 13.3 s (ref 11.4–15.2)

## 2017-10-15 LAB — TYPE AND SCREEN
ABO/RH(D): O POS
ANTIBODY SCREEN: NEGATIVE

## 2017-10-15 LAB — APTT: APTT: 32 s (ref 24–36)

## 2017-10-15 NOTE — Pre-Procedure Instructions (Signed)
CBC and Met B faxed to Dr. Nino Parsley office.

## 2017-10-15 NOTE — Pre-Procedure Instructions (Signed)
See EKG in Epic done on 08/14/2018.

## 2017-10-15 NOTE — Patient Instructions (Signed)
  Your procedure is scheduled ZO:XWRUEAon:Friday Jan. 25, th , 2019. Report to Same Day Surgery. To find out your arrival time please call [phone] between 1PM - 3PM on Thursday Jan. 24th, 2019 .  Remember: Instructions that are not followed completely may result in serious medical risk, up to and including death, or upon the discretion of your surgeon and anesthesiologist your surgery may need to be rescheduled.    _x___ 1. Do not eat food after midnight night prior to surgery.     No gum chewing or hard candies, snacks or breakfast.     May drink the following, stop drinking liquids 2 hours prior to arrival to hospital:      water      Gatorade     clear apple juice      black coffee      or black tea      ____ 2. No Alcohol for 24 hours before or after surgery.   ____ 3. Bring all medications with you on the day of surgery if instructed.    __x__ 4. Notify your doctor if there is any change in your medical condition     (cold, fever, infections).    _____ 5.   Do Not Smoke or use e-cigarettes For 24 Hours Prior to Your   Surgery.  Do not use any chewable tobacco products for at least 6   hours prior to  surgery.                  Do not wear jewelry, make-up, hairpins, clips or nail polish.  Do not wear lotions, powders, or perfumes.   Do not shave 48 hours prior to surgery. Men may shave face and neck.  Do not bring valuables to the hospital.    [facility] is not responsible for any belongings or valuables.               Contacts, dentures or bridgework may not be worn into surgery.  Leave your suitcase in the car. After surgery it may be brought to your room.  For patients admitted to the hospital, discharge time is determined by your  treatment team.   Patients discharged the day of surgery will not be allowed to drive home.    Please read over the following fact sheets that you were given:   [facility]             __x__ Take these  medicines the morning of surgery with A SIP OF WATER:    1. Synthroid/Levothyroxine      ____ Fleet Enema (as directed)   __x__ Use Sage Wipes as directed on instruction sheet  ____ Use inhalers on the day of surgery and bring to hospital day of surgery  ____ Stop metformin 2 days prior to surgery    ____ Take 1/2 of usual insulin dose the night before surgery and none on the morning of          surgery.   _x___ Continue aspirin.  _x___ Stop Anti-inflammatories such as Advil, Aleve, Ibuprofen, Motrin, Naproxen,  Naprosyn, Goodies powders or aspirin products. OK to take Tylenol.   ____ Stop supplements until after surgery.    ____ Bring C-Pap to the hospital.

## 2017-10-15 NOTE — Pre-Procedure Instructions (Signed)
Spoke with Ann GaribaldiJenella Davis LPN  at [facility] to give pre-op instructions on Ann CoolerWilma Cunningham and arrival time of 06:00 am the day of surgery.  Written copy of pre-op instructions are placed into pt's back pack.

## 2017-10-18 MED ORDER — VANCOMYCIN HCL IN DEXTROSE 1-5 GM/200ML-% IV SOLN
1000.0000 mg | INTRAVENOUS | Status: AC
  Administered 2017-10-19: 1000 mg via INTRAVENOUS

## 2017-10-19 ENCOUNTER — Other Ambulatory Visit: Payer: Self-pay

## 2017-10-19 ENCOUNTER — Ambulatory Visit: Payer: Medicare Other | Admitting: Anesthesiology

## 2017-10-19 ENCOUNTER — Encounter: Payer: Self-pay | Admitting: *Deleted

## 2017-10-19 ENCOUNTER — Encounter
Admission: AD | Disposition: A | Discharge status: Skilled Nursing Facility | Payer: Self-pay | Source: Ambulatory Visit | Attending: Internal Medicine

## 2017-10-19 ENCOUNTER — Inpatient Hospital Stay
Admission: AD | Admit: 2017-10-19 | Discharge: 2017-10-20 | DRG: 981 | Disposition: A | Discharge status: Skilled Nursing Facility | Payer: Medicare Other | Source: Ambulatory Visit | Attending: Internal Medicine | Admitting: Internal Medicine

## 2017-10-19 DIAGNOSIS — E039 Hypothyroidism, unspecified: Secondary | ICD-10-CM | POA: Diagnosis present

## 2017-10-19 DIAGNOSIS — N2581 Secondary hyperparathyroidism of renal origin: Secondary | ICD-10-CM | POA: Diagnosis not present

## 2017-10-19 DIAGNOSIS — I429 Cardiomyopathy, unspecified: Secondary | ICD-10-CM | POA: Diagnosis present

## 2017-10-19 DIAGNOSIS — Z8249 Family history of ischemic heart disease and other diseases of the circulatory system: Secondary | ICD-10-CM

## 2017-10-19 DIAGNOSIS — T4145XA Adverse effect of unspecified anesthetic, initial encounter: Secondary | ICD-10-CM | POA: Diagnosis present

## 2017-10-19 DIAGNOSIS — G92 Toxic encephalopathy: Secondary | ICD-10-CM | POA: Diagnosis not present

## 2017-10-19 DIAGNOSIS — Z87891 Personal history of nicotine dependence: Secondary | ICD-10-CM

## 2017-10-19 DIAGNOSIS — D631 Anemia in chronic kidney disease: Secondary | ICD-10-CM | POA: Diagnosis present

## 2017-10-19 DIAGNOSIS — Z66 Do not resuscitate: Secondary | ICD-10-CM | POA: Diagnosis present

## 2017-10-19 DIAGNOSIS — E872 Acidosis: Secondary | ICD-10-CM | POA: Diagnosis not present

## 2017-10-19 DIAGNOSIS — I5042 Chronic combined systolic (congestive) and diastolic (congestive) heart failure: Secondary | ICD-10-CM | POA: Diagnosis not present

## 2017-10-19 DIAGNOSIS — N186 End stage renal disease: Secondary | ICD-10-CM | POA: Diagnosis present

## 2017-10-19 DIAGNOSIS — I132 Hypertensive heart and chronic kidney disease with heart failure and with stage 5 chronic kidney disease, or end stage renal disease: Secondary | ICD-10-CM | POA: Diagnosis present

## 2017-10-19 DIAGNOSIS — Z7982 Long term (current) use of aspirin: Secondary | ICD-10-CM | POA: Diagnosis not present

## 2017-10-19 DIAGNOSIS — G9340 Encephalopathy, unspecified: Secondary | ICD-10-CM | POA: Diagnosis present

## 2017-10-19 DIAGNOSIS — H409 Unspecified glaucoma: Secondary | ICD-10-CM | POA: Diagnosis not present

## 2017-10-19 DIAGNOSIS — Z79890 Hormone replacement therapy (postmenopausal): Secondary | ICD-10-CM

## 2017-10-19 DIAGNOSIS — R0902 Hypoxemia: Secondary | ICD-10-CM | POA: Diagnosis present

## 2017-10-19 DIAGNOSIS — E43 Unspecified severe protein-calorie malnutrition: Secondary | ICD-10-CM

## 2017-10-19 HISTORY — PX: REMOVAL OF A DIALYSIS CATHETER: SHX6053

## 2017-10-19 HISTORY — PX: BASCILIC VEIN TRANSPOSITION: SHX5742

## 2017-10-19 LAB — RENAL FUNCTION PANEL
ALBUMIN: 2.9 g/dL — AB (ref 3.5–5.0)
ANION GAP: 8 (ref 5–15)
BUN: 30 mg/dL — AB (ref 6–20)
CO2: 31 mmol/L (ref 22–32)
Calcium: 8.1 mg/dL — ABNORMAL LOW (ref 8.9–10.3)
Chloride: 97 mmol/L — ABNORMAL LOW (ref 101–111)
Creatinine, Ser: 1.76 mg/dL — ABNORMAL HIGH (ref 0.44–1.00)
GFR calc Af Amer: 31 mL/min — ABNORMAL LOW (ref >60)
GFR calc non Af Amer: 27 mL/min — ABNORMAL LOW (ref >60)
GLUCOSE: 126 mg/dL — AB (ref 65–99)
PHOSPHORUS: 2.4 mg/dL — AB (ref 2.5–4.6)
POTASSIUM: 2.7 mmol/L — AB (ref 3.5–5.1)
Sodium: 136 mmol/L (ref 135–145)

## 2017-10-19 LAB — CREATININE, SERUM
Creatinine, Ser: 1.78 mg/dL — ABNORMAL HIGH (ref 0.44–1.00)
GFR calc Af Amer: 31 mL/min — ABNORMAL LOW (ref >60)
GFR calc non Af Amer: 27 mL/min — ABNORMAL LOW (ref >60)

## 2017-10-19 LAB — CBC
HEMATOCRIT: 29.1 % — AB (ref 35.0–47.0)
HEMOGLOBIN: 9.4 g/dL — AB (ref 12.0–16.0)
MCH: 28.6 pg (ref 26.0–34.0)
MCHC: 32.4 g/dL (ref 32.0–36.0)
MCV: 88.2 fL (ref 80.0–100.0)
Platelets: 396 10*3/uL (ref 150–440)
RBC: 3.3 MIL/uL — ABNORMAL LOW (ref 3.80–5.20)
RDW: 16 % — AB (ref 11.5–14.5)
WBC: 9.7 10*3/uL (ref 3.6–11.0)

## 2017-10-19 LAB — MAGNESIUM
Magnesium: 1.7 mg/dL (ref 1.7–2.4)
Magnesium: 2.1 mg/dL (ref 1.7–2.4)

## 2017-10-19 LAB — POCT I-STAT 4, (NA,K, GLUC, HGB,HCT)
Glucose, Bld: 94 mg/dL (ref 65–99)
HEMATOCRIT: 30 % — AB (ref 36.0–46.0)
HEMOGLOBIN: 10.2 g/dL — AB (ref 12.0–15.0)
Potassium: 3.9 mmol/L (ref 3.5–5.1)
SODIUM: 138 mmol/L (ref 135–145)

## 2017-10-19 LAB — BLOOD GAS, ARTERIAL
Acid-Base Excess: 4.9 mmol/L — ABNORMAL HIGH (ref 0.0–2.0)
Bicarbonate: 30.8 mmol/L — ABNORMAL HIGH (ref 20.0–28.0)
FIO2: 0.35
O2 Saturation: 94.6 %
PATIENT TEMPERATURE: 37
pCO2 arterial: 52 mmHg — ABNORMAL HIGH (ref 32.0–48.0)
pH, Arterial: 7.38 (ref 7.350–7.450)
pO2, Arterial: 75 mmHg — ABNORMAL LOW (ref 83.0–108.0)

## 2017-10-19 LAB — MRSA PCR SCREENING: MRSA BY PCR: NEGATIVE

## 2017-10-19 LAB — ABO/RH: ABO/RH(D): O POS

## 2017-10-19 LAB — POTASSIUM: POTASSIUM: 4.1 mmol/L (ref 3.5–5.1)

## 2017-10-19 SURGERY — TRANSPOSITION, VEIN, BASILIC
Anesthesia: General | Laterality: Right | Wound class: Clean

## 2017-10-19 MED ORDER — ACETAMINOPHEN 325 MG PO TABS
650.0000 mg | ORAL_TABLET | Freq: Four times a day (QID) | ORAL | Status: DC | PRN
  Filled 2017-10-19: qty 2

## 2017-10-19 MED ORDER — MORPHINE SULFATE (PF) 2 MG/ML IV SOLN: 2.0000 mg | INTRAVENOUS | Status: DC | PRN

## 2017-10-19 MED ORDER — LIDOCAINE HCL (CARDIAC) 20 MG/ML IV SOLN
INTRAVENOUS | Status: DC | PRN
  Administered 2017-10-19: 60 mg via INTRAVENOUS

## 2017-10-19 MED ORDER — NEOSTIGMINE METHYLSULFATE 10 MG/10ML IV SOLN
INTRAVENOUS | Status: AC
  Filled 2017-10-19: qty 1

## 2017-10-19 MED ORDER — EPHEDRINE SULFATE 50 MG/ML IJ SOLN
INTRAMUSCULAR | Status: AC
  Filled 2017-10-19: qty 1

## 2017-10-19 MED ORDER — GLYCOPYRROLATE 0.2 MG/ML IJ SOLN
INTRAMUSCULAR | Status: AC
  Filled 2017-10-19: qty 1

## 2017-10-19 MED ORDER — HEPARIN SODIUM (PORCINE) 5000 UNIT/ML IJ SOLN
5000.0000 [IU] | Freq: Three times a day (TID) | INTRAMUSCULAR | Status: DC
  Administered 2017-10-19 – 2017-10-20 (×3): 5000 [IU] via SUBCUTANEOUS
  Filled 2017-10-19 (×3): qty 1

## 2017-10-19 MED ORDER — ASPIRIN 81 MG PO CHEW
81.0000 mg | CHEWABLE_TABLET | Freq: Every day | ORAL | Status: DC
  Administered 2017-10-19 – 2017-10-20 (×2): 81 mg via ORAL
  Filled 2017-10-19 (×2): qty 1

## 2017-10-19 MED ORDER — CHLORHEXIDINE GLUCONATE CLOTH 2 % EX PADS: 6.0000 | MEDICATED_PAD | Freq: Once | CUTANEOUS | Status: DC

## 2017-10-19 MED ORDER — RENA-VITE PO TABS
1.0000 | ORAL_TABLET | Freq: Every day | ORAL | Status: DC
Start: 2017-10-19 — End: 2017-10-20
  Administered 2017-10-19: 1 via ORAL
  Filled 2017-10-19: qty 1

## 2017-10-19 MED ORDER — NEPRO/CARBSTEADY PO LIQD
237.0000 mL | Freq: Two times a day (BID) | ORAL | Status: DC
  Administered 2017-10-19: 237 mL via ORAL

## 2017-10-19 MED ORDER — FUROSEMIDE 40 MG PO TABS
80.0000 mg | ORAL_TABLET | Freq: Every day | ORAL | Status: DC
  Administered 2017-10-19 – 2017-10-20 (×2): 80 mg via ORAL
  Filled 2017-10-19 (×2): qty 2

## 2017-10-19 MED ORDER — BRINZOLAMIDE 1 % OP SUSP
1.0000 [drp] | Freq: Every day | OPHTHALMIC | Status: DC
  Administered 2017-10-19: 1 [drp] via OPHTHALMIC
  Filled 2017-10-19: qty 10

## 2017-10-19 MED ORDER — ONDANSETRON HCL 4 MG/2ML IJ SOLN
INTRAMUSCULAR | Status: AC
  Filled 2017-10-19: qty 2

## 2017-10-19 MED ORDER — BUPIVACAINE HCL (PF) 0.5 % IJ SOLN
INTRAMUSCULAR | Status: AC
  Filled 2017-10-19: qty 30

## 2017-10-19 MED ORDER — ORAL CARE MOUTH RINSE
15.0000 mL | Freq: Two times a day (BID) | OROMUCOSAL | Status: DC
  Administered 2017-10-19 (×2): 15 mL via OROMUCOSAL

## 2017-10-19 MED ORDER — PROMETHAZINE HCL 25 MG/ML IJ SOLN: 6.2500 mg | INTRAMUSCULAR | Status: DC | PRN

## 2017-10-19 MED ORDER — BISOPROLOL FUMARATE 5 MG PO TABS
5.0000 mg | ORAL_TABLET | Freq: Every day | ORAL | Status: DC
  Filled 2017-10-19 (×2): qty 1

## 2017-10-19 MED ORDER — HEPARIN SODIUM (PORCINE) 5000 UNIT/ML IJ SOLN
INTRAMUSCULAR | Status: AC
  Filled 2017-10-19: qty 1

## 2017-10-19 MED ORDER — BACITRACIN ZINC 500 UNIT/GM EX OINT
TOPICAL_OINTMENT | CUTANEOUS | Status: AC
  Filled 2017-10-19: qty 28.35

## 2017-10-19 MED ORDER — OXYCODONE-ACETAMINOPHEN 5-325 MG PO TABS: 1.0000 | ORAL_TABLET | Freq: Four times a day (QID) | ORAL | Status: DC | PRN

## 2017-10-19 MED ORDER — ONDANSETRON HCL 4 MG PO TABS: 4.0000 mg | ORAL_TABLET | Freq: Four times a day (QID) | ORAL | Status: DC | PRN

## 2017-10-19 MED ORDER — SODIUM CHLORIDE FLUSH 0.9 % IV SOLN
INTRAVENOUS | Status: AC
  Administered 2017-10-19: 13:00:00
  Filled 2017-10-19: qty 10

## 2017-10-19 MED ORDER — ONDANSETRON HCL 4 MG/2ML IJ SOLN: 4.0000 mg | Freq: Four times a day (QID) | INTRAMUSCULAR | Status: DC | PRN

## 2017-10-19 MED ORDER — FAMOTIDINE 20 MG PO TABS
ORAL_TABLET | ORAL | Status: AC
  Administered 2017-10-19: 20 mg via ORAL
  Filled 2017-10-19: qty 1

## 2017-10-19 MED ORDER — ONDANSETRON HCL 4 MG/2ML IJ SOLN
INTRAMUSCULAR | Status: DC | PRN
  Administered 2017-10-19: 4 mg via INTRAVENOUS

## 2017-10-19 MED ORDER — BISOPROLOL FUMARATE 5 MG PO TABS
5.0000 mg | ORAL_TABLET | Freq: Once | ORAL | Status: DC
  Filled 2017-10-19: qty 1

## 2017-10-19 MED ORDER — DEXAMETHASONE SODIUM PHOSPHATE 10 MG/ML IJ SOLN
INTRAMUSCULAR | Status: AC
  Filled 2017-10-19: qty 1

## 2017-10-19 MED ORDER — FENTANYL CITRATE (PF) 100 MCG/2ML IJ SOLN: 25.0000 ug | INTRAMUSCULAR | Status: DC | PRN

## 2017-10-19 MED ORDER — NEOSTIGMINE METHYLSULFATE 10 MG/10ML IV SOLN
INTRAVENOUS | Status: DC | PRN
  Administered 2017-10-19 (×2): 3 mg via INTRAVENOUS

## 2017-10-19 MED ORDER — OXYCODONE HCL 5 MG/5ML PO SOLN: 5.0000 mg | Freq: Once | ORAL | Status: DC | PRN

## 2017-10-19 MED ORDER — PROPOFOL 10 MG/ML IV BOLUS
INTRAVENOUS | Status: DC | PRN
  Administered 2017-10-19: 90 mg via INTRAVENOUS

## 2017-10-19 MED ORDER — SODIUM CHLORIDE 0.9 % IV SOLN
INTRAVENOUS | Status: DC
  Administered 2017-10-19: 07:00:00 via INTRAVENOUS

## 2017-10-19 MED ORDER — GLYCOPYRROLATE 0.2 MG/ML IJ SOLN
INTRAMUSCULAR | Status: DC | PRN
  Administered 2017-10-19 (×2): 0.2 mg via INTRAVENOUS

## 2017-10-19 MED ORDER — BRIMONIDINE TARTRATE 0.2 % OP SOLN
1.0000 [drp] | Freq: Every day | OPHTHALMIC | Status: DC | PRN
  Filled 2017-10-19 (×3): qty 5

## 2017-10-19 MED ORDER — MAGNESIUM SULFATE IN D5W 1-5 GM/100ML-% IV SOLN
1.0000 g | Freq: Once | INTRAVENOUS | Status: AC
  Administered 2017-10-19: 1 g via INTRAVENOUS
  Filled 2017-10-19: qty 100

## 2017-10-19 MED ORDER — PHENYLEPHRINE HCL 10 MG/ML IJ SOLN
INTRAMUSCULAR | Status: DC | PRN
  Administered 2017-10-19 (×4): 100 ug via INTRAVENOUS

## 2017-10-19 MED ORDER — ORAL CARE MOUTH RINSE: 15.0000 mL | Freq: Two times a day (BID) | OROMUCOSAL | Status: DC

## 2017-10-19 MED ORDER — SUCCINYLCHOLINE CHLORIDE 20 MG/ML IJ SOLN
INTRAMUSCULAR | Status: AC
  Filled 2017-10-19: qty 1

## 2017-10-19 MED ORDER — ACETAMINOPHEN 650 MG RE SUPP: 650.0000 mg | Freq: Four times a day (QID) | RECTAL | Status: DC | PRN

## 2017-10-19 MED ORDER — FAMOTIDINE 20 MG PO TABS
20.0000 mg | ORAL_TABLET | Freq: Once | ORAL | Status: AC
  Administered 2017-10-19: 20 mg via ORAL

## 2017-10-19 MED ORDER — HYDROCODONE-ACETAMINOPHEN 5-325 MG PO TABS: 1.0000 | ORAL_TABLET | Freq: Four times a day (QID) | ORAL | 0 refills | Status: DC | PRN

## 2017-10-19 MED ORDER — PROPOFOL 10 MG/ML IV BOLUS
INTRAVENOUS | Status: AC
  Filled 2017-10-19: qty 60

## 2017-10-19 MED ORDER — PHENYLEPHRINE HCL 10 MG/ML IJ SOLN
INTRAMUSCULAR | Status: AC
  Filled 2017-10-19: qty 1

## 2017-10-19 MED ORDER — OXYCODONE HCL 5 MG PO TABS: 5.0000 mg | ORAL_TABLET | Freq: Once | ORAL | Status: DC | PRN

## 2017-10-19 MED ORDER — POTASSIUM CHLORIDE 10 MEQ/100ML IV SOLN: 10.0000 meq | INTRAVENOUS | Status: DC

## 2017-10-19 MED ORDER — LIDOCAINE HCL (PF) 2 % IJ SOLN
INTRAMUSCULAR | Status: AC
  Filled 2017-10-19: qty 10

## 2017-10-19 MED ORDER — FENTANYL CITRATE (PF) 100 MCG/2ML IJ SOLN
INTRAMUSCULAR | Status: AC
  Filled 2017-10-19: qty 2

## 2017-10-19 MED ORDER — SUGAMMADEX SODIUM 200 MG/2ML IV SOLN
INTRAVENOUS | Status: DC | PRN
  Administered 2017-10-19: 120 mg via INTRAVENOUS

## 2017-10-19 MED ORDER — POTASSIUM CHLORIDE CRYS ER 20 MEQ PO TBCR
40.0000 meq | EXTENDED_RELEASE_TABLET | Freq: Once | ORAL | Status: AC
  Administered 2017-10-19: 40 meq via ORAL
  Filled 2017-10-19: qty 2

## 2017-10-19 MED ORDER — ROCURONIUM BROMIDE 100 MG/10ML IV SOLN
INTRAVENOUS | Status: DC | PRN
  Administered 2017-10-19: 30 mg via INTRAVENOUS
  Administered 2017-10-19: 20 mg via INTRAVENOUS

## 2017-10-19 MED ORDER — POTASSIUM CHLORIDE 10 MEQ/100ML IV SOLN
10.0000 meq | INTRAVENOUS | Status: DC
  Filled 2017-10-19: qty 100

## 2017-10-19 MED ORDER — VANCOMYCIN HCL IN DEXTROSE 1-5 GM/200ML-% IV SOLN
INTRAVENOUS | Status: AC
  Administered 2017-10-19: 1000 mg via INTRAVENOUS
  Filled 2017-10-19: qty 200

## 2017-10-19 MED ORDER — POLYETHYLENE GLYCOL 3350 17 G PO PACK: 17.0000 g | PACK | Freq: Every day | ORAL | Status: DC | PRN

## 2017-10-19 MED ORDER — SUGAMMADEX SODIUM 200 MG/2ML IV SOLN
INTRAVENOUS | Status: AC
  Filled 2017-10-19: qty 2

## 2017-10-19 MED ORDER — DEXAMETHASONE SODIUM PHOSPHATE 10 MG/ML IJ SOLN
INTRAMUSCULAR | Status: DC | PRN
  Administered 2017-10-19: 5 mg via INTRAVENOUS

## 2017-10-19 MED ORDER — LEVOTHYROXINE SODIUM 50 MCG PO TABS
25.0000 ug | ORAL_TABLET | Freq: Every day | ORAL | Status: DC
Start: 2017-10-19 — End: 2017-10-20
  Administered 2017-10-20: 25 ug via ORAL
  Filled 2017-10-19: qty 1

## 2017-10-19 MED ORDER — MIDODRINE HCL 5 MG PO TABS
5.0000 mg | ORAL_TABLET | ORAL | Status: DC
Start: 2017-10-19 — End: 2017-10-20
  Administered 2017-10-19: 5 mg via ORAL
  Filled 2017-10-19: qty 1

## 2017-10-19 MED ORDER — NALOXONE HCL 2 MG/2ML IJ SOSY
1.0000 mg | PREFILLED_SYRINGE | Freq: Once | INTRAMUSCULAR | Status: AC
  Administered 2017-10-19: 1 mg via INTRAVENOUS

## 2017-10-19 MED ORDER — MEPERIDINE HCL 50 MG/ML IJ SOLN: 6.2500 mg | INTRAMUSCULAR | Status: DC | PRN

## 2017-10-19 MED ORDER — FENTANYL CITRATE (PF) 100 MCG/2ML IJ SOLN
INTRAMUSCULAR | Status: DC | PRN
  Administered 2017-10-19: 50 ug via INTRAVENOUS

## 2017-10-19 MED ORDER — POTASSIUM CHLORIDE CRYS ER 20 MEQ PO TBCR: 20.0000 meq | EXTENDED_RELEASE_TABLET | ORAL | Status: DC

## 2017-10-19 MED ORDER — NALOXONE HCL 2 MG/2ML IJ SOSY
PREFILLED_SYRINGE | INTRAMUSCULAR | Status: AC
  Administered 2017-10-19: 1 mg via INTRAVENOUS
  Filled 2017-10-19: qty 2

## 2017-10-19 MED ORDER — POTASSIUM CHLORIDE CRYS ER 20 MEQ PO TBCR: 40.0000 meq | EXTENDED_RELEASE_TABLET | Freq: Once | ORAL | Status: DC

## 2017-10-19 MED ORDER — SODIUM CHLORIDE 0.9 % IV SOLN
INTRAVENOUS | Status: DC | PRN
  Administered 2017-10-19: 35 ug/min via INTRAVENOUS

## 2017-10-19 SURGICAL SUPPLY — 67 items
APPLIER CLIP 11 MED OPEN (CLIP)
APPLIER CLIP 9.375 SM OPEN (CLIP)
BAG DECANTER FOR FLEXI CONT (MISCELLANEOUS) ×4 IMPLANT
BLADE SURG 15 STRL LF DISP TIS (BLADE) ×2 IMPLANT
BLADE SURG 15 STRL SS (BLADE) ×2
BLADE SURG SZ11 CARB STEEL (BLADE) ×4 IMPLANT
BOOT SUTURE AID YELLOW STND (SUTURE) ×4 IMPLANT
BRUSH SCRUB EZ  4% CHG (MISCELLANEOUS) ×2
BRUSH SCRUB EZ 4% CHG (MISCELLANEOUS) ×2 IMPLANT
CANISTER SUCT 1200ML W/VALVE (MISCELLANEOUS) ×4 IMPLANT
CHLORAPREP W/TINT 26ML (MISCELLANEOUS) ×4 IMPLANT
CLIP APPLIE 11 MED OPEN (CLIP) IMPLANT
CLIP APPLIE 9.375 SM OPEN (CLIP) IMPLANT
DECANTER SPIKE VIAL GLASS SM (MISCELLANEOUS) IMPLANT
DERMABOND ADVANCED (GAUZE/BANDAGES/DRESSINGS) ×2
DERMABOND ADVANCED .7 DNX12 (GAUZE/BANDAGES/DRESSINGS) ×2 IMPLANT
DRAPE LAPAROTOMY 100X77 ABD (DRAPES) IMPLANT
DRESSING SURGICEL FIBRLLR 1X2 (HEMOSTASIS) IMPLANT
DRSG SURGICEL FIBRILLAR 1X2 (HEMOSTASIS)
ELECT CAUTERY BLADE 6.4 (BLADE) ×4 IMPLANT
ELECT REM PT RETURN 9FT ADLT (ELECTROSURGICAL) ×4
ELECTRODE REM PT RTRN 9FT ADLT (ELECTROSURGICAL) ×2 IMPLANT
GAUZE SPONGE NON-WVN 2X2 STRL (MISCELLANEOUS) IMPLANT
GEL ULTRASOUND 20GR AQUASONIC (MISCELLANEOUS) IMPLANT
GLOVE BIO SURGEON STRL SZ7 (GLOVE) ×8 IMPLANT
GLOVE INDICATOR 7.5 STRL GRN (GLOVE) IMPLANT
GLOVE SURG SYN 8.0 (GLOVE) ×4 IMPLANT
GOWN L4 XLG 20 PK N/S (GOWN DISPOSABLE) ×4 IMPLANT
GOWN STRL REUS W/ TWL LRG LVL3 (GOWN DISPOSABLE) ×2 IMPLANT
GOWN STRL REUS W/ TWL XL LVL3 (GOWN DISPOSABLE) IMPLANT
GOWN STRL REUS W/TWL LRG LVL3 (GOWN DISPOSABLE) ×2
GOWN STRL REUS W/TWL XL LVL3 (GOWN DISPOSABLE)
IV NS 500ML (IV SOLUTION) ×2
IV NS 500ML BAXH (IV SOLUTION) ×2 IMPLANT
KIT RM TURNOVER STRD PROC AR (KITS) ×4 IMPLANT
LABEL OR SOLS (LABEL) ×4 IMPLANT
LOOP RED MAXI  1X406MM (MISCELLANEOUS) ×2
LOOP VESSEL MAXI 1X406 RED (MISCELLANEOUS) ×2 IMPLANT
LOOP VESSEL MINI 0.8X406 BLUE (MISCELLANEOUS) ×2 IMPLANT
LOOPS BLUE MINI 0.8X406MM (MISCELLANEOUS) ×2
NEEDLE FILTER BLUNT 18X 1/2SAF (NEEDLE) ×2
NEEDLE FILTER BLUNT 18X1 1/2 (NEEDLE) ×2 IMPLANT
NEEDLE HYPO 25X1 1.5 SAFETY (NEEDLE) IMPLANT
NS IRRIG 500ML POUR BTL (IV SOLUTION) ×4 IMPLANT
PACK BASIN MINOR ARMC (MISCELLANEOUS) IMPLANT
PACK EXTREMITY ARMC (MISCELLANEOUS) ×4 IMPLANT
PAD PREP 24X41 OB/GYN DISP (PERSONAL CARE ITEMS) IMPLANT
SPONGE VERSALON 2X2 STRL (MISCELLANEOUS)
STOCKINETTE ORTHO 4X25 (MISCELLANEOUS) IMPLANT
STOCKINETTE STRL 4IN 9604848 (GAUZE/BANDAGES/DRESSINGS) ×4 IMPLANT
SUT MNCRL AB 4-0 PS2 18 (SUTURE) ×4 IMPLANT
SUT MNCRL+ 5-0 UNDYED PC-3 (SUTURE) ×2 IMPLANT
SUT MONOCRYL 5-0 (SUTURE) ×2
SUT PROLENE 6 0 BV (SUTURE) ×16 IMPLANT
SUT SILK 2 0 (SUTURE) ×2
SUT SILK 2 0 SH (SUTURE) IMPLANT
SUT SILK 2-0 18XBRD TIE 12 (SUTURE) ×2 IMPLANT
SUT SILK 3 0 (SUTURE) ×2
SUT SILK 3-0 18XBRD TIE 12 (SUTURE) ×2 IMPLANT
SUT SILK 4 0 (SUTURE) ×2
SUT SILK 4-0 18XBRD TIE 12 (SUTURE) ×2 IMPLANT
SUT VIC AB 0 CT2 27 (SUTURE) ×4 IMPLANT
SUT VIC AB 3-0 SH 27 (SUTURE) ×4
SUT VIC AB 3-0 SH 27X BRD (SUTURE) ×4 IMPLANT
SYR 10ML LL (SYRINGE) IMPLANT
SYR 20CC LL (SYRINGE) ×4 IMPLANT
SYR 3ML LL SCALE MARK (SYRINGE) ×4 IMPLANT

## 2017-10-19 NOTE — Progress Notes (Addendum)
Pharmacy Electrolyte Monitoring Consult:  Pharmacy consulted to assist in monitoring and replacing electrolytes in this 77 y.o. female admitted on 10/19/2017 with No chief complaint on file. Hemodialysis patient here for AV fistula placement with acute encephalopathy after procedure.  Labs:  Sodium (mmol/L)  Date Value  10/19/2017 136   Potassium (mmol/L)  Date Value  10/19/2017 2.7 (LL)   Magnesium (mg/dL)  Date Value  78/29/562111/19/2018 2.3   Phosphorus (mg/dL)  Date Value  30/86/578401/25/2019 2.4 (L)   Calcium (mg/dL)  Date Value  69/62/952801/25/2019 8.1 (L)   Albumin (g/dL)  Date Value  41/32/440101/25/2019 2.9 (L)    Plan: K 2.7,  Mag 1.7 Patient has Lasix 80 mg po ordered. Will order KCL IV 10 meq x 3 (30 meq total) as patient not awake enough for oral medication. This is conservative dosing due to Hemodialysis patient. Will order Magnesium sulfate 1 gram IV x 1. Will recheck K+ at 2000. F/u labs in am.  Addendum:  RN called and patient now awake to take PO and would like to switch from IV KCL to PO. Patient received KCL 10 meq IV x1. Will change to KCL 40 MEQ PO x 1. Conservative dosing for Hemodialysis pt. Recheck Potassium at 2000.  Naeema Patlan A 10/19/2017 2:01 PM

## 2017-10-19 NOTE — H&P (Signed)
[facility] History & Physical Update  The patient was interviewed and re-examined.  The patient's previous History and Physical has been reviewed and is unchanged.  There is no change in the plan of care. We plan to proceed with the scheduled procedure.  Levora DredgeGregory Anastazja Isaac, MD  10/19/2017, 7:33 AM

## 2017-10-19 NOTE — Discharge Instructions (Signed)
Attn Dialysis Clinic  Ms. Ann CoolerWilma Cunningham received Sugammadex during surgery. Removal of the drug complex requires the use of a high-flux filter at the next dialysis treatment. If there are any questions please call my office at [phone].   Thank you, Dr. Alver FisherAmy Cunningham Anesthesiology

## 2017-10-19 NOTE — Anesthesia Procedure Notes (Signed)
Procedure Name: Intubation Date/Time: 10/19/2017 7:45 AM Performed by: Ginger CarneMichelet, Ajahnae Rathgeber, CRNA Pre-anesthesia Checklist: Patient identified, Emergency Drugs available, Suction available, Patient being monitored and Timeout performed Patient Re-evaluated:Patient Re-evaluated prior to induction Oxygen Delivery Method: Circle system utilized Preoxygenation: Pre-oxygenation with 100% oxygen Induction Type: IV induction Ventilation: Mask ventilation without difficulty Laryngoscope Size: Miller and 2 Grade View: Grade I Tube type: Oral Tube size: 7.0 mm Number of attempts: 1 Airway Equipment and Method: Stylet Placement Confirmation: ETT inserted through vocal cords under direct vision,  positive ETCO2 and breath sounds checked- equal and bilateral Secured at: 21 cm Tube secured with: Tape Dental Injury: Teeth and Oropharynx as per pre-operative assessment  Difficulty Due To: Difficulty was unanticipated

## 2017-10-19 NOTE — Progress Notes (Signed)
°   10/19/17 1020  Clinical Encounter Type  Visited With Patient not available  Visit Type Code  Referral From Nurse  Consult/Referral To Chaplain  Spiritual Encounters  Spiritual Needs Prayer   CH responded to a CD Blue. Upon arrival CD Blue was cancelled but [facility] until [facility] was alert and CH followed up with nurse about family.

## 2017-10-19 NOTE — Anesthesia Preprocedure Evaluation (Signed)
Anesthesia Evaluation  Patient identified by MRN, date of birth, ID band Patient awake    Reviewed: Allergy & Precautions, NPO status , Patient's Chart, lab work & pertinent test results  History of Anesthesia Complications Negative for: history of anesthetic complications  Airway Mallampati: II  TM Distance: >3 FB Neck ROM: Full    Dental no notable dental hx.    Pulmonary neg sleep apnea, neg COPD, former smoker,    breath sounds clear to auscultation- rhonchi (-) wheezing      Cardiovascular hypertension, +CHF  (-) CAD, (-) Past MI, (-) Cardiac Stents and (-) CABG  Rhythm:Regular Rate:Normal - Systolic murmurs and - Diastolic murmurs Echo 08/14/17: - Left ventricle: The cavity size was mildly dilated. There was   mild concentric hypertrophy. Systolic function was severely   reduced. The estimated ejection fraction was in the range of 25%   to 30%. Diffuse hypokinesis. Regional wall motion abnormalities   cannot be excluded. Doppler parameters are consistent with   abnormal left ventricular relaxation (grade 1 diastolic   dysfunction). - Mitral valve: There was mild to moderate regurgitation. - Left atrium: The atrium was normal in size. - Right ventricle: Systolic function was normal. - Pulmonary arteries: Systolic pressure was mildly elevated. PA   peak pressure: 36 mm Hg (S). - Pericardium, extracardiac: A small pericardial effusion was   identified.    Neuro/Psych negative neurological ROS  negative psych ROS   GI/Hepatic negative GI ROS, Neg liver ROS,   Endo/Other  Hypothyroidism   Renal/GU ESRF and DialysisRenal disease (last dialysis yesterday 10/18/17)     Musculoskeletal negative musculoskeletal ROS (+)   Abdominal (+) - obese,   Peds  Hematology  (+) anemia ,   Anesthesia Other Findings Past Medical History: No date: Anemia No date: Cardiomyopathy (HCC) No date: CHF (congestive heart failure)  (HCC) No date: CKD (chronic kidney disease) No date: Glaucoma No date: Hypertension No date: Hypothyroidism No date: Pressure ulcer of sacrum No date: Renal dialysis device, implant, or graft complication No date: Renal disorder No date: Uses walker   Reproductive/Obstetrics                             Anesthesia Physical Anesthesia Plan  ASA: IV  Anesthesia Plan: General   Post-op Pain Management:    Induction: Intravenous  PONV Risk Score and Plan: 2 and Ondansetron and Dexamethasone  Airway Management Planned: Oral ETT  Additional Equipment:   Intra-op Plan:   Post-operative Plan: Extubation in OR  Informed Consent: I have reviewed the patients History and Physical, chart, labs and discussed the procedure including the risks, benefits and alternatives for the proposed anesthesia with the patient or authorized representative who has indicated his/her understanding and acceptance.   Dental advisory given  Plan Discussed with: CRNA and Anesthesiologist  Anesthesia Plan Comments:         Anesthesia Quick Evaluation

## 2017-10-19 NOTE — Progress Notes (Addendum)
Overhead call to PACU. On arrival pt had desat with spO2 70s, being bag mask ventilated, palpable pulses, spO2 quickly recovered and pt placed back on O2 by face mask. Noted to have very shallow respirations. Had 4 twitches at the end of the case and was given neostigmine for reversal. Decision made to give additional neostigmine. Maintaining O2 sat 100% on facemask at 5L but respirations remained shallow and patient still appeared weak 10 min after second  Neostigmine dose. Decision to give sugammadex. Within minutes respirations improved and pt was now able to cough.   Plan to admit for overnight observation.  1110: Despite improved respirations pt remains sedated, going ahead with narcan and will obtain ABG to evaluate CO2 levels   Attn Dialysis Clinic  Ms. Dorthy CoolerWilma Czarnecki received Sugammadex during surgery. Removal of the drug complex requires the use of a high-flux filter at the next dialysis treatment. If there are any questions please call my office at [phone].   Thank you, Dr. Alver FisherAmy Javarius Tsosie Anesthesiology

## 2017-10-19 NOTE — Progress Notes (Signed)
Night of surgery  Patient alert sitting up in bed she is eaten the majority of her dinner tray.  She denies pain.  Right arm AV fistula good thrill good bruit.  Status post AV fistula creation with postop hypoxic event likely secondary to narcotics.  She is now back to her baseline and doing quite well.  Plan will be for discharge tomorrow

## 2017-10-19 NOTE — Anesthesia Post-op Follow-up Note (Signed)
Anesthesia QCDR form completed.        

## 2017-10-19 NOTE — H&P (Signed)
[facility] - [place] [facility]   PATIENT NAME: Ann CoolerWilma Cunningham    MR#:  [id number]  DATE OF BIRTH:  [birth year]  DATE OF ADMISSION:  10/19/2017  PRIMARY CARE PHYSICIAN: [facility]   REQUESTING/REFERRING PHYSICIAN: dr schnier  CHIEF COMPLAINT:   AMS HISTORY OF PRESENT ILLNESS:  Ann Cunningham  is a 77 y.o. female with a known history of end-stage renal disease recently started on hemodialysis (was on peritoneal dialysis) who presented today for AV fistula placement. Apparently when patient presented this morning she was in her usual state of health.  After her surgery patient was noted to have hypoxia with desaturation of oxygen in the 70s. She was bag mask ventilated. She had palpable pulses. Her O2 quickly recovered and she was placed on a nonrebreather. She was given neostigmine 2 for reversal due to hypoxia and shallow breathing. She also received sugammadex and within minutes her respirations improved. Despite improved respirations patient remained sedated and is hard to arouse. According to the nurse who is at bedside she does state that after the surgery patient was awake for a period of time. She is currently hard to arouse even to sternal rub.  Hospitalist was asked to admit the patient due to acute encephalopathy.    PAST MEDICAL HISTORY:   Past Medical History:  Diagnosis Date  . Anemia   . Cardiomyopathy (HCC)   . CHF (congestive heart failure) (HCC)   . CKD (chronic kidney disease)   . Glaucoma   . Hypertension   . Hypothyroidism   . Pressure ulcer of sacrum   . Renal dialysis device, implant, or graft complication   . Renal disorder   . Uses walker     PAST SURGICAL HISTORY:   Past Surgical History:  Procedure Laterality Date  . CAPD INSERTION N/A 08/15/2017   Procedure: LAPAROSCOPIC INSERTION CONTINUOUS AMBULATORY PERITONEAL DIALYSIS  (CAPD) CATHETER;  Surgeon: Renford DillsSchnier, Gregory G, MD;  Location: [facility] ORS;  Service:  Vascular;  Laterality: N/A;  . CATARACT EXTRACTION W/ INTRAOCULAR LENS IMPLANT    . CATARACT EXTRACTION W/PHACO Right 12/25/2016   Procedure: CATARACT EXTRACTION PHACO AND INTRAOCULAR LENS PLACEMENT (IOC)  Right complicated;  Surgeon: Sherald HessAnita Prakash Vin-Parikh, MD;  Location: [facility];  Service: Ophthalmology;  Laterality: Right;  V[facility]  . DIALYSIS/PERMA CATHETER INSERTION N/A 08/20/2017   Procedure: DIALYSIS/PERMA CATHETER INSERTION;  Surgeon: Annice Needyew, Jason S, MD;  Location: [facility] INVASIVE CV LAB;  Service: Cardiovascular;  Laterality: N/A;    SOCIAL HISTORY:   Social History   Tobacco Use  . Smoking status: Former Smoker    Packs/day: 0.50    Types: Cigarettes    Last attempt to quit: 06/14/2017    Years since quitting: 0.3  . Smokeless tobacco: Former NeurosurgeonUser    Types: Snuff  Substance Use Topics  . Alcohol use: No    FAMILY HISTORY:   Family History  Problem Relation Age of Onset  . Hypertension Mother     DRUG ALLERGIES:   Allergies  Allergen Reactions  . Penicillins     Other reaction(s): OTHER    REVIEW OF SYSTEMS:   Review of Systems  Unable to perform ROS: Acuity of condition    MEDICATIONS AT HOME:   Prior to Admission medications   Medication Sig Start Date End Date Taking? Authorizing Provider  bisoprolol (ZEBETA) 5 MG tablet Take 1 tablet (5 mg total) by mouth at bedtime. 08/22/17  Yes Alford HighlandWieting, Richard, MD  levothyroxine (SYNTHROID, LEVOTHROID)  25 MCG tablet Take 25 mcg by mouth daily before breakfast.   Yes [provider]  acetaminophen (TYLENOL) 325 MG tablet Take 2 tablets (650 mg total) by mouth every 6 (six) hours as needed for mild pain (or Fever >/= 101). 08/22/17   Alford Highland, MD  amLODipine (NORVASC) 5 MG tablet Take 1 tablet (5 mg total) by mouth daily. Patient not taking: Reported on 10/15/2017 08/16/17   Enid Baas, MD  aspirin 81 MG tablet Take 81 mg by mouth daily.    [provider]  brimonidine  (ALPHAGAN) 0.2 % ophthalmic solution Place 1 drop into both eyes daily as needed (glaucoma). 08/22/17   Alford Highland, MD  brinzolamide (AZOPT) 1 % ophthalmic suspension Place 1 drop into both eyes at bedtime. 08/22/17   Alford Highland, MD  diclofenac sodium (VOLTAREN) 1 % GEL Apply 2 g topically 4 (four) times daily. 01/04/17   Phineas Semen, MD  furosemide (LASIX) 80 MG tablet Take 1 tablet (80 mg total) by mouth daily. 08/15/17 10/15/17  Enid Baas, MD  gentamicin cream (GARAMYCIN) 0.1 % Apply 1 application topically daily. Patient not taking: Reported on 10/15/2017 08/23/17   Alford Highland, MD  HYDROcodone-acetaminophen [facility]) 5-325 MG tablet Take 1-2 tablets by mouth every 6 (six) hours as needed. 10/19/17   Schnier, Latina Craver, MD  midodrine (PROAMATINE) 5 MG tablet Take 5 mg by mouth 3 (three) times a week. Give prior to dialysis on Tuesdays, Thursdays and Saturdays.    [provider]  multivitamin (RENA-VIT) TABS tablet Take 1 tablet by mouth at bedtime. 08/22/17   Alford Highland, MD  Nutritional Supplements (FEEDING SUPPLEMENT, NEPRO CARB STEADY,) LIQD Take 237 mLs by mouth 3 (three) times daily between meals. Patient not taking: Reported on 10/15/2017 08/22/17   Alford Highland, MD  polyethylene glycol [facility] Ethelene Hal) packet Take 17 g by mouth daily.    [provider]  zinc oxide 20 % ointment Apply 1 application topically as needed for irritation (apply to buttocks every day shift.).    [provider]      VITAL SIGNS:  Blood pressure 138/73, pulse 87, temperature (!) 97.3 F (36.3 C), temperature source Temporal, resp. rate (!) 0, SpO2 100 %.  PHYSICAL EXAMINATION:   Physical Exam  Constitutional: No distress.  Thin, frail  HENT:  Head: Normocephalic.  Eyes: Pupils are equal, round, and reactive to light. No scleral icterus.  Neck: No JVD present. No tracheal deviation present. No thyromegaly present.  Cardiovascular: Normal  rate, regular rhythm and normal heart sounds. Exam reveals no gallop and no friction rub.  No murmur heard. Pulmonary/Chest: Effort normal and breath sounds normal. No respiratory distress. She has no wheezes. She has no rales. She exhibits no tenderness.  Abdominal: Soft. Bowel sounds are normal. She exhibits no distension and no mass. There is no tenderness. There is no rebound and no guarding.  Musculoskeletal: Normal range of motion. She exhibits no edema.  Lymphadenopathy:    She has no cervical adenopathy.  Neurological:  Sedated hard to arouse  Skin: Skin is warm. No rash noted. She is not diaphoretic. No erythema.  AV fistula placed right arm She has temporary dialysis catheter on chest wall  Psychiatric:  Hard to arouse      LABORATORY PANEL:   CBC Recent Labs  Lab 10/15/17 1109 10/19/17 0640  WBC 6.4  --   HGB 10.2* 10.2*  HCT 31.3* 30.0*  PLT 382  --    ------------------------------------------------------------------------------------------------------------------  Chemistries  Recent Labs  Lab 10/15/17 1109 10/19/17 0640  NA 131* 138  K 3.1* 3.9  CL 97*  --   CO2 23  --   GLUCOSE 109* 94  BUN 32*  --   CREATININE 2.69*  --   CALCIUM 8.6*  --    ------------------------------------------------------------------------------------------------------------------  Cardiac Enzymes No results for input(s): TROPONINI in the last 168 hours. ------------------------------------------------------------------------------------------------------------------  RADIOLOGY:  No results found.  EKG:   Orders placed or performed during the hospital encounter of 08/13/17  . ED EKG  . ED EKG  . EKG 12-Lead  . EKG 12-Lead    IMPRESSION AND PLAN:   77 year old female with end-stage renal disease on hemodialysis who presented today for AV fistula placement is now hard to arouse.  1. Acute encephalopathy, with hypoxia and CO2 retention Continue to monitor  patient's mental status I believe that this may be related to anesthesia I will also consult nephrology to see if they want to try to dialyze her today  2. End-stage renal disease now on hemodialysis: Consultation with nephrology requested  3. Hypothyroidism: Continue Synthroid once patient's more alert  4. 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    Giorgi Debruin M.D on 10/19/2017 at 11:30 AM  Between 7am to 6pm - Pager - [phone]  After 6pm go to [email or web address] - Social research officer, government  [facility]  Office  [phone]  CC: Primary care physician; Center, [facility]

## 2017-10-19 NOTE — Anesthesia Postprocedure Evaluation (Signed)
Anesthesia Post Note  Patient: Ann Cunningham  Procedure(s) Performed: Radiocephalic fistula creation (Right ) REMOVAL OF A DIALYSIS CATHETER (N/A )  Patient location during evaluation: PACU Anesthesia Type: General Level of consciousness: awake and alert Pain management: pain level controlled Vital Signs Assessment: post-procedure vital signs reviewed and stable Respiratory status: spontaneous breathing, nonlabored ventilation, respiratory function stable and patient connected to nasal cannula oxygen Cardiovascular status: blood pressure returned to baseline and stable Postop Assessment: no signs of nausea or vomiting Anesthetic complications: no   Please see progress note for additional details of PACU events. Pt admitted for observation.  Last Vitals:  Vitals:   10/19/17 1224 10/19/17 1257  BP: 124/68 129/66  Pulse: 79 85  Resp: 12 14  Temp: (!) 36.2 C (!) 36.1 C  SpO2: 99% 100%    Last Pain:  Vitals:   10/19/17 1257  TempSrc: Axillary                 [name]

## 2017-10-19 NOTE — Progress Notes (Signed)
Initial Nutrition Assessment  DOCUMENTATION CODES:   Severe malnutrition in context of chronic illness  INTERVENTION:   Nepro Shake po BID, each supplement provides 425 kcal and 19 grams protein  Rena-vite daily   NUTRITION DIAGNOSIS:   Severe Malnutrition related to catabolic illness(ESRD on HD, CHF) as evidenced by severe fat depletion, severe muscle depletion.  GOAL:   Patient will meet greater than or equal to 90% of their needs  MONITOR:   PO intake, Supplement acceptance, Weight trends, Labs, I & O's, Skin  REASON FOR ASSESSMENT:   Malnutrition Screening Tool    ASSESSMENT:   77 year old female with PMHx of HTN, CHF, FTT, end-stage renal disease on hemodialysis who presented today for AV fistula placement admitted for encephalopathy     RD familiar with this pt from previous admit. Pt with poor appetite and oral intake at baseline; receives Meals on Wheels 5 days a week at home. Per chart, pt with 16lb(11%) wt loss from August to November of last year but has remained weight stable since November. RD will order supplements; pt already ordered for renal MV. Pt with low K, Mg, and P today; these are being supplemented.   Medications reviewed and include: aspirin, lasix, heparin, synthroid, MV, Mg sulfate, KCl  Labs reviewed: K 2.7(L), Cl 97(L), BUN 30(H), creat 1.76(H), Ca 8.1(L) adj. 8.98(L), P 2.4(L), Mg 1.7 wnl, alb 2.9(L) Hgb 10.2(L), Hct 30.0(L) iPTH 185(H)- 08/18/17  Nutrition-Focused physical exam completed. Findings are severe fat and muscle depletions over entire body, and no edema.   Diet Order:  Diet renal with fluid restriction Fluid restriction: 1200 mL Fluid; Room service appropriate? Yes; Fluid consistency: Thin  EDUCATION NEEDS:   No education needs have been identified at this time  Skin:  Skin Assessment: (closed incision abdomen s/p CAPD cath, incision arm s/p AVF placement )  Last BM:  pta  Height:   Ht Readings from Last 1 Encounters:   10/19/17 5\' 2"  (1.575 m)    Weight:   Wt Readings from Last 1 Encounters:  10/19/17 134 lb 4.2 oz (60.9 kg)    Ideal Body Weight:  50 kg  BMI:  Body mass index is 24.56 kg/m.  Estimated Nutritional Needs:   Kcal:  1400-1600kcal/day   Protein:  85-91g/day   Fluid:  per MD  Betsey Holidayasey Zaineb Nowaczyk MS, RD, LDN Pager #[phone]- [phone] After Hours Pager: [phone]

## 2017-10-19 NOTE — Progress Notes (Signed)
Pharmacy Electrolyte Monitoring Consult:  Pharmacy consulted to assist in monitoring and replacing electrolytes in this 77 y.o. female admitted on 10/19/2017 with No chief complaint on file. Hemodialysis patient here for AV fistula placement with acute encephalopathy after procedure.  Labs:  Sodium (mmol/L)  Date Value  10/19/2017 136   Potassium (mmol/L)  Date Value  10/19/2017 4.1   Magnesium (mg/dL)  Date Value  14/78/295601/25/2019 2.1   Phosphorus (mg/dL)  Date Value  21/30/865701/25/2019 2.4 (L)   Calcium (mg/dL)  Date Value  84/69/629501/25/2019 8.1 (L)   Albumin (g/dL)  Date Value  28/41/324401/25/2019 2.9 (L)    Plan: K 2.7,  Mag 1.7 Patient has Lasix 80 mg po ordered. Will order KCL IV 10 meq x 3 (30 meq total) as patient not awake enough for oral medication. This is conservative dosing due to Hemodialysis patient. Will order Magnesium sulfate 1 gram IV x 1. Will recheck K+ at 2000. F/u labs in am.  Addendum:  RN called and patient now awake to take PO and would like to switch from IV KCL to PO. Patient received KCL 10 meq IV x1. Will change to KCL 40 MEQ PO x 1. Conservative dosing for Hemodialysis pt. Recheck Potassium at 2000.  1/25:  K @ 20:10 = 4.1 .  No additional K supplementation needed.  Will recheck electrolytes on 1/25 with AM labs.   Ann Cunningham 10/19/2017 9:53 PM

## 2017-10-19 NOTE — Transfer of Care (Signed)
Immediate Anesthesia Transfer of Care Note  Patient: Ann Cunningham  Procedure(s) Performed: Radiocephalic fistula creation (Right ) REMOVAL OF A DIALYSIS CATHETER (N/A )  Patient Location: PACU  Anesthesia Type:General  Level of Consciousness: awake  Airway & Oxygen Therapy: Patient Spontanous Breathing and Patient connected to face mask oxygen  Post-op Assessment: Report given to RN and Post -op Vital signs reviewed and stable  Post vital signs: Reviewed and stable  Last Vitals:  Vitals:   10/19/17 0618 10/19/17 1009  BP: (!) 150/69 (!) 152/81  Pulse: (!) 110 94  Resp: 18 (!) 22  Temp: (!) 36.1 C (!) 36.3 C  SpO2: 97% 100%    Last Pain:  Vitals:   10/19/17 1009  TempSrc: Temporal         Complications: No apparent anesthesia complications

## 2017-10-19 NOTE — Op Note (Signed)
OPERATIVE NOTE   PROCEDURE: right radiocephalic arteriovenous fistula placement  PRE-OPERATIVE DIAGNOSIS: End Stage Renal Disease  POST-OPERATIVE DIAGNOSIS: End Stage Renal Disease  SURGEON: Levora Dredge  ASSISTANT(S): None  ANESTHESIA: general  ESTIMATED BLOOD LOSS: <50 cc  FINDING(S): 4 mm cephalic vein at the wrist  SPECIMEN(S):  none  INDICATIONS:   Ann Cunningham is a 77 y.o. female who presents with end stage renal disease.  The patient is scheduled for right brachiocephalic arteriovenous fistula placement.  The patient is aware the risks include but are not limited to: bleeding, infection, steal syndrome, nerve damage, ischemic monomelic neuropathy, failure to mature, and need for additional procedures.  The patient is aware of the risks of the procedure and elects to proceed forward.  DESCRIPTION: After full informed written consent was obtained from the patient, the patient was brought back to the operating room and placed supine upon the operating table.  Prior to induction, the patient received IV antibiotics.   After obtaining adequate anesthesia, the patient was then prepped and draped in the standard fashion for a right arm access procedure.    A linear incision was then created midway between the radial impulse and the cephalic vein. The cephalic vein was then identified and dissected circumferentially. It was marked with a surgical marker.    Attention was then turned to the radial artery which was exposed through the same incision and looped proximally and distally. Side branches were controlled with 4-0 silk ties.  The distal segment of the vein was ligated with a  2-0 silk, and the vein was transected.  The proximal segment was interrogated with serial dilators.  The vein accepted up to a 3.5 mm dilator without any difficulty. Heparinized saline was infused into the vein and clamped it with a small bulldog.  At this point, I reset my exposure of the  brachial artery and controlled the artery with vessel loops proximally and distally.  An arteriotomy was then made with a #11 blade, and extended with a Potts scissor.  Heparinized saline was injected proximal and distal into the radial artery.  The vein was then approximated to the artery while the artery was in its native bed and subsequently the vein was beveled using Potts scissors. The vein was then sewn to the artery in an end-to-side configuration with a running stitch of 6-0 Prolene.  Prior to completing this anastomosis Flushing maneuvers were performed and the artery was allowed to forward and back bleed.  There was no evidence of clot from any vessels.  I completed the anastomosis in the usual fashion and then released all vessel loops and clamps.    There was good  thrill in the venous outflow, and there was 1+ palpable radial pulse.  At this point, I irrigated out the surgical wound.  There was no further active bleeding.  The subcutaneous tissue was reapproximated with a running stitch of 3-0 Vicryl.  The skin was then reapproximated with a running subcuticular stitch of 4-0 Vicryl.  The skin was then cleaned, dried, and reinforced with Dermabond.    The peritoneal dialysis catheter and surrounding area is prepped and draped in a sterile fashion. The cuff was localized.  After appropriate timeout is called, local anesthetic with epinephrine is infiltrated into the surrounding tissues around the cuff in the left lower quadrant. Previous incision is open with an 15 blade scalpel and the dissection was carried down to expose the cuff of the tunneled catheter.  The catheter  is then freed from the surrounding attachments and adhesions. Once the catheter has been freed circumferentially a pursestring suture of 0 Vicryl is placed within the anterior rectus sheath. The catheter is then removed from the peritoneal cavity and the pursestring suture secured. The second cuff is then localized and dissected  free. The catheter is transected just distal to the cuff and subsequently removed in 2 pieces. The lower quadrant incision is then irrigated and closed in layers with Vicryl.. A 4-0 Monocryl was used close the skin. Dermabond is applied to the incision.   Antibiotic ointment and a sterile dressing is applied to the exit site.  Patient tolerated procedure well and there were no complications.    COMPLICATIONS: None  CONDITION: Ann Cunningham   Ann Cunningham [facility]  Office: [phone]   10/19/2017, 9:58 AM

## 2017-10-20 ENCOUNTER — Encounter: Payer: Self-pay | Admitting: Vascular Surgery

## 2017-10-20 DIAGNOSIS — E43 Unspecified severe protein-calorie malnutrition: Secondary | ICD-10-CM

## 2017-10-20 DIAGNOSIS — R0902 Hypoxemia: Secondary | ICD-10-CM | POA: Diagnosis not present

## 2017-10-20 DIAGNOSIS — G92 Toxic encephalopathy: Secondary | ICD-10-CM | POA: Diagnosis not present

## 2017-10-20 LAB — BASIC METABOLIC PANEL
Anion gap: 9 (ref 5–15)
BUN: 39 mg/dL — AB (ref 6–20)
CALCIUM: 8.1 mg/dL — AB (ref 8.9–10.3)
CO2: 29 mmol/L (ref 22–32)
CREATININE: 2.19 mg/dL — AB (ref 0.44–1.00)
Chloride: 98 mmol/L — ABNORMAL LOW (ref 101–111)
GFR calc Af Amer: 24 mL/min — ABNORMAL LOW (ref >60)
GFR calc non Af Amer: 21 mL/min — ABNORMAL LOW (ref >60)
Glucose, Bld: 103 mg/dL — ABNORMAL HIGH (ref 65–99)
Potassium: 3.4 mmol/L — ABNORMAL LOW (ref 3.5–5.1)
Sodium: 136 mmol/L (ref 135–145)

## 2017-10-20 LAB — CBC
HCT: 26.1 % — ABNORMAL LOW (ref 35.0–47.0)
Hemoglobin: 8.5 g/dL — ABNORMAL LOW (ref 12.0–16.0)
MCH: 28.4 pg (ref 26.0–34.0)
MCHC: 32.7 g/dL (ref 32.0–36.0)
MCV: 87 fL (ref 80.0–100.0)
PLATELETS: 396 10*3/uL (ref 150–440)
RBC: 3 MIL/uL — ABNORMAL LOW (ref 3.80–5.20)
RDW: 16.2 % — AB (ref 11.5–14.5)
WBC: 7.8 10*3/uL (ref 3.6–11.0)

## 2017-10-20 LAB — PHOSPHORUS: PHOSPHORUS: 2.5 mg/dL (ref 2.5–4.6)

## 2017-10-20 MED ORDER — EPOETIN ALFA 10000 UNIT/ML IJ SOLN
10000.0000 [IU] | INTRAMUSCULAR | Status: DC
  Administered 2017-10-20: 10000 [IU] via INTRAVENOUS

## 2017-10-20 NOTE — Addendum Note (Signed)
Addendum  created 10/20/17 [id number] by Alver FisherPenwarden, Effa Yarrow, MD   Sign clinical note

## 2017-10-20 NOTE — Progress Notes (Signed)
Stopped to check on on patient this morning. Easily conversant, mental status appears to be back to baseline. Per nursing notes she was awake last night and able to eat dinner. Seems that AMS yesterday after surgery may have just been due to sedation of anesthesia and very delayed emergence.

## 2017-10-20 NOTE — Discharge Summary (Signed)
[facility] -  at [facility]   PATIENT NAME: Ann Cunningham    MR#:  [id number]  DATE OF BIRTH:  Apr 03, 1941  DATE OF ADMISSION:  10/19/2017 ADMITTING PHYSICIAN: Renford Dills, MD  DATE OF DISCHARGE: 10/20/2017  PRIMARY CARE PHYSICIAN: Center, [facility]    ADMISSION DIAGNOSIS:  ESRD  DISCHARGE DIAGNOSIS:  Acute metabolic encephalopathy secondary to hypoxia and hypercarbia resolved End-stage renal disease on hemodialysis SECONDARY DIAGNOSIS:   Past Medical History:  Diagnosis Date  . Anemia   . Cardiomyopathy (HCC)   . CHF (congestive heart failure) (HCC)   . CKD (chronic kidney disease)   . Glaucoma   . Hypertension   . Hypothyroidism   . Pressure ulcer of sacrum   . Renal dialysis device, implant, or graft complication   . Renal disorder   . Uses walker     HOSPITAL COURSE:  77 year old female with end-stage renal disease on hemodialysis who presented today for AV fistula placement is now hard to arouse.  1. Acute encephalopathy, with hypoxia and CO2 retention Continue to monitor patient's mental status I believe that this may be related to anesthesia Patient sats are 96% on room air.  She is alert and oriented x3.  2. End-stage renal disease now on hemodialysis: Consultation with nephrology appreciated. She is getting dialyzed today.  3. Hypothyroidism: Continue Synthroid   4. Chronic systolic and diastolic heart failure ejection fraction 25-30%: Patient is without signs of exacerbation  Overall at baseline.  [facility]. Was discussed with nephrology Dr. Cherylann Ratel  CONSULTS OBTAINED:  Treatment Team:  Adrian Saran, MD Mady Haagensen, MD  DRUG ALLERGIES:   Allergies  Allergen Reactions  . Penicillins     Other reaction(s): OTHER    DISCHARGE MEDICATIONS:   Allergies as of 10/20/2017      Reactions   Penicillins    Other reaction(s): OTHER       Medication List    TAKE these medications   acetaminophen 325 MG tablet Commonly known as:  TYLENOL Take 2 tablets (650 mg total) by mouth every 6 (six) hours as needed for mild pain (or Fever >/= 101).   amLODipine 5 MG tablet Commonly known as:  NORVASC Take 1 tablet (5 mg total) by mouth daily.   aspirin 81 MG tablet Take 81 mg by mouth daily.   bisoprolol 5 MG tablet Commonly known as:  ZEBETA Take 1 tablet (5 mg total) by mouth at bedtime.   brimonidine 0.2 % ophthalmic solution Commonly known as:  ALPHAGAN Place 1 drop into both eyes daily as needed (glaucoma).   brinzolamide 1 % ophthalmic suspension Commonly known as:  AZOPT Place 1 drop into both eyes at bedtime.   diclofenac sodium 1 % Gel Commonly known as:  VOLTAREN Apply 2 g topically 4 (four) times daily.   feeding supplement (NEPRO CARB STEADY) Liqd Take 237 mLs by mouth 3 (three) times daily between meals.   furosemide 80 MG tablet Commonly known as:  LASIX Take 1 tablet (80 mg total) by mouth daily.   gentamicin cream 0.1 % Commonly known as:  GARAMYCIN Apply 1 application topically daily.   HYDROcodone-acetaminophen 5-325 MG tablet Commonly known as:  NORCO Take 1-2 tablets by mouth every 6 (six) hours as needed.   levothyroxine 25 MCG tablet Commonly known as:  SYNTHROID, LEVOTHROID Take 25 mcg by mouth daily before breakfast.   midodrine 5 MG tablet Commonly  known as:  PROAMATINE Take 5 mg by mouth 3 (three) times a week. Give prior to dialysis on Tuesdays, Thursdays and Saturdays.   multivitamin Tabs tablet Take 1 tablet by mouth at bedtime.   polyethylene glycol packet Commonly known as:  MIRALAX / GLYCOLAX Take 17 g by mouth daily.   zinc oxide 20 % ointment Apply 1 application topically as needed for irritation (apply to buttocks every day shift.).       If you experience worsening of your admission symptoms, develop shortness of breath, life threatening emergency,  suicidal or homicidal thoughts you must seek medical attention immediately by calling 911 or calling your MD immediately  if symptoms less severe.  You Must read complete instructions/literature along with all the possible adverse reactions/side effects for all the Medicines you take and that have been prescribed to you. Take any new Medicines after you have completely understood and accept all the possible adverse reactions/side effects.   Please note  You were cared for by a hospitalist during your hospital stay. If you have any questions about your discharge medications or the care you received while you were in the hospital after you are discharged, you can call the unit and asked to speak with the hospitalist on call if the hospitalist that took care of you is not available. Once you are discharged, your primary care physician will handle any further medical issues. Please note that NO REFILLS for any discharge medications will be authorized once you are discharged, as it is imperative that you return to your primary care physician (or establish a relationship with a primary care physician if you do not have one) for your aftercare needs so that they can reassess your need for medications and monitor your lab values. Today   SUBJECTIVE    Patient seen at dialysis denies any complaints VITAL SIGNS:  Blood pressure (!) 100/57, pulse (!) 108, temperature 97.8 F (36.6 C), temperature source Oral, resp. rate 19, height 5\' 2"  (1.575 m), weight 60.3 kg (132 lb 15 oz), SpO2 96 %.  I/O:    Intake/Output Summary (Last 24 hours) at 10/20/2017 1258 Last data filed at 10/19/2017 1838 Gross per 24 hour  Intake 240 ml  Output -  Net 240 ml    PHYSICAL EXAMINATION:  GENERAL:  77 y.o.-year-old patient lying in the bed with no acute distress.  EYES: Pupils equal, round, reactive to light and accommodation. No scleral icterus. Extraocular muscles intact.  HEENT: Head atraumatic, normocephalic.  Oropharynx and nasopharynx clear.  NECK:  Supple, no jugular venous distention. No thyroid enlargement, no tenderness.  LUNGS: Normal breath sounds bilaterally, no wheezing, rales,rhonchi or crepitation. No use of accessory muscles of respiration.  CARDIOVASCULAR: S1, S2 normal. No murmurs, rubs, or gallops.  ABDOMEN: Soft, non-tender, non-distended. Bowel sounds present. No organomegaly or mass.  EXTREMITIES: No pedal edema, cyanosis, or clubbing.  NEUROLOGIC: Cranial nerves II through XII are intact. Muscle strength 5/5 in all extremities. Sensation intact. Gait not checked.  PSYCHIATRIC: The patient is alert and oriented x 3.  SKIN: No obvious rash, lesion, or ulcer.   DATA REVIEW:   CBC  Recent Labs  Lab 10/20/17 0455  WBC 7.8  HGB 8.5*  HCT 26.1*  PLT 396    Chemistries  Recent Labs  Lab 10/19/17 1516  10/20/17 0455  NA  --   --  136  K  --    < > 3.4*  CL  --   --  98*  CO2  --   --  29  GLUCOSE  --   --  103*  BUN  --   --  39*  CREATININE 1.78*  --  2.19*  CALCIUM  --   --  8.1*  MG 2.1  --   --    < > = values in this interval not displayed.    Microbiology Results   Recent Results (from the past 240 hour(s))  MRSA PCR Screening     Status: None   Collection Time: 10/19/17  1:50 PM  Result Value Ref Range Status   MRSA by PCR NEGATIVE NEGATIVE Final    Comment:        The GeneXpert MRSA Assay (FDA approved for NASAL specimens only), is one component of a comprehensive MRSA colonization surveillance program. It is not intended to diagnose MRSA infection nor to guide or monitor treatment for MRSA infections. Performed at [facility], [address], [place], [place] [address]     RADIOLOGY:  No results found.   Management plans discussed with the patient, family and they are in agreement.  CODE STATUS:     Code Status Orders  (From admission, onward)        Start     Ordered   10/19/17 1311  Full code  Continuous      10/19/17 1310    Code Status History    Date Active Date Inactive Code Status Order ID Comments User Context   08/17/2017 21:22 08/22/2017 20:14 DNR [id number]  Alford Highland, MD ED   08/14/2017 04:59 08/15/2017 19:41 Full Code [id number]  Arnaldo Natal, MD Inpatient      TOTAL TIME TAKING CARE OF THIS PATIENT: 40 minutes.    Enedina Finner M.D on 10/20/2017 at 12:58 PM  Between 7am to 6pm - Pager - [phone] After 6pm go to [email or web address] - Social research officer, government  [facility]  Office  [phone]  CC: Primary care physician; Center, [facility]

## 2017-10-20 NOTE — Progress Notes (Signed)
Dialysis treatment ended. 

## 2017-10-20 NOTE — Progress Notes (Signed)
Pre dialysis assessment 

## 2017-10-20 NOTE — [place] FL2 (Signed)
[place] MEDICAID FL2 LEVEL OF CARE SCREENING TOOL     IDENTIFICATION  Patient Name: Ann Cunningham Birthdate: 1941-01-21 Sex: female Admission Date (Current Location): 10/19/2017  [place]  [name] [id number] Q Facility and Address:  [facility], [address], [place], [place] [address]      Provider Number: [id number]  Attending Physician Name and Address:  Enedina FinnerPatel, Sona, MD  Relative Name and Phone Number:  Marzetta MerinoLinda Watlington (Niece) [phone]    Current Level of Care: Hospital Recommended Level of Care: Skilled Nursing Facility Prior Approval Number:    Date Approved/Denied:   PASRR Number: [id number]  Discharge Plan: SNF    Current Diagnoses: Patient Active Problem List   Diagnosis Date Noted  . Protein-calorie malnutrition, severe 10/20/2017  . Encephalopathy 10/19/2017  . ESRD on dialysis (HCC) 09/12/2017  . Essential hypertension 09/12/2017  . Failure to thrive in adult 08/17/2017  . Dyspnea 08/14/2017    Orientation RESPIRATION BLADDER Height & Weight     Self, Time, Situation, Place  Normal Continent Weight: 132 lb 15 oz (60.3 kg) Height:  5\' 2"  (157.5 cm)  BEHAVIORAL SYMPTOMS/MOOD NEUROLOGICAL BOWEL NUTRITION STATUS      Continent Diet(Renal diet, fluid restriction 1200 mL)  AMBULATORY STATUS COMMUNICATION OF NEEDS Skin   Extensive Assist Verbally Surgical wounds(AV fistula)                       Personal Care Assistance Level of Assistance  Bathing, Feeding, Dressing Bathing Assistance: Limited assistance Feeding assistance: Independent Dressing Assistance: Limited assistance     Functional Limitations Info             SPECIAL CARE FACTORS FREQUENCY  PT (By licensed PT)     PT Frequency: Resume any previously ordered PT              Contractures Contractures Info: Not present    Additional Factors Info  Code Status, Allergies Code Status Info: Full Allergies Info:  Penicillins           Current Medications (10/20/2017):  This is the current hospital active medication list Current Facility-Administered Medications  Medication Dose Route Frequency Provider Last Rate Last Dose  . acetaminophen (TYLENOL) tablet 650 mg  650 mg Oral Q6H PRN Adrian SaranMody, Sital, MD       Or  . acetaminophen (TYLENOL) suppository 650 mg  650 mg Rectal Q6H PRN Mody, Sital, MD      . aspirin chewable tablet 81 mg  81 mg Oral Daily Mody, Sital, MD   81 mg at 10/19/17 1449  . bisoprolol (ZEBETA) tablet 5 mg  5 mg Oral QHS Mody, Sital, MD      . brimonidine (ALPHAGAN) 0.2 % ophthalmic solution 1 drop  1 drop Both Eyes Daily PRN Mody, Sital, MD      . brinzolamide (AZOPT) 1 % ophthalmic suspension 1 drop  1 drop Both Eyes QHS Adrian SaranMody, Sital, MD   1 drop at 10/19/17 2146  . epoetin alfa (EPOGEN,PROCRIT) injection 10,000 Units  10,000 Units Intravenous Q T,Th,Sa-HD Lateef, Munsoor, MD      . feeding supplement (NEPRO CARB STEADY) liquid 237 mL  237 mL Oral BID BM Mody, Sital, MD   237 mL at 10/19/17 1527  . furosemide (LASIX) tablet 80 mg  80 mg Oral Daily Mody, Sital, MD   80 mg at 10/19/17 1449  . heparin injection 5,000 Units  5,000 Units Subcutaneous Q8H Adrian SaranMody, Sital, MD   5,000  Units at 10/19/17 2145  . levothyroxine (SYNTHROID, LEVOTHROID) tablet 25 mcg  25 mcg Oral QAC breakfast Adrian Saran, MD   Stopped at 10/19/17 1323  . MEDLINE mouth rinse  15 mL Mouth Rinse BID Adrian Saran, MD   15 mL at 10/19/17 2146  . midodrine (PROAMATINE) tablet 5 mg  5 mg Oral Once per day on Mon Wed Fri Mody, Patricia Pesa, MD   5 mg at 10/19/17 1449  . multivitamin (RENA-VIT) tablet 1 tablet  1 tablet Oral QHS Adrian Saran, MD   1 tablet at 10/19/17 2145  . ondansetron (ZOFRAN) tablet 4 mg  4 mg Oral Q6H PRN Mody, Sital, MD       Or  . ondansetron (ZOFRAN) injection 4 mg  4 mg Intravenous Q6H PRN Mody, Sital, MD      . polyethylene glycol (MIRALAX / GLYCOLAX) packet 17 g  17 g Oral Daily PRN Adrian Saran, MD          Discharge Medications: Please see discharge summary for a list of discharge medications.  Relevant Imaging Results:  Relevant Lab Results:   Additional Information SS#[id number]  Judi Cong, LCSW

## 2017-10-20 NOTE — Progress Notes (Signed)
Post dialysis assessment 

## 2017-10-20 NOTE — Progress Notes (Signed)
Pharmacy Electrolyte Monitoring Consult:  Pharmacy consulted to assist in monitoring and replacing electrolytes in this 77 y.o. female admitted on 10/19/2017 with No chief complaint on file. Hemodialysis patient here for AV fistula placement with acute encephalopathy after procedure.  Labs:  Sodium (mmol/L)  Date Value  10/20/2017 136   Potassium (mmol/L)  Date Value  10/20/2017 3.4 (L)   Magnesium (mg/dL)  Date Value  16/10/960401/25/2019 2.1   Phosphorus (mg/dL)  Date Value  54/09/811901/26/2019 2.5   Calcium (mg/dL)  Date Value  14/78/295601/26/2019 8.1 (L)   Albumin (g/dL)  Date Value  21/30/865701/25/2019 2.9 (L)    Plan: K 2.7,  Mag 1.7 Patient has Lasix 80 mg po ordered. Will order KCL IV 10 meq x 3 (30 meq total) as patient not awake enough for oral medication. This is conservative dosing due to Hemodialysis patient. Will order Magnesium sulfate 1 gram IV x 1. Will recheck K+ at 2000. F/u labs in am.  Addendum:  RN called and patient now awake to take PO and would like to switch from IV KCL to PO. Patient received KCL 10 meq IV x1. Will change to KCL 40 MEQ PO x 1. Conservative dosing for Hemodialysis pt. Recheck Potassium at 2000.  1/25:  K @ 20:10 = 4.1 .  No additional K supplementation needed.  Will recheck electrolytes on 1/25 with AM labs.   1/26: HD pt with HD orders in for today.  K borderline at 3.4. Will sign off as nephrology managing.   Marty HeckWang, Aneesa Romey L 10/20/2017 8:10 AM

## 2017-10-20 NOTE — Clinical Social Work Note (Signed)
Clinical Social Work Assessment  Patient Details  Name: Ann Cunningham MRN: [id number] Date of Birth: Oct 10, 1940  Date of referral:  10/20/17               Reason for consult:  Facility Placement                Permission souDorthy Coolerght to share information with:  Facility Industrial/product designerContact Representative Permission granted to share information::  Yes, Verbal Permission Granted  Name::        Agency::     Relationship::     Contact Information:     Housing/Transportation Living arrangements for the past 2 months:  Skilled Nursing Facility Source of Information:  Patient, Medical Team, Facility Patient Interpreter Needed:  None Criminal Activity/Legal Involvement Pertinent to Current Situation/Hospitalization:  No - Comment as needed Significant Relationships:  Merchandiser, retailCommunity Support, Warehouse managerChurch, Other Family Members Lives with:  Facility Resident Do you feel safe going back to the place where you live?  Yes Need for family participation in patient care:  No (Coment)  Care giving concerns:  Patient admitted from [facility]   Social Worker assessment / plan:  CSW attempted to meet with the patient at bedside to discuss discharge planning for today's discharge. The patient was at HD and unable to complete assessment. CSW attempted to contact the patient's nieces by telephone with no ability to leave voicemail. CSW contacted the facility who confirmed that the patient can return today. According to the chart, the patient had planned to return to [facility] with hopes of eventual return home. However, she is currently a LTC resident due to continued medical needs.   The CSW has prepared the discharge packet, and the facility has received all documentation. The patient will discharge by non-emergent EMS due to unsafe to travel secondary to mobility limitations and weakness. The patient will discharge once HD is completed, and she is deemed stable for such post-HD. CSW is signing off.  Please consult should additional needs arise.  Employment status:  Disabled (Comment on whether or not currently receiving Disability), Retired Database administratornsurance information:  Managed Medicare, [place] PT Recommendations:  Not assessed at this time Information / Referral to community resources:     Patient/Family's Response to care:  The patient was not available.  Patient/Family's Understanding of and Emotional Response to Diagnosis, Current Treatment, and Prognosis: According to the chart, the patient understands the discharge plan and is in agreement.  Emotional Assessment Appearance:  Appears stated age Attitude/Demeanor/Rapport:  (In HD; not able to assess) Affect (typically observed):  (In HD; not able to assess) Orientation:  (In HD; not able to assess/baseline A&O X3) Alcohol / Substance use:  Never Used Psych involvement (Current and /or in the community):  No (Comment)  Discharge Needs  Concerns to be addressed:  Care Coordination, Discharge Planning Concerns Readmission within the last 30 days:  Yes Current discharge risk:  None Barriers to Discharge:  No Barriers Identified   Judi CongKaren M Lylee Corrow, LCSW 10/20/2017, 1:39 PM

## 2017-10-20 NOTE — Progress Notes (Signed)
[facility]. Ambulance to arrive shortly.

## 2017-10-20 NOTE — Progress Notes (Signed)
[facility]  ROUNDING NOTE   Subjective:  Patient had fistula placements yesterday. After surgery she was somewhat lethargic. Today she is much better. She is due for hemodialysis today.   Objective:  Vital signs in last 24 hours:  Temp:  [96.8 F (36 C)-97.8 F (36.6 C)] 97.8 F (36.6 C) (01/26 0423) Pulse Rate:  [72-114] 105 (01/26 1100) Resp:  [0-28] 17 (01/26 1100) BP: (83-147)/(50-70) 89/50 (01/26 1100) SpO2:  [97 %-100 %] 97 % (01/26 1100) Weight:  [60.3 kg (132 lb 15 oz)-60.9 kg (134 lb 4.2 oz)] 60.3 kg (132 lb 15 oz) (01/26 1015)  Weight change:  Filed Weights   10/19/17 1303 10/20/17 1015  Weight: 60.9 kg (134 lb 4.2 oz) 60.3 kg (132 lb 15 oz)    Intake/Output: I/O last 3 completed shifts: In: 490 [P.O.:240; I.V.:250] Out: 10 [Blood:10]   Intake/Output this shift:  No intake/output data recorded.  Physical Exam: General: No acute distress  Head: Normocephalic, atraumatic. Moist oral mucosal membranes  Eyes: Anicteric  Neck: Supple, trachea midline  Lungs:  Clear to auscultation, normal effort  Heart: S1S2 no rubs  Abdomen:  Soft, nontender, bowel sounds present  Extremities: No peripheral edema.  Neurologic: Awake, alert, following commands  Skin: No lesions  Access: R IJ permcath    Basic Metabolic Panel: Recent Labs  Lab 10/15/17 1109 10/19/17 0640 10/19/17 1225 10/19/17 1516 10/19/17 2010 10/20/17 0455  NA 131* 138 136  --   --  136  K 3.1* 3.9 2.7*  --  4.1 3.4*  CL 97*  --  97*  --   --  98*  CO2 23  --  31  --   --  29  GLUCOSE 109* 94 126*  --   --  103*  BUN 32*  --  30*  --   --  39*  CREATININE 2.69*  --  1.76* 1.78*  --  2.19*  CALCIUM 8.6*  --  8.1*  --   --  8.1*  MG  --   --  1.7 2.1  --   --   PHOS  --   --  2.4*  --   --  2.5    Liver Function Tests: Recent Labs  Lab 10/19/17 1225  ALBUMIN 2.9*   No results for input(s): LIPASE, AMYLASE in the last 168 hours. No results for input(s): AMMONIA in the  last 168 hours.  CBC: Recent Labs  Lab 10/15/17 1109 10/19/17 0640 10/19/17 1516 10/20/17 0455  WBC 6.4  --  9.7 7.8  NEUTROABS 4.3  --   --   --   HGB 10.2* 10.2* 9.4* 8.5*  HCT 31.3* 30.0* 29.1* 26.1*  MCV 87.7  --  88.2 87.0  PLT 382  --  396 396    Cardiac Enzymes: No results for input(s): CKTOTAL, CKMB, CKMBINDEX, TROPONINI in the last 168 hours.  BNP: Invalid input(s): POCBNP  CBG: No results for input(s): GLUCAP in the last 168 hours.  Microbiology: Results for orders placed or performed during the hospital encounter of 10/19/17  MRSA PCR Screening     Status: None   Collection Time: 10/19/17  1:50 PM  Result Value Ref Range Status   MRSA by PCR NEGATIVE NEGATIVE Final    Comment:        The GeneXpert MRSA Assay (FDA approved for NASAL specimens only), is one component of a comprehensive MRSA colonization surveillance program. It is not intended to diagnose MRSA infection nor  to guide or monitor treatment for MRSA infections. Performed at [facility], [address] [place], [place] [address]     Coagulation Studies: No results for input(s): LABPROT, INR in the last 72 hours.  Urinalysis: No results for input(s): COLORURINE, LABSPEC, PHURINE, GLUCOSEU, HGBUR, BILIRUBINUR, KETONESUR, PROTEINUR, UROBILINOGEN, NITRITE, LEUKOCYTESUR in the last 72 hours.  Invalid input(s): APPERANCEUR    Imaging: No results found.   Medications:    . aspirin  81 mg Oral Daily  . bisoprolol  5 mg Oral QHS  . brinzolamide  1 drop Both Eyes QHS  . feeding supplement (NEPRO CARB STEADY)  237 mL Oral BID BM  . furosemide  80 mg Oral Daily  . heparin  5,000 Units Subcutaneous Q8H  . levothyroxine  25 mcg Oral QAC breakfast  . mouth rinse  15 mL Mouth Rinse BID  . midodrine  5 mg Oral Once per day on Mon Wed Fri  . multivitamin  1 tablet Oral QHS   acetaminophen **OR** acetaminophen, brimonidine, ondansetron **OR** ondansetron (ZOFRAN) IV, polyethylene  glycol  Assessment/ Plan:  77 y.o. female with end stage renal disease on HD TTHS, congestive heart failure, hypertension, anemia, secondary hyperparathyroidism  [facility]   1. ESRD on HD TTHS: She had fistula placement yesterday.  After surgery she was somewhat lethargic which is most likely secondary to anesthesia.  She is much more awake and alert today.  She will be undergoing hemodialysis today.  2.  Anemia of chronic kidney disease.  Hemoglobin currently 8.5.  Start the patient on Epogen.  3.  Secondary hyperparathyroidism.  Check phosphorus with dialysis today.  4.  Thanks for consultation.     LOS: 1 Tejasvi Brissett 1/26/201911:37 AM

## 2017-10-20 NOTE — Progress Notes (Signed)
PT Cancellation Note  Patient Details Name: Ann CoolerWilma Cunningham MRN: [id number] DOB: 1941-05-27   Cancelled Treatment:    Reason Eval/Treat Not Completed: Patient at procedure or test/unavailable(Consult received and chart reviewed.  Patient currently leaving unit for dialysis.  Will re-attempt at later time/date as patient medically appropriate and available.)  Of note, patient reports currently residing at [facility], using manual WC as primary mobility; performs stand/squat pivot transfers to/from seating surfaces with assist as needed.  Patient unsure if participating with therapy prior to admission; hopes to return home long-term.  [place] Moss H. Manson PasseyBrown, PT, DPT, NCS 10/20/17, 9:54 AM [phone]

## 2017-10-20 NOTE — Progress Notes (Signed)
Dialysis treatment started. 

## 2017-10-20 NOTE — Progress Notes (Signed)
Ambulance here to take pt back to facility. Ambulance can't take pt's wheelchir. I called and spoke w/niece, Marzetta MerinoLinda Watlington @ [phone]. Bonita QuinLinda told me that Godfrey PickJoel Watlington will come get her wc sometime tonight. It will be at the Nurses station w/pt's name on it.

## 2017-11-28 ENCOUNTER — Encounter (INDEPENDENT_AMBULATORY_CARE_PROVIDER_SITE_OTHER): Payer: Medicare Other | Admitting: Vascular Surgery

## 2017-12-26 ENCOUNTER — Encounter (INDEPENDENT_AMBULATORY_CARE_PROVIDER_SITE_OTHER): Payer: Self-pay

## 2018-01-07 ENCOUNTER — Other Ambulatory Visit (INDEPENDENT_AMBULATORY_CARE_PROVIDER_SITE_OTHER): Payer: Self-pay | Admitting: Vascular Surgery

## 2018-01-10 ENCOUNTER — Encounter: Payer: Self-pay | Admitting: Emergency Medicine

## 2018-01-10 ENCOUNTER — Emergency Department: Payer: Medicare Other

## 2018-01-10 ENCOUNTER — Other Ambulatory Visit: Payer: Self-pay

## 2018-01-10 ENCOUNTER — Inpatient Hospital Stay
Admission: EM | Admit: 2018-01-10 | Discharge: 2018-01-12 | DRG: 291 | Disposition: A | Discharge status: Skilled Nursing Facility | Payer: Medicare Other | Attending: Specialist | Admitting: Specialist

## 2018-01-10 DIAGNOSIS — I132 Hypertensive heart and chronic kidney disease with heart failure and with stage 5 chronic kidney disease, or end stage renal disease: Principal | ICD-10-CM | POA: Diagnosis present

## 2018-01-10 DIAGNOSIS — R531 Weakness: Secondary | ICD-10-CM

## 2018-01-10 DIAGNOSIS — Z87891 Personal history of nicotine dependence: Secondary | ICD-10-CM | POA: Diagnosis not present

## 2018-01-10 DIAGNOSIS — Z8249 Family history of ischemic heart disease and other diseases of the circulatory system: Secondary | ICD-10-CM

## 2018-01-10 DIAGNOSIS — N186 End stage renal disease: Secondary | ICD-10-CM | POA: Diagnosis present

## 2018-01-10 DIAGNOSIS — Z823 Family history of stroke: Secondary | ICD-10-CM

## 2018-01-10 DIAGNOSIS — I824Z2 Acute embolism and thrombosis of unspecified deep veins of left distal lower extremity: Secondary | ICD-10-CM | POA: Diagnosis present

## 2018-01-10 DIAGNOSIS — X088XXD Exposure to other specified smoke, fire and flames, subsequent encounter: Secondary | ICD-10-CM

## 2018-01-10 DIAGNOSIS — H409 Unspecified glaucoma: Secondary | ICD-10-CM | POA: Diagnosis not present

## 2018-01-10 DIAGNOSIS — L89302 Pressure ulcer of unspecified buttock, stage 2: Secondary | ICD-10-CM | POA: Diagnosis present

## 2018-01-10 DIAGNOSIS — N2581 Secondary hyperparathyroidism of renal origin: Secondary | ICD-10-CM | POA: Diagnosis not present

## 2018-01-10 DIAGNOSIS — D631 Anemia in chronic kidney disease: Secondary | ICD-10-CM | POA: Diagnosis not present

## 2018-01-10 DIAGNOSIS — Z880 Allergy status to penicillin: Secondary | ICD-10-CM | POA: Diagnosis not present

## 2018-01-10 DIAGNOSIS — I5023 Acute on chronic systolic (congestive) heart failure: Secondary | ICD-10-CM | POA: Diagnosis not present

## 2018-01-10 DIAGNOSIS — I959 Hypotension, unspecified: Secondary | ICD-10-CM | POA: Diagnosis present

## 2018-01-10 DIAGNOSIS — I9589 Other hypotension: Secondary | ICD-10-CM | POA: Diagnosis not present

## 2018-01-10 DIAGNOSIS — M7989 Other specified soft tissue disorders: Secondary | ICD-10-CM

## 2018-01-10 DIAGNOSIS — J90 Pleural effusion, not elsewhere classified: Secondary | ICD-10-CM

## 2018-01-10 DIAGNOSIS — T22312D Burn of third degree of left forearm, subsequent encounter: Secondary | ICD-10-CM

## 2018-01-10 DIAGNOSIS — E039 Hypothyroidism, unspecified: Secondary | ICD-10-CM | POA: Diagnosis not present

## 2018-01-10 DIAGNOSIS — I953 Hypotension of hemodialysis: Secondary | ICD-10-CM | POA: Diagnosis not present

## 2018-01-10 DIAGNOSIS — Z992 Dependence on renal dialysis: Secondary | ICD-10-CM | POA: Diagnosis not present

## 2018-01-10 LAB — CBC
HCT: 36.5 % (ref 35.0–47.0)
Hemoglobin: 11.7 g/dL — ABNORMAL LOW (ref 12.0–16.0)
MCH: 27.5 pg (ref 26.0–34.0)
MCHC: 31.9 g/dL — ABNORMAL LOW (ref 32.0–36.0)
MCV: 86 fL (ref 80.0–100.0)
Platelets: 305 10*3/uL (ref 150–440)
RBC: 4.24 MIL/uL (ref 3.80–5.20)
RDW: 17.1 % — AB (ref 11.5–14.5)
WBC: 7.7 10*3/uL (ref 3.6–11.0)

## 2018-01-10 LAB — BASIC METABOLIC PANEL
Anion gap: 10 (ref 5–15)
BUN: 39 mg/dL — AB (ref 6–20)
CO2: 25 mmol/L (ref 22–32)
CREATININE: 2.45 mg/dL — AB (ref 0.44–1.00)
Calcium: 8.2 mg/dL — ABNORMAL LOW (ref 8.9–10.3)
Chloride: 94 mmol/L — ABNORMAL LOW (ref 101–111)
GFR, EST AFRICAN AMERICAN: 21 mL/min — AB (ref >60)
GFR, EST NON AFRICAN AMERICAN: 18 mL/min — AB (ref >60)
Glucose, Bld: 85 mg/dL (ref 65–99)
POTASSIUM: 3.5 mmol/L (ref 3.5–5.1)
SODIUM: 129 mmol/L — AB (ref 135–145)

## 2018-01-10 LAB — TROPONIN I: Troponin I: <0.03 ng/mL (ref <0.03)

## 2018-01-10 MED ORDER — BRIMONIDINE TARTRATE 0.2 % OP SOLN
1.0000 [drp] | Freq: Every day | OPHTHALMIC | Status: DC | PRN
  Administered 2018-01-12: 1 [drp] via OPHTHALMIC
  Filled 2018-01-10: qty 5

## 2018-01-10 MED ORDER — LEVOTHYROXINE SODIUM 50 MCG PO TABS
25.0000 ug | ORAL_TABLET | Freq: Every day | ORAL | Status: DC
  Administered 2018-01-11 – 2018-01-12 (×2): 25 ug via ORAL
  Filled 2018-01-10 (×2): qty 1

## 2018-01-10 MED ORDER — FUROSEMIDE 40 MG PO TABS
80.0000 mg | ORAL_TABLET | Freq: Every day | ORAL | Status: DC
  Administered 2018-01-11: 80 mg via ORAL
  Filled 2018-01-10: qty 2

## 2018-01-10 MED ORDER — HEPARIN SODIUM (PORCINE) 5000 UNIT/ML IJ SOLN
5000.0000 [IU] | Freq: Three times a day (TID) | INTRAMUSCULAR | Status: DC
  Administered 2018-01-10 – 2018-01-12 (×4): 5000 [IU] via SUBCUTANEOUS
  Filled 2018-01-10 (×4): qty 1

## 2018-01-10 MED ORDER — ZINC OXIDE 20 % EX OINT
1.0000 "application " | TOPICAL_OINTMENT | CUTANEOUS | Status: DC | PRN
  Filled 2018-01-10: qty 28.35

## 2018-01-10 MED ORDER — BRINZOLAMIDE 1 % OP SUSP
1.0000 [drp] | Freq: Every day | OPHTHALMIC | Status: DC
  Filled 2018-01-10: qty 10

## 2018-01-10 MED ORDER — ACETAMINOPHEN 325 MG PO TABS
650.0000 mg | ORAL_TABLET | Freq: Four times a day (QID) | ORAL | Status: DC | PRN
  Administered 2018-01-10 – 2018-01-11 (×3): 650 mg via ORAL
  Filled 2018-01-10 (×3): qty 2

## 2018-01-10 MED ORDER — ACETAMINOPHEN 325 MG PO TABS: 650.0000 mg | ORAL_TABLET | Freq: Four times a day (QID) | ORAL | Status: DC | PRN

## 2018-01-10 MED ORDER — ACETAMINOPHEN 650 MG RE SUPP: 650.0000 mg | Freq: Four times a day (QID) | RECTAL | Status: DC | PRN

## 2018-01-10 MED ORDER — MIDODRINE HCL 5 MG PO TABS
5.0000 mg | ORAL_TABLET | ORAL | Status: DC
Start: 2018-01-12 — End: 2018-01-13
  Administered 2018-01-12: 5 mg via ORAL
  Filled 2018-01-10: qty 1

## 2018-01-10 MED ORDER — DICLOFENAC SODIUM 1 % TD GEL
2.0000 g | Freq: Four times a day (QID) | TRANSDERMAL | Status: DC
  Administered 2018-01-10 – 2018-01-12 (×6): 2 g via TOPICAL
  Filled 2018-01-10: qty 100

## 2018-01-10 MED ORDER — MIDODRINE HCL 5 MG PO TABS: 5.0000 mg | ORAL_TABLET | ORAL | Status: DC

## 2018-01-10 MED ORDER — ASPIRIN EC 81 MG PO TBEC
81.0000 mg | DELAYED_RELEASE_TABLET | Freq: Every day | ORAL | Status: DC
  Administered 2018-01-11 – 2018-01-12 (×2): 81 mg via ORAL
  Filled 2018-01-10 (×2): qty 1

## 2018-01-10 MED ORDER — HYDROCODONE-ACETAMINOPHEN 5-325 MG PO TABS: 1.0000 | ORAL_TABLET | Freq: Four times a day (QID) | ORAL | Status: DC | PRN

## 2018-01-10 MED ORDER — POLYETHYLENE GLYCOL 3350 17 G PO PACK
17.0000 g | PACK | Freq: Every day | ORAL | Status: DC
  Administered 2018-01-10 – 2018-01-12 (×3): 17 g via ORAL
  Filled 2018-01-10 (×3): qty 1

## 2018-01-10 MED ORDER — SILVER SULFADIAZINE 1 % EX CREA: TOPICAL_CREAM | Freq: Every day | CUTANEOUS | Status: DC

## 2018-01-10 MED ORDER — RENA-VITE PO TABS
1.0000 | ORAL_TABLET | Freq: Every day | ORAL | Status: DC
  Administered 2018-01-10 – 2018-01-11 (×2): 1 via ORAL
  Filled 2018-01-10 (×2): qty 1

## 2018-01-10 NOTE — ED Triage Notes (Signed)
Pt brought in via [facility] with complaints of hypotension, she had 27 minutes of dialysis left and her B/P dropped into the 80's they gave her a liter of fluid and her B/P was back to 120's over 70's. EMS reports that her B/P was 150's over 70's for them. She gets dialysis every Tues,Thur, Sat. She did not get dialysis on Tues because she had a virus.
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
## 2018-01-10 NOTE — ED Notes (Signed)
Admitting MD at bedside.

## 2018-01-10 NOTE — ED Provider Notes (Signed)
[facility] Emergency Department Provider Note       Time seen: ----------------------------------------- 1:43 PM on 01/10/2018 -----------------------------------------   I have reviewed the triage vital signs and the nursing notes.  HISTORY   Chief Complaint Hypotension and Weakness    HPI Ann Cunningham is a 77 y.o. female with a history of anemia, CHF, CKD, hypertension, dialysis who presents to the ED for hypotension.  Reportedly she had 27 minutes of dialysis left and her blood pressure dropped into the 80s so she was given a liter of fluid and her blood pressure was normalized.  She typically gets dialysis Tuesday, Thursday and Saturday but she did not have dialysis on Tuesday because she had diarrhea.  She denies fevers, chills but does have shortness of breath.  Past Medical History:  Diagnosis Date  . Anemia   . Cardiomyopathy (HCC)   . CHF (congestive heart failure) (HCC)   . CKD (chronic kidney disease)   . Glaucoma   . Hypertension   . Hypothyroidism   . Pressure ulcer of sacrum   . Renal dialysis device, implant, or graft complication   . Renal disorder   . Uses walker     Patient Active Problem List   Diagnosis Date Noted  . Protein-calorie malnutrition, severe 10/20/2017  . Encephalopathy 10/19/2017  . ESRD on dialysis (HCC) 09/12/2017  . Essential hypertension 09/12/2017  . Failure to thrive in adult 08/17/2017  . Dyspnea 08/14/2017    Past Surgical History:  Procedure Laterality Date  . BASCILIC VEIN TRANSPOSITION Right 10/19/2017   Procedure: Radiocephalic fistula creation;  Surgeon: Renford DillsSchnier, Gregory G, MD;  Location: [facility] ORS;  Service: Vascular;  Laterality: Right;  . CAPD INSERTION N/A 08/15/2017   Procedure: LAPAROSCOPIC INSERTION CONTINUOUS AMBULATORY PERITONEAL DIALYSIS  (CAPD) CATHETER;  Surgeon: Renford DillsSchnier, Gregory G, MD;  Location: [facility] ORS;  Service: Vascular;  Laterality: N/A;  . CATARACT EXTRACTION W/ INTRAOCULAR LENS  IMPLANT    . CATARACT EXTRACTION W/PHACO Right 12/25/2016   Procedure: CATARACT EXTRACTION PHACO AND INTRAOCULAR LENS PLACEMENT (IOC)  Right complicated;  Surgeon: Sherald HessAnita Prakash Vin-Parikh, MD;  [facility];  Service: Ophthalmology;  Laterality: Right;  Vision Blue  . DIALYSIS/PERMA CATHETER INSERTION N/A 08/20/2017   Procedure: DIALYSIS/PERMA CATHETER INSERTION;  Surgeon: Annice Needyew, Jason S, MD;  Location: [facility] INVASIVE CV LAB;  Service: Cardiovascular;  Laterality: N/A;  . REMOVAL OF A DIALYSIS CATHETER N/A 10/19/2017   Procedure: REMOVAL OF A DIALYSIS CATHETER;  Surgeon: Renford DillsSchnier, Gregory G, MD;  Location: [facility] ORS;  Service: Vascular;  Laterality: N/A;    Allergies Penicillins  Social History Social History   Tobacco Use  . Smoking status: Former Smoker    Packs/day: 0.50    Types: Cigarettes    Last attempt to quit: 06/14/2017    Years since quitting: 0.5  . Smokeless tobacco: Former NeurosurgeonUser    Types: Snuff  Substance Use Topics  . Alcohol use: No  . Drug use: No   Review of Systems Constitutional: Negative for fever. Cardiovascular: Negative for chest pain. Respiratory: Positive for shortness of breath Gastrointestinal: Negative for abdominal pain, positive for diarrhea Musculoskeletal: Negative for back pain. Skin: Negative for rash. Neurological: Positive for weakness  All systems negative/normal/unremarkable except as stated in the HPI  ____________________________________________   PHYSICAL EXAM:  VITAL SIGNS: ED Triage Vitals  Enc Vitals Group     BP 01/10/18 1315 (!) 113/59     Pulse Rate 01/10/18 1315 100     Resp 01/10/18  1315 20     Temp 01/10/18 1315 (!) 97.5 F (36.4 C)     Temp Source 01/10/18 1315 Oral     SpO2 01/10/18 1315 98 %     Weight --      Height 01/10/18 1316 5\' 2"  (1.575 m)     Head Circumference --      Peak Flow --      Pain Score 01/10/18 1316 0     Pain Loc --      Pain Edu? --      Excl. in GC? --    Constitutional:  Alert and oriented.  Chronically ill-appearing, mild to moderate distress Eyes: Conjunctivae are normal. Normal extraocular movements. ENT   Head: Normocephalic and atraumatic.   Nose: No congestion/rhinnorhea.   Mouth/Throat: Mucous membranes are moist.   Neck: No stridor. Cardiovascular: Normal rate, regular rhythm. No murmurs, rubs, or gallops. Respiratory: Tachypnea Gastrointestinal: Soft and nontender. Normal bowel sounds Musculoskeletal: Nontender with normal range of motion in extremities. No lower extremity tenderness nor edema. Neurologic:  Normal speech and language.  Generalized weakness, nothing focal Skin:  Skin is warm, dry and intact. No rash noted. Psychiatric: Mood and affect are normal. Speech and behavior are normal.  ____________________________________________  EKG: Interpreted by me.  Sinus rhythm with PVCs, rate is 98 bpm, left axis deviation, low voltage QRS, long QT  ____________________________________________  ED COURSE:  As part of my medical decision making, I reviewed the following data within the electronic MEDICAL RECORD NUMBER History obtained from family if available, nursing notes, old chart and ekg, as well as notes from prior ED visits. Patient presented for hypotension and dyspnea, we will assess with labs and imaging as indicated at this time.   Procedures ____________________________________________   LABS (pertinent positives/negatives)  Labs Reviewed  BASIC METABOLIC PANEL - Abnormal; Notable for the following components:      Result Value   Sodium 129 (*)    Chloride 94 (*)    BUN 39 (*)    Creatinine, Ser 2.45 (*)    Calcium 8.2 (*)    GFR calc non Af Amer 18 (*)    GFR calc Af Amer 21 (*)    All other components within normal limits  CBC - Abnormal; Notable for the following components:   Hemoglobin 11.7 (*)    MCHC 31.9 (*)    RDW 17.1 (*)    All other components within normal limits  TROPONIN I  URINALYSIS, COMPLETE  (UACMP) WITH MICROSCOPIC  CBG MONITORING, ED    RADIOLOGY Images were viewed by me  Chest x-ray IMPRESSION: Cardiomegaly with congestive heart failure/fluid overload. Worsening aeration from priors.  ____________________________________________  DIFFERENTIAL DIAGNOSIS   Dehydration, electrolyte abnormality, renal failure, uremia, MI, PE, sepsis  FINAL ASSESSMENT AND PLAN  Hypotension, end-stage renal disease on dialysis, pleural effusions   Plan: The patient had presented for low blood pressure during dialysis today. Patient's labs were remarkably within normal limits for her. Patient's imaging did reveal cardiomegaly with bilateral pleural effusions and volume overload.  I will discuss with nephrology, patient does not appear well and may need pleurocentesis as well as repeat dialysis.   Ulice Dash, MD   Note: This note was generated in part or whole with voice recognition software. Voice recognition is usually quite accurate but there are transcription errors that can and very often do occur. I apologize for any typographical errors that were not detected and corrected.     Daryel November  E, MD 01/10/18 1442

## 2018-01-10 NOTE — ED Notes (Signed)
Niece Bonita QuinLinda cell # [phone].

## 2018-01-11 ENCOUNTER — Inpatient Hospital Stay: Payer: Medicare Other

## 2018-01-11 DIAGNOSIS — R531 Weakness: Secondary | ICD-10-CM | POA: Diagnosis not present

## 2018-01-11 DIAGNOSIS — I132 Hypertensive heart and chronic kidney disease with heart failure and with stage 5 chronic kidney disease, or end stage renal disease: Secondary | ICD-10-CM | POA: Diagnosis not present

## 2018-01-11 LAB — BASIC METABOLIC PANEL
ANION GAP: 6 (ref 5–15)
BUN: 43 mg/dL — AB (ref 6–20)
CO2: 27 mmol/L (ref 22–32)
Calcium: 8 mg/dL — ABNORMAL LOW (ref 8.9–10.3)
Chloride: 95 mmol/L — ABNORMAL LOW (ref 101–111)
Creatinine, Ser: 2.72 mg/dL — ABNORMAL HIGH (ref 0.44–1.00)
GFR calc Af Amer: 18 mL/min — ABNORMAL LOW (ref >60)
GFR calc non Af Amer: 16 mL/min — ABNORMAL LOW (ref >60)
GLUCOSE: 101 mg/dL — AB (ref 65–99)
POTASSIUM: 4 mmol/L (ref 3.5–5.1)
Sodium: 128 mmol/L — ABNORMAL LOW (ref 135–145)

## 2018-01-11 LAB — CBC
HEMATOCRIT: 34.2 % — AB (ref 35.0–47.0)
HEMOGLOBIN: 11.2 g/dL — AB (ref 12.0–16.0)
MCH: 28.1 pg (ref 26.0–34.0)
MCHC: 32.9 g/dL (ref 32.0–36.0)
MCV: 85.4 fL (ref 80.0–100.0)
Platelets: 295 10*3/uL (ref 150–440)
RBC: 4 MIL/uL (ref 3.80–5.20)
RDW: 16.8 % — ABNORMAL HIGH (ref 11.5–14.5)
WBC: 5.1 10*3/uL (ref 3.6–11.0)

## 2018-01-11 LAB — MRSA PCR SCREENING: MRSA BY PCR: NEGATIVE

## 2018-01-11 LAB — PHOSPHORUS: PHOSPHORUS: 4.2 mg/dL (ref 2.5–4.6)

## 2018-01-11 MED ORDER — HYDROCODONE-ACETAMINOPHEN 5-325 MG PO TABS: 1.0000 | ORAL_TABLET | Freq: Four times a day (QID) | ORAL | 0 refills | Status: AC | PRN

## 2018-01-11 NOTE — Progress Notes (Signed)
[employer] - [place] at [facility]   PATIENT NAME: Ann Cunningham    MR#:  [id number]  DATE OF BIRTH:  [birth year]  SUBJECTIVE:   She is here due to an episode of weakness/hypotension after hemodialysis yesterday.  Patient to get an extra hemodialysis session today.  Blood pressures have improved.  REVIEW OF SYSTEMS:    Review of Systems  Constitutional: Negative for chills and fever.  HENT: Negative for congestion and tinnitus.   Eyes: Negative for blurred vision and double vision.  Respiratory: Negative for cough, shortness of breath and wheezing.   Cardiovascular: Negative for chest pain, orthopnea and PND.  Gastrointestinal: Negative for abdominal pain, diarrhea, nausea and vomiting.  Genitourinary: Negative for dysuria and hematuria.  Neurological: Negative for dizziness, sensory change and focal weakness.  All other systems reviewed and are negative.   Nutrition: Renal diet Tolerating Diet: Yes Tolerating PT: Pt. Is bedbound at baseline.    DRUG ALLERGIES:   Allergies  Allergen Reactions  . Penicillins Other (See Comments)    Has patient had a PCN reaction causing immediate rash, facial/tongue/throat swelling, SOB or lightheadedness with hypotension: Unknown Has patient had a PCN reaction causing severe rash involving mucus membranes or skin necrosis: Unknown Has patient had a PCN reaction that required hospitalization: Unknown Has patient had a PCN reaction occurring within the last 10 years: Unknown If all of the above answers are "NO", then may proceed with Cephalosporin use.     VITALS:  Blood pressure 110/70, pulse 92, temperature 98.3 F (36.8 C), temperature source Oral, resp. rate 17, height 5\' 2"  (1.575 m), SpO2 99 %.  PHYSICAL EXAMINATION:   Physical Exam  GENERAL:  77 y.o.-year-old patient lying in bed in no acute distress.  EYES: Pupils equal, round, reactive to light and accommodation. No scleral icterus. Extraocular muscles intact.   HEENT: Head atraumatic, normocephalic. Oropharynx and nasopharynx clear.  NECK:  Supple, no jugular venous distention. No thyroid enlargement, no tenderness.  LUNGS: Normal breath sounds bilaterally, no wheezing, rales, rhonchi. No use of accessory muscles of respiration.  CARDIOVASCULAR: S1, S2 normal. No murmurs, rubs, or gallops.  ABDOMEN: Soft, nontender, nondistended. Bowel sounds present. No organomegaly or mass.  EXTREMITIES: No cyanosis, clubbing or edema b/l.  Left BKA.     NEUROLOGIC: Cranial nerves II through XII are intact. No focal Motor or sensory deficits b/l.  Globally weak.  PSYCHIATRIC: The patient is alert and oriented x 3.  SKIN: No obvious rash, lesion, or ulcer.    LABORATORY PANEL:   CBC Recent Labs  Lab 01/10/18 2316  WBC 5.1  HGB 11.2*  HCT 34.2*  PLT 295   ------------------------------------------------------------------------------------------------------------------  Chemistries  Recent Labs  Lab 01/10/18 2316  NA 128*  K 4.0  CL 95*  CO2 27  GLUCOSE 101*  BUN 43*  CREATININE 2.72*  CALCIUM 8.0*   ------------------------------------------------------------------------------------------------------------------  Cardiac Enzymes Recent Labs  Lab 01/10/18 1327  TROPONINI <0.03   ------------------------------------------------------------------------------------------------------------------  RADIOLOGY:  Dg Chest 2 View  Result Date: 01/10/2018 CLINICAL DATA:  Dyspnea and hypotension. EXAM: CHEST - 2 VIEW COMPARISON:  08/20/2017. FINDINGS: Dialysis catheter from RIGHT IJ approach, has its tip at the cavoatrial junction. Cardiomegaly. BILATERAL pleural effusions, and pulmonary edema, with worsening aeration from priors. IMPRESSION: Cardiomegaly with congestive heart failure/fluid overload. Worsening aeration from priors. Electronically Signed   By: Elsie Stain M.D.   On: 01/10/2018 14:35     ASSESSMENT AND PLAN:   77 year old female  with past medical  history of end-stage renal disease on hemodialysis, chronic hypotension, hypothyroidism, CHF, who presents to the hospital after an episode of weakness/hypotension after dialysis yesterday.    1.  Hypotension-this is chronic for the patient as she usually is on Midodrine - Improved since yesterday with some IV fluids and also getting extra Midorine.   2.  Pleural effusion-patient noted to have fluid overload/pleural effusion on chest x-ray on admission yesterday.  Seen by nephrology and plan for extra dialysis session today.  She is not hypoxic or clinically symptomatic presently.  3.  End-stage renal disease on hemodialysis-patient normally gets dialysis on Tuesday Thursday Saturday.  Plan for extra dialysis session due to the volume overload and pleural effusion as stated above.  Appreciate nephrology input.  4.  Hypothyroidism-continue Synthroid.  Next  5.  History of glaucoma-continue Alphagan, Azopt eyedrops.    All the records are reviewed and case discussed with Care Management/Social Worker. Management plans discussed with the patient, family and they are in agreement.  CODE STATUS: Full code  DVT Prophylaxis: Hep. SQ  TOTAL TIME TAKING CARE OF THIS PATIENT: 30 minutes.   POSSIBLE D/C IN 1-2- DAYS, DEPENDING ON CLINICAL CONDITION.   Houston SirenSAINANI,VIVEK J M.D on 01/11/2018 at 2:24 PM  Between 7am to 6pm - Pager - [phone]  After 6pm go to [email or web address] - Therapist, nutritionalpassword EPAS [facility]  [facility]  Office  [phone]  CC: Primary care physician; [facility]

## 2018-01-11 NOTE — Care Management (Signed)
Dimas ChyleAmanda Morris dialysis liaison notified of anticipated discharge

## 2018-01-11 NOTE — [place] FL2 (Signed)
[place] MEDICAID FL2 LEVEL OF CARE SCREENING TOOL     IDENTIFICATION  Patient Name: Ann Cunningham Birthdate: Dec 18, 1940 Sex: female Admission Date (Current Location): 01/10/2018  [place]  [name] [id number] Q Facility and Address:  [facility], [address], [place], [place] [address]      Provider Number: [id number]  Attending Physician Name and Address:  Houston SirenSainani, Vivek J, MD  Relative Name and Phone Number:  Marzetta MerinoWatlington,Linda Niece   [phone] or Wade,Vicky Niece   [phone] or Ubaldo GlassingKing,Robert L Friend [phone]      Current Level of Care: Hospital Recommended Level of Care: Skilled Nursing Facility Prior Approval Number:    Date Approved/Denied:   PASRR Number: [id number]  Discharge Plan: SNF    Current Diagnoses: Patient Active Problem List   Diagnosis Date Noted  . Hypotension 01/10/2018  . Protein-calorie malnutrition, severe 10/20/2017  . Encephalopathy 10/19/2017  . ESRD on dialysis (HCC) 09/12/2017  . Essential hypertension 09/12/2017  . Failure to thrive in adult 08/17/2017  . Dyspnea 08/14/2017    Orientation RESPIRATION BLADDER Height & Weight     Self, Situation, Place  Normal Incontinent Weight:   Height:  5\' 2"  (157.5 cm)  BEHAVIORAL SYMPTOMS/MOOD NEUROLOGICAL BOWEL NUTRITION STATUS      Continent Diet(Renal diet)  AMBULATORY STATUS COMMUNICATION OF NEEDS Skin   Limited Assist Verbally Surgical wounds                       Personal Care Assistance Level of Assistance  Bathing, Feeding, Dressing Bathing Assistance: Limited assistance Feeding assistance: Limited assistance Dressing Assistance: Limited assistance     Functional Limitations Info  Sight, Hearing, Speech Sight Info: Adequate Hearing Info: Adequate Speech Info: Adequate    SPECIAL CARE FACTORS FREQUENCY                       Contractures Contractures Info: Not present    Additional Factors Info  Code  Status, Allergies Code Status Info: Full Code Allergies Info: Penicillins           Current Medications (01/11/2018):  This is the current hospital active medication list Current Facility-Administered Medications  Medication Dose Route Frequency Provider Last Rate Last Dose  . acetaminophen (TYLENOL) tablet 650 mg  650 mg Oral Q6H PRN Alford HighlandWieting, Richard, MD   650 mg at 01/11/18 0349   Or  . acetaminophen (TYLENOL) suppository 650 mg  650 mg Rectal Q6H PRN Alford HighlandWieting, Richard, MD      . aspirin EC tablet 81 mg  81 mg Oral Daily Wieting, Richard, MD      . brimonidine (ALPHAGAN) 0.2 % ophthalmic solution 1 drop  1 drop Both Eyes Daily PRN Wieting, Richard, MD      . brinzolamide (AZOPT) 1 % ophthalmic suspension 1 drop  1 drop Both Eyes QHS Wieting, Richard, MD      . diclofenac sodium (VOLTAREN) 1 % transdermal gel 2 g  2 g Topical QID Alford HighlandWieting, Richard, MD   2 g at 01/11/18 1304  . furosemide (LASIX) tablet 80 mg  80 mg Oral Daily Wieting, Richard, MD      . heparin injection 5,000 Units  5,000 Units Subcutaneous Q8H Alford HighlandWieting, Richard, MD   5,000 Units at 01/11/18 [phone]  . HYDROcodone-acetaminophen (NORCO/VICODIN) 5-325 MG per tablet 1-2 tablet  1-2 tablet Oral Q6H PRN Wieting, Richard, MD      . levothyroxine (SYNTHROID, LEVOTHROID) tablet 25 mcg  25 mcg  Oral QAC breakfast Alford Highland, MD   25 mcg at 01/11/18 [phone]  . [START ON 01/12/2018] midodrine (PROAMATINE) tablet 5 mg  5 mg Oral Q T,Th,Sa-HD Wieting, Richard, MD      . multivitamin (RENA-VIT) tablet 1 tablet  1 tablet Oral QHS Alford Highland, MD   1 tablet at 01/10/18 2106  . polyethylene glycol (MIRALAX / GLYCOLAX) packet 17 g  17 g Oral Daily Alford Highland, MD   17 g at 01/10/18 1834     Discharge Medications: Please see discharge summary for a list of discharge medications.  Relevant Imaging Results:  Relevant Lab Results:   Additional Information SS# [id number]  [place]

## 2018-01-11 NOTE — Progress Notes (Signed)
[facility]  ROUNDING NOTE   Subjective:  Patient well-known to us as we follow her for outpatient hemodialysis. Came in yesterday with hypotension and decreased responsiveness. Patient currently awake and alert and following commands. Patient also had shortness of breath and found to have bilateral pleural effusions.   Objective:  Vital signs in last 24 hours:  Temp:  [97.5 F (36.4 C)-98.5 F (36.9 C)] 98.3 F (36.8 C) (04/19 1013) Pulse Rate:  [91-100] 92 (04/19 1013) Resp:  [15-20] 17 (04/19 1013) BP: (108-141)/(59-87) 110/70 (04/19 1013) SpO2:  [98 %-100 %] 99 % (04/19 1013)  Weight change:  There were no vitals filed for this visit.  Intake/Output: I/O last 3 completed shifts: In: 60 [P.O.:60] Out: -    Intake/Output this shift:  No intake/output data recorded.  Physical Exam: General: No acute distress  Head: Normocephalic, atraumatic. Moist oral mucosal membranes  Eyes: Anicteric  Neck: Increased JVD  Lungs:  Diminished at bases, normal effort  Heart: S1S2 no rubs  Abdomen:  Soft, nontender, bowel sounds present  Extremities: traceperipheral edema.  Neurologic: Awake, alert, following commands  Skin: No lesions  Access: R IJ permcath    Basic Metabolic Panel: Recent Labs  Lab 01/10/18 1327 01/10/18 2316  NA 129* 128*  K 3.5 4.0  CL 94* 95*  CO2 25 27  GLUCOSE 85 101*  BUN 39* 43*  CREATININE 2.45* 2.72*  CALCIUM 8.2* 8.0*    Liver Function Tests: No results for input(s): AST, ALT, ALKPHOS, BILITOT, PROT, ALBUMIN in the last 168 hours. No results for input(s): LIPASE, AMYLASE in the last 168 hours. No results for input(s): AMMONIA in the last 168 hours.  CBC: Recent Labs  Lab 01/10/18 1327 01/10/18 2316  WBC 7.7 5.1  HGB 11.7* 11.2*  HCT 36.5 34.2*  MCV 86.0 85.4  PLT 305 295    Cardiac Enzymes: Recent Labs  Lab 01/10/18 1327  TROPONINI <0.03    BNP: Invalid input(s): POCBNP  CBG: No results for input(s):  GLUCAP in the last 168 hours.  Microbiology: Results for orders placed or performed during the hospital encounter of 01/10/18  CULTURE, BLOOD (ROUTINE X 2) w Reflex to ID Panel     Status: None (Preliminary result)   Collection Time: 01/10/18 11:10 PM  Result Value Ref Range Status   Specimen Description BLOOD BLOOD RIGHT HAND  Final   Special Requests   Final    BOTTLES DRAWN AEROBIC AND ANAEROBIC Blood Culture results may not be optimal due to an inadequate volume of blood received in culture bottles   Culture   Final    NO GROWTH < 12 HOURS Performed at [facility], [address] [place], [place] [address]    Report Status PENDING  Incomplete  CULTURE, BLOOD (ROUTINE X 2) w Reflex to ID Panel     Status: None (Preliminary result)   Collection Time: 01/10/18 11:10 PM  Result Value Ref Range Status   Specimen Description BLOOD BLOOD RIGHT WRIST  Final   Special Requests   Final    BOTTLES DRAWN AEROBIC AND ANAEROBIC Blood Culture results may not be optimal due to an inadequate volume of blood received in culture bottles   Culture   Final    NO GROWTH < 12 HOURS Performed at [facility], [address] [place], [place] [address]    Report Status PENDING  Incomplete    Coagulation Studies: No results for input(s): LABPROT, INR in the last 72 hours.  Urinalysis: No results for input(s): COLORURINE, LABSPEC, PHURINE, GLUCOSEU, HGBUR, BILIRUBINUR, KETONESUR, PROTEINUR, UROBILINOGEN, NITRITE, LEUKOCYTESUR in the last 72 hours.  Invalid input(s): APPERANCEUR    Imaging: Dg Chest 2 View  Result Date: 01/10/2018 CLINICAL DATA:  Dyspnea and hypotension. EXAM: CHEST - 2 VIEW COMPARISON:  08/20/2017. FINDINGS: Dialysis catheter from RIGHT IJ approach, has its tip at the cavoatrial junction. Cardiomegaly. BILATERAL pleural effusions, and pulmonary edema, with worsening aeration from priors. IMPRESSION: Cardiomegaly with congestive heart failure/fluid overload.  Worsening aeration from priors. Electronically Signed   By: Elsie Stain M.D.   On: 01/10/2018 14:35     Medications:    . aspirin EC  81 mg Oral Daily  . brinzolamide  1 drop Both Eyes QHS  . diclofenac sodium  2 g Topical QID  . furosemide  80 mg Oral Daily  . heparin  5,000 Units Subcutaneous Q8H  . levothyroxine  25 mcg Oral QAC breakfast  . [START ON 01/12/2018] midodrine  5 mg Oral Q T,Th,Sa-HD  . multivitamin  1 tablet Oral QHS  . polyethylene glycol  17 g Oral Daily   acetaminophen **OR** acetaminophen, brimonidine, HYDROcodone-acetaminophen  Assessment/ Plan:  77 y.o. female with end stage renal disease on HD TTHS, congestive heart failure, hypertension, anemia, secondary hyperparathyroidism  [facility]S/[address]   1. ESRD on HD TTHS:  Signs of congestive heart failure noted on chest x-ray as well as clinically.  We will plan for another dialysis treatment today with ultrafiltration target of 1.5 kg.  We will plan for dialysis tomorrow as well.  2.  Anemia of chronic kidney disease.    Hemoglobin 11.2.  Hold off on Epogen for now.  3.  Secondary hyperparathyroidism.  Check phosphorus with dialysis treatment today.  4.  Acute on chronic systolic heart failure exacerbation.  Findings and failure noted on chest x-ray as well as clinically.  Extra dialysis treatment today for additional ultrafiltration.  5.  Hypotension.  Maintain the patient on midodrine.     LOS: 1 Bellarae Lizer 4/19/201912:45 PM

## 2018-01-11 NOTE — Progress Notes (Signed)
HD STARTED  

## 2018-01-11 NOTE — Progress Notes (Signed)
This note also relates to the following rows which could not be included: Pulse Rate - Cannot attach notes to unvalidated device data Resp - Cannot attach notes to unvalidated device data BP - Cannot attach notes to unvalidated device data  Hd completed  
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
## 2018-01-11 NOTE — Clinical Social Work Note (Signed)
Patient is a long term care resident at [facility], CSW unable to complete assessment due to patient being in dialysis.  CSW will try to complete assessment at a later time.  Ervin KnackEric R. Coner Gibbard, MSW, Theresia MajorsLCSWA [phone]  01/11/2018 6:18 PM

## 2018-01-11 NOTE — Progress Notes (Signed)
Patient noted to have a left lower extremity DVT on ultrasound today.  Ultrasound is positive for peroneal DVT.  Will get vascular input and hold off on anticoagulation now.

## 2018-01-12 DIAGNOSIS — R531 Weakness: Secondary | ICD-10-CM | POA: Diagnosis not present

## 2018-01-12 DIAGNOSIS — I132 Hypertensive heart and chronic kidney disease with heart failure and with stage 5 chronic kidney disease, or end stage renal disease: Secondary | ICD-10-CM | POA: Diagnosis not present

## 2018-01-12 LAB — PHOSPHORUS: Phosphorus: 2.4 mg/dL — ABNORMAL LOW (ref 2.5–4.6)

## 2018-01-12 NOTE — Discharge Summary (Signed)
[facility] - [facility]   PATIENT NAME: Ann Cunningham    MR#:  [id number]  DATE OF BIRTH:  1941/04/22  DATE OF ADMISSION:  01/10/2018 ADMITTING PHYSICIAN: Alford Highland, MD  DATE OF DISCHARGE: 01/12/2018  PRIMARY CARE PHYSICIAN: Center, [facility]    ADMISSION DIAGNOSIS:  Weakness [R53.1] Pleural effusion [J90] End stage renal disease on dialysis (HCC) [N18.6, Z99.2]  DISCHARGE DIAGNOSIS:  Active Problems:   Hypotension   SECONDARY DIAGNOSIS:   Past Medical History:  Diagnosis Date  . Anemia   . Cardiomyopathy (HCC)   . CHF (congestive heart failure) (HCC)   . CKD (chronic kidney disease)   . Glaucoma   . Hypertension   . Hypothyroidism   . Pressure ulcer of sacrum   . Renal dialysis device, implant, or graft complication   . Renal disorder   . Uses walker     HOSPITAL COURSE:   77 year old female with past medical history of end-stage renal disease on hemodialysis, chronic hypotension, hypothyroidism, CHF, who presents to the hospital after an episode of weakness/hypotension after dialysis yesterday.    1.  Hypotension-this is chronic for the patient as she usually is on Midodrine -Patient was given some IV fluids and maintained on her Midodrine and pt's BP much improved now.   2.  Pleural effusion-patient noted to have fluid overload/pleural effusion on chest x-ray on admission. Patient was seen by nephrology and had extra dialysis session yesterday.  Denies any shortness of breath and she was never really hypoxic.  This can be further followed up as an outpatient with repeat chest x-ray in a few days.  3.  End-stage renal disease on hemodialysis-patient normally gets dialysis on Tuesday Thursday Saturday.   -Patient got an extra dialysis session due to the pleural effusion was noted on chest x-ray on admission.  She will not resume her scheduled dialysis as mentioned above.  4.  Hypothyroidism- will continue  Synthroid.    5.  History of glaucoma- she will continue Alphagan, Azopt eyedrops.  6.  Left lower extremity DVT-patient had some leg swelling on the left side and her legs looked unequal.  Patient underwent a Doppler of her lower extremity which showed a left-sided peroneal DVT.  I discussed the case with vascular surgery Dr. Gilda Crease who did not recommend anticoagulation at this time.  Patient should have a Doppler study repeated within a week to see if the DVT has propagated and if it has then anticoagulation will be recommended but not presently. Pt. Is clinically asymptomatic.   DISCHARGE CONDITIONS:   Stable.   CONSULTS OBTAINED:  Treatment Team:  Mady Haagensen, MD  DRUG ALLERGIES:   Allergies  Allergen Reactions  . Penicillins Other (See Comments)    Has patient had a PCN reaction causing immediate rash, facial/tongue/throat swelling, SOB or lightheadedness with hypotension: Unknown Has patient had a PCN reaction causing severe rash involving mucus membranes or skin necrosis: Unknown Has patient had a PCN reaction that required hospitalization: Unknown Has patient had a PCN reaction occurring within the last 10 years: Unknown If all of the above answers are "NO", then may proceed with Cephalosporin use.     DISCHARGE MEDICATIONS:   Allergies as of 01/12/2018      Reactions   Penicillins Other (See Comments)   Has patient had a PCN reaction causing immediate rash, facial/tongue/throat swelling, SOB or lightheadedness with hypotension: Unknown Has patient had a PCN reaction causing severe rash involving mucus membranes or skin  necrosis: Unknown Has patient had a PCN reaction that required hospitalization: Unknown Has patient had a PCN reaction occurring within the last 10 years: Unknown If all of the above answers are "NO", then may proceed with Cephalosporin use.      Medication List    STOP taking these medications   acetaminophen 325 MG tablet Commonly known as:   TYLENOL   diclofenac sodium 1 % Gel Commonly known as:  VOLTAREN   furosemide 80 MG tablet Commonly known as:  LASIX     TAKE these medications   aspirin 81 MG tablet Take 81 mg by mouth daily.   bisacodyl 5 MG EC tablet Generic drug:  bisacodyl Take 10 mg by mouth daily.   bisoprolol 5 MG tablet Commonly known as:  ZEBETA Take 1 tablet (5 mg total) by mouth at bedtime.   brimonidine 0.2 % ophthalmic solution Commonly known as:  ALPHAGAN Place 1 drop into both eyes daily as needed (glaucoma). What changed:  when to take this   brinzolamide 1 % ophthalmic suspension Commonly known as:  AZOPT Place 1 drop into both eyes at bedtime.   HYDROcodone-acetaminophen 5-325 MG tablet Commonly known as:  NORCO Take 1-2 tablets by mouth every 6 (six) hours as needed.   latanoprost 0.005 % ophthalmic solution Commonly known as:  XALATAN Place 1 drop into both eyes at bedtime.   levothyroxine 25 MCG tablet Commonly known as:  SYNTHROID, LEVOTHROID Take 25 mcg by mouth daily before breakfast.   lidocaine-prilocaine cream Commonly known as:  EMLA Apply 1 application topically Every Tuesday,Thursday,and Saturday with dialysis.   midodrine 5 MG tablet Commonly known as:  PROAMATINE Take 5 mg by mouth 3 (three) times a week. Give prior to dialysis on Tuesdays, Thursdays and Saturdays.   mirtazapine 7.5 MG tablet Commonly known as:  REMERON Take 1 tablet by mouth at bedtime.   multivitamin Tabs tablet Take 1 tablet by mouth at bedtime.   MURO 128 5 % ophthalmic ointment Generic drug:  sodium chloride Place 1 application into the left eye 3 (three) times daily.   sodium chloride 5 % ophthalmic solution Commonly known as:  MURO 128 Place 1 drop into the left eye at bedtime.   polyethylene glycol packet Commonly known as:  MIRALAX / GLYCOLAX Take 17 g by mouth daily.   spironolactone 25 MG tablet Commonly known as:  ALDACTONE Take 1 tablet by mouth every Monday,  Wednesday, and Friday.         DISCHARGE INSTRUCTIONS:   DIET:  Cardiac diet and Renal diet  DISCHARGE CONDITION:  Stable  ACTIVITY:  Activity as tolerated  OXYGEN:  Home Oxygen: No.   Oxygen Delivery: room air  DISCHARGE LOCATION:  nursing home   If you experience worsening of your admission symptoms, develop shortness of breath, life threatening emergency, suicidal or homicidal thoughts you must seek medical attention immediately by calling 911 or calling your MD immediately  if symptoms less severe.  You Must read complete instructions/literature along with all the possible adverse reactions/side effects for all the Medicines you take and that have been prescribed to you. Take any new Medicines after you have completely understood and accpet all the possible adverse reactions/side effects.   Please note  You were cared for by a hospitalist during your hospital stay. If you have any questions about your discharge medications or the care you received while you were in the hospital after you are discharged, you can call the unit and asked  to speak with the hospitalist on call if the hospitalist that took care of you is not available. Once you are discharged, your primary care physician will handle any further medical issues. Please note that NO REFILLS for any discharge medications will be authorized once you are discharged, as it is imperative that you return to your primary care physician (or establish a relationship with a primary care physician if you do not have one) for your aftercare needs so that they can reassess your need for medications and monitor your lab values.     Today   No acute events overnight. BP stable.  Will had scheduled dialysis today and discharge to SNF thereafter.   VITAL SIGNS:  Blood pressure 99/61, pulse 92, temperature 98.1 F (36.7 C), temperature source Oral, resp. rate 16, height 5\' 2"  (1.575 m), SpO2 100 %.  I/O:    Intake/Output  Summary (Last 24 hours) at 01/12/2018 1216 Last data filed at 01/12/2018 0218 Gross per 24 hour  Intake 0 ml  Output 500 ml  Net -500 ml    PHYSICAL EXAMINATION:    GENERAL:  77 y.o.-year-old patient lying in bed in no acute distress.  EYES: Pupils equal, round, reactive to light and accommodation. No scleral icterus. Extraocular muscles intact.  HEENT: Head atraumatic, normocephalic. Oropharynx and nasopharynx clear.  NECK:  Supple, no jugular venous distention. No thyroid enlargement, no tenderness.  LUNGS: Normal breath sounds bilaterally, no wheezing, rales, rhonchi. No use of accessory muscles of respiration.  CARDIOVASCULAR: S1, S2 normal. No murmurs, rubs, or gallops.  ABDOMEN: Soft, nontender, nondistended. Bowel sounds present. No organomegaly or mass.  EXTREMITIES: No cyanosis, clubbing or edema b/l.  Left BKA.     NEUROLOGIC: Cranial nerves II through XII are intact. No focal Motor or sensory deficits b/l.  Globally weak.  PSYCHIATRIC: The patient is alert and oriented x 3.  SKIN: No obvious rash, lesion, or ulcer.    DATA REVIEW:   CBC Recent Labs  Lab 01/10/18 2316  WBC 5.1  HGB 11.2*  HCT 34.2*  PLT 295    Chemistries  Recent Labs  Lab 01/10/18 2316  NA 128*  K 4.0  CL 95*  CO2 27  GLUCOSE 101*  BUN 43*  CREATININE 2.72*  CALCIUM 8.0*    Cardiac Enzymes Recent Labs  Lab 01/10/18 1327  TROPONINI <0.03    Microbiology Results  Results for orders placed or performed during the hospital encounter of 01/10/18  CULTURE, BLOOD (ROUTINE X 2) w Reflex to ID Panel     Status: None (Preliminary result)   Collection Time: 01/10/18 11:10 PM  Result Value Ref Range Status   Specimen Description BLOOD BLOOD RIGHT HAND  Final   Special Requests   Final    BOTTLES DRAWN AEROBIC AND ANAEROBIC Blood Culture results may not be optimal due to an inadequate volume of blood received in culture bottles   Culture   Final    NO GROWTH 2 DAYS Performed at [facility], [address] [place], [place] [address]    Report Status PENDING  Incomplete  CULTURE, BLOOD (ROUTINE X 2) w Reflex to ID Panel     Status: None (Preliminary result)   Collection Time: 01/10/18 11:10 PM  Result Value Ref Range Status   Specimen Description BLOOD BLOOD RIGHT WRIST  Final   Special Requests   Final    BOTTLES DRAWN AEROBIC AND ANAEROBIC Blood Culture results may not be optimal due to an inadequate  volume of blood received in culture bottles   Culture   Final    NO GROWTH 2 DAYS Performed at [facility], [address] [place], [place] [address]    Report Status PENDING  Incomplete  MRSA PCR Screening     Status: None   Collection Time: 01/11/18  8:57 PM  Result Value Ref Range Status   MRSA by PCR NEGATIVE NEGATIVE Final    Comment:        The GeneXpert MRSA Assay (FDA approved for NASAL specimens only), is one component of a comprehensive MRSA colonization surveillance program. It is not intended to diagnose MRSA infection nor to guide or monitor treatment for MRSA infections. Performed at [facility], [address] [place], [place] [address]     RADIOLOGY:  Dg Chest 2 View  Result Date: 01/10/2018 CLINICAL DATA:  Dyspnea and hypotension. EXAM: CHEST - 2 VIEW COMPARISON:  08/20/2017. FINDINGS: Dialysis catheter from RIGHT IJ approach, has its tip at the cavoatrial junction. Cardiomegaly. BILATERAL pleural effusions, and pulmonary edema, with worsening aeration from priors. IMPRESSION: Cardiomegaly with congestive heart failure/fluid overload. Worsening aeration from priors. Electronically Signed   By: Elsie StainJohn T Curnes M.D.   On: 01/10/2018 14:35   [place] Venous Img Lower Unilateral Left  Result Date: 01/11/2018 CLINICAL DATA:  77 year old female with left lower extremity edema EXAM: LEFT LOWER EXTREMITY VENOUS DOPPLER ULTRASOUND TECHNIQUE: Gray-scale sonography with graded compression, as well as color Doppler and duplex  ultrasound were performed to evaluate the lower extremity deep venous systems from the level of the common femoral vein and including the common femoral, femoral, profunda femoral, popliteal and calf veins including the posterior tibial, peroneal and gastrocnemius veins when visible. The superficial great saphenous vein was also interrogated. Spectral Doppler was utilized to evaluate flow at rest and with distal augmentation maneuvers in the common femoral, femoral and popliteal veins. COMPARISON:  None. FINDINGS: Contralateral Common Femoral Vein: Respiratory phasicity is normal and symmetric with the symptomatic side. No evidence of thrombus. Normal compressibility. Common Femoral Vein: No evidence of thrombus. Normal compressibility, respiratory phasicity and response to augmentation. Saphenofemoral Junction: No evidence of thrombus. Normal compressibility and flow on color Doppler imaging. Profunda Femoral Vein: No evidence of thrombus. Normal compressibility and flow on color Doppler imaging. Femoral Vein: No evidence of thrombus. Normal compressibility, respiratory phasicity and response to augmentation. Popliteal Vein: No evidence of thrombus. Normal compressibility, respiratory phasicity and response to augmentation. Calf Veins: Posterior tibial veins are patent and compressible. However, 1 of the paired peroneal veins is noncompressible and demonstrates no evidence of color flow on color Doppler imaging. Superficial Great Saphenous Vein: No evidence of thrombus. Normal compressibility. Venous Reflux:  None. Other Findings:  None. IMPRESSION: 1. Positive for isolated calf DVT involving one of the paired peroneal veins. Electronically Signed   By: Malachy MoanHeath  McCullough M.D.   On: 01/11/2018 14:36      Management plans discussed with the patient, family and they are in agreement.  CODE STATUS:     Code Status Orders  (From admission, onward)        Start     Ordered   01/10/18 1537  Full code   Continuous     01/10/18 1536    TOTAL TIME TAKING CARE OF THIS PATIENT: 40 minutes.    Houston SirenSAINANI,Austan Nicholl J M.D on 01/12/2018 at 12:16 PM  Between 7am to 6pm - Pager - [phone]  After 6pm go to [email or web address] - password [email or web address]  [facility] [place] [facility]  Office  [phone]  CC: Primary care physician; Center, [facility]

## 2018-01-12 NOTE — Progress Notes (Signed)
Hd started  

## 2018-01-12 NOTE — Progress Notes (Signed)
This note also relates to the following rows which could not be included: Pulse Rate - Cannot attach notes to unvalidated device data Resp - Cannot attach notes to unvalidated device data BP - Cannot attach notes to unvalidated device data  Hd completed  

## 2018-01-12 NOTE — Progress Notes (Signed)
Report called to Donella StadeShakira, RN, at [facility]; all questions were answered.  IV removed, no complications noted.  Awaiting EMS transport.

## 2018-01-12 NOTE — Clinical Social Work Note (Signed)
Clinical Social Work Assessment  Patient Details  Name: Ann Cunningham MRN: [id number] Date of Birth: November 25, 1940  Date of referral:  01/12/18               Reason for consult:  Facility Placement                Permission sought to share information with:  Facility Industrial/product designerContact Representative Permission granted to share information::  Yes, Verbal Permission Granted  Name::        Agency::     Relationship::     Contact Information:     Housing/Transportation Living arrangements for the past 2 months:  Skilled Nursing Facility Source of Information:  Patient, Medical Team, Facility Patient Interpreter Needed:  None Criminal Activity/Legal Involvement Pertinent to Current Situation/Hospitalization:  No - Comment as needed Significant Relationships:  Other Family Members, Friend, [employer] DodgeCommunity Support Lives with:  Facility Resident Do you feel safe going back to the place where you live?  Yes Need for family participation in patient care:  No (Coment)  Care giving concerns:  Patient is a L[facility]   Social Worker assessment / plan:  The CSW spoke with the patient about her imminent discharge today after dialysis. The patient reported that she is happy to return to her facility. Tresa EndoKelly with [facility] is in agreement with the patient returning today. The patient will discharge via non-emergent EMS. The CSW will sign off after delivering the discharge packet. Please consult should additional needs arise.   Employment status:  Retired Health and safety inspectornsurance information:  Medicare PT Recommendations:  Skilled Nursing Facility Information / Referral to community resources:     Patient/Family's Response to care:  The patient thanked the CSW.  Patient/Family's Understanding of and Emotional Response to Diagnosis, Current Treatment, and Prognosis:  The patient understands and agrees with the discharge plan.  Emotional Assessment Appearance:  Appears stated  age Attitude/Demeanor/Rapport:  Gracious Affect (typically observed):  Pleasant Orientation:  Oriented to Self, Oriented to Place, Oriented to  Time, Oriented to Situation Alcohol / Substance use:  Never Used Psych involvement (Current and /or in the community):  No (Comment)  Discharge Needs  Concerns to be addressed:  Care Coordination, Discharge Planning Concerns Readmission within the last 30 days:  Yes Current discharge risk:  None Barriers to Discharge:  No Barriers Identified   Judi CongKaren M Shikita Vaillancourt, LCSW 01/12/2018, 1:49 PM

## 2018-01-12 NOTE — Progress Notes (Signed)
Telemetry box intermittently displaying apnea with single digit respirations 1-7. Visibly assessed patient. Patient sleeping. No signs of respiratory distress. Easily aroused by voice. Counted respirations 22. ELQ

## 2018-01-12 NOTE — Progress Notes (Signed)
[facility]  ROUNDING NOTE   Subjective:  Patient did have hemodialysis yesterday. Due for dialysis again today. Patient switched to renal diet.   Objective:  Vital signs in last 24 hours:  Temp:  [97.4 F (36.3 C)-98.8 F (37.1 C)] 98.1 F (36.7 C) (04/20 0420) Pulse Rate:  [87-107] 92 (04/20 0420) Resp:  [14-26] 16 (04/20 0420) BP: (99-135)/(60-81) 99/61 (04/20 0420) SpO2:  [97 %-100 %] 100 % (04/20 0420)  Weight change:  There were no vitals filed for this visit.  Intake/Output: I/O last 3 completed shifts: In: 60 [P.O.:60] Out: 0    Intake/Output this shift:  Total I/O In: 0  Out: 500 [Other:500]  Physical Exam: General: No acute distress  Head: Normocephalic, atraumatic. Moist oral mucosal membranes  Eyes: Anicteric  Neck: supple  Lungs:  Diminished at bases, normal effort  Heart: S1S2 no rubs  Abdomen:  Soft, nontender, bowel sounds present  Extremities: Trace peripheral edema.  Neurologic: Awake, alert, following commands  Skin: No lesions  Access: R IJ permcath    Basic Metabolic Panel: Recent Labs  Lab 01/10/18 1327 01/10/18 2316 01/11/18 1724  NA 129* 128*  --   K 3.5 4.0  --   CL 94* 95*  --   CO2 25 27  --   GLUCOSE 85 101*  --   BUN 39* 43*  --   CREATININE 2.45* 2.72*  --   CALCIUM 8.2* 8.0*  --   PHOS  --   --  4.2    Liver Function Tests: No results for input(s): AST, ALT, ALKPHOS, BILITOT, PROT, ALBUMIN in the last 168 hours. No results for input(s): LIPASE, AMYLASE in the last 168 hours. No results for input(s): AMMONIA in the last 168 hours.  CBC: Recent Labs  Lab 01/10/18 1327 01/10/18 2316  WBC 7.7 5.1  HGB 11.7* 11.2*  HCT 36.5 34.2*  MCV 86.0 85.4  PLT 305 295    Cardiac Enzymes: Recent Labs  Lab 01/10/18 1327  TROPONINI <0.03    BNP: Invalid input(s): POCBNP  CBG: No results for input(s): GLUCAP in the last 168 hours.  Microbiology: Results for orders placed or performed during the  hospital encounter of 01/10/18  CULTURE, BLOOD (ROUTINE X 2) w Reflex to ID Panel     Status: None (Preliminary result)   Collection Time: 01/10/18 11:10 PM  Result Value Ref Range Status   Specimen Description BLOOD BLOOD RIGHT HAND  Final   Special Requests   Final    BOTTLES DRAWN AEROBIC AND ANAEROBIC Blood Culture results may not be optimal due to an inadequate volume of blood received in culture bottles   Culture   Final    NO GROWTH < 12 HOURS Performed at [facility], [address], [place], [place] [address]    Report Status PENDING  Incomplete  CULTURE, BLOOD (ROUTINE X 2) w Reflex to ID Panel     Status: None (Preliminary result)   Collection Time: 01/10/18 11:10 PM  Result Value Ref Range Status   Specimen Description BLOOD BLOOD RIGHT WRIST  Final   Special Requests   Final    BOTTLES DRAWN AEROBIC AND ANAEROBIC Blood Culture results may not be optimal due to an inadequate volume of blood received in culture bottles   Culture   Final    NO GROWTH < 12 HOURS Performed at [facility], [address], [place], [place] [address]    Report Status PENDING  Incomplete  MRSA  PCR Screening     Status: None   Collection Time: 01/11/18  8:57 PM  Result Value Ref Range Status   MRSA by PCR NEGATIVE NEGATIVE Final    Comment:        The GeneXpert MRSA Assay (FDA approved for NASAL specimens only), is one component of a comprehensive MRSA colonization surveillance program. It is not intended to diagnose MRSA infection nor to guide or monitor treatment for MRSA infections. Performed at [facility], [address] [place], [place] [address]     Coagulation Studies: No results for input(s): LABPROT, INR in the last 72 hours.  Urinalysis: No results for input(s): COLORURINE, LABSPEC, PHURINE, GLUCOSEU, HGBUR, BILIRUBINUR, KETONESUR, PROTEINUR, UROBILINOGEN, NITRITE, LEUKOCYTESUR in the last 72 hours.  Invalid input(s): APPERANCEUR     Imaging: Dg Chest 2 View  Result Date: 01/10/2018 CLINICAL DATA:  Dyspnea and hypotension. EXAM: CHEST - 2 VIEW COMPARISON:  08/20/2017. FINDINGS: Dialysis catheter from RIGHT IJ approach, has its tip at the cavoatrial junction. Cardiomegaly. BILATERAL pleural effusions, and pulmonary edema, with worsening aeration from priors. IMPRESSION: Cardiomegaly with congestive heart failure/fluid overload. Worsening aeration from priors. Electronically Signed   By: Elsie StainJohn T Curnes M.D.   On: 01/10/2018 14:35   [place] Venous Img Lower Unilateral Left  Result Date: 01/11/2018 CLINICAL DATA:  77 year old female with left lower extremity edema EXAM: LEFT LOWER EXTREMITY VENOUS DOPPLER ULTRASOUND TECHNIQUE: Gray-scale sonography with graded compression, as well as color Doppler and duplex ultrasound were performed to evaluate the lower extremity deep venous systems from the level of the common femoral vein and including the common femoral, femoral, profunda femoral, popliteal and calf veins including the posterior tibial, peroneal and gastrocnemius veins when visible. The superficial great saphenous vein was also interrogated. Spectral Doppler was utilized to evaluate flow at rest and with distal augmentation maneuvers in the common femoral, femoral and popliteal veins. COMPARISON:  None. FINDINGS: Contralateral Common Femoral Vein: Respiratory phasicity is normal and symmetric with the symptomatic side. No evidence of thrombus. Normal compressibility. Common Femoral Vein: No evidence of thrombus. Normal compressibility, respiratory phasicity and response to augmentation. Saphenofemoral Junction: No evidence of thrombus. Normal compressibility and flow on color Doppler imaging. Profunda Femoral Vein: No evidence of thrombus. Normal compressibility and flow on color Doppler imaging. Femoral Vein: No evidence of thrombus. Normal compressibility, respiratory phasicity and response to augmentation. Popliteal Vein: No evidence  of thrombus. Normal compressibility, respiratory phasicity and response to augmentation. Calf Veins: Posterior tibial veins are patent and compressible. However, 1 of the paired peroneal veins is noncompressible and demonstrates no evidence of color flow on color Doppler imaging. Superficial Great Saphenous Vein: No evidence of thrombus. Normal compressibility. Venous Reflux:  None. Other Findings:  None. IMPRESSION: 1. Positive for isolated calf DVT involving one of the paired peroneal veins. Electronically Signed   By: Malachy MoanHeath  McCullough M.D.   On: 01/11/2018 14:36     Medications:    . aspirin EC  81 mg Oral Daily  . brinzolamide  1 drop Both Eyes QHS  . diclofenac sodium  2 g Topical QID  . furosemide  80 mg Oral Daily  . heparin  5,000 Units Subcutaneous Q8H  . levothyroxine  25 mcg Oral QAC breakfast  . midodrine  5 mg Oral Q T,Th,Sa-HD  . multivitamin  1 tablet Oral QHS  . polyethylene glycol  17 g Oral Daily   acetaminophen **OR** acetaminophen, brimonidine, HYDROcodone-acetaminophen  Assessment/ Plan:  77 y.o. 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   1. ESRD on HD TTHS:  Signs of congestive heart failure noted on chest x-ray as well as clinically at admission.   -Patient due for her normal dialysis treatment today.  Orders have been prepared.  2.  Anemia of chronic kidney disease.    Hold Epogen at this time as most recent hemoglobin was 11.2.  3.  Secondary hyperparathyroidism.  Phosphorus acceptable at 4.2.  Continue to monitor.  4.  Acute on chronic systolic heart failure exacerbation.  Findings and failure noted on chest x-ray as well as clinically.  Continue ultrafiltration with dialysis.  5.  Hypotension.  Maintain the patient on midodrine.     LOS: 2 Dorrance Sellick 4/20/20196:48 AM

## 2018-01-14 ENCOUNTER — Ambulatory Visit: Admission: RE | Admit: 2018-01-14 | Payer: Medicare Other | Source: Ambulatory Visit | Admitting: Vascular Surgery

## 2018-01-14 SURGERY — DIALYSIS/PERMA CATHETER REMOVAL
Anesthesia: LOCAL

## 2018-01-15 LAB — CULTURE, BLOOD (ROUTINE X 2)
Culture: NO GROWTH
Culture: NO GROWTH

## 2018-01-16 ENCOUNTER — Encounter (INDEPENDENT_AMBULATORY_CARE_PROVIDER_SITE_OTHER): Payer: Self-pay

## 2018-01-16 ENCOUNTER — Other Ambulatory Visit (INDEPENDENT_AMBULATORY_CARE_PROVIDER_SITE_OTHER): Payer: Self-pay | Admitting: Vascular Surgery

## 2018-01-21 ENCOUNTER — Ambulatory Visit: Admission: RE | Admit: 2018-01-21 | Payer: Medicare Other | Source: Ambulatory Visit | Admitting: Vascular Surgery

## 2018-01-21 SURGERY — DIALYSIS/PERMA CATHETER REMOVAL
Anesthesia: LOCAL

## 2018-01-24 ENCOUNTER — Encounter (INDEPENDENT_AMBULATORY_CARE_PROVIDER_SITE_OTHER): Payer: Self-pay

## 2018-01-28 ENCOUNTER — Encounter (INDEPENDENT_AMBULATORY_CARE_PROVIDER_SITE_OTHER): Payer: Self-pay

## 2018-02-04 ENCOUNTER — Encounter
Admission: RE | Disposition: A | Discharge status: Skilled Nursing Facility | Payer: Self-pay | Source: Ambulatory Visit | Attending: Vascular Surgery

## 2018-02-04 ENCOUNTER — Encounter: Payer: Self-pay | Admitting: *Deleted

## 2018-02-04 ENCOUNTER — Ambulatory Visit
Admission: RE | Admit: 2018-02-04 | Discharge: 2018-02-04 | Disposition: A | Discharge status: Skilled Nursing Facility | Payer: Medicare Other | Source: Ambulatory Visit | Attending: Vascular Surgery | Admitting: Vascular Surgery

## 2018-02-04 DIAGNOSIS — Z880 Allergy status to penicillin: Secondary | ICD-10-CM | POA: Diagnosis not present

## 2018-02-04 DIAGNOSIS — Z9889 Other specified postprocedural states: Secondary | ICD-10-CM | POA: Insufficient documentation

## 2018-02-04 DIAGNOSIS — Z823 Family history of stroke: Secondary | ICD-10-CM | POA: Insufficient documentation

## 2018-02-04 DIAGNOSIS — I429 Cardiomyopathy, unspecified: Secondary | ICD-10-CM | POA: Diagnosis not present

## 2018-02-04 DIAGNOSIS — I10 Essential (primary) hypertension: Secondary | ICD-10-CM | POA: Diagnosis not present

## 2018-02-04 DIAGNOSIS — Z8249 Family history of ischemic heart disease and other diseases of the circulatory system: Secondary | ICD-10-CM | POA: Diagnosis not present

## 2018-02-04 DIAGNOSIS — Z992 Dependence on renal dialysis: Secondary | ICD-10-CM | POA: Insufficient documentation

## 2018-02-04 DIAGNOSIS — Z452 Encounter for adjustment and management of vascular access device: Secondary | ICD-10-CM | POA: Insufficient documentation

## 2018-02-04 DIAGNOSIS — I132 Hypertensive heart and chronic kidney disease with heart failure and with stage 5 chronic kidney disease, or end stage renal disease: Secondary | ICD-10-CM | POA: Diagnosis not present

## 2018-02-04 DIAGNOSIS — I509 Heart failure, unspecified: Secondary | ICD-10-CM | POA: Insufficient documentation

## 2018-02-04 DIAGNOSIS — Z9841 Cataract extraction status, right eye: Secondary | ICD-10-CM | POA: Diagnosis not present

## 2018-02-04 DIAGNOSIS — Z87891 Personal history of nicotine dependence: Secondary | ICD-10-CM | POA: Insufficient documentation

## 2018-02-04 DIAGNOSIS — H409 Unspecified glaucoma: Secondary | ICD-10-CM | POA: Insufficient documentation

## 2018-02-04 DIAGNOSIS — I2510 Atherosclerotic heart disease of native coronary artery without angina pectoris: Secondary | ICD-10-CM | POA: Diagnosis not present

## 2018-02-04 DIAGNOSIS — E039 Hypothyroidism, unspecified: Secondary | ICD-10-CM | POA: Insufficient documentation

## 2018-02-04 DIAGNOSIS — N186 End stage renal disease: Secondary | ICD-10-CM | POA: Diagnosis not present

## 2018-02-04 DIAGNOSIS — E119 Type 2 diabetes mellitus without complications: Secondary | ICD-10-CM | POA: Diagnosis not present

## 2018-02-04 DIAGNOSIS — E1122 Type 2 diabetes mellitus with diabetic chronic kidney disease: Secondary | ICD-10-CM | POA: Insufficient documentation

## 2018-02-04 HISTORY — PX: DIALYSIS/PERMA CATHETER REMOVAL: CATH118289

## 2018-02-04 LAB — GLUCOSE, CAPILLARY: GLUCOSE-CAPILLARY: 94 mg/dL (ref 65–99)

## 2018-02-04 SURGERY — DIALYSIS/PERMA CATHETER REMOVAL
Anesthesia: LOCAL

## 2018-02-04 MED ORDER — LIDOCAINE-EPINEPHRINE (PF) 1 %-1:200000 IJ SOLN
INTRAMUSCULAR | Status: DC | PRN
  Administered 2018-02-04: 10 mL

## 2018-02-04 SURGICAL SUPPLY — 2 items
FORCEPS HALSTEAD CVD 5IN STRL (INSTRUMENTS) ×2 IMPLANT
TRAY LACERAT/PLASTIC (MISCELLANEOUS) ×2 IMPLANT

## 2018-02-04 NOTE — H&P (Signed)
[facility] Admission History & Physical  MRN : [id number]  Ann Cunningham is a 77 y.o. (1941-07-22) female who presents with chief complaint of No chief complaint on file. [name]  History of Present Illness: I am asked to evaluate the patient by the dialysis center. The patient was sent here because they have a nonfunctioning tunneled catheter and a functioning right arm AVF.  The patient reports they're not been any problems with any of their dialysis runs. They are reporting good flows with good parameters at dialysis.  Patient denies pain or tenderness overlying the access.  There is no pain with dialysis.  The patient denies hand pain or finger pain consistent with steal syndrome.  No fevers or chills while on dialysis.   No current facility-administered medications for this encounter.     Past Medical History:  Diagnosis Date  . Anemia   . Cardiomyopathy (HCC)   . CHF (congestive heart failure) (HCC)   . CKD (chronic kidney disease)   . Glaucoma   . Hypertension   . Hypothyroidism   . Pressure ulcer of sacrum   . Renal dialysis device, implant, or graft complication   . Renal disorder   . Uses walker     Past Surgical History:  Procedure Laterality Date  . BASCILIC VEIN TRANSPOSITION Right 10/19/2017   Procedure: Radiocephalic fistula creation;  Surgeon: Renford Dills, MD;  Location: [facility] ORS;  Service: Vascular;  Laterality: Right;  . CAPD INSERTION N/A 08/15/2017   Procedure: LAPAROSCOPIC INSERTION CONTINUOUS AMBULATORY PERITONEAL DIALYSIS  (CAPD) CATHETER;  Surgeon: Renford Dills, MD;  Location: [facility] OR[facility];  Service: Vascular;  Laterality: N/A;  . CATARACT EXTRACTION W/ INTRAOCULAR LENS IMPLANT    . CATARACT EXTRACTION W/PHACO Right 12/25/2016   Procedure: CATARACT EXTRACTION PHACO AND INTRAOCULAR LENS PLACEMENT (IOC)  Right complicated;  Surgeon: Sherald Hess, MD;  Location: [facility];  Service: Ophthalmology;  Laterality:  Right;  Vision Blue  . DIALYSIS/PERMA CATHETER INSERTION N/A 08/20/2017   Procedure: DIALYSIS/PERMA CATHETER INSERTION;  Surgeon: Annice Needy, MD;  Location: [facility] INVASIVE [facility];  Service: Cardiovascular;  Laterality: N/A;  . REMOVAL OF A DIALYSIS CATHETER N/A 10/19/2017   Procedure: REMOVAL OF A DIALYSIS CATHETER;  Surgeon: Renford Dills, MD;  Location: [facility];  Service: Vascular;  Laterality: N/A;    Social History Social History   Tobacco Use  . Smoking status: Former Smoker    Packs/day: 0.50    Types: Cigarettes    Last attempt to quit: 06/14/2017    Years since quitting: 0.6  . Smokeless tobacco: Former Neurosurgeon    Types: Snuff  Substance Use Topics  . Alcohol use: No  . Drug use: No    Family History Family History  Problem Relation Age of Onset  . Hypertension Mother   . CVA Mother   . CAD Father     No family history of bleeding or clotting disorders, autoimmune disease or porphyria  Allergies  Allergen Reactions  . Penicillins Other (See Comments)    Has patient had a PCN reaction causing immediate rash, facial/tongue/throat swelling, SOB or lightheadedness with hypotension: Unknown Has patient had a PCN reaction causing severe rash involving mucus membranes or skin necrosis: Unknown Has patient had a PCN reaction that required hospitalization: Unknown Has patient had a PCN reaction occurring within the last 10 years: Unknown If all of the above answers are "NO", then may proceed with Cephalosporin use.  REVIEW OF SYSTEMS (Negative unless checked)  Constitutional: Weight loss  Fever  Chills Cardiac: Chest pain   Chest pressure   Palpitations   Shortness of breath when laying flat   Shortness of breath at rest   Shortness of breath with exertion. Vascular:  Pain in legs with walking   Pain in legs at rest   Pain in legs when laying flat   Claudication   Pain in feet when walking  Pain in feet at rest  Pain in feet when  laying flat   History of DVT   Phlebitis   Swelling in legs   Varicose veins   Non-healing ulcers Pulmonary:   Uses home oxygen   Productive cough   Hemoptysis   Wheeze  COPD   Asthma Neurologic:  Dizziness  Blackouts   Seizures   History of stroke   History of TIA  Aphasia   Temporary blindness   Dysphagia   Weakness or numbness in arms   Weakness or numbness in legs Musculoskeletal:  Arthritis   Joint swelling   Joint pain   Low back pain Hematologic:  Easy bruising  Easy bleeding   Hypercoagulable state   Anemic  Hepatitis Gastrointestinal:  Blood in stool   Vomiting blood  Gastroesophageal reflux/heartburn   Difficulty swallowing. Genitourinary:  Chronic kidney disease   Difficult urination  Frequent urination  Burning with urination   Blood in urine Skin:  Rashes   Ulcers   Wounds Psychological:  History of anxiety    History of major depression.  Physical Examination  There were no vitals filed for this visit. There is no height or weight on file to calculate BMI. Gen: WD/WN, NAD Head: Harold/AT, No temporalis wasting. Ear/Nose/Throat: Hearing grossly intact, nares w/o erythema or drainage, oropharynx w/o Erythema/Exudate,  Eyes: Conjunctiva clear, sclera non-icteric Neck: Trachea midline.  No JVD.  Pulmonary:  Good air movement, respirations not labored, no use of accessory muscles.  Cardiac: Irregular Vascular: good thrill right arm AVF Vessel Right Left  Radial Palpable Palpable                Musculoskeletal: M/S 5/5 throughout.  Extremities without ischemic changes.  No deformity or atrophy.  Neurologic: Sensation grossly intact in extremities.  Symmetrical.  Speech is fluent. Motor exam as listed above. Psychiatric: Judgment intact, Mood & affect appropriate for pt's clinical situation. Dermatologic: No rashes or ulcers noted.  No cellulitis or open wounds.    CBC Lab Results   Component Value Date   WBC 5.1 01/10/2018   HGB 11.2 (L) 01/10/2018   HCT 34.2 (L) 01/10/2018   MCV 85.4 01/10/2018   PLT 295 01/10/2018    BMET    Component Value Date/Time   NA 128 (L) 01/10/2018 2316   K 4.0 01/10/2018 2316   CL 95 (L) 01/10/2018 2316   CO2 27 01/10/2018 2316   GLUCOSE 101 (H) 01/10/2018 2316   BUN 43 (H) 01/10/2018 2316   CREATININE 2.72 (H) 01/10/2018 2316   CALCIUM 8.0 (L) 01/10/2018 2316   GFRNONAA 16 (L) 01/10/2018 2316   GFRAA 18 (L) 01/10/2018 2316   CrCl cannot be calculated (Patient's most recent lab result is older than the maximum 21 days allowed.).  COAG Lab Results  Component Value Date   INR 1.02 10/15/2017   INR 1.21 08/14/2017    Radiology Dg Chest 2 View  Result Date: 01/10/2018 CLINICAL DATA:  Dyspnea and hypotension. EXAM: CHEST - 2 VIEW COMPARISON:  08/20/2017. FINDINGS:  Dialysis catheter from RIGHT IJ approach, has its tip at the cavoatrial junction. Cardiomegaly. BILATERAL pleural effusions, and pulmonary edema, with worsening aeration from priors. IMPRESSION: Cardiomegaly with congestive heart failure/fluid overload. Worsening aeration from priors. Electronically Signed   By: Elsie Stain M.D.   On: 01/10/2018 14:35   US Venous Img Lower Unilateral Left  Result Date: 01/11/2018 CLINICAL DATA:  77 year old female with left lower extremity edema EXAM: LEFT LOWER EXTREMITY VENOUS DOPPLER ULTRASOUND TECHNIQUE: Gray-scale sonography with graded compression, as well as color Doppler and duplex ultrasound were performed to evaluate the lower extremity deep venous systems from the level of the common femoral vein and including the common femoral, femoral, profunda femoral, popliteal and calf veins including the posterior tibial, peroneal and gastrocnemius veins when visible. The superficial great saphenous vein was also interrogated. Spectral Doppler was utilized to evaluate flow at rest and with distal augmentation maneuvers in the common  femoral, femoral and popliteal veins. COMPARISON:  None. FINDINGS: Contralateral Common Femoral Vein: Respiratory phasicity is normal and symmetric with the symptomatic side. No evidence of thrombus. Normal compressibility. Common Femoral Vein: No evidence of thrombus. Normal compressibility, respiratory phasicity and response to augmentation. Saphenofemoral Junction: No evidence of thrombus. Normal compressibility and flow on color Doppler imaging. Profunda Femoral Vein: No evidence of thrombus. Normal compressibility and flow on color Doppler imaging. Femoral Vein: No evidence of thrombus. Normal compressibility, respiratory phasicity and response to augmentation. Popliteal Vein: No evidence of thrombus. Normal compressibility, respiratory phasicity and response to augmentation. Calf Veins: Posterior tibial veins are patent and compressible. However, 1 of the paired peroneal veins is noncompressible and demonstrates no evidence of color flow on color Doppler imaging. Superficial Great Saphenous Vein: No evidence of thrombus. Normal compressibility. Venous Reflux:  None. Other Findings:  None. IMPRESSION: 1. Positive for isolated calf DVT involving one of the paired peroneal veins. Electronically Signed   By: Malachy Moan M.D.   On: 01/11/2018 14:36    Assessment/Plan 1.  Complication dialysis device with nonfunctional catehter:  Patient's Tunneled catheter is not being used. The patient has an extremity access that is functioning well. Therefore, the patient will undergo removal of the tunneled catheter under local anesthesia.  The risks and benefits were described to the patient.  All questions were answered.  The patient agrees to proceed with angiography and intervention. Potassium will be drawn to ensure that it is an appropriate level prior to performing intervention. 2.  End-stage renal disease requiring hemodialysis:  Patient will continue dialysis therapy without further interruption. Dialysis  has already been arranged. 3.  Hypertension:  Patient will continue medical management; nephrology is following no changes in oral medications. 4.  Diabetes mellitus:  Glucose will be monitored and oral medications been held this morning once the patient has undergone the patient's procedure po intake will be reinitiated and again Accu-Cheks will be used to assess the blood glucose level and treat as needed. The patient will be restarted on the patient's usual hypoglycemic regime 5.  Coronary artery disease:  EKG will be monitored. Nitrates will be used if needed. The patient's oral cardiac medications will be continued.    Festus Barren, MD  02/04/2018 12:24 PM

## 2018-02-04 NOTE — Op Note (Signed)
Operative Note     Preoperative diagnosis:   1. ESRD with functional permanent access  Postoperative diagnosis:  1. ESRD with functional permanent access  Procedure:  Removal of right jugular Permcath  Surgeon:  Festus Barren, MD  Anesthesia:  Local  EBL:  Minimal  Indication for the Procedure:  The patient has a functional permanent dialysis access and no longer needs their permcath.  This can be removed.  Risks and benefits are discussed and informed consent is obtained.  Description of the Procedure:  The patient's right neck, chest and existing catheter were sterilely prepped and draped. The area around the catheter was anesthetized copiously with 1% lidocaine. The catheter was dissected out with curved hemostats until the cuff was freed from the surrounding fibrous sheath. The fiber sheath was transected, and the catheter was then removed in its entirety using gentle traction. Pressure was held and sterile dressings were placed. The patient tolerated the procedure well and was taken to the recovery room in stable condition.     Festus Barren  02/04/2018, 1:04 PM This note was created with Dragon Medical transcription system. Any errors in dictation are purely unintentional.

## 2018-02-04 NOTE — Discharge Instructions (Signed)
Tunneled Catheter Removal, Care After °Refer to this sheet in the next few weeks. These instructions provide you with information about caring for yourself after your procedure. Your health care provider may also give you more specific instructions. Your treatment has been planned according to current medical practices, but problems sometimes occur. Call your health care provider if you have any problems or questions after your procedure. °What can I expect after the procedure? °After the procedure, it is common to have: °· Some mild redness, swelling, and pain around your catheter site. ° ° °Follow these instructions at home: °Incision care  °· Check your removal site  every day for signs of infection. Check for: °¨ More redness, swelling, or pain. °¨ More fluid or blood. °¨ Warmth. °¨ Pus or a bad smell. °· Follow instructions from your health care provider about how to take care of your removal site. Make sure you: °¨ Wash your hands with soap and water before you change your bandages (dressings). If soap and water are not available, use hand sanitizer. °Activity  °· Return to your normal activities as told by your health care provider. Ask your health care provider what activities are safe for you. °· Do not lift anything that is heavier than 10 lb (4.5 kg) for 3 weeks or as long as told by your health care provider. ° °Contact a health care provider if: °· You have more fluid or blood coming from your removal site °· You have more redness, swelling, or pain at your incisions or around the area where your catheter was removed °· Your removal site feel warm to the touch. °· You feel unusually weak. °· You feel nauseous.. °· Get help right away if °· You have swelling in your arm, shoulder, neck, or face. °· You develop chest pain. °· You have difficulty breathing. °· You feel dizzy or light-headed. °· You have pus or a bad smell coming from your removal site °· You have a fever. °· You develop bleeding from your  removal site, and your bleeding does not stop. °This information is not intended to replace advice given to you by your health care provider. Make sure you discuss any questions you have with your health care provider. °Document Released: 08/28/2012 Document Revised: 05/14/2016 Document Reviewed: 06/07/2015 °Elsevier Interactive Patient Education © 2017 [employer] ° °

## 2018-02-12 ENCOUNTER — Other Ambulatory Visit: Payer: Self-pay

## 2018-02-12 ENCOUNTER — Inpatient Hospital Stay
Admission: EM | Admit: 2018-02-12 | Discharge: 2018-02-13 | DRG: 640 | Disposition: A | Discharge status: Skilled Nursing Facility | Payer: Medicare Other | Attending: Internal Medicine | Admitting: Internal Medicine

## 2018-02-12 ENCOUNTER — Emergency Department: Payer: Medicare Other

## 2018-02-12 DIAGNOSIS — Z8249 Family history of ischemic heart disease and other diseases of the circulatory system: Secondary | ICD-10-CM

## 2018-02-12 DIAGNOSIS — I953 Hypotension of hemodialysis: Secondary | ICD-10-CM | POA: Diagnosis present

## 2018-02-12 DIAGNOSIS — Z87891 Personal history of nicotine dependence: Secondary | ICD-10-CM | POA: Diagnosis not present

## 2018-02-12 DIAGNOSIS — I429 Cardiomyopathy, unspecified: Secondary | ICD-10-CM | POA: Diagnosis present

## 2018-02-12 DIAGNOSIS — D631 Anemia in chronic kidney disease: Secondary | ICD-10-CM | POA: Diagnosis not present

## 2018-02-12 DIAGNOSIS — Z992 Dependence on renal dialysis: Secondary | ICD-10-CM | POA: Diagnosis not present

## 2018-02-12 DIAGNOSIS — Z9841 Cataract extraction status, right eye: Secondary | ICD-10-CM | POA: Diagnosis not present

## 2018-02-12 DIAGNOSIS — I132 Hypertensive heart and chronic kidney disease with heart failure and with stage 5 chronic kidney disease, or end stage renal disease: Secondary | ICD-10-CM | POA: Diagnosis not present

## 2018-02-12 DIAGNOSIS — Z7982 Long term (current) use of aspirin: Secondary | ICD-10-CM

## 2018-02-12 DIAGNOSIS — N186 End stage renal disease: Secondary | ICD-10-CM | POA: Diagnosis not present

## 2018-02-12 DIAGNOSIS — I509 Heart failure, unspecified: Secondary | ICD-10-CM | POA: Diagnosis not present

## 2018-02-12 DIAGNOSIS — Z823 Family history of stroke: Secondary | ICD-10-CM | POA: Diagnosis not present

## 2018-02-12 DIAGNOSIS — R64 Cachexia: Secondary | ICD-10-CM | POA: Diagnosis present

## 2018-02-12 DIAGNOSIS — H409 Unspecified glaucoma: Secondary | ICD-10-CM | POA: Diagnosis not present

## 2018-02-12 DIAGNOSIS — Z6824 Body mass index (BMI) 24.0-24.9, adult: Secondary | ICD-10-CM | POA: Diagnosis not present

## 2018-02-12 DIAGNOSIS — R197 Diarrhea, unspecified: Secondary | ICD-10-CM | POA: Diagnosis present

## 2018-02-12 DIAGNOSIS — Z79890 Hormone replacement therapy (postmenopausal): Secondary | ICD-10-CM | POA: Diagnosis not present

## 2018-02-12 DIAGNOSIS — J90 Pleural effusion, not elsewhere classified: Secondary | ICD-10-CM

## 2018-02-12 DIAGNOSIS — Z961 Presence of intraocular lens: Secondary | ICD-10-CM | POA: Diagnosis present

## 2018-02-12 DIAGNOSIS — E875 Hyperkalemia: Secondary | ICD-10-CM | POA: Diagnosis present

## 2018-02-12 DIAGNOSIS — Z880 Allergy status to penicillin: Secondary | ICD-10-CM

## 2018-02-12 DIAGNOSIS — N2581 Secondary hyperparathyroidism of renal origin: Secondary | ICD-10-CM | POA: Diagnosis present

## 2018-02-12 DIAGNOSIS — Z9115 Patient's noncompliance with renal dialysis: Secondary | ICD-10-CM

## 2018-02-12 DIAGNOSIS — E039 Hypothyroidism, unspecified: Secondary | ICD-10-CM | POA: Diagnosis not present

## 2018-02-12 LAB — CBC WITH DIFFERENTIAL/PLATELET
Basophils Absolute: 0 10*3/uL (ref 0–0.1)
Basophils Relative: 0 %
Eosinophils Absolute: 0.2 10*3/uL (ref 0–0.7)
Eosinophils Relative: 4 %
HEMATOCRIT: 31.2 % — AB (ref 35.0–47.0)
HEMOGLOBIN: 10.1 g/dL — AB (ref 12.0–16.0)
LYMPHS PCT: 31 %
Lymphs Abs: 1.5 10*3/uL (ref 1.0–3.6)
MCH: 27.5 pg (ref 26.0–34.0)
MCHC: 32.2 g/dL (ref 32.0–36.0)
MCV: 85.5 fL (ref 80.0–100.0)
MONO ABS: 0.9 10*3/uL (ref 0.2–0.9)
MONOS PCT: 18 %
NEUTROS ABS: 2.3 10*3/uL (ref 1.4–6.5)
NEUTROS PCT: 47 %
Platelets: 329 10*3/uL (ref 150–440)
RBC: 3.65 MIL/uL — ABNORMAL LOW (ref 3.80–5.20)
RDW: 16.5 % — AB (ref 11.5–14.5)
WBC: 4.9 10*3/uL (ref 3.6–11.0)

## 2018-02-12 LAB — BASIC METABOLIC PANEL
ANION GAP: 7 (ref 5–15)
BUN: 58 mg/dL — ABNORMAL HIGH (ref 6–20)
CO2: 25 mmol/L (ref 22–32)
Calcium: 8.1 mg/dL — ABNORMAL LOW (ref 8.9–10.3)
Chloride: 98 mmol/L — ABNORMAL LOW (ref 101–111)
Creatinine, Ser: 4.22 mg/dL — ABNORMAL HIGH (ref 0.44–1.00)
GFR calc Af Amer: 11 mL/min — ABNORMAL LOW (ref >60)
GFR calc non Af Amer: 9 mL/min — ABNORMAL LOW (ref >60)
GLUCOSE: 98 mg/dL (ref 65–99)
POTASSIUM: 6.2 mmol/L — AB (ref 3.5–5.1)
Sodium: 130 mmol/L — ABNORMAL LOW (ref 135–145)

## 2018-02-12 MED ORDER — HEPARIN SODIUM (PORCINE) 5000 UNIT/ML IJ SOLN
5000.0000 [IU] | Freq: Three times a day (TID) | INTRAMUSCULAR | Status: DC
  Administered 2018-02-12 – 2018-02-13 (×2): 5000 [IU] via SUBCUTANEOUS
  Filled 2018-02-12 (×2): qty 1

## 2018-02-12 MED ORDER — DORZOLAMIDE HCL 2 % OP SOLN
1.0000 [drp] | Freq: Two times a day (BID) | OPHTHALMIC | Status: DC
  Administered 2018-02-12 – 2018-02-13 (×2): 1 [drp] via OPHTHALMIC
  Filled 2018-02-12: qty 10

## 2018-02-12 MED ORDER — SODIUM CHLORIDE 0.9% FLUSH
3.0000 mL | Freq: Two times a day (BID) | INTRAVENOUS | Status: DC
  Administered 2018-02-12 – 2018-02-13 (×2): 3 mL via INTRAVENOUS

## 2018-02-12 MED ORDER — MIRTAZAPINE 15 MG PO TABS
7.5000 mg | ORAL_TABLET | Freq: Every day | ORAL | Status: DC
  Administered 2018-02-12: 7.5 mg via ORAL
  Filled 2018-02-12: qty 1

## 2018-02-12 MED ORDER — ALBUMIN HUMAN 25 % IV SOLN
12.5000 g | Freq: Once | INTRAVENOUS | Status: DC
  Filled 2018-02-12 (×2): qty 50

## 2018-02-12 MED ORDER — HYDROCODONE-ACETAMINOPHEN 5-325 MG PO TABS: 1.0000 | ORAL_TABLET | Freq: Four times a day (QID) | ORAL | Status: DC | PRN

## 2018-02-12 MED ORDER — BRINZOLAMIDE 1 % OP SUSP
1.0000 [drp] | Freq: Every day | OPHTHALMIC | Status: DC
  Administered 2018-02-12: 1 [drp] via OPHTHALMIC
  Filled 2018-02-12: qty 10

## 2018-02-12 MED ORDER — ACETAMINOPHEN 650 MG RE SUPP: 650.0000 mg | Freq: Four times a day (QID) | RECTAL | Status: DC | PRN

## 2018-02-12 MED ORDER — ONDANSETRON HCL 4 MG PO TABS: 4.0000 mg | ORAL_TABLET | Freq: Four times a day (QID) | ORAL | Status: DC | PRN

## 2018-02-12 MED ORDER — ACETAMINOPHEN 325 MG PO TABS
650.0000 mg | ORAL_TABLET | Freq: Four times a day (QID) | ORAL | Status: DC | PRN
  Administered 2018-02-12: 650 mg via ORAL
  Filled 2018-02-12: qty 2

## 2018-02-12 MED ORDER — SODIUM CHLORIDE (HYPERTONIC) 5 % OP OINT
1.0000 "application " | TOPICAL_OINTMENT | Freq: Three times a day (TID) | OPHTHALMIC | Status: DC
  Filled 2018-02-12: qty 3.5

## 2018-02-12 MED ORDER — BISOPROLOL FUMARATE 5 MG PO TABS
5.0000 mg | ORAL_TABLET | Freq: Every day | ORAL | Status: DC
  Administered 2018-02-12: 5 mg via ORAL
  Filled 2018-02-12 (×2): qty 1

## 2018-02-12 MED ORDER — SODIUM CHLORIDE 0.9 % IV SOLN: 250.0000 mL | INTRAVENOUS | Status: DC | PRN

## 2018-02-12 MED ORDER — SODIUM CHLORIDE 0.9% FLUSH: 3.0000 mL | INTRAVENOUS | Status: DC | PRN

## 2018-02-12 MED ORDER — DICLOFENAC SODIUM 1 % TD GEL
2.0000 g | Freq: Four times a day (QID) | TRANSDERMAL | Status: DC
  Administered 2018-02-12 – 2018-02-13 (×3): 2 g via TOPICAL
  Filled 2018-02-12: qty 100

## 2018-02-12 MED ORDER — LATANOPROST 0.005 % OP SOLN
1.0000 [drp] | Freq: Every day | OPHTHALMIC | Status: DC
  Administered 2018-02-12: 1 [drp] via OPHTHALMIC
  Filled 2018-02-12: qty 2.5

## 2018-02-12 MED ORDER — RENA-VITE PO TABS
1.0000 | ORAL_TABLET | Freq: Every day | ORAL | Status: DC
  Administered 2018-02-12: 1 via ORAL
  Filled 2018-02-12 (×2): qty 1

## 2018-02-12 MED ORDER — ASPIRIN EC 81 MG PO TBEC
81.0000 mg | DELAYED_RELEASE_TABLET | Freq: Every day | ORAL | Status: DC
  Administered 2018-02-13: 81 mg via ORAL
  Filled 2018-02-12: qty 1

## 2018-02-12 MED ORDER — LEVOTHYROXINE SODIUM 25 MCG PO TABS
25.0000 ug | ORAL_TABLET | Freq: Every day | ORAL | Status: DC
  Administered 2018-02-13: 25 ug via ORAL
  Filled 2018-02-12: qty 1

## 2018-02-12 MED ORDER — MIDODRINE HCL 5 MG PO TABS
5.0000 mg | ORAL_TABLET | ORAL | Status: DC
  Administered 2018-02-13: 5 mg via ORAL
  Filled 2018-02-12: qty 1

## 2018-02-12 MED ORDER — ONDANSETRON HCL 4 MG/2ML IJ SOLN: 4.0000 mg | Freq: Four times a day (QID) | INTRAMUSCULAR | Status: DC | PRN

## 2018-02-12 MED ORDER — BRIMONIDINE TARTRATE 0.2 % OP SOLN
1.0000 [drp] | Freq: Every day | OPHTHALMIC | Status: DC
  Administered 2018-02-13: 1 [drp] via OPHTHALMIC
  Filled 2018-02-12: qty 5

## 2018-02-12 MED ORDER — ETHYL CHLORIDE EX AERO: 1.0000 "application " | INHALATION_SPRAY | CUTANEOUS | Status: DC

## 2018-02-12 MED ORDER — EPOETIN ALFA 10000 UNIT/ML IJ SOLN: 4000.0000 [IU] | INTRAMUSCULAR | Status: DC

## 2018-02-12 NOTE — Progress Notes (Signed)
Advanced care plan.  Purpose of the Encounter: CODE STATUS  Parties in Attendance: Patient herself  Patient's Decision Capacity: Intact  Subjective/Patient's story: Patient 77 year old white female with end-stage renal disease, hypertension hypothyroidism, congestive heart failure and cardiomegaly presenting to the ER after missing 3 dialysis.  Noted to have hyperkalemia and fluid overload   Objective/Medical story I discussed with the patient regarding her CODE STATUS, I explained her about CPR, and intubation.   Goals of care determination:  Patient states that she wants everything done possible   CODE STATUS: Full code   Time spent discussing advanced care planning: 16 minutes

## 2018-02-12 NOTE — Progress Notes (Signed)
[facility], [place] 02/12/18  Subjective:   Patient known to Ann Cunningham from outpatient dialysis States that she missed dialysis for a week because of ongoing diarrhea at the nursing home She now presents to the emergency room for evaluation Potassium is noted to be high at 6.2 Urgent dialysis is requested  Objective:  Vital signs in last 24 hours:  Temp:  [97.5 F (36.4 C)-97.8 F (36.6 C)] 97.8 F (36.6 C) (05/21 1609) Pulse Rate:  [75-86] 86 (05/21 1609) Resp:  [16-18] 18 (05/21 1609) BP: (110-144)/(55-91) 144/91 (05/21 1609) SpO2:  [100 %] 100 % (05/21 1609) Weight:  [57.6 kg (127 lb)-59.1 kg (130 lb 4.7 oz)] 59.1 kg (130 lb 4.7 oz) (05/21 1609)  Weight change:  Filed Weights   02/12/18 1309 02/12/18 1609  Weight: 57.6 kg (127 lb) 59.1 kg (130 lb 4.7 oz)    Intake/Output:   No intake or output data in the 24 hours ending 02/12/18 1651   Physical Exam: General: Thin, cachectic, laying in the bed  HEENT Moist oral mucous membranes  Neck Distended neck veins  Pulm/lungs Mild basilar crackles  CVS/Heart tachycardic  Abdomen:  Soft, nontender  Extremities: 2+ edema bilaterally  Neurologic: Lethargic but arousable and able to answer questions  Skin: Dry skin  Access: Forearm AV fistula       Basic Metabolic Panel:  Recent Labs  Lab 02/12/18 1317  NA 130*  K 6.2*  CL 98*  CO2 25  GLUCOSE 98  BUN 58*  CREATININE 4.22*  CALCIUM 8.1*     CBC: Recent Labs  Lab 02/12/18 1317  WBC 4.9  NEUTROABS 2.3  HGB 10.1*  HCT 31.2*  MCV 85.5  PLT 329      Lab Results  Component Value Date   HEPBSAG Negative 08/15/2017   HEPBSAB Non Reactive 08/15/2017   HEPBIGM Negative 08/15/2017      Microbiology:  No results found for this or any previous visit (from the past 240 hour(s)).  Coagulation Studies: No results for input(s): LABPROT, INR in the last 72 hours.  Urinalysis: No results for input(s): COLORURINE, LABSPEC, PHURINE,  GLUCOSEU, HGBUR, BILIRUBINUR, KETONESUR, PROTEINUR, UROBILINOGEN, NITRITE, LEUKOCYTESUR in the last 72 hours.  Invalid input(s): APPERANCEUR    Imaging: Dg Chest 2 View  Result Date: 02/12/2018 CLINICAL DATA:  Shortness of breath EXAM: CHEST - 2 VIEW COMPARISON:  01/10/2018 FINDINGS: Interval removal of right side dialysis catheter. Moderate bilateral pleural effusions. Cardiomegaly with vascular congestion. Interstitial prominence and bilateral lower lobe airspace opacities, likely a combination of edema and atelectasis. IMPRESSION: Moderate bilateral pleural effusions. Cardiomegaly with vascular congestion and suspected mild interstitial edema superimposed on bibasilar atelectasis. Electronically Signed   By: Charlett Nose M.D.   On: 02/12/2018 13:43     Medications:   . sodium chloride    . albumin human     . [START ON 02/13/2018] aspirin EC  81 mg Oral Daily  . bisoprolol  5 mg Oral QHS  . brimonidine  1 drop Both Eyes Daily  . brinzolamide  1 drop Both Eyes QHS  . diclofenac sodium  2 g Topical QID  . dorzolamide  1 drop Both Eyes BID  . heparin  5,000 Units Subcutaneous Q8H  . latanoprost  1 drop Both Eyes QHS  . [START ON 02/13/2018] levothyroxine  25 mcg Oral QAC breakfast  . [START ON 02/13/2018] midodrine  5 mg Oral Once per day on Mon Wed Fri  . mirtazapine  7.5 mg  Oral QHS  . multivitamin  1 tablet Oral QHS  . sodium chloride  1 application Left Eye TID  . sodium chloride flush  3 mL Intravenous Q12H   sodium chloride, acetaminophen **OR** acetaminophen, HYDROcodone-acetaminophen, ondansetron **OR** ondansetron (ZOFRAN) IV, sodium chloride flush  Assessment/ Plan:  77 y.o. female with end-stage renal disease, cardiomyopathy, congestive heart failure, glaucoma, hypothyroidism presents for admission after having missed 3 dialysis treatments due to diarrhea  Tuesday/Thursday/Saturday/225 min./58.5 kg/ [name] dialysis/ Forearm AVF  1.  End-stage renal disease 2.   Hyperkalemia 3.  Diarrhea 4.  Anemia of chronic kidney disease 5.  Secondary hyperparathyroidism  Urgent hemodialysis today 17-gauge needles due to new fistula 1 [address] [place] but slow blood flow rate  Monitor potassium tomorrow EPO with hemodialysis Hold laxatives     LOS: 0 Ann Cunningham 5/21/20194:51 PM  [facility] [place], [place] [phone]  Note: This note was prepared with Dragon dictation. Any transcription errors are unintentional

## 2018-02-12 NOTE — Progress Notes (Signed)
Post HD assessment, no change in LOC from initital assessment, pt reports feeling better post tx.    02/12/18 2135  Neurological  Level of Consciousness Alert  Orientation Level Oriented X4  Respiratory  Respiratory Pattern Regular;Unlabored  Chest Assessment Chest expansion symmetrical  Bilateral Breath Sounds Diminished  Cough None  Cardiac  Pulse Regular  Heart Sounds S1, S2  ECG Monitor Yes  Vascular  R Radial Pulse +1  L Radial Pulse +1  Edema Generalized  Psychosocial  Psychosocial (WDL) WDL

## 2018-02-12 NOTE — ED Triage Notes (Signed)
Pt arrives via [facility] from [facility] for missing dialysis appointments x 3 times d/t having a stomach bug with diarrhea. Pt is alert and oriented. States a little bit of SOB. EMS states lung sounds clear. 96% on RA. EMS reports a little big of leg edema. No distress noted upon arrival. Fistula in L arm. HR 80, BP 129/70. Goes [place], [place], Sat to [place] in [place].

## 2018-02-12 NOTE — Progress Notes (Signed)
HD Tx completed, tolerated well, ended 23 minutes early for elevated art/ven pressures and multiple alarms.    02/12/18 2130  Vital Signs  Temp (!) 97.4 F (36.3 C)  Temp Source Oral  Pulse Rate 72  Pulse Rate Source Monitor  Resp 16  BP (!) 144/69  BP Location Left Arm  BP Method Automatic  Patient Position (if appropriate) Lying  Pain Assessment  Pain Scale 0-10  Pain Score 0  Dialysis Weight  Weight 58.8 kg (129 lb 10.1 oz)  Type of Weight Post-Dialysis  During Hemodialysis Assessment  Blood Flow Rate (mL/min) 225 mL/min  Arterial Pressure (mmHg) -250 mmHg  Venous Pressure (mmHg) 280 mmHg  Transmembrane Pressure (mmHg) 110 mmHg  Ultrafiltration Rate (mL/min) 500 mL/min  Dialysate Flow Rate (mL/min) 500 ml/min  Conductivity: Machine  14  HD Safety Checks Performed Yes  Intra-Hemodialysis Comments Tx completed;Tolerated well  Post-Hemodialysis Assessment  Rinseback Volume (mL) 250 mL  Dialyzer Clearance Lightly streaked  Duration of HD Treatment -hour(s) 2.75 hour(s)  Hemodialysis Intake (mL) 500 mL  UF Total -Machine (mL) 1007 mL  Net UF (mL) 507 mL  Tolerated HD Treatment Yes  Post-Hemodialysis Comments  (Tx ended w/ 26 minutes left, due to elevated art/ven pressur)  AVG/AVF Arterial Site Held (minutes) 10 minutes  AVG/AVF Venous Site Held (minutes) 10 minutes

## 2018-02-12 NOTE — H&P (Signed)
[employer] [facility] - [place] at [facility]   PATIENT NAME: Ann Cunningham    MR#:  [id number]  DATE OF BIRTH:  12-11-1940  DATE OF ADMISSION:  02/12/2018  PRIMARY CARE PHYSICIAN: [facility]   REQUESTING/REFERRING PHYSICIAN: Winn Jock MD  CHIEF COMPLAINT:   Chief Complaint  Patient presents with  . Dialysis    HISTORY OF PRESENT ILLNESS: Ann Cunningham  is a 77 y.o. female with a known history of end-stage renal disease, cardiomyopathy, congestive heart failure, glaucoma, hypertension, hypothyroidism who is presenting from a skilled nursing facility.  Patient had missed 3 of her dialysis days due to persistent diarrhea.  Patient states that she had severe diarrhea for the past 1 week and was unable to go to her dialysis.  She was sent from the facility due to missed dialysis and not feeling well.  Patient in the ER is noted to have hyperkalemia and is noted to have bilateral congestive heart failure.  Patient otherwise denies any fevers, chills.  She states that her diarrhea is now resolved since yesterday.  Denies any chest pain or palpitations  PAST MEDICAL HISTORY:   Past Medical History:  Diagnosis Date  . Anemia   . Cardiomyopathy (HCC)   . CHF (congestive heart failure) (HCC)   . CKD (chronic kidney disease)   . Glaucoma   . Hypertension   . Hypothyroidism   . Pressure ulcer of sacrum   . Renal dialysis device, implant, or graft complication   . Renal disorder   . Uses walker     PAST SURGICAL HISTORY:  Past Surgical History:  Procedure Laterality Date  . BASCILIC VEIN TRANSPOSITION Right 10/19/2017   Procedure: Radiocephalic fistula creation;  Surgeon: Renford Dills, MD;  Location: [facility] ORS;  Service: Vascular;  Laterality: Right;  . CAPD INSERTION N/A 08/15/2017   Procedure: LAPAROSCOPIC INSERTION CONTINUOUS AMBULATORY PERITONEAL DIALYSIS  (CAPD) CATHETER;  Surgeon: Renford Dills, MD;  Location: [facility] OR[facility];  Service: Vascular;   Laterality: N/A;  . CATARACT EXTRACTION W/ INTRAOCULAR LENS IMPLANT    . CATARACT EXTRACTION W/PHACO Right 12/25/2016   Procedure: CATARACT EXTRACTION PHACO AND INTRAOCULAR LENS PLACEMENT (IOC)  Right complicated;  Surgeon: Sherald Hess, MD;  Location: [facility];  Service: Ophthalmology;  Laterality: Right;  Vision Blue  . DIALYSIS/PERMA CATHETER INSERTION N/A 08/20/2017   Procedure: DIALYSIS/PERMA CATHETER INSERTION;  Surgeon: Annice Needy, MD;  Location: [facility] INVASIVE CV LAB;  Service: Cardiovascular;  Laterality: N/A;  . DIALYSIS/PERMA CATHETER REMOVAL N/A 02/04/2018   Procedure: DIALYSIS/PERMA CATHETER REMOVAL;  Surgeon: Annice Needy, MD;  Location: [facility] INVASIVE CV LAB;  Service: Cardiovascular;  Laterality: N/A;  . REMOVAL OF A DIALYSIS CATHETER N/A 10/19/2017   Procedure: REMOVAL OF A DIALYSIS CATHETER;  Surgeon: Renford Dills, MD;  Location: [facility];  Service: Vascular;  Laterality: N/A;    SOCIAL HISTORY:  Social History   Tobacco Use  . Smoking status: Former Smoker    Packs/day: 0.50    Types: Cigarettes    Last attempt to quit: 06/14/2017    Years since quitting: 0.6  . Smokeless tobacco: Former Neurosurgeon    Types: Snuff  Substance Use Topics  . Alcohol use: No    FAMILY HISTORY:  Family History  Problem Relation Age of Onset  . Hypertension Mother   . CVA Mother   . CAD Father     DRUG ALLERGIES:  Allergies  Allergen Reactions  . Penicillins Other (See Comments)  Has patient had a PCN reaction causing immediate rash, facial/tongue/throat swelling, SOB or lightheadedness with hypotension: Unknown Has patient had a PCN reaction causing severe rash involving mucus membranes or skin necrosis: Unknown Has patient had a PCN reaction that required hospitalization: Unknown Has patient had a PCN reaction occurring within the last 10 years: Unknown If all of the above answers are "NO", then may proceed with Cephalosporin use.     REVIEW OF  SYSTEMS:   CONSTITUTIONAL: No fever, positive fatigue or positive weakness.  EYES: No blurred or double vision.  EARS, NOSE, AND THROAT: No tinnitus or ear pain.  RESPIRATORY: No cough, shortness of breath, wheezing or hemoptysis.  CARDIOVASCULAR: No chest pain, orthopnea, edema.  GASTROINTESTINAL: No nausea, vomiting, diarrhea or abdominal pain.  GENITOURINARY: No dysuria, hematuria.  ENDOCRINE: No polyuria, nocturia,  HEMATOLOGY: No anemia, easy bruising or bleeding SKIN: No rash or lesion. MUSCULOSKELETAL: No joint pain or arthritis.   NEUROLOGIC: No tingling, numbness, weakness.  PSYCHIATRY: No anxiety or depression.   MEDICATIONS AT HOME:  Prior to Admission medications   Medication Sig Start Date End Date Taking? Authorizing Provider  aspirin 81 MG tablet Take 81 mg by mouth daily.   Yes [provider]  bisacodyl (BISACODYL) 5 MG EC tablet Take 10 mg by mouth daily.   Yes [provider]  bisacodyl (DULCOLAX) 10 MG suppository Place 20 mg rectally 3 (three) times daily. Administer at bedtime every Tuesday, Thursday and Saturday   Yes [provider]  brimonidine (ALPHAGAN) 0.2 % ophthalmic solution Place 1 drop into both eyes daily as needed (glaucoma). Patient taking differently: Place 1 drop into both eyes every 8 (eight) hours as needed (glaucoma).  08/22/17  Yes Wieting, Richard, MD  Brinzolamide-Brimonidine 1-0.2 % SUSP Apply 1 drop to eye 3 (three) times daily.   Yes [provider]  diclofenac sodium (VOLTAREN) 1 % GEL Apply 2 g topically 4 (four) times daily. Apply to right shoulder   Yes [provider]  dorzolamide (TRUSOPT) 2 % ophthalmic solution Place 1 drop into both eyes 2 (two) times daily.   Yes [provider]  ethyl chloride spray Apply 1 application topically 3 (three) times a week. Tuesday, Thursday and Saturday   Yes [provider]  HYDROcodone-acetaminophen (NORCO) 5-325 MG tablet Take 1-2  tablets by mouth every 6 (six) hours as needed. 01/11/18  Yes Sainani, Rolly Pancake, MD  latanoprost (XALATAN) 0.005 % ophthalmic solution Place 1 drop into both eyes at bedtime. 12/17/17  Yes [provider]  levothyroxine (SYNTHROID, LEVOTHROID) 25 MCG tablet Take 25 mcg by mouth daily before breakfast.   Yes [provider]  midodrine (PROAMATINE) 5 MG tablet Take 5 mg by mouth 3 (three) times a week. Give prior to dialysis on Tuesdays, Thursdays and Saturdays.   Yes [provider]  mirtazapine (REMERON) 7.5 MG tablet Take 1 tablet by mouth at bedtime. 12/11/17  Yes [provider]  multivitamin (RENA-VIT) TABS tablet Take 1 tablet by mouth at bedtime. 08/22/17  Yes Wieting, Richard, MD  polyethylene glycol (MIRALAX / GLYCOLAX) packet Take 17 g by mouth daily.   Yes [provider]  sodium chloride (MURO 128) 5 % ophthalmic ointment Place 1 application into the left eye 3 (three) times daily. 10/01/15  Yes [provider]  spironolactone (ALDACTONE) 25 MG tablet Take 1 tablet by mouth every Monday, Wednesday, and Friday. 11/30/17  Yes [provider]  bisoprolol (ZEBETA) 5 MG tablet Take 1 tablet (  5 mg total) by mouth at bedtime. Patient not taking: Reported on 02/12/2018 08/22/17   Alford Highland, MD  brinzolamide (AZOPT) 1 % ophthalmic suspension Place 1 drop into both eyes at bedtime. Patient not taking: Reported on 02/12/2018 08/22/17   Alford Highland, MD      PHYSICAL EXAMINATION:   VITAL SIGNS: Blood pressure (!) 110/55, pulse 79, temperature (!) 97.5 F (36.4 C), temperature source Oral, resp. rate 16, height 5' (1.524 m), weight 57.6 kg (127 lb), SpO2 100 %.  GENERAL:  77 y.o.-year-old patient lying in the bed with no acute distress.  EYES: Pupils equal, round, reactive to light and accommodation. No scleral icterus. Extraocular muscles intact.  HEENT: Head atraumatic, normocephalic. Oropharynx and nasopharynx clear.  NECK:   Supple, no jugular venous distention. No thyroid enlargement, no tenderness.  LUNGS: Normal breath sounds bilaterally, no wheezing, rales,rhonchi or crepitation. No use of accessory muscles of respiration.  CARDIOVASCULAR: S1, S2 normal. No murmurs, rubs, or gallops.  ABDOMEN: Soft, nontender, nondistended. Bowel sounds present. No organomegaly or mass.  EXTREMITIES: No pedal edema, cyanosis, or clubbing.  NEUROLOGIC: Cranial nerves II through XII are intact. Muscle strength 5/5 in all extremities. Sensation intact. Gait not checked.  PSYCHIATRIC: The patient is alert and oriented x 3.  SKIN: No obvious rash, lesion, or ulcer.   LABORATORY PANEL:   CBC Recent Labs  Lab 02/12/18 1317  WBC 4.9  HGB 10.1*  HCT 31.2*  PLT 329  MCV 85.5  MCH 27.5  MCHC 32.2  RDW 16.5*  LYMPHSABS 1.5  MONOABS 0.9  EOSABS 0.2  BASOSABS 0.0   ------------------------------------------------------------------------------------------------------------------  Chemistries  Recent Labs  Lab 02/12/18 1317  NA 130*  K 6.2*  CL 98*  CO2 25  GLUCOSE 98  BUN 58*  CREATININE 4.22*  CALCIUM 8.1*   ------------------------------------------------------------------------------------------------------------------ estimated creatinine clearance is 9 mL/min (A) (by C-G formula based on SCr of 4.22 mg/dL (H)). ------------------------------------------------------------------------------------------------------------------ No results for input(s): TSH, T4TOTAL, T3FREE, THYROIDAB in the last 72 hours.  Invalid input(s): FREET3   Coagulation profile No results for input(s): INR, PROTIME in the last 168 hours. ------------------------------------------------------------------------------------------------------------------- No results for input(s): DDIMER in the last 72 hours. -------------------------------------------------------------------------------------------------------------------  Cardiac  Enzymes No results for input(s): CKMB, TROPONINI, MYOGLOBIN in the last 168 hours.  Invalid input(s): CK ------------------------------------------------------------------------------------------------------------------ Invalid input(s): POCBNP  ---------------------------------------------------------------------------------------------------------------  Urinalysis No results found for: COLORURINE, APPEARANCEUR, LABSPEC, PHURINE, GLUCOSEU, HGBUR, BILIRUBINUR, KETONESUR, PROTEINUR, UROBILINOGEN, NITRITE, LEUKOCYTESUR   RADIOLOGY: Dg Chest 2 View  Result Date: 02/12/2018 CLINICAL DATA:  Shortness of breath EXAM: CHEST - 2 VIEW COMPARISON:  01/10/2018 FINDINGS: Interval removal of right side dialysis catheter. Moderate bilateral pleural effusions. Cardiomegaly with vascular congestion. Interstitial prominence and bilateral lower lobe airspace opacities, likely a combination of edema and atelectasis. IMPRESSION: Moderate bilateral pleural effusions. Cardiomegaly with vascular congestion and suspected mild interstitial edema superimposed on bibasilar atelectasis. Electronically Signed   By: Charlett Nose M.D.   On: 02/12/2018 13:43    EKG: Orders placed or performed during the hospital encounter of 02/12/18  . ED EKG  . ED EKG    IMPRESSION AND PLAN: Patient 77 year old African-American female on end-stage renal disease being admitted with hyperkalemia  1.  Hyperkalemia due to missing dialysis Patient will be dialyzed per nephrology Discontinue spironolactone for now  2.  Bilateral pleural effusions with fluid overload due to missing dialysis fluid removal per nephrology  3.  Hypothyroidism continue Synthroid  4.  Recent diarrhea discontinue all stool softeners  5.  Glaucoma continue eyedrops  6.  Hypotension with dialysis continue midodrine with hemodialysis  7.  Miscellaneous heparin for DVT prophylaxis    All the records are reviewed and case discussed with ED  provider. Management plans discussed with the patient, family and they are in agreement.  CODE STATUS: Code Status History    Date Active Date Inactive Code Status Order ID Comments User Context   01/10/2018 1536 01/13/2018 0045 Full Code [id number]  Alford Highland, MD ED   10/19/2017 1310 10/20/2017 2106 Full Code [id number]  Adrian Saran, MD Inpatient   08/17/2017 2122 08/22/2017 2014 DNR [id number]  Alford Highland, MD ED   08/14/2017 0459 08/15/2017 1941 Full Code [id number]  Arnaldo Natal, MD Inpatient       TOTAL TIME TAKING CARE OF THIS PATIENT: 55 minutes.    Auburn Bilberry M.D on 02/12/2018 at 3:20 PM  Between 7am to 6pm - Pager - [phone]  After 6pm go to [email or web address] - Social research officer, government  [employer] [facility] Office  [phone]  CC: Primary care physician; [facility]

## 2018-02-12 NOTE — Progress Notes (Signed)
Pre HD tx    02/12/18 1836  Vital Signs  Pulse Rate 86  Pulse Rate Source Monitor  Resp 18  BP 121/63  BP Location Left Arm  BP Method Automatic  Patient Position (if appropriate) Lying  Oxygen Therapy  SpO2 100 %  O2 Device Room Air  Dialysis Weight  Weight 59.1 kg (130 lb 4.7 oz)  Type of Weight Pre-Dialysis  Time-Out for Hemodialysis  What Procedure? HD  Pt Identifiers(min of two) First/Last Name;MRN/Account#  Correct Site? Yes  Correct Side? Yes  Correct Procedure? Yes  Consents Verified? Yes  Rad Studies Available? N/A  Safety Precautions Reviewed? Yes  [employer] Number [id number]  Station Number 1  UF/Alarm Test Passed  Conductivity: Meter 14  Conductivity: Machine  13.9  pH 7.2  Reverse Osmosis Main  Normal Saline Lot Number [id number]  Dialyzer Lot Number [id number]  Disposable Set Lot Number [id number]  Machine Temperature 98.6 F (37 C)  Immunologist and Audible Yes  Blood Lines Intact and Secured Yes  Pre Treatment Patient Checks  Vascular access used during treatment Fistula  Hepatitis B Surface Antigen Results  (unk)  Isolation Initiated Yes  Hepatitis B Surface Antibody 0  Hemodialysis Consent Verified Yes  Hemodialysis Standing Orders Initiated Yes  ECG (Telemetry) Monitor On Yes  Prime Ordered Normal Saline  Length of  DialysisTreatment -hour(s) 3 Hour(s)  Dialysis Treatment Comments Na 140  Dialyzer Elisio 17H NR  Dialysate 1K  Dialysis Anticoagulant None  Dialysate Flow Ordered 500  Blood Flow Rate Ordered 250 mL/min  Ultrafiltration Goal 0.5 Liters  Pre Treatment Labs Hepatitis B Surface Antigen  Dialysis Blood Pressure Support Ordered Albumin  Education / Care Plan  Dialysis Education Provided Yes  Documented Education in Care Plan Yes  Fistula / Graft Right Forearm Arteriovenous fistula  No Placement Date or Time found.   Placed prior to admission: Yes  Orientation: Right  Access Location: Forearm  Access Type: Arteriovenous  fistula  Site Condition Other (Comment) (large hematomoa and tortuous access , art/ven pressurse high)  Fistula / Graft Assessment Thrill;Bruit;Present  Status Patent  Needle Size 17 g

## 2018-02-12 NOTE — Progress Notes (Signed)
Pre HD Assessment    02/12/18 1830  Neurological  Level of Consciousness Alert  Orientation Level Oriented X4  Respiratory  Respiratory Pattern Regular;Unlabored  Chest Assessment Chest expansion symmetrical  Bilateral Breath Sounds Diminished  Cough None  Cardiac  Pulse Regular  Heart Sounds S1, S2  ECG Monitor Yes  Vascular  R Radial Pulse +1  L Radial Pulse +1  Edema Generalized  Psychosocial  Psychosocial (WDL) WDL

## 2018-02-12 NOTE — Progress Notes (Signed)
Pt asymptomatic, stable. Readjusted B/P cuff.

## 2018-02-12 NOTE — Progress Notes (Signed)
HD Tx started, ven/art pressures elevated for needle gauge, pt has hematoma limiting cannulation space, will monitor closely, MD notified.    02/12/18 1836  Hand-Off documentation  Report given to (Full Name) Laretta Alstrom, RN   Report received from (Full Name) Margaretmary Dys, RN   Vital Signs  Pulse Rate 86  Pulse Rate Source Monitor  Resp 18  BP 121/63  BP Location Left Arm  BP Method Automatic  Patient Position (if appropriate) Lying  Oxygen Therapy  SpO2 100 %  O2 Device Room Air  Dialysis Weight  Weight 59.1 kg (130 lb 4.7 oz)  Type of Weight Pre-Dialysis  Time-Out for Hemodialysis  What Procedure? HD  Pt Identifiers(min of two) First/Last Name;MRN/Account#  Correct Site? Yes  Correct Side? Yes  Correct Procedure? Yes  Consents Verified? Yes  Rad Studies Available? N/A  Safety Precautions Reviewed? Yes  [employer] Number [phone]  Station Number 1  UF/Alarm Test Passed  Conductivity: Meter 14  Conductivity: Machine  13.9  pH 7.2  Reverse Osmosis Main  Normal Saline Lot Number [id number]  Dialyzer Lot Number [id number]  Disposable Set Lot Number [id number]  Machine Temperature 98.6 F (37 C)  Immunologist and Audible Yes  Blood Lines Intact and Secured Yes  Pre Treatment Patient Checks  Vascular access used during treatment Fistula  Hepatitis B Surface Antigen Results  (unk)  Isolation Initiated Yes  Hepatitis B Surface Antibody 0  Hemodialysis Consent Verified Yes  Hemodialysis Standing Orders Initiated Yes  ECG (Telemetry) Monitor On Yes  Prime Ordered Normal Saline  Length of  DialysisTreatment -hour(s) 3 Hour(s)  Dialysis Treatment Comments Na 140  Dialyzer Elisio 17H NR  Dialysate 1K  Dialysis Anticoagulant None  Dialysate Flow Ordered 500  Blood Flow Rate Ordered 250 mL/min  Ultrafiltration Goal 0.5 Liters  Pre Treatment Labs Hepatitis B Surface Antigen  Dialysis Blood Pressure Support Ordered Albumin  Education / Care Plan  Dialysis  Education Provided Yes  Documented Education in Care Plan Yes  Fistula / Graft Right Forearm Arteriovenous fistula  No Placement Date or Time found.   Placed prior to admission: Yes  Orientation: Right  Access Location: Forearm  Access Type: Arteriovenous fistula  Site Condition Other (Comment) (large hematomoa and tortuous access , art/ven pressurse high)  Fistula / Graft Assessment Thrill;Bruit;Present  Status Patent  Needle Size 17 g

## 2018-02-12 NOTE — ED Notes (Signed)
Admitting MD at bedside.

## 2018-02-12 NOTE — ED Provider Notes (Signed)
[facility] Emergency Department Provider Note  ____________________________________________   First MD Initiated Contact with Patient 02/12/18 1352     (approximate)  I have reviewed the triage vital signs and the nursing notes.   HISTORY  Chief Complaint Dialysis    HPI Ann Cunningham is a 77 y.o. female with extensive past medical history as listed below who is on hemodialysis who presents from her nursing facility with concerns for having missed 3 of her last hemodialysis sessions.  She reportedly had a diarrheal illness last week and so she did not come to any of her dialysis sessions.  She is reporting some increased shortness of breath that seems to be worse when she lies flat.  She says that her diarrhea slowed down or stopped as of yesterday.  She has reportedly not been on any antibiotics recently.  She denies fever/chills, chest pain, nausea, vomiting.  She had some cramping in her abdomen before her bowel movements but that seems to have resolved.  She reports that her shortness of breath is mild and gradual in onset over the last week.  Nothing makes her symptoms better or worse.  Past Medical History:  Diagnosis Date  . Anemia   . Cardiomyopathy (HCC)   . CHF (congestive heart failure) (HCC)   . CKD (chronic kidney disease)   . Glaucoma   . Hypertension   . Hypothyroidism   . Pressure ulcer of sacrum   . Renal dialysis device, implant, or graft complication   . Renal disorder   . Uses walker     Patient Active Problem List   Diagnosis Date Noted  . Hyperkalemia 02/12/2018  . Hypotension 01/10/2018  . Protein-calorie malnutrition, severe 10/20/2017  . Encephalopathy 10/19/2017  . ESRD on dialysis (HCC) 09/12/2017  . Essential hypertension 09/12/2017  . Failure to thrive in adult 08/17/2017  . Dyspnea 08/14/2017    Past Surgical History:  Procedure Laterality Date  . BASCILIC VEIN TRANSPOSITION Right 10/19/2017   Procedure:  Radiocephalic fistula creation;  Surgeon: Renford Dills, MD;  Location: [facility];  Service: Vascular;  Laterality: Right;  . CAPD INSERTION N/A 08/15/2017   Procedure: LAPAROSCOPIC INSERTION CONTINUOUS AMBULATORY PERITONEAL DIALYSIS  (CAPD) CATHETER;  Surgeon: Renford Dills, MD;  Location: [facility];  Service: Vascular;  Laterality: N/A;  . CATARACT EXTRACTION W/ INTRAOCULAR LENS IMPLANT    . CATARACT EXTRACTION W/PHACO Right 12/25/2016   Procedure: CATARACT EXTRACTION PHACO AND INTRAOCULAR LENS PLACEMENT (IOC)  Right complicated;  Surgeon: Sherald Hess, MD;  Location: [facility];  Service: Ophthalmology;  Laterality: Right;  Vision Blue  . DIALYSIS/PERMA CATHETER INSERTION N/A 08/20/2017   Procedure: DIALYSIS/PERMA CATHETER INSERTION;  Surgeon: Annice Needy, MD;  Location: [facility];  Service: Cardiovascular;  Laterality: N/A;  . DIALYSIS/PERMA CATHETER REMOVAL N/A 02/04/2018   Procedure: DIALYSIS/PERMA CATHETER REMOVAL;  Surgeon: Annice Needy, MD;  Location: [facility];  Service: Cardiovascular;  Laterality: N/A;  . REMOVAL OF A DIALYSIS CATHETER N/A 10/19/2017   Procedure: REMOVAL OF A DIALYSIS CATHETER;  Surgeon: Renford Dills, MD;  Location: [facility];  Service: Vascular;  Laterality: N/A;    Prior to Admission medications   Medication Sig Start Date End Date Taking? Authorizing Provider  aspirin 81 MG tablet Take 81 mg by mouth daily.   Yes [provider]  bisacodyl (BISACODYL) 5 MG EC tablet Take 10 mg by mouth daily.   Yes [provider]  bisacodyl (DULCOLAX)  10 MG suppository Place 20 mg rectally 3 (three) times daily. Administer at bedtime every Tuesday, Thursday and Saturday   Yes [provider]  brimonidine (ALPHAGAN) 0.2 % ophthalmic solution Place 1 drop into both eyes daily as needed (glaucoma). Patient taking differently: Place 1 drop into both eyes every 8 (eight) hours as needed (glaucoma).   08/22/17  Yes Wieting, Richard, MD  Brinzolamide-Brimonidine 1-0.2 % SUSP Apply 1 drop to eye 3 (three) times daily.   Yes [provider]  diclofenac sodium (VOLTAREN) 1 % GEL Apply 2 g topically 4 (four) times daily. Apply to right shoulder   Yes [provider]  dorzolamide (TRUSOPT) 2 % ophthalmic solution Place 1 drop into both eyes 2 (two) times daily.   Yes [provider]  ethyl chloride spray Apply 1 application topically 3 (three) times a week. Tuesday, Thursday and Saturday   Yes [provider]  HYDROcodone-acetaminophen (NORCO) 5-325 MG tablet Take 1-2 tablets by mouth every 6 (six) hours as needed. 01/11/18  Yes Sainani, Rolly Pancake, MD  latanoprost (XALATAN) 0.005 % ophthalmic solution Place 1 drop into both eyes at bedtime. 12/17/17  Yes [provider]  levothyroxine (SYNTHROID, LEVOTHROID) 25 MCG tablet Take 25 mcg by mouth daily before breakfast.   Yes [provider]  midodrine (PROAMATINE) 5 MG tablet Take 5 mg by mouth 3 (three) times a week. Give prior to dialysis on Tuesdays, Thursdays and Saturdays.   Yes [provider]  mirtazapine (REMERON) 7.5 MG tablet Take 1 tablet by mouth at bedtime. 12/11/17  Yes [provider]  multivitamin (RENA-VIT) TABS tablet Take 1 tablet by mouth at bedtime. 08/22/17  Yes Wieting, Richard, MD  polyethylene glycol (MIRALAX / GLYCOLAX) packet Take 17 g by mouth daily.   Yes [provider]  sodium chloride (MURO 128) 5 % ophthalmic ointment Place 1 application into the left eye 3 (three) times daily. 10/01/15  Yes [provider]  spironolactone (ALDACTONE) 25 MG tablet Take 1 tablet by mouth every Monday, Wednesday, and Friday. 11/30/17  Yes [provider]  bisoprolol (ZEBETA) 5 MG tablet Take 1 tablet (5 mg total) by mouth at bedtime. Patient not taking: Reported on 02/12/2018 08/22/17   Alford Highland, MD  brinzolamide (AZOPT) 1 % ophthalmic suspension  Place 1 drop into both eyes at bedtime. Patient not taking: Reported on 02/12/2018 08/22/17   Alford Highland, MD    Allergies Penicillins  Family History  Problem Relation Age of Onset  . Hypertension Mother   . CVA Mother   . CAD Father     Social History Social History   Tobacco Use  . Smoking status: Former Smoker    Packs/day: 0.50    Types: Cigarettes    Last attempt to quit: 06/14/2017    Years since quitting: 0.6  . Smokeless tobacco: Former Neurosurgeon    Types: Snuff  Substance Use Topics  . Alcohol use: No  . Drug use: No    Review of Systems Constitutional: No fever/chills Eyes: No visual changes. ENT: No sore throat. Cardiovascular: Denies chest pain. Respiratory: Mild shortness of breath as described above Gastrointestinal: No abdominal pain.  No nausea, no vomiting.  No diarrhea.  No constipation. Genitourinary: Negative for dysuria. Musculoskeletal: Negative for neck pain.  Negative for back pain. Integumentary: Negative for rash. Neurological: Negative for headaches, focal weakness or numbness.   ____________________________________________   PHYSICAL EXAM:  VITAL SIGNS: ED Triage Vitals  Enc Vitals Group  BP 02/12/18 1312 (!) 110/55     Pulse Rate 02/12/18 1312 79     Resp 02/12/18 1312 16     Temp 02/12/18 1312 (!) 97.5 F (36.4 C)     Temp Source 02/12/18 1312 Oral     SpO2 02/12/18 1312 100 %     Weight 02/12/18 1309 57.6 kg (127 lb)     Height 02/12/18 1309 1.524 m (5')     Head Circumference --      Peak Flow --      Pain Score 02/12/18 1309 0     Pain Loc --      Pain Edu? --      Excl. in GC? --     Constitutional: Alert and oriented.  Appears frail and elderly. Eyes: Conjunctivae are normal.  Head: Atraumatic. Nose: No congestion/rhinnorhea. Mouth/Throat: Mucous membranes are moist. Neck: No stridor.  No meningeal signs.   Cardiovascular: Normal rate, regular rhythm. Good peripheral circulation. Grossly normal heart  sounds. Respiratory: Normal respiratory effort.  No retractions. Lungs CTAB. Gastrointestinal: Soft and nontender. No distention.  Musculoskeletal: No lower extremity tenderness nor edema. No gross deformities of extremities. Neurologic:  Normal speech and language. No gross focal neurologic deficits are appreciated.  Skin:  Skin is warm, dry and intact. No rash noted. Psychiatric: Mood and affect are normal. Speech and behavior are normal.  ____________________________________________   LABS (all labs ordered are listed, but only abnormal results are displayed)  Labs Reviewed  BASIC METABOLIC PANEL - Abnormal; Notable for the following components:      Result Value   Sodium 130 (*)    Potassium 6.2 (*)    Chloride 98 (*)    BUN 58 (*)    Creatinine, Ser 4.22 (*)    Calcium 8.1 (*)    GFR calc non Af Amer 9 (*)    GFR calc Af Amer 11 (*)    All other components within normal limits  CBC WITH DIFFERENTIAL/PLATELET - Abnormal; Notable for the following components:   RBC 3.65 (*)    Hemoglobin 10.1 (*)    HCT 31.2 (*)    RDW 16.5 (*)    All other components within normal limits  CBC  CREATININE, SERUM   ____________________________________________  EKG  ED ECG REPORT I, Loleta Rose, the attending physician, personally viewed and interpreted this ECG.  Date: 02/12/2018 EKG Time: 14:43 Rate: 75 Rhythm: normal sinus rhythm QRS Axis: normal Intervals: normal ST/T Wave abnormalities: Non-specific ST segment / T-wave changes, but no evidence of acute ischemia. Narrative Interpretation: no evidence of acute ischemia  ____________________________________________  RADIOLOGY I, Loleta Rose, personally viewed and evaluated these images (plain radiographs) as part of my medical decision making, as well as reviewing the written report by the radiologist.  ED MD interpretation:  Pleural effusions, pulmonary vascular congestion  Official radiology report(s): Dg Chest 2  View  Result Date: 02/12/2018 CLINICAL DATA:  Shortness of breath EXAM: CHEST - 2 VIEW COMPARISON:  01/10/2018 FINDINGS: Interval removal of right side dialysis catheter. Moderate bilateral pleural effusions. Cardiomegaly with vascular congestion. Interstitial prominence and bilateral lower lobe airspace opacities, likely a combination of edema and atelectasis. IMPRESSION: Moderate bilateral pleural effusions. Cardiomegaly with vascular congestion and suspected mild interstitial edema superimposed on bibasilar atelectasis. Electronically Signed   By: Charlett Nose M.D.   On: 02/12/2018 13:43    ____________________________________________   PROCEDURES  Critical Care performed: No   Procedure(s) performed:   Procedures   ____________________________________________  INITIAL IMPRESSION / ASSESSMENT AND PLAN / ED COURSE  As part of my medical decision making, I reviewed the following data within the electronic MEDICAL RECORD NUMBER Nursing notes reviewed and incorporated, Labs reviewed , EKG interpreted , Old chart reviewed, Discussed with admitting physician , A consult was requested and obtained from this/these consultant(s) Nephrology and Notes from prior ED visits    Differential diagnosis includes, but is not limited to, volume overload and/or electrolyte and metabolic abnormalities as result of missing dialysis, viral diarrheal illness, C. difficile colitis, other intra-abdominal infection such as diverticulitis or nonspecific colitis.  However the patient has no tenderness to palpation abdomen it sounds as if her diarrhea has stopped as of yesterday and she has no report of being on antibiotics recently.  I think most likely she had a viral illness which led to her feeling weak and not going to dialysis.  Now she has a normal EKG but her potassium is greater than 6 and she has worsening bilateral pleural effusions.  I will consult nephrology and anticipate admission for inpatient dialysis.   No indication for urgent medical treatment right now given the dialysis will solve her issue and she is not in severe distress with stable vital signs.  Clinical Course as of Feb 13 1616  Tue Feb 12, 2018  1440 Dr. Thedore Mins came to the emergency department in person and spoke with me about the patient and evaluated the patient in person.  He recommended speaking with the hospitalist for inpatient admission and will proceed with dialysis when an opportunity is available.  Patient is stable at this time.   [CF]  1453 Spoke with Dr. Eliane Decree with the hospitalist service who will admit.   [CF]    Clinical Course User Index [CF] Loleta Rose, MD    ____________________________________________  FINAL CLINICAL IMPRESSION(S) / ED DIAGNOSES  Final diagnoses:  Chronic bilateral pleural effusions  Hyperkalemia  Non-compliance with renal dialysis (HCC)     MEDICATIONS GIVEN DURING THIS VISIT:  Medications  albumin human 25 % solution 12.5 g (has no administration in time range)  aspirin EC tablet 81 mg (has no administration in time range)  bisoprolol (ZEBETA) tablet 5 mg (has no administration in time range)  brimonidine (ALPHAGAN) 0.2 % ophthalmic solution 1 drop (has no administration in time range)  brinzolamide (AZOPT) 1 % ophthalmic suspension 1 drop (has no administration in time range)  diclofenac sodium (VOLTAREN) 1 % transdermal gel 2 g (has no administration in time range)  dorzolamide (TRUSOPT) 2 % ophthalmic solution 1 drop (has no administration in time range)  ethyl chloride spray 1 application (has no administration in time range)  HYDROcodone-acetaminophen (NORCO/VICODIN) 5-325 MG per tablet 1-2 tablet (has no administration in time range)  latanoprost (XALATAN) 0.005 % ophthalmic solution 1 drop (has no administration in time range)  levothyroxine (SYNTHROID, LEVOTHROID) tablet 25 mcg (has no administration in time range)  midodrine (PROAMATINE) tablet 5 mg (has no  administration in time range)  mirtazapine (REMERON) tablet 7.5 mg (has no administration in time range)  multivitamin (RENA-VIT) tablet 1 tablet (has no administration in time range)  sodium chloride (MURO 128) 5 % ophthalmic ointment 1 application (has no administration in time range)  heparin injection 5,000 Units (has no administration in time range)  sodium chloride flush (NS) 0.9 % injection 3 mL (has no administration in time range)  sodium chloride flush (NS) 0.9 % injection 3 mL (has no administration in time range)  0.9 %  sodium chloride infusion (has no administration in time range)  acetaminophen (TYLENOL) tablet 650 mg (has no administration in time range)    Or  acetaminophen (TYLENOL) suppository 650 mg (has no administration in time range)  ondansetron (ZOFRAN) tablet 4 mg (has no administration in time range)    Or  ondansetron (ZOFRAN) injection 4 mg (has no administration in time range)     ED Discharge Orders    None       Note:  This document was prepared using Dragon voice recognition software and may include unintentional dictation errors.    Loleta Rose, MD 02/12/18 [phone]

## 2018-02-13 DIAGNOSIS — E875 Hyperkalemia: Secondary | ICD-10-CM | POA: Diagnosis not present

## 2018-02-13 LAB — CBC
HCT: 29.2 % — ABNORMAL LOW (ref 35.0–47.0)
Hemoglobin: 9.6 g/dL — ABNORMAL LOW (ref 12.0–16.0)
MCH: 28 pg (ref 26.0–34.0)
MCHC: 32.8 g/dL (ref 32.0–36.0)
MCV: 85.5 fL (ref 80.0–100.0)
PLATELETS: 263 10*3/uL (ref 150–440)
RBC: 3.41 MIL/uL — AB (ref 3.80–5.20)
RDW: 16.2 % — AB (ref 11.5–14.5)
WBC: 4 10*3/uL (ref 3.6–11.0)

## 2018-02-13 LAB — BASIC METABOLIC PANEL
ANION GAP: 6 (ref 5–15)
BUN: 46 mg/dL — ABNORMAL HIGH (ref 6–20)
CALCIUM: 7.8 mg/dL — AB (ref 8.9–10.3)
CO2: 27 mmol/L (ref 22–32)
Chloride: 101 mmol/L (ref 101–111)
Creatinine, Ser: 3.37 mg/dL — ABNORMAL HIGH (ref 0.44–1.00)
GFR, EST AFRICAN AMERICAN: 14 mL/min — AB (ref >60)
GFR, EST NON AFRICAN AMERICAN: 12 mL/min — AB (ref >60)
Glucose, Bld: 83 mg/dL (ref 65–99)
POTASSIUM: 4.8 mmol/L (ref 3.5–5.1)
Sodium: 134 mmol/L — ABNORMAL LOW (ref 135–145)

## 2018-02-13 LAB — MRSA PCR SCREENING: MRSA BY PCR: NEGATIVE

## 2018-02-13 MED ORDER — BISACODYL 5 MG PO TBEC: 10.0000 mg | DELAYED_RELEASE_TABLET | Freq: Every day | ORAL | 0 refills | Status: AC | PRN

## 2018-02-13 NOTE — Progress Notes (Signed)
Pt up for d/c per MD order, Report given to [facility] LPN at [facility]. [facility] has been called for transportation.

## 2018-02-13 NOTE — Clinical Social Work Note (Signed)
Patient to be d/c'ed today to [facility] room 10.  Patient and family agreeable to plans will transport via ems RN to call report [phone].  CSW contacted patient's niece Marzetta Merino [phone] to let her know patient will be discharging back to SNF today.   Windell Moulding, MSW, Theresia Majors [phone]

## 2018-02-13 NOTE — NC FL2 (Signed)
[place] MEDICAID FL2 LEVEL OF CARE SCREENING TOOL     IDENTIFICATION  Patient Name: Ann Cunningham Birthdate: 1941-08-18 Sex: female Admission Date (Current Location): 02/12/2018  [place] and [place] Number:  [address] [id number] Facility and Address:  [facility], [address], [place], [place] [address]      Provider Number: [id number]  Attending Physician Name and Address:  Enedina Finner, MD  Relative Name and Phone Number:  [place],Linda Niece   [phone] or Wade,Vicky Niece   [phone] or Ubaldo Glassing [phone]      Current Level of Care: Hospital Recommended Level of Care: Skilled Nursing Facility Prior Approval Number:    Date Approved/Denied:   PASRR Number: [id number]  Discharge Plan: SNF    Current Diagnoses: Patient Active Problem List   Diagnosis Date Noted  . Hyperkalemia 02/12/2018  . Hypotension 01/10/2018  . Protein-calorie malnutrition, severe 10/20/2017  . Encephalopathy 10/19/2017  . ESRD on dialysis (HCC) 09/12/2017  . Essential hypertension 09/12/2017  . Failure to thrive in adult 08/17/2017  . Dyspnea 08/14/2017    Orientation RESPIRATION BLADDER Height & Weight     Self, Time, Situation, Place  Normal Incontinent Weight: 126 lb 9.6 oz (57.4 kg) Height:  5' (152.4 cm)  BEHAVIORAL SYMPTOMS/MOOD NEUROLOGICAL BOWEL NUTRITION STATUS      Continent Diet(Fluid Restriction Renal diet)  AMBULATORY STATUS COMMUNICATION OF NEEDS Skin   Extensive Assist Verbally Normal                       Personal Care Assistance Level of Assistance  Bathing, Feeding, Dressing Bathing Assistance: Maximum assistance Feeding assistance: Independent Dressing Assistance: Maximum assistance     Functional Limitations Info  Sight, Hearing, Speech Sight Info: Adequate Hearing Info: Adequate Speech Info: Adequate    SPECIAL CARE FACTORS FREQUENCY                       Contractures Contractures Info:  Not present    Additional Factors Info  Code Status, Allergies Code Status Info: Full code Allergies Info: Penicillins           Current Medications (02/13/2018):  This is the current hospital active medication list Current Facility-Administered Medications  Medication Dose Route Frequency Provider Last Rate Last Dose  . 0.9 %  sodium chloride infusion  250 mL Intravenous PRN Auburn Bilberry, MD      . acetaminophen (TYLENOL) tablet 650 mg  650 mg Oral Q6H PRN Auburn Bilberry, MD   650 mg at 02/12/18 2305   Or  . acetaminophen (TYLENOL) suppository 650 mg  650 mg Rectal Q6H PRN Auburn Bilberry, MD      . albumin human 25 % solution 12.5 g  12.5 g Intravenous Once in dialysis Mosetta Pigeon, MD      . aspirin EC tablet 81 mg  81 mg Oral Daily Auburn Bilberry, MD   81 mg at 02/13/18 4332  . bisoprolol (ZEBETA) tablet 5 mg  5 mg Oral QHS Auburn Bilberry, MD   5 mg at 02/12/18 2201  . brimonidine (ALPHAGAN) 0.2 % ophthalmic solution 1 drop  1 drop Both Eyes Daily Auburn Bilberry, MD   1 drop at 02/13/18 0829  . brinzolamide (AZOPT) 1 % ophthalmic suspension 1 drop  1 drop Both Eyes QHS Auburn Bilberry, MD   1 drop at 02/12/18 2204  . diclofenac sodium (VOLTAREN) 1 % transdermal gel 2 g  2 g Topical QID Auburn Bilberry, MD  2 g at 02/12/18 2204  . dorzolamide (TRUSOPT) 2 % ophthalmic solution 1 drop  1 drop Both Eyes BID Auburn Bilberry, MD   1 drop at 02/12/18 2201  . [START ON 02/14/2018] epoetin alfa (EPOGEN,PROCRIT) injection 4,000 Units  4,000 Units Intravenous Q T,Th,Sa-HD Mosetta Pigeon, MD      . heparin injection 5,000 Units  5,000 Units Subcutaneous Q8H Auburn Bilberry, MD   5,000 Units at 02/13/18 [phone]  . HYDROcodone-acetaminophen (NORCO/VICODIN) 5-325 MG per tablet 1-2 tablet  1-2 tablet Oral Q6H PRN Auburn Bilberry, MD      . latanoprost (XALATAN) 0.005 % ophthalmic solution 1 drop  1 drop Both Eyes QHS Auburn Bilberry, MD   1 drop at 02/12/18 2201  . levothyroxine (SYNTHROID,  LEVOTHROID) tablet 25 mcg  25 mcg Oral QAC breakfast Auburn Bilberry, MD   25 mcg at 02/13/18 0827  . midodrine (PROAMATINE) tablet 5 mg  5 mg Oral Once per day on Mon Wed Fri Auburn Bilberry, MD   5 mg at 02/13/18 0827  . mirtazapine (REMERON) tablet 7.5 mg  7.5 mg Oral QHS Auburn Bilberry, MD   7.5 mg at 02/12/18 2201  . multivitamin (RENA-VIT) tablet 1 tablet  1 tablet Oral QHS Auburn Bilberry, MD   1 tablet at 02/12/18 2201  . ondansetron (ZOFRAN) tablet 4 mg  4 mg Oral Q6H PRN Auburn Bilberry, MD       Or  . ondansetron (ZOFRAN) injection 4 mg  4 mg Intravenous Q6H PRN Auburn Bilberry, MD      . sodium chloride (MURO 128) 5 % ophthalmic ointment 1 application  1 application Left Eye TID Auburn Bilberry, MD      . sodium chloride flush (NS) 0.9 % injection 3 mL  3 mL Intravenous Q12H Auburn Bilberry, MD   3 mL at 02/12/18 2207  . sodium chloride flush (NS) 0.9 % injection 3 mL  3 mL Intravenous PRN Auburn Bilberry, MD         Discharge Medications: Please see discharge summary for a list of discharge medications.  Relevant Imaging Results:  Relevant Lab Results:   Additional Information SS# [id number]  [place]

## 2018-02-13 NOTE — Discharge Instructions (Signed)
Resume your HD as before on Tuesday-thurs-sat

## 2018-02-13 NOTE — Progress Notes (Signed)
[facility], [place] 02/13/18  Subjective:   Patient known to [place] from outpatient dialysis States that she missed dialysis for a week because of ongoing diarrhea at the nursing home She now presents to the emergency room for evaluation Potassium is noted to be high at 6.2  Urgent hemodialysis performed yesterday for hyperkalemia Patient states she feels better  potassium level is normal today   Objective:  Vital signs in last 24 hours:  Temp:  [97.4 F (36.3 C)-98.1 F (36.7 C)] 98.1 F (36.7 C) (05/22 0725) Pulse Rate:  [72-86] 72 (05/22 0725) Resp:  [10-18] 17 (05/22 0725) BP: (79-144)/(53-91) 100/61 (05/22 0725) SpO2:  [99 %-100 %] 100 % (05/22 0725) Weight:  [57.4 kg (126 lb 9.6 oz)-59.1 kg (130 lb 4.7 oz)] 57.4 kg (126 lb 9.6 oz) (05/22 0320)  Weight change:  Filed Weights   02/12/18 1836 02/12/18 2130 02/13/18 0320  Weight: 59.1 kg (130 lb 4.7 oz) 58.8 kg (129 lb 10.1 oz) 57.4 kg (126 lb 9.6 oz)    Intake/Output:    Intake/Output Summary (Last 24 hours) at 02/13/2018 1325 Last data filed at 02/13/2018 0700 Gross per 24 hour  Intake 477 ml  Output 507 ml  Net -30 ml     Physical Exam: General: Thin, cachectic, laying in the bed  HEENT Moist oral mucous membranes  Neck Distended neck veins  Pulm/lungs  clear to auscultation  CVS/Heart tachycardic  Abdomen:  Soft, nontender  Extremities: + edema bilaterally  Neurologic:  Alert and able to answer questions  Skin: Dry skin  Access: Forearm AV fistula       Basic Metabolic Panel:  Recent Labs  Lab 02/12/18 1317 02/13/18 0612  NA 130* 134*  K 6.2* 4.8  CL 98* 101  CO2 25 27  GLUCOSE 98 83  BUN 58* 46*  CREATININE 4.22* 3.37*  CALCIUM 8.1* 7.8*     CBC: Recent Labs  Lab 02/12/18 1317 02/13/18 0612  WBC 4.9 4.0  NEUTROABS 2.3  --   HGB 10.1* 9.6*  HCT 31.2* 29.2*  MCV 85.5 85.5  PLT 329 263      Lab Results  Component Value Date   HEPBSAG Negative  08/15/2017   HEPBSAB Non Reactive 08/15/2017   HEPBIGM Negative 08/15/2017      Microbiology:  Recent Results (from the past 240 hour(s))  MRSA PCR Screening     Status: None   Collection Time: 02/12/18 11:09 PM  Result Value Ref Range Status   MRSA by PCR NEGATIVE NEGATIVE Final    Comment:        The GeneXpert MRSA Assay (FDA approved for NASAL specimens only), is one component of a comprehensive MRSA colonization surveillance program. It is not intended to diagnose MRSA infection nor to guide or monitor treatment for MRSA infections. Performed at [facility], [address] [place], [place] [address]     Coagulation Studies: No results for input(s): LABPROT, INR in the last 72 hours.  Urinalysis: No results for input(s): COLORURINE, LABSPEC, PHURINE, GLUCOSEU, HGBUR, BILIRUBINUR, KETONESUR, PROTEINUR, UROBILINOGEN, NITRITE, LEUKOCYTESUR in the last 72 hours.  Invalid input(s): APPERANCEUR    Imaging: Dg Chest 2 View  Result Date: 02/12/2018 CLINICAL DATA:  Shortness of breath EXAM: CHEST - 2 VIEW COMPARISON:  01/10/2018 FINDINGS: Interval removal of right side dialysis catheter. Moderate bilateral pleural effusions. Cardiomegaly with vascular congestion. Interstitial prominence and bilateral lower lobe airspace opacities, likely a combination of edema and atelectasis. IMPRESSION: Moderate bilateral  pleural effusions. Cardiomegaly with vascular congestion and suspected mild interstitial edema superimposed on bibasilar atelectasis. Electronically Signed   By: Charlett Nose M.D.   On: 02/12/2018 13:43     Medications:   . sodium chloride    . albumin human     . aspirin EC  81 mg Oral Daily  . bisoprolol  5 mg Oral QHS  . brimonidine  1 drop Both Eyes Daily  . brinzolamide  1 drop Both Eyes QHS  . diclofenac sodium  2 g Topical QID  . dorzolamide  1 drop Both Eyes BID  . [START ON 02/14/2018] epoetin (EPOGEN/PROCRIT) injection  4,000 Units Intravenous  Q T,Th,Sa-HD  . heparin  5,000 Units Subcutaneous Q8H  . latanoprost  1 drop Both Eyes QHS  . levothyroxine  25 mcg Oral QAC breakfast  . midodrine  5 mg Oral Once per day on Mon Wed Fri  . mirtazapine  7.5 mg Oral QHS  . multivitamin  1 tablet Oral QHS  . sodium chloride  1 application Left Eye TID  . sodium chloride flush  3 mL Intravenous Q12H   sodium chloride, acetaminophen **OR** acetaminophen, HYDROcodone-acetaminophen, ondansetron **OR** ondansetron (ZOFRAN) IV, sodium chloride flush  Assessment/ Plan:  77 y.o. female with end-stage renal disease, cardiomyopathy, congestive heart failure, glaucoma, hypothyroidism presents for admission after having missed 3 dialysis treatments due to diarrhea  Tuesday/Thursday/Saturday/225 min./58.5 kg/ [name] dialysis/ Forearm AVF  1.  End-stage renal disease 2.  Hyperkalemia 3.  Diarrhea 4.  Anemia of chronic kidney disease 5.  Secondary hyperparathyroidism 6.  Lower extremity edema   Urgent hemodialysis has corrected serum potassium 17-gauge needles due to new fistula  no further loose stools hemodialysis tomorrow if patient still in hospital     LOS: 1 Maahir Horst 5/22/20191:25 PM  [facility] [place], [place] [phone]  Note: This note was prepared with Dragon dictation. Any transcription errors are unintentional

## 2018-02-13 NOTE — Progress Notes (Addendum)
Ann Cunningham D/C'd to [facility]  per MD order.  Report given to [facility] LPN. Discussed prescriptions and follow up appointments with the patient. Medication list explained in detail. Pt verbalized understanding.   Allergies as of 02/13/2018      Reactions   Penicillins Other (See Comments)   Has patient had a PCN reaction causing immediate rash, facial/tongue/throat swelling, SOB or lightheadedness with hypotension: Unknown Has patient had a PCN reaction causing severe rash involving mucus membranes or skin necrosis: Unknown Has patient had a PCN reaction that required hospitalization: Unknown Has patient had a PCN reaction occurring within the last 10 years: Unknown If all of the above answers are "NO", then may proceed with Cephalosporin use.      Medication List    STOP taking these medications   bisoprolol 5 MG tablet Commonly known as:  ZEBETA   brinzolamide 1 % ophthalmic suspension Commonly known as:  AZOPT     TAKE these medications   aspirin 81 MG tablet Take 81 mg by mouth daily.   bisacodyl 5 MG EC tablet Commonly known as:  bisacodyl Take 2 tablets (10 mg total) by mouth daily as needed for moderate constipation. What changed:    when to take this  reasons to take this  Another medication with the same name was removed. Continue taking this medication, and follow the directions you see here.   brimonidine 0.2 % ophthalmic solution Commonly known as:  ALPHAGAN Place 1 drop into both eyes daily as needed (glaucoma). What changed:  when to take this   Brinzolamide-Brimonidine 1-0.2 % Susp Apply 1 drop to eye 3 (three) times daily.   diclofenac sodium 1 % Gel Commonly known as:  VOLTAREN Apply 2 g topically 4 (four) times daily. Apply to right shoulder   dorzolamide 2 % ophthalmic solution Commonly known as:  TRUSOPT Place 1 drop into both eyes 2 (two) times daily.   ethyl chloride spray Apply 1 application topically 3 (three) times a week.  Tuesday, Thursday and Saturday   HYDROcodone-acetaminophen 5-325 MG tablet Commonly known as:  NORCO Take 1-2 tablets by mouth every 6 (six) hours as needed.   latanoprost 0.005 % ophthalmic solution Commonly known as:  XALATAN Place 1 drop into both eyes at bedtime.   levothyroxine 25 MCG tablet Commonly known as:  SYNTHROID, LEVOTHROID Take 25 mcg by mouth daily before breakfast.   midodrine 5 MG tablet Commonly known as:  PROAMATINE Take 5 mg by mouth 3 (three) times a week. Give prior to dialysis on Tuesdays, Thursdays and Saturdays.   mirtazapine 7.5 MG tablet Commonly known as:  REMERON Take 1 tablet by mouth at bedtime.   multivitamin Tabs tablet Take 1 tablet by mouth at bedtime.   MURO 128 5 % ophthalmic ointment Generic drug:  sodium chloride Place 1 application into the left eye 3 (three) times daily.   polyethylene glycol packet Commonly known as:  MIRALAX / GLYCOLAX Take 17 g by mouth daily.   spironolactone 25 MG tablet Commonly known as:  ALDACTONE Take 1 tablet by mouth every Monday, Wednesday, and Friday.       Vitals:   02/13/18 1510 02/13/18 1608  BP: (!) 101/59 111/60  Pulse: 70 90  Resp: 17 18  Temp: 97.9 F (36.6 C) 98.5 F (36.9 C)  SpO2: 100% 97%    Tele box removed and returned. Skin clean, dry and intact without evidence of skin break down, no evidence of skin tears noted.  IV catheter discontinued intact. Site without signs and symptoms of complications. Dressing and pressure applied. Pt denies pain at this time. No complaints noted.  An After Visit Summary was printed and given to the patient. Patient escorted via stretcher and D/C to SNF via EMS.  Rigoberto Noel

## 2018-02-13 NOTE — Discharge Summary (Signed)
[facility] [place] [facility]   PATIENT NAME: Ann Cunningham    MR#:  [id number]  DATE OF BIRTH:  12/17/1940  DATE OF ADMISSION:  02/12/2018 ADMITTING PHYSICIAN: Auburn Bilberry, MD  DATE OF DISCHARGE: 02/13/2018  PRIMARY CARE PHYSICIAN: [facility]    ADMISSION DIAGNOSIS:  Hyperkalemia [E87.5] Non-compliance with renal dialysis (HCC) [Z91.15] Chronic bilateral pleural effusions [J90]  DISCHARGE DIAGNOSIS:  Hyperkalemia due to missing HD due to Diarrhea from treatment of constipation. ESRD on HD  SECONDARY DIAGNOSIS:   Past Medical History:  Diagnosis Date  . Anemia   . Cardiomyopathy (HCC)   . CHF (congestive heart failure) (HCC)   . CKD (chronic kidney disease)   . Glaucoma   . Hypertension   . Hypothyroidism   . Pressure ulcer of sacrum   . Renal dialysis device, implant, or graft complication   . Renal disorder   . Uses walker     HOSPITAL COURSE:  Ann Cunningham  is a 77 y.o. female with a known history of end-stage renal disease, cardiomyopathy, congestive heart failure, glaucoma, hypertension, hypothyroidism who is presenting from a skilled nursing facility.  Patient had missed 3 of her dialysis days due to persistent diarrhea.  Patient states that she had severe diarrhea for the past 1 week and was unable to go to her dialysis  1.  Hyperkalemia due to missing dialysis Patient will be dialyzed per nephrology K now corrected diarrhea resolved. It was due to the treatment for constipation  2.  Bilateral pleural effusions with fluid overload due to missing dialysis fluid removal per nephrology  3.  Hypothyroidism continue Synthroid  4.  Recent diarrhea discontinue all stool softeners  5.  Glaucoma continue eyedrops  6.  Hypotension with dialysis continue midodrine with hemodialysis  7.  Miscellaneous heparin for DVT prophylaxis  Overall feels at baseline. Pt will return back to [facility] today. She is a long  term resident there. Pt and family is agreeable.  CONSULTS OBTAINED:  Treatment Team:  Mosetta Pigeon, MD  DRUG ALLERGIES:   Allergies  Allergen Reactions  . Penicillins Other (See Comments)    Has patient had a PCN reaction causing immediate rash, facial/tongue/throat swelling, SOB or lightheadedness with hypotension: Unknown Has patient had a PCN reaction causing severe rash involving mucus membranes or skin necrosis: Unknown Has patient had a PCN reaction that required hospitalization: Unknown Has patient had a PCN reaction occurring within the last 10 years: Unknown If all of the above answers are "NO", then may proceed with Cephalosporin use.     DISCHARGE MEDICATIONS:   Allergies as of 02/13/2018      Reactions   Penicillins Other (See Comments)   Has patient had a PCN reaction causing immediate rash, facial/tongue/throat swelling, SOB or lightheadedness with hypotension: Unknown Has patient had a PCN reaction causing severe rash involving mucus membranes or skin necrosis: Unknown Has patient had a PCN reaction that required hospitalization: Unknown Has patient had a PCN reaction occurring within the last 10 years: Unknown If all of the above answers are "NO", then may proceed with Cephalosporin use.      Medication List    STOP taking these medications   bisoprolol 5 MG tablet Commonly known as:  ZEBETA   brinzolamide 1 % ophthalmic suspension Commonly known as:  AZOPT     TAKE these medications   aspirin 81 MG tablet Take 81 mg by mouth daily.   bisacodyl 5 MG EC tablet Commonly  known as:  bisacodyl Take 2 tablets (10 mg total) by mouth daily as needed for moderate constipation. What changed:    when to take this  reasons to take this  Another medication with the same name was removed. Continue taking this medication, and follow the directions you see here.   brimonidine 0.2 % ophthalmic solution Commonly known as:  ALPHAGAN Place 1 drop into both  eyes daily as needed (glaucoma). What changed:  when to take this   Brinzolamide-Brimonidine 1-0.2 % Susp Apply 1 drop to eye 3 (three) times daily.   diclofenac sodium 1 % Gel Commonly known as:  VOLTAREN Apply 2 g topically 4 (four) times daily. Apply to right shoulder   dorzolamide 2 % ophthalmic solution Commonly known as:  TRUSOPT Place 1 drop into both eyes 2 (two) times daily.   ethyl chloride spray Apply 1 application topically 3 (three) times a week. Tuesday, Thursday and Saturday   HYDROcodone-acetaminophen 5-325 MG tablet Commonly known as:  NORCO Take 1-2 tablets by mouth every 6 (six) hours as needed.   latanoprost 0.005 % ophthalmic solution Commonly known as:  XALATAN Place 1 drop into both eyes at bedtime.   levothyroxine 25 MCG tablet Commonly known as:  SYNTHROID, LEVOTHROID Take 25 mcg by mouth daily before breakfast.   midodrine 5 MG tablet Commonly known as:  PROAMATINE Take 5 mg by mouth 3 (three) times a week. Give prior to dialysis on Tuesdays, Thursdays and Saturdays.   mirtazapine 7.5 MG tablet Commonly known as:  REMERON Take 1 tablet by mouth at bedtime.   multivitamin Tabs tablet Take 1 tablet by mouth at bedtime.   MURO 128 5 % ophthalmic ointment Generic drug:  sodium chloride Place 1 application into the left eye 3 (three) times daily.   polyethylene glycol packet Commonly known as:  MIRALAX / GLYCOLAX Take 17 g by mouth daily.   spironolactone 25 MG tablet Commonly known as:  ALDACTONE Take 1 tablet by mouth every Monday, Wednesday, and Friday.       If you experience worsening of your admission symptoms, develop shortness of breath, life threatening emergency, suicidal or homicidal thoughts you must seek medical attention immediately by calling 911 or calling your MD immediately  if symptoms less severe.  You Must read complete instructions/literature along with all the possible adverse reactions/side effects for all the  Medicines you take and that have been prescribed to you. Take any new Medicines after you have completely understood and accept all the possible adverse reactions/side effects.   Please note  You were cared for by a hospitalist during your hospital stay. If you have any questions about your discharge medications or the care you received while you were in the hospital after you are discharged, you can call the unit and asked to speak with the hospitalist on call if the hospitalist that took care of you is not available. Once you are discharged, your primary care physician will handle any further medical issues. Please note that NO REFILLS for any discharge medications will be authorized once you are discharged, as it is imperative that you return to your primary care physician (or establish a relationship with a primary care physician if you do not have one) for your aftercare needs so that they can reassess your need for medications and monitor your lab values. Today   SUBJECTIVE   Doing well. No diarrhea per pt. Eating BF  VITAL SIGNS:  Blood pressure 100/61, pulse 72, temperature 98.1  F (36.7 C), temperature source Oral, resp. rate 17, height 5' (1.524 m), weight 57.4 kg (126 lb 9.6 oz), SpO2 100 %.  I/O:    Intake/Output Summary (Last 24 hours) at 02/13/2018 0949 Last data filed at 02/13/2018 0700 Gross per 24 hour  Intake 477 ml  Output 507 ml  Net -30 ml    PHYSICAL EXAMINATION:  GENERAL:  77 y.o.-year-old patient lying in the bed with no acute distress. Appears chronically ill, thin weak and fraileEYES: Pupils equal, round, reactive to light and accommodation. No scleral icterus. Extraocular muscles intact.  HEENT: Head atraumatic, normocephalic. Oropharynx and nasopharynx clear.  NECK:  Supple, no jugular venous distention. No thyroid enlargement, no tenderness.  LUNGS: Normal breath sounds bilaterally, no wheezing, rales,rhonchi or crepitation. No use of accessory muscles of  respiration.  CARDIOVASCULAR: S1, S2 normal. No murmurs, rubs, or gallops.  ABDOMEN: Soft, non-tender, non-distended. Bowel sounds present. No organomegaly or mass.  EXTREMITIES: No pedal edema, cyanosis, or clubbing.  NEUROLOGIC: Cranial nerves II through XII are intact. Muscle strength 4/5 in all extremities. Sensation intact. Gait not checked.  PSYCHIATRIC:  patient is alert and oriented x 3.  SKIN: No obvious rash, lesion, or ulcer.   DATA REVIEW:   CBC  Recent Labs  Lab 02/13/18 0612  WBC 4.0  HGB 9.6*  HCT 29.2*  PLT 263    Chemistries  Recent Labs  Lab 02/13/18 0612  NA 134*  K 4.8  CL 101  CO2 27  GLUCOSE 83  BUN 46*  CREATININE 3.37*  CALCIUM 7.8*    Microbiology Results   Recent Results (from the past 240 hour(s))  MRSA PCR Screening     Status: None   Collection Time: 02/12/18 11:09 PM  Result Value Ref Range Status   MRSA by PCR NEGATIVE NEGATIVE Final    Comment:        The GeneXpert MRSA Assay (FDA approved for NASAL specimens only), is one component of a comprehensive MRSA colonization surveillance program. It is not intended to diagnose MRSA infection nor to guide or monitor treatment for MRSA infections. Performed at [facility], [address] [place], [place] [address]     RADIOLOGY:  Dg Chest 2 View  Result Date: 02/12/2018 CLINICAL DATA:  Shortness of breath EXAM: CHEST - 2 VIEW COMPARISON:  01/10/2018 FINDINGS: Interval removal of right side dialysis catheter. Moderate bilateral pleural effusions. Cardiomegaly with vascular congestion. Interstitial prominence and bilateral lower lobe airspace opacities, likely a combination of edema and atelectasis. IMPRESSION: Moderate bilateral pleural effusions. Cardiomegaly with vascular congestion and suspected mild interstitial edema superimposed on bibasilar atelectasis. Electronically Signed   By: Charlett Nose M.D.   On: 02/12/2018 13:43     Management plans discussed with the  patient, family and they are in agreement.  CODE STATUS:     Code Status Orders  (From admission, onward)        Start     Ordered   02/12/18 1529  Full code  Continuous     02/12/18 1528    Code Status History    Date Active Date Inactive Code Status Order ID Comments User Context   01/10/2018 1536 01/13/2018 0045 Full Code [id number]  Alford Highland, MD ED   10/19/2017 1310 10/20/2017 2106 Full Code [id number]  Adrian Saran, MD Inpatient   08/17/2017 2122 08/22/2017 2014 DNR [id number]  Alford Highland, MD ED   08/14/2017 0459 08/15/2017 1941 Full Code [id number]  Arnaldo Natal, MD Inpatient  TOTAL TIME TAKING CARE OF THIS PATIENT: 40 minutes.    Enedina Finner M.D on 02/13/2018 at 9:49 AM  Between 7am to 6pm - Pager - [phone] After 6pm go to [email or web address] - Social research officer, government  [facility]  Office  [phone]  CC: Primary care physician; [facility]

## 2018-02-13 NOTE — Care Management (Signed)
Amanda Morris HD liaison notified of admission and discharge 

## 2018-02-13 NOTE — Clinical Social Work Note (Signed)
Clinical Social Work Assessment  Patient Details  Name: Ann Cunningham MRN: [id number] Date of Birth: 1941/06/02  Date of referral:  02/13/18               Reason for consult:  Facility Placement                Permission sought to share information with:  Family Supports, Magazine features editor Permission granted to share information::  Yes, Verbal Permission Granted  Name::     [place],Linda Niece   [phone]   Agency::  SNF admissions  Relationship::     Contact Information:     Housing/Transportation Living arrangements for the past 2 months:  Skilled Nursing Facility Source of Information:  Patient, Medical Team Patient Interpreter Needed:  None Criminal Activity/Legal Involvement Pertinent to Current Situation/Hospitalization:  No - Comment as needed Significant Relationships:  Other Family Members Lives with:  Facility Resident Do you feel safe going back to the place where you live?  Yes Need for family participation in patient care:  No (Coment)  Care giving concerns:  Patient did not express any concerns about returning back to SNF.   Social Worker assessment / plan:  Patient is a 77 year old female who is a long term care resident at [facility].  CSW spoke to patient to discuss how things are going and if she had any concerns.  Patient expressed she did not have any concerns about returning back to [facility].  CSW explained role of CSW and process for coordinating discharge back to SNF.  CSW spoke to patient's niece Bonita Quin [phone] and she did not express any concerns either.   Patient did not have any issues and gave CSW permission to send updated clinicals to SNF.  Employment status:  Retired Database administrator PT Recommendations:  Not assessed at this time Information / Referral to community resources:  Skilled Nursing Facility  Patient/Family's Response to care:  Patient and family is agreeable to  returning back to SNF.  Patient/Family's Understanding of and Emotional Response to Diagnosis, Current Treatment, and Prognosis:  Patient and family are going back to SNF, and are relieved that she does not have to be in the hospital very long.  Emotional Assessment Appearance:  Appears stated age Attitude/Demeanor/Rapport:    Affect (typically observed):  Appropriate, Calm Orientation:  Oriented to Self, Oriented to Place Alcohol / Substance use:  Not Applicable Psych involvement (Current and /or in the community):  No (Comment)  Discharge Needs  Concerns to be addressed:  Care Coordination Readmission within the last 30 days:  No Current discharge risk:  Cognitively Impaired Barriers to Discharge:  No Barriers Identified   Darleene Cleaver, LCSWA 02/13/2018, 4:17 PM

## 2018-04-09 ENCOUNTER — Other Ambulatory Visit: Payer: Self-pay

## 2018-04-09 ENCOUNTER — Emergency Department: Payer: Medicare Other

## 2018-04-09 ENCOUNTER — Inpatient Hospital Stay
Admission: EM | Admit: 2018-04-09 | Discharge: 2018-04-25 | DRG: 207 | Disposition: E | Payer: Medicare Other | Attending: Family Medicine | Admitting: Family Medicine

## 2018-04-09 DIAGNOSIS — Z452 Encounter for adjustment and management of vascular access device: Secondary | ICD-10-CM

## 2018-04-09 DIAGNOSIS — N186 End stage renal disease: Secondary | ICD-10-CM

## 2018-04-09 DIAGNOSIS — Z9842 Cataract extraction status, left eye: Secondary | ICD-10-CM

## 2018-04-09 DIAGNOSIS — I4891 Unspecified atrial fibrillation: Secondary | ICD-10-CM | POA: Diagnosis not present

## 2018-04-09 DIAGNOSIS — R64 Cachexia: Secondary | ICD-10-CM | POA: Diagnosis present

## 2018-04-09 DIAGNOSIS — Z880 Allergy status to penicillin: Secondary | ICD-10-CM

## 2018-04-09 DIAGNOSIS — I420 Dilated cardiomyopathy: Secondary | ICD-10-CM | POA: Diagnosis present

## 2018-04-09 DIAGNOSIS — N2581 Secondary hyperparathyroidism of renal origin: Secondary | ICD-10-CM | POA: Diagnosis present

## 2018-04-09 DIAGNOSIS — Z9115 Patient's noncompliance with renal dialysis: Secondary | ICD-10-CM

## 2018-04-09 DIAGNOSIS — Z6821 Body mass index (BMI) 21.0-21.9, adult: Secondary | ICD-10-CM

## 2018-04-09 DIAGNOSIS — I132 Hypertensive heart and chronic kidney disease with heart failure and with stage 5 chronic kidney disease, or end stage renal disease: Secondary | ICD-10-CM | POA: Diagnosis present

## 2018-04-09 DIAGNOSIS — Z515 Encounter for palliative care: Secondary | ICD-10-CM

## 2018-04-09 DIAGNOSIS — I471 Supraventricular tachycardia: Secondary | ICD-10-CM | POA: Diagnosis not present

## 2018-04-09 DIAGNOSIS — Z66 Do not resuscitate: Secondary | ICD-10-CM | POA: Diagnosis present

## 2018-04-09 DIAGNOSIS — H409 Unspecified glaucoma: Secondary | ICD-10-CM | POA: Diagnosis present

## 2018-04-09 DIAGNOSIS — Z7189 Other specified counseling: Secondary | ICD-10-CM

## 2018-04-09 DIAGNOSIS — R627 Adult failure to thrive: Secondary | ICD-10-CM | POA: Diagnosis present

## 2018-04-09 DIAGNOSIS — R0602 Shortness of breath: Secondary | ICD-10-CM | POA: Diagnosis not present

## 2018-04-09 DIAGNOSIS — Z8249 Family history of ischemic heart disease and other diseases of the circulatory system: Secondary | ICD-10-CM

## 2018-04-09 DIAGNOSIS — J9622 Acute and chronic respiratory failure with hypercapnia: Secondary | ICD-10-CM | POA: Diagnosis present

## 2018-04-09 DIAGNOSIS — I9589 Other hypotension: Secondary | ICD-10-CM | POA: Diagnosis present

## 2018-04-09 DIAGNOSIS — Z79890 Hormone replacement therapy (postmenopausal): Secondary | ICD-10-CM

## 2018-04-09 DIAGNOSIS — Z9889 Other specified postprocedural states: Secondary | ICD-10-CM

## 2018-04-09 DIAGNOSIS — Z978 Presence of other specified devices: Secondary | ICD-10-CM

## 2018-04-09 DIAGNOSIS — I5043 Acute on chronic combined systolic (congestive) and diastolic (congestive) heart failure: Secondary | ICD-10-CM | POA: Diagnosis present

## 2018-04-09 DIAGNOSIS — F419 Anxiety disorder, unspecified: Secondary | ICD-10-CM | POA: Diagnosis present

## 2018-04-09 DIAGNOSIS — Z9689 Presence of other specified functional implants: Secondary | ICD-10-CM

## 2018-04-09 DIAGNOSIS — Z87891 Personal history of nicotine dependence: Secondary | ICD-10-CM

## 2018-04-09 DIAGNOSIS — R1011 Right upper quadrant pain: Secondary | ICD-10-CM

## 2018-04-09 DIAGNOSIS — Z992 Dependence on renal dialysis: Secondary | ICD-10-CM

## 2018-04-09 DIAGNOSIS — J9690 Respiratory failure, unspecified, unspecified whether with hypoxia or hypercapnia: Secondary | ICD-10-CM

## 2018-04-09 DIAGNOSIS — G9340 Encephalopathy, unspecified: Secondary | ICD-10-CM | POA: Diagnosis present

## 2018-04-09 DIAGNOSIS — J90 Pleural effusion, not elsewhere classified: Secondary | ICD-10-CM | POA: Diagnosis not present

## 2018-04-09 DIAGNOSIS — Z993 Dependence on wheelchair: Secondary | ICD-10-CM

## 2018-04-09 DIAGNOSIS — Z7982 Long term (current) use of aspirin: Secondary | ICD-10-CM

## 2018-04-09 DIAGNOSIS — Z4659 Encounter for fitting and adjustment of other gastrointestinal appliance and device: Secondary | ICD-10-CM

## 2018-04-09 DIAGNOSIS — J9600 Acute respiratory failure, unspecified whether with hypoxia or hypercapnia: Secondary | ICD-10-CM

## 2018-04-09 DIAGNOSIS — Z9841 Cataract extraction status, right eye: Secondary | ICD-10-CM

## 2018-04-09 DIAGNOSIS — Z79899 Other long term (current) drug therapy: Secondary | ICD-10-CM

## 2018-04-09 DIAGNOSIS — J939 Pneumothorax, unspecified: Secondary | ICD-10-CM

## 2018-04-09 DIAGNOSIS — E43 Unspecified severe protein-calorie malnutrition: Secondary | ICD-10-CM | POA: Diagnosis present

## 2018-04-09 DIAGNOSIS — D631 Anemia in chronic kidney disease: Secondary | ICD-10-CM | POA: Diagnosis present

## 2018-04-09 DIAGNOSIS — E039 Hypothyroidism, unspecified: Secondary | ICD-10-CM | POA: Diagnosis present

## 2018-04-09 DIAGNOSIS — L8990 Pressure ulcer of unspecified site, unspecified stage: Secondary | ICD-10-CM

## 2018-04-09 DIAGNOSIS — Z961 Presence of intraocular lens: Secondary | ICD-10-CM | POA: Diagnosis present

## 2018-04-09 DIAGNOSIS — K8020 Calculus of gallbladder without cholecystitis without obstruction: Secondary | ICD-10-CM

## 2018-04-09 DIAGNOSIS — J9621 Acute and chronic respiratory failure with hypoxia: Secondary | ICD-10-CM | POA: Diagnosis present

## 2018-04-09 LAB — CBC WITH DIFFERENTIAL/PLATELET
Basophils Absolute: 0.1 10*3/uL (ref 0–0.1)
Basophils Relative: 2 %
EOS PCT: 1 %
Eosinophils Absolute: 0.1 10*3/uL (ref 0–0.7)
HCT: 31.2 % — ABNORMAL LOW (ref 35.0–47.0)
Hemoglobin: 10.1 g/dL — ABNORMAL LOW (ref 12.0–16.0)
LYMPHS ABS: 1.3 10*3/uL (ref 1.0–3.6)
LYMPHS PCT: 19 %
MCH: 28 pg (ref 26.0–34.0)
MCHC: 32.4 g/dL (ref 32.0–36.0)
MCV: 86.5 fL (ref 80.0–100.0)
MONO ABS: 0.8 10*3/uL (ref 0.2–0.9)
Monocytes Relative: 11 %
Neutro Abs: 4.8 10*3/uL (ref 1.4–6.5)
Neutrophils Relative %: 67 %
PLATELETS: 389 10*3/uL (ref 150–440)
RBC: 3.61 MIL/uL — AB (ref 3.80–5.20)
RDW: 17.6 % — ABNORMAL HIGH (ref 11.5–14.5)
WBC: 7.1 10*3/uL (ref 3.6–11.0)

## 2018-04-09 NOTE — ED Provider Notes (Signed)
[facility] Emergency Department Provider Note  ____________________________________________   I have reviewed the triage vital signs and the nursing notes.   HISTORY  Chief Complaint Shortness of Breath   History limited by: Not Limited   HPI Ann Cunningham is a 77 y.o. female who presents to the emergency department today brought in by EMS today from [facility] because of concerns for shortness of breath.  The shortness of breath started today.  Patient did miss her dialysis appointment earlier today.  She stated she was not able to go because of abdominal discomfort.  She thinks she might of been constipated because she was given laxatives had a bowel movement now no longer has abdominal pain.  The patient however did then develop shortness of breath.  She denies any chest pain.  She denies any fevers.   Per medical record review patient has a history of CHF, CKD on dialysis.   Past Medical History:  Diagnosis Date  . Anemia   . Cardiomyopathy (HCC)   . CHF (congestive heart failure) (HCC)   . CKD (chronic kidney disease)   . Glaucoma   . Hypertension   . Hypothyroidism   . Pressure ulcer of sacrum   . Renal dialysis device, implant, or graft complication   . Renal disorder   . Uses walker     Patient Active Problem List   Diagnosis Date Noted  . Hyperkalemia 02/12/2018  . Hypotension 01/10/2018  . Protein-calorie malnutrition, severe 10/20/2017  . Encephalopathy 10/19/2017  . ESRD on dialysis (HCC) 09/12/2017  . Essential hypertension 09/12/2017  . Failure to thrive in adult 08/17/2017  . Dyspnea 08/14/2017    Past Surgical History:  Procedure Laterality Date  . BASCILIC VEIN TRANSPOSITION Right 10/19/2017   Procedure: Radiocephalic fistula creation;  Surgeon: Renford DillsSchnier, Gregory G, MD;  Location: [facility] ORS;  Service: Vascular;  Laterality: Right;  . CAPD INSERTION N/A 08/15/2017   Procedure: LAPAROSCOPIC INSERTION CONTINUOUS  AMBULATORY PERITONEAL DIALYSIS  (CAPD) CATHETER;  Surgeon: Renford DillsSchnier, Gregory G, MD;  Location: [facility] ORS;  Service: Vascular;  Laterality: N/A;  . CATARACT EXTRACTION W/ INTRAOCULAR LENS IMPLANT    . CATARACT EXTRACTION W/PHACO Right 12/25/2016   Procedure: CATARACT EXTRACTION PHACO AND INTRAOCULAR LENS PLACEMENT (IOC)  Right complicated;  Surgeon: Sherald HessAnita Prakash Vin-Parikh, MD;  [facility];  Service: Ophthalmology;  Laterality: Right;  Vision Blue  . DIALYSIS/PERMA CATHETER INSERTION N/A 08/20/2017   Procedure: DIALYSIS/PERMA CATHETER INSERTION;  Surgeon: Annice Needyew, Jason S, MD;  Location: [facility] INVASIVE CV LAB;  Service: Cardiovascular;  Laterality: N/A;  . DIALYSIS/PERMA CATHETER REMOVAL N/A 02/04/2018   Procedure: DIALYSIS/PERMA CATHETER REMOVAL;  Surgeon: Annice Needyew, Jason S, MD;  Location: [facility] INVASIVE CV LAB;  Service: Cardiovascular;  Laterality: N/A;  . REMOVAL OF A DIALYSIS CATHETER N/A 10/19/2017   Procedure: REMOVAL OF A DIALYSIS CATHETER;  Surgeon: Renford DillsSchnier, Gregory G, MD;  Location: [facility] ORS;  Service: Vascular;  Laterality: N/A;    Prior to Admission medications   Medication Sig Start Date End Date Taking? Authorizing Provider  aspirin 81 MG tablet Take 81 mg by mouth daily.    [provider]  bisacodyl (BISACODYL) 5 MG EC tablet Take 2 tablets (10 mg total) by mouth daily as needed for moderate constipation. 02/13/18   Enedina FinnerPatel, Sona, MD  brimonidine (ALPHAGAN) 0.2 % ophthalmic solution Place 1 drop into both eyes daily as needed (glaucoma). Patient taking differently: Place 1 drop into both eyes every 8 (eight) hours as needed (glaucoma).  08/22/17   Alford Highland, MD  Brinzolamide-Brimonidine 1-0.2 % SUSP Apply 1 drop to eye 3 (three) times daily.    [provider]  diclofenac sodium (VOLTAREN) 1 % GEL Apply 2 g topically 4 (four) times daily. Apply to right shoulder    [provider]  dorzolamide (TRUSOPT) 2 % ophthalmic solution Place 1 drop into  both eyes 2 (two) times daily.    [provider]  ethyl chloride spray Apply 1 application topically 3 (three) times a week. Tuesday, Thursday and Saturday    [provider]  HYDROcodone-acetaminophen (NORCO) 5-325 MG tablet Take 1-2 tablets by mouth every 6 (six) hours as needed. 01/11/18   Houston Siren, MD  latanoprost (XALATAN) 0.005 % ophthalmic solution Place 1 drop into both eyes at bedtime. 12/17/17   [provider]  levothyroxine (SYNTHROID, LEVOTHROID) 25 MCG tablet Take 25 mcg by mouth daily before breakfast.    [provider]  midodrine (PROAMATINE) 5 MG tablet Take 5 mg by mouth 3 (three) times a week. Give prior to dialysis on Tuesdays, Thursdays and Saturdays.    [provider]  mirtazapine (REMERON) 7.5 MG tablet Take 1 tablet by mouth at bedtime. 12/11/17   [provider]  multivitamin (RENA-VIT) TABS tablet Take 1 tablet by mouth at bedtime. 08/22/17   Alford Highland, MD  polyethylene glycol (MIRALAX / GLYCOLAX) packet Take 17 g by mouth daily.    [provider]  sodium chloride (MURO 128) 5 % ophthalmic ointment Place 1 application into the left eye 3 (three) times daily. 10/01/15   [provider]  spironolactone (ALDACTONE) 25 MG tablet Take 1 tablet by mouth every Monday, Wednesday, and Friday. 11/30/17   [provider]    Allergies Penicillins  Family History  Problem Relation Age of Onset  . Hypertension Mother   . CVA Mother   . CAD Father     Social History Social History   Tobacco Use  . Smoking status: Former Smoker    Packs/day: 0.50    Types: Cigarettes    Last attempt to quit: 06/14/2017    Years since quitting: 0.8  . Smokeless tobacco: Former Neurosurgeon    Types: Snuff  Substance Use Topics  . Alcohol use: No  . Drug use: No    Review of Systems Constitutional: No fever/chills Eyes: No visual changes. ENT: No sore throat. Cardiovascular: Denies chest  pain. Respiratory: Positive for shortness of breath. Gastrointestinal: Positive for abdominal pain that has resolved.  Genitourinary: Negative for dysuria. Musculoskeletal: Negative for back pain. Skin: Negative for rash. Neurological: Negative for headaches, focal weakness or numbness.  ____________________________________________   PHYSICAL EXAM:  VITAL SIGNS: ED Triage Vitals  Enc Vitals Group     BP --      Pulse Rate 04/17/2018 2112 (!) 112     Resp 04/22/2018 2112 20     Temp 04/18/2018 2112 97.9 F (36.6 C)     Temp Source 03/28/2018 2112 Oral     SpO2 03/29/2018 2112 98 %     Weight 04/11/2018 2114 125 lb (56.7 kg)     Height 03/25/2018 2114 5' (1.524 m)   Constitutional: Alert and oriented.  Eyes: Conjunctivae are normal.  ENT      Head: Normocephalic and atraumatic.      Nose: No congestion/rhinnorhea.      Mouth/Throat: Mucous membranes are moist.      Neck: No stridor. Hematological/Lymphatic/Immunilogical: No cervical lymphadenopathy. Cardiovascular: Tachycardic, regular rhythm.  No murmurs, rubs, or gallops. Respiratory: Slightly increased respiratory effort. Some decrease in breath sounds to bilateral bases.  Gastrointestinal: Soft and non tender. No rebound. No guarding.  Genitourinary: Deferred Musculoskeletal: Normal range of motion in all extremities. No lower extremity edema. Neurologic:  Normal speech and language. No gross focal neurologic deficits are appreciated.  Skin:  Skin is warm, dry and intact. No rash noted. Psychiatric: Mood and affect are normal. Speech and behavior are normal. Patient exhibits appropriate insight and judgment.  ____________________________________________    LABS (pertinent positives/negatives)  Trop <0.03 CMP na 141, k 4.8, cr 4.09 CBC wbc 7.1, hgb 10.1, plt 389  ____________________________________________    RADIOLOGY  CXR Pleural effusions, concern for hydropneumothorax  CT abd/pel No acute findings to explain  pain  CT chest Moderate to large bilateral pleural effusions  ____________________________________________   PROCEDURES  Procedures  ____________________________________________   INITIAL IMPRESSION / ASSESSMENT AND PLAN / ED COURSE  Pertinent labs & imaging results that were available during my care of the patient were reviewed by me and considered in my medical decision making (see chart for details).   Patient presented to the emergency department today because of concerns for shortness of breath.  Differential would include pneumonia pneumothorax PE ACS anemia edema.  Patient did miss dialysis today.  Chest x-ray came back and did show pleural effusions as well as concern for possible hydropneumothorax.  CT scan was obtained to better evaluate.  This is not read as having any pneumothorax.  Did show the large pleural effusions.  Given patient's shortness of breath and large pleural effusion will plan on admission.  Discussed findings and plan with patient.  ____________________________________________   FINAL CLINICAL IMPRESSION(S) / ED DIAGNOSES  Final diagnoses:  Shortness of breath  Chronic bilateral pleural effusions     Note: This dictation was prepared with Dragon dictation. Any transcriptional errors that result from this process are unintentional     Phineas Semen, MD 04/10/18 1642

## 2018-04-09 NOTE — ED Triage Notes (Signed)
Pt sent from [facility] for SOB, pt refused dialysis today. +abdominal pain earlier today. Tachycardic for EMS. Pt is DNR. A & O in triage.

## 2018-04-10 ENCOUNTER — Observation Stay: Payer: Medicare Other

## 2018-04-10 ENCOUNTER — Other Ambulatory Visit: Payer: Self-pay

## 2018-04-10 ENCOUNTER — Inpatient Hospital Stay: Payer: Medicare Other

## 2018-04-10 ENCOUNTER — Inpatient Hospital Stay
Admit: 2018-04-10 | Discharge: 2018-04-10 | Disposition: A | Discharge status: Home / Self Care | Payer: Medicare Other | Attending: Internal Medicine | Admitting: Internal Medicine

## 2018-04-10 DIAGNOSIS — J9622 Acute and chronic respiratory failure with hypercapnia: Secondary | ICD-10-CM | POA: Diagnosis present

## 2018-04-10 DIAGNOSIS — J9600 Acute respiratory failure, unspecified whether with hypoxia or hypercapnia: Secondary | ICD-10-CM | POA: Diagnosis present

## 2018-04-10 DIAGNOSIS — J939 Pneumothorax, unspecified: Secondary | ICD-10-CM | POA: Diagnosis present

## 2018-04-10 DIAGNOSIS — D631 Anemia in chronic kidney disease: Secondary | ICD-10-CM | POA: Diagnosis present

## 2018-04-10 DIAGNOSIS — Z79899 Other long term (current) drug therapy: Secondary | ICD-10-CM | POA: Diagnosis not present

## 2018-04-10 DIAGNOSIS — I132 Hypertensive heart and chronic kidney disease with heart failure and with stage 5 chronic kidney disease, or end stage renal disease: Secondary | ICD-10-CM | POA: Diagnosis present

## 2018-04-10 DIAGNOSIS — Z79890 Hormone replacement therapy (postmenopausal): Secondary | ICD-10-CM | POA: Diagnosis not present

## 2018-04-10 DIAGNOSIS — Z7982 Long term (current) use of aspirin: Secondary | ICD-10-CM | POA: Diagnosis not present

## 2018-04-10 DIAGNOSIS — J90 Pleural effusion, not elsewhere classified: Secondary | ICD-10-CM | POA: Diagnosis present

## 2018-04-10 DIAGNOSIS — Z961 Presence of intraocular lens: Secondary | ICD-10-CM | POA: Diagnosis present

## 2018-04-10 DIAGNOSIS — I5043 Acute on chronic combined systolic (congestive) and diastolic (congestive) heart failure: Secondary | ICD-10-CM | POA: Diagnosis present

## 2018-04-10 DIAGNOSIS — J9621 Acute and chronic respiratory failure with hypoxia: Secondary | ICD-10-CM | POA: Diagnosis present

## 2018-04-10 DIAGNOSIS — Z992 Dependence on renal dialysis: Secondary | ICD-10-CM | POA: Diagnosis not present

## 2018-04-10 DIAGNOSIS — Z978 Presence of other specified devices: Secondary | ICD-10-CM | POA: Diagnosis not present

## 2018-04-10 DIAGNOSIS — Z7189 Other specified counseling: Secondary | ICD-10-CM | POA: Diagnosis not present

## 2018-04-10 DIAGNOSIS — G9340 Encephalopathy, unspecified: Secondary | ICD-10-CM | POA: Diagnosis present

## 2018-04-10 DIAGNOSIS — R0602 Shortness of breath: Secondary | ICD-10-CM | POA: Diagnosis present

## 2018-04-10 DIAGNOSIS — E039 Hypothyroidism, unspecified: Secondary | ICD-10-CM | POA: Diagnosis present

## 2018-04-10 DIAGNOSIS — R64 Cachexia: Secondary | ICD-10-CM | POA: Diagnosis present

## 2018-04-10 DIAGNOSIS — J9601 Acute respiratory failure with hypoxia: Secondary | ICD-10-CM | POA: Diagnosis not present

## 2018-04-10 DIAGNOSIS — Z515 Encounter for palliative care: Secondary | ICD-10-CM | POA: Diagnosis present

## 2018-04-10 DIAGNOSIS — N186 End stage renal disease: Secondary | ICD-10-CM | POA: Diagnosis present

## 2018-04-10 DIAGNOSIS — E43 Unspecified severe protein-calorie malnutrition: Secondary | ICD-10-CM | POA: Diagnosis present

## 2018-04-10 DIAGNOSIS — Z66 Do not resuscitate: Secondary | ICD-10-CM | POA: Diagnosis present

## 2018-04-10 DIAGNOSIS — Z9842 Cataract extraction status, left eye: Secondary | ICD-10-CM | POA: Diagnosis not present

## 2018-04-10 DIAGNOSIS — Z9841 Cataract extraction status, right eye: Secondary | ICD-10-CM | POA: Diagnosis not present

## 2018-04-10 DIAGNOSIS — I471 Supraventricular tachycardia: Secondary | ICD-10-CM | POA: Diagnosis not present

## 2018-04-10 DIAGNOSIS — I420 Dilated cardiomyopathy: Secondary | ICD-10-CM | POA: Diagnosis present

## 2018-04-10 DIAGNOSIS — J9602 Acute respiratory failure with hypercapnia: Secondary | ICD-10-CM | POA: Diagnosis not present

## 2018-04-10 DIAGNOSIS — N2581 Secondary hyperparathyroidism of renal origin: Secondary | ICD-10-CM | POA: Diagnosis present

## 2018-04-10 LAB — BASIC METABOLIC PANEL
Anion gap: 11 (ref 5–15)
BUN: 73 mg/dL — ABNORMAL HIGH (ref 8–23)
CO2: 27 mmol/L (ref 22–32)
Calcium: 8.4 mg/dL — ABNORMAL LOW (ref 8.9–10.3)
Chloride: 101 mmol/L (ref 98–111)
Creatinine, Ser: 4.18 mg/dL — ABNORMAL HIGH (ref 0.44–1.00)
GFR calc non Af Amer: 9 mL/min — ABNORMAL LOW (ref >60)
GFR, EST AFRICAN AMERICAN: 11 mL/min — AB (ref >60)
Glucose, Bld: 97 mg/dL (ref 70–99)
POTASSIUM: 4.7 mmol/L (ref 3.5–5.1)
SODIUM: 139 mmol/L (ref 135–145)

## 2018-04-10 LAB — COMPREHENSIVE METABOLIC PANEL
ALBUMIN: 2.9 g/dL — AB (ref 3.5–5.0)
ALBUMIN: 2.9 g/dL — AB (ref 3.5–5.0)
ALT: 21 U/L (ref 0–44)
ALT: 22 U/L (ref 0–44)
ANION GAP: 10 (ref 5–15)
AST: 30 U/L (ref 15–41)
AST: 33 U/L (ref 15–41)
Alkaline Phosphatase: 118 U/L (ref 38–126)
Alkaline Phosphatase: 120 U/L (ref 38–126)
Anion gap: 8 (ref 5–15)
BUN: 71 mg/dL — ABNORMAL HIGH (ref 8–23)
BUN: 74 mg/dL — AB (ref 8–23)
CHLORIDE: 102 mmol/L (ref 98–111)
CHLORIDE: 102 mmol/L (ref 98–111)
CO2: 28 mmol/L (ref 22–32)
CO2: 29 mmol/L (ref 22–32)
Calcium: 8.2 mg/dL — ABNORMAL LOW (ref 8.9–10.3)
Calcium: 8.6 mg/dL — ABNORMAL LOW (ref 8.9–10.3)
Creatinine, Ser: 4.09 mg/dL — ABNORMAL HIGH (ref 0.44–1.00)
Creatinine, Ser: 4.12 mg/dL — ABNORMAL HIGH (ref 0.44–1.00)
GFR calc Af Amer: 11 mL/min — ABNORMAL LOW (ref >60)
GFR calc Af Amer: 11 mL/min — ABNORMAL LOW (ref >60)
GFR calc non Af Amer: 10 mL/min — ABNORMAL LOW (ref >60)
GFR calc non Af Amer: 10 mL/min — ABNORMAL LOW (ref >60)
GLUCOSE: 113 mg/dL — AB (ref 70–99)
Glucose, Bld: 225 mg/dL — ABNORMAL HIGH (ref 70–99)
POTASSIUM: 4.7 mmol/L (ref 3.5–5.1)
POTASSIUM: 4.8 mmol/L (ref 3.5–5.1)
SODIUM: 138 mmol/L (ref 135–145)
SODIUM: 141 mmol/L (ref 135–145)
Total Bilirubin: 0.7 mg/dL (ref 0.3–1.2)
Total Bilirubin: 0.7 mg/dL (ref 0.3–1.2)
Total Protein: 6.6 g/dL (ref 6.5–8.1)
Total Protein: 6.8 g/dL (ref 6.5–8.1)

## 2018-04-10 LAB — BODY FLUID CELL COUNT WITH DIFFERENTIAL
EOS FL: 0 %
Lymphs, Fluid: 77 %
Monocyte-Macrophage-Serous Fluid: 13 %
Neutrophil Count, Fluid: 10 %
Other Cells, Fluid: 0 %
Total Nucleated Cell Count, Fluid: 964 cu mm

## 2018-04-10 LAB — ECHOCARDIOGRAM COMPLETE
Height: 62 in
WEIGHTICAEL: 1774.26 [oz_av]

## 2018-04-10 LAB — CBC
HCT: 32.3 % — ABNORMAL LOW (ref 35.0–47.0)
HEMATOCRIT: 33.6 % — AB (ref 35.0–47.0)
HEMOGLOBIN: 11 g/dL — AB (ref 12.0–16.0)
Hemoglobin: 10.2 g/dL — ABNORMAL LOW (ref 12.0–16.0)
MCH: 28.4 pg (ref 26.0–34.0)
MCH: 28.1 pg (ref 26.0–34.0)
MCHC: 32.6 g/dL (ref 32.0–36.0)
MCHC: 31.6 g/dL — AB (ref 32.0–36.0)
MCV: 87.2 fL (ref 80.0–100.0)
MCV: 88.8 fL (ref 80.0–100.0)
PLATELETS: 413 10*3/uL (ref 150–440)
Platelets: 412 10*3/uL (ref 150–440)
RBC: 3.86 MIL/uL (ref 3.80–5.20)
RBC: 3.64 MIL/uL — ABNORMAL LOW (ref 3.80–5.20)
RDW: 18 % — ABNORMAL HIGH (ref 11.5–14.5)
RDW: 17.4 % — ABNORMAL HIGH (ref 11.5–14.5)
WBC: 6.2 10*3/uL (ref 3.6–11.0)
WBC: 11.6 10*3/uL — ABNORMAL HIGH (ref 3.6–11.0)

## 2018-04-10 LAB — LACTATE DEHYDROGENASE, PLEURAL OR PERITONEAL FLUID: LD, Fluid: 262 U/L — ABNORMAL HIGH (ref 3–23)

## 2018-04-10 LAB — GLUCOSE, CAPILLARY
GLUCOSE-CAPILLARY: 148 mg/dL — AB (ref 70–99)
GLUCOSE-CAPILLARY: 131 mg/dL — AB (ref 70–99)
Glucose-Capillary: 80 mg/dL (ref 70–99)
Glucose-Capillary: 76 mg/dL (ref 70–99)

## 2018-04-10 LAB — MRSA PCR SCREENING: MRSA by PCR: NEGATIVE

## 2018-04-10 LAB — TROPONIN I
Troponin I: 0.07 ng/mL (ref <0.03)
Troponin I: <0.03 ng/mL (ref <0.03)
Troponin I: <0.03 ng/mL (ref <0.03)

## 2018-04-10 LAB — PROTEIN, PLEURAL OR PERITONEAL FLUID: Total protein, fluid: 3.4 g/dL

## 2018-04-10 LAB — BLOOD GAS, ARTERIAL
ACID-BASE DEFICIT: 2.3 mmol/L — AB (ref 0.0–2.0)
Acid-Base Excess: 0.8 mmol/L (ref 0.0–2.0)
BICARBONATE: 28.6 mmol/L — AB (ref 20.0–28.0)
Bicarbonate: 27.7 mmol/L (ref 20.0–28.0)
Delivery systems: POSITIVE
Expiratory PAP: 5
FIO2: 0.4
FIO2: 0.3
Inspiratory PAP: 10
MECHVT: 400 mL
O2 SAT: 95.7 %
O2 Saturation: 93.9 %
PATIENT TEMPERATURE: 37
PCO2 ART: 90 mmHg — AB (ref 32.0–48.0)
PEEP: 5 cmH2O
PO2 ART: 104 mmHg (ref 83.0–108.0)
Patient temperature: 37
RATE: 14 resp/min
RATE: 20 {breaths}/min
pCO2 arterial: 55 mmHg — ABNORMAL HIGH (ref 32.0–48.0)
pH, Arterial: 7.11 — CL (ref 7.350–7.450)
pH, Arterial: 7.31 — ABNORMAL LOW (ref 7.350–7.450)
pO2, Arterial: 77 mmHg — ABNORMAL LOW (ref 83.0–108.0)

## 2018-04-10 LAB — GLUCOSE, PLEURAL OR PERITONEAL FLUID: Glucose, Fluid: 63 mg/dL

## 2018-04-10 LAB — LACTATE DEHYDROGENASE: LDH: 109 U/L (ref 98–192)

## 2018-04-10 LAB — TSH: TSH: 7.642 u[IU]/mL — AB (ref 0.350–4.500)

## 2018-04-10 LAB — MAGNESIUM: MAGNESIUM: 2.3 mg/dL (ref 1.7–2.4)

## 2018-04-10 MED ORDER — MIDAZOLAM HCL 5 MG/5ML IJ SOLN
INTRAMUSCULAR | Status: AC
  Filled 2018-04-10: qty 5

## 2018-04-10 MED ORDER — NOREPINEPHRINE 4 MG/250ML-% IV SOLN
0.0000 ug/min | INTRAVENOUS | Status: DC
  Administered 2018-04-10: 4 ug/min via INTRAVENOUS
  Administered 2018-04-10 – 2018-04-13 (×2): 5 ug/min via INTRAVENOUS
  Administered 2018-04-14: 4 ug/min via INTRAVENOUS
  Administered 2018-04-15: 2 ug/min via INTRAVENOUS
  Filled 2018-04-10 (×5): qty 250

## 2018-04-10 MED ORDER — FAMOTIDINE IN NACL 20-0.9 MG/50ML-% IV SOLN: 20.0000 mg | Freq: Two times a day (BID) | INTRAVENOUS | Status: DC

## 2018-04-10 MED ORDER — FENTANYL CITRATE (PF) 100 MCG/2ML IJ SOLN
100.0000 ug | INTRAMUSCULAR | Status: DC | PRN
  Filled 2018-04-10: qty 2

## 2018-04-10 MED ORDER — ONDANSETRON HCL 4 MG PO TABS: 4.0000 mg | ORAL_TABLET | Freq: Four times a day (QID) | ORAL | Status: DC | PRN

## 2018-04-10 MED ORDER — DEXTROSE 50 % IV SOLN
INTRAVENOUS | Status: AC
  Administered 2018-04-10: 50 mL via INTRAVENOUS
  Filled 2018-04-10: qty 50

## 2018-04-10 MED ORDER — ONDANSETRON HCL 4 MG/2ML IJ SOLN: 4.0000 mg | Freq: Four times a day (QID) | INTRAMUSCULAR | Status: DC | PRN

## 2018-04-10 MED ORDER — VANCOMYCIN HCL 500 MG IV SOLR
500.0000 mg | INTRAVENOUS | Status: DC
  Administered 2018-04-11 – 2018-04-13 (×2): 500 mg via INTRAVENOUS
  Filled 2018-04-10 (×2): qty 500

## 2018-04-10 MED ORDER — ASPIRIN 81 MG PO CHEW
81.0000 mg | CHEWABLE_TABLET | Freq: Every day | ORAL | Status: DC
  Filled 2018-04-10: qty 1

## 2018-04-10 MED ORDER — LATANOPROST 0.005 % OP SOLN
1.0000 [drp] | Freq: Every day | OPHTHALMIC | Status: DC
  Administered 2018-04-10 – 2018-04-18 (×9): 1 [drp] via OPHTHALMIC
  Filled 2018-04-10 (×3): qty 2.5

## 2018-04-10 MED ORDER — SPIRONOLACTONE 25 MG PO TABS: 25.0000 mg | ORAL_TABLET | ORAL | Status: DC

## 2018-04-10 MED ORDER — MIDAZOLAM HCL 2 MG/2ML IJ SOLN
INTRAMUSCULAR | Status: AC
  Administered 2018-04-10: 2 mg via INTRAVENOUS
  Filled 2018-04-10: qty 4

## 2018-04-10 MED ORDER — IPRATROPIUM-ALBUTEROL 0.5-2.5 (3) MG/3ML IN SOLN
3.0000 mL | Freq: Four times a day (QID) | RESPIRATORY_TRACT | Status: DC
  Administered 2018-04-10 – 2018-04-15 (×20): 3 mL via RESPIRATORY_TRACT
  Filled 2018-04-10 (×20): qty 3

## 2018-04-10 MED ORDER — ASPIRIN 81 MG PO CHEW
81.0000 mg | CHEWABLE_TABLET | Freq: Every day | ORAL | Status: DC
  Administered 2018-04-11 – 2018-04-16 (×6): 81 mg
  Filled 2018-04-10 (×6): qty 1

## 2018-04-10 MED ORDER — FENTANYL CITRATE (PF) 100 MCG/2ML IJ SOLN
INTRAMUSCULAR | Status: AC
  Filled 2018-04-10: qty 4

## 2018-04-10 MED ORDER — FENTANYL CITRATE (PF) 100 MCG/2ML IJ SOLN
100.0000 ug | INTRAMUSCULAR | Status: DC | PRN
  Administered 2018-04-10 – 2018-04-11 (×4): 100 ug via INTRAVENOUS
  Administered 2018-04-14: 25 ug via INTRAVENOUS
  Filled 2018-04-10 (×4): qty 2

## 2018-04-10 MED ORDER — DEXMEDETOMIDINE HCL IN NACL 400 MCG/100ML IV SOLN
0.0000 ug/kg/h | INTRAVENOUS | Status: AC
  Administered 2018-04-10: 0.8 ug/kg/h via INTRAVENOUS
  Administered 2018-04-10 – 2018-04-11 (×2): 0.4 ug/kg/h via INTRAVENOUS
  Administered 2018-04-13: 0.2 ug/kg/h via INTRAVENOUS
  Filled 2018-04-10 (×4): qty 100

## 2018-04-10 MED ORDER — DOCUSATE SODIUM 50 MG/5ML PO LIQD: 100.0000 mg | Freq: Two times a day (BID) | ORAL | Status: DC | PRN

## 2018-04-10 MED ORDER — HEPARIN SODIUM (PORCINE) 5000 UNIT/ML IJ SOLN
5000.0000 [IU] | Freq: Three times a day (TID) | INTRAMUSCULAR | Status: DC
  Administered 2018-04-10 – 2018-04-13 (×9): 5000 [IU] via SUBCUTANEOUS
  Filled 2018-04-10 (×9): qty 1

## 2018-04-10 MED ORDER — MIRTAZAPINE 15 MG PO TABS
7.5000 mg | ORAL_TABLET | Freq: Every day | ORAL | Status: DC
  Administered 2018-04-10 – 2018-04-15 (×6): 7.5 mg via ORAL
  Filled 2018-04-10 (×6): qty 1

## 2018-04-10 MED ORDER — SUCCINYLCHOLINE CHLORIDE 20 MG/ML IJ SOLN
50.0000 mg | Freq: Once | INTRAMUSCULAR | Status: AC
  Administered 2018-04-10: 50 mg via INTRAVENOUS

## 2018-04-10 MED ORDER — DEXTROSE 50 % IV SOLN
1.0000 | Freq: Once | INTRAVENOUS | Status: AC
  Administered 2018-04-10: 50 mL via INTRAVENOUS

## 2018-04-10 MED ORDER — ORAL CARE MOUTH RINSE
15.0000 mL | OROMUCOSAL | Status: DC
  Administered 2018-04-10 – 2018-04-19 (×88): 15 mL via OROMUCOSAL

## 2018-04-10 MED ORDER — MIDAZOLAM HCL 2 MG/2ML IJ SOLN
2.0000 mg | Freq: Once | INTRAMUSCULAR | Status: AC
  Administered 2018-04-10: 2 mg via INTRAVENOUS

## 2018-04-10 MED ORDER — ACETAMINOPHEN 325 MG PO TABS: 650.0000 mg | ORAL_TABLET | Freq: Four times a day (QID) | ORAL | Status: DC | PRN

## 2018-04-10 MED ORDER — CHLORHEXIDINE GLUCONATE 0.12% ORAL RINSE (MEDLINE KIT)
15.0000 mL | Freq: Two times a day (BID) | OROMUCOSAL | Status: DC
  Administered 2018-04-10 – 2018-04-19 (×17): 15 mL via OROMUCOSAL

## 2018-04-10 MED ORDER — SODIUM CHLORIDE 0.9% FLUSH
10.0000 mL | Freq: Two times a day (BID) | INTRAVENOUS | Status: DC
  Administered 2018-04-10 – 2018-04-12 (×6): 10 mL
  Administered 2018-04-13: 40 mL
  Administered 2018-04-13 – 2018-04-14 (×2): 10 mL
  Administered 2018-04-14: 40 mL
  Administered 2018-04-15 (×2): 10 mL
  Administered 2018-04-16: 30 mL
  Administered 2018-04-16: 10 mL
  Administered 2018-04-17: 40 mL
  Administered 2018-04-17: 10 mL
  Administered 2018-04-18: 30 mL
  Administered 2018-04-18 – 2018-04-19 (×2): 10 mL

## 2018-04-10 MED ORDER — DEXTROSE IN LACTATED RINGERS 5 % IV SOLN
INTRAVENOUS | Status: DC
  Administered 2018-04-10 – 2018-04-11 (×2): via INTRAVENOUS

## 2018-04-10 MED ORDER — FAMOTIDINE IN NACL 20-0.9 MG/50ML-% IV SOLN: 20.0000 mg | Freq: Every day | INTRAVENOUS | Status: DC

## 2018-04-10 MED ORDER — LEVOTHYROXINE SODIUM 50 MCG PO TABS
25.0000 ug | ORAL_TABLET | Freq: Every day | ORAL | Status: DC
  Filled 2018-04-10: qty 1

## 2018-04-10 MED ORDER — SUCCINYLCHOLINE CHLORIDE 20 MG/ML IJ SOLN
INTRAMUSCULAR | Status: AC
  Filled 2018-04-10: qty 1

## 2018-04-10 MED ORDER — LEVOTHYROXINE SODIUM 50 MCG PO TABS
25.0000 ug | ORAL_TABLET | Freq: Every day | ORAL | Status: DC
  Administered 2018-04-11: 25 ug
  Filled 2018-04-10: qty 1

## 2018-04-10 MED ORDER — ACETAMINOPHEN 650 MG RE SUPP: 650.0000 mg | Freq: Four times a day (QID) | RECTAL | Status: DC | PRN

## 2018-04-10 MED ORDER — VANCOMYCIN HCL 10 G IV SOLR
1250.0000 mg | Freq: Once | INTRAVENOUS | Status: AC
  Administered 2018-04-10: 1250 mg via INTRAVENOUS
  Filled 2018-04-10: qty 1250

## 2018-04-10 MED ORDER — SODIUM CHLORIDE 0.9% FLUSH: 10.0000 mL | INTRAVENOUS | Status: DC | PRN

## 2018-04-10 NOTE — Significant Event (Signed)
Rapid Response Event Note  Overview: Time Called: 1049 Arrival Time: 1051 Event Type: Respiratory  Initial Focused Assessment: called for rapid response in ultrasound during thoracentesis for decreased 02 sats in pt   Interventions: Dr Grace IsaacWatts (radiology) finished thoracentesis and pt 02 sats returned to normal with 400ml fluid removed. Dr Grace IsaacWatts to inform Hospitalist of situation.  Plan of Care (if not transferred): Ultrasound techs to call if further assistance needed.  Event Summary: Name of Physician Notified: Dr Grace IsaacWatts (Radiology) in room at 1051    at    Outcome: Stayed in room and stabalized(pt remained in ultrasound)  Event End Time: 1107  Malissia Rabbani A

## 2018-04-10 NOTE — H&P (Addendum)
[facility] Physicians - [place] at [facility]   PATIENT NAME: Ann Cunningham    MR#:  [id number]  DATE OF BIRTH:  03/26/1941  DATE OF ADMISSION:  04/20/2018  PRIMARY CARE PHYSICIAN: Center, [facility]   REQUESTING/REFERRING PHYSICIAN: Derrill Kay, MD  CHIEF COMPLAINT:   Chief Complaint  Patient presents with  . Shortness of Breath    HISTORY OF PRESENT ILLNESS:  Ann Cunningham  is a 77 y.o. female who presents with shortness of breath.  Patient is a dialysis patient, and missed dialysis today because she felt so bad.  Here in the ED she was found on imaging to have bilateral pleural effusions.  Hospitalist were called for admission  PAST MEDICAL HISTORY:   Past Medical History:  Diagnosis Date  . Anemia   . Cardiomyopathy (HCC)   . CHF (congestive heart failure) (HCC)   . CKD (chronic kidney disease)   . Glaucoma   . Hypertension   . Hypothyroidism   . Pressure ulcer of sacrum   . Renal dialysis device, implant, or graft complication   . Renal disorder   . Uses walker      PAST SURGICAL HISTORY:   Past Surgical History:  Procedure Laterality Date  . BASCILIC VEIN TRANSPOSITION Right 10/19/2017   Procedure: Radiocephalic fistula creation;  Surgeon: Renford Dills, MD;  Location: [facility] ORS;  Service: Vascular;  Laterality: Right;  . CAPD INSERTION N/A 08/15/2017   Procedure: LAPAROSCOPIC INSERTION CONTINUOUS AMBULATORY PERITONEAL DIALYSIS  (CAPD) CATHETER;  Surgeon: Renford Dills, MD;  Location: [facility] ORS;  Service: Vascular;  Laterality: N/A;  . CATARACT EXTRACTION W/ INTRAOCULAR LENS IMPLANT    . CATARACT EXTRACTION W/PHACO Right 12/25/2016   Procedure: CATARACT EXTRACTION PHACO AND INTRAOCULAR LENS PLACEMENT (IOC)  Right complicated;  Surgeon: Sherald Hess, MD;  Location: [facility];  Service: Ophthalmology;  Laterality: Right;  Vision Blue  . DIALYSIS/PERMA CATHETER INSERTION N/A 08/20/2017   Procedure:  DIALYSIS/PERMA CATHETER INSERTION;  Surgeon: Annice Needy, MD;  Location: [facility] INVASIVE CV LAB;  Service: Cardiovascular;  Laterality: N/A;  . DIALYSIS/PERMA CATHETER REMOVAL N/A 02/04/2018   Procedure: DIALYSIS/PERMA CATHETER REMOVAL;  Surgeon: Annice Needy, MD;  Location: [facility] INVASIVE CV LAB;  Service: Cardiovascular;  Laterality: N/A;  . REMOVAL OF A DIALYSIS CATHETER N/A 10/19/2017   Procedure: REMOVAL OF A DIALYSIS CATHETER;  Surgeon: Renford Dills, MD;  Location: [facility] ORS;  Service: Vascular;  Laterality: N/A;     SOCIAL HISTORY:   Social History   Tobacco Use  . Smoking status: Former Smoker    Packs/day: 0.50    Types: Cigarettes    Last attempt to quit: 06/14/2017    Years since quitting: 0.8  . Smokeless tobacco: Former Neurosurgeon    Types: Snuff  Substance Use Topics  . Alcohol use: No     FAMILY HISTORY:   Family History  Problem Relation Age of Onset  . Hypertension Mother   . CVA Mother   . CAD Father      DRUG ALLERGIES:   Allergies  Allergen Reactions  . Penicillins Other (See Comments)    Has patient had a PCN reaction causing immediate rash, facial/tongue/throat swelling, SOB or lightheadedness with hypotension: Unknown Has patient had a PCN reaction causing severe rash involving mucus membranes or skin necrosis: Unknown Has patient had a PCN reaction that required hospitalization: Unknown Has patient had a PCN reaction occurring within the last 10 years: Unknown If all of the above  answers are "NO", then may proceed with Cephalosporin use.     MEDICATIONS AT HOME:   Prior to Admission medications   Medication Sig Start Date End Date Taking? Authorizing Provider  aspirin 81 MG chewable tablet Chew 81 mg by mouth daily.   Yes [provider]  bisacodyl (BISACODYL) 5 MG EC tablet Take 2 tablets (10 mg total) by mouth daily as needed for moderate constipation. 02/13/18  Yes Enedina Finner, MD  Brinzolamide-Brimonidine 1-0.2 % SUSP Apply 1 drop to  eye 3 (three) times daily.   Yes [provider]  diclofenac sodium (VOLTAREN) 1 % GEL Apply 2 g topically 4 (four) times daily. Apply to right shoulder   Yes [provider]  ethyl chloride spray Apply 1 application topically 3 (three) times a week. Tuesday, Thursday and Saturday   Yes [provider]  HYDROcodone-acetaminophen (NORCO) 5-325 MG tablet Take 1-2 tablets by mouth every 6 (six) hours as needed. 01/11/18  Yes Sainani, Rolly Pancake, MD  latanoprost (XALATAN) 0.005 % ophthalmic solution Place 1 drop into both eyes at bedtime. 12/17/17  Yes [provider]  levothyroxine (SYNTHROID, LEVOTHROID) 25 MCG tablet Take 25 mcg by mouth daily before breakfast.   Yes [provider]  midodrine (PROAMATINE) 5 MG tablet Take 5 mg by mouth 3 (three) times a week. Give prior to dialysis on Tuesdays, Thursdays and Saturdays.   Yes [provider]  mirtazapine (REMERON) 7.5 MG tablet Take 1 tablet by mouth at bedtime. 12/11/17  Yes [provider]  multivitamin (RENA-VIT) TABS tablet Take 1 tablet by mouth at bedtime. 08/22/17  Yes Wieting, Richard, MD  polyethylene glycol (MIRALAX / GLYCOLAX) packet Take 17 g by mouth daily.   Yes [provider]  sodium chloride (MURO 128) 5 % ophthalmic ointment Place 1 application into the left eye 3 (three) times daily. 10/01/15  Yes [provider]  sodium chloride (MURO 128) 5 % ophthalmic solution Place 1 drop into the left eye 3 (three) times daily.   Yes [provider]  spironolactone (ALDACTONE) 25 MG tablet Take 1 tablet by mouth every Monday, Wednesday, and Friday. 11/30/17  Yes [provider]  brimonidine (ALPHAGAN) 0.2 % ophthalmic solution Place 1 drop into both eyes daily as needed (glaucoma). Patient not taking: Reported on 2018/04/11 08/22/17   Alford Highland, MD    REVIEW OF SYSTEMS:  Review of Systems  Constitutional: Negative for chills, fever,  malaise/fatigue and weight loss.  HENT: Negative for ear pain, hearing loss and tinnitus.   Eyes: Negative for blurred vision, double vision, pain and redness.  Respiratory: Positive for shortness of breath. Negative for cough and hemoptysis.   Cardiovascular: Negative for chest pain, palpitations, orthopnea and leg swelling.  Gastrointestinal: Negative for abdominal pain, constipation, diarrhea, nausea and vomiting.  Genitourinary: Negative for dysuria, frequency and hematuria.  Musculoskeletal: Negative for back pain, joint pain and neck pain.  Skin:       No acne, rash, or lesions  Neurological: Negative for dizziness, tremors, focal weakness and weakness.  Endo/Heme/Allergies: Negative for polydipsia. Does not bruise/bleed easily.  Psychiatric/Behavioral: Negative for depression. The patient is not nervous/anxious and does not have insomnia.      VITAL SIGNS:   Vitals:   04-11-18 2114 2018/04/11 2200 2018-04-11 2300 04/10/18 0030  BP:  (!) 161/86 (!) 144/78 140/80  Pulse:  95 98   Resp:  20 (!) 34   Temp:      TempSrc:  SpO2:  97% 99%   Weight: 56.7 kg (125 lb)     Height: 5' (1.524 m)      Wt Readings from Last 3 Encounters:  04/05/2018 56.7 kg (125 lb)  02/13/18 57.4 kg (126 lb 9.6 oz)  02/04/18 60.3 kg (133 lb)    PHYSICAL EXAMINATION:  Physical Exam  Vitals reviewed. Constitutional: She is oriented to person, place, and time. She appears well-developed and well-nourished. No distress.  HENT:  Head: Normocephalic and atraumatic.  Mouth/Throat: Oropharynx is clear and moist.  Eyes: Pupils are equal, round, and reactive to light. Conjunctivae and EOM are normal. No scleral icterus.  Neck: Normal range of motion. Neck supple. No JVD present. No thyromegaly present.  Cardiovascular: Normal rate, regular rhythm and intact distal pulses. Exam reveals no gallop and no friction rub.  No murmur heard. Respiratory: Effort normal. No respiratory distress. She has no wheezes.  She has rales.  GI: Soft. Bowel sounds are normal. She exhibits no distension. There is no tenderness.  Musculoskeletal: Normal range of motion. She exhibits no edema.  No arthritis, no gout  Lymphadenopathy:    She has no cervical adenopathy.  Neurological: She is alert and oriented to person, place, and time. No cranial nerve deficit.  No dysarthria, no aphasia  Skin: Skin is warm and dry. No rash noted. No erythema.  Psychiatric: She has a normal mood and affect. Her behavior is normal. Judgment and thought content normal.    LABORATORY PANEL:   CBC Recent Labs  Lab 04/16/2018 2125  WBC 7.1  HGB 10.1*  HCT 31.2*  PLT 389   ------------------------------------------------------------------------------------------------------------------  Chemistries  Recent Labs  Lab 04/10/18 0001  NA 141  K 4.8  CL 102  CO2 29  GLUCOSE 113*  BUN 71*  CREATININE 4.09*  CALCIUM 8.6*  AST 30  ALT 21  ALKPHOS 118  BILITOT 0.7   ------------------------------------------------------------------------------------------------------------------  Cardiac Enzymes Recent Labs  Lab 04/10/18 0001  TROPONINI <0.03   ------------------------------------------------------------------------------------------------------------------  RADIOLOGY:  Ct Abdomen Pelvis Wo Contrast  Result Date: 03/29/2018 CLINICAL DATA:  Patient refused dialysis today and presents with abdominal pain and tachycardia. EXAM: CT CHEST, ABDOMEN AND PELVIS WITHOUT CONTRAST TECHNIQUE: Multidetector CT imaging of the chest, abdomen and pelvis was performed following the standard protocol without IV contrast. COMPARISON:  CXR 04/14/2018 FINDINGS: CT CHEST FINDINGS Cardiovascular: Cardiomegaly with three-vessel coronary arteriosclerosis. Trace pericardial effusion. Minimal aortic atherosclerosis without aneurysm. Mild dilatation of the main pulmonary artery to 3 cm may reflect a component of chronic pulmonary hypertension.  Mediastinum/Nodes: No thyromegaly. Midline patent trachea and mainstem bronchi. No adenopathy. Esophagus is unremarkable. Lungs/Pleura: Moderate left and moderate to large right pleural effusions with adjacent compressive atelectasis. Bronchiectasis to both lower lobes. Subpleural atelectasis in the lingula left lower lobe. Musculoskeletal: Thoracic spondylosis. No aggressive osseous lesions. CT ABDOMEN PELVIS FINDINGS Hepatobiliary: Markedly distended gallbladder with punctate dependent calculus noted. No wall thickening or pericholecystic fluid. The unenhanced liver is unremarkable. Portal vein is difficult to assess given lack of IV contrast there is some heterogeneity seen within. Pancreas: Atrophic without acute abnormality. Spleen: Normal Adrenals/Urinary Tract: Atrophic kidneys. No nephrolithiasis nor hydroureteronephrosis. The urinary bladder demonstrates tiny layering calculi along the left posterior wall. Stomach/Bowel: Stomach is within normal limits. Appendix appears normal. No evidence of bowel wall thickening, distention, or inflammatory changes. Vascular/Lymphatic: Nonaneurysmal moderate atherosclerosis of the aorta and branch vessels. No lymphadenopathy. Reproductive: Uterus and bilateral adnexa are unremarkable. Other: Diffuse soft tissue anasarca. Phleboliths and/or injection granulomata are  identified overlying the buttocks. Musculoskeletal: No acute osseous abnormality. Lumbar spondylosis with degenerative disc disease L4-5 and L5-S1 with multilevel facet arthropathy. IMPRESSION: Chest CT: 1. Cardiomegaly with 3 vessel coronary arteriosclerosis. 2. Bilateral pleural effusions with compressive atelectasis, moderate on the left and moderate to large on the right. 3. Slight dilatation of the main pulmonary artery to 3 cm compatible with chronic pulmonary hypertension. 4. Thoracic spondylosis. CT AP: 1. Marked distention of the gallbladder without pericholecystic fluid or wall thickening. Punctate  dependent calculus is identified within. Findings may represent a hydropic gallbladder or a fasting state of the gallbladder counting for the distention. 2. Atrophic kidneys without obstructive uropathy. Findings would be in keeping with the patient's dialysis dependence. 3. Diffuse soft tissue anasarca. 4. Degenerative disc disease L4-5 and L5-S1. Electronically Signed   By: Tollie Eth M.D.   On: 2018/04/18 23:20   Ct Chest Wo Contrast  Result Date: 18-Apr-2018 CLINICAL DATA:  Patient refused dialysis today and presents with abdominal pain and tachycardia. EXAM: CT CHEST, ABDOMEN AND PELVIS WITHOUT CONTRAST TECHNIQUE: Multidetector CT imaging of the chest, abdomen and pelvis was performed following the standard protocol without IV contrast. COMPARISON:  CXR 04-18-2018 FINDINGS: CT CHEST FINDINGS Cardiovascular: Cardiomegaly with three-vessel coronary arteriosclerosis. Trace pericardial effusion. Minimal aortic atherosclerosis without aneurysm. Mild dilatation of the main pulmonary artery to 3 cm may reflect a component of chronic pulmonary hypertension. Mediastinum/Nodes: No thyromegaly. Midline patent trachea and mainstem bronchi. No adenopathy. Esophagus is unremarkable. Lungs/Pleura: Moderate left and moderate to large right pleural effusions with adjacent compressive atelectasis. Bronchiectasis to both lower lobes. Subpleural atelectasis in the lingula left lower lobe. Musculoskeletal: Thoracic spondylosis. No aggressive osseous lesions. CT ABDOMEN PELVIS FINDINGS Hepatobiliary: Markedly distended gallbladder with punctate dependent calculus noted. No wall thickening or pericholecystic fluid. The unenhanced liver is unremarkable. Portal vein is difficult to assess given lack of IV contrast there is some heterogeneity seen within. Pancreas: Atrophic without acute abnormality. Spleen: Normal Adrenals/Urinary Tract: Atrophic kidneys. No nephrolithiasis nor hydroureteronephrosis. The urinary bladder  demonstrates tiny layering calculi along the left posterior wall. Stomach/Bowel: Stomach is within normal limits. Appendix appears normal. No evidence of bowel wall thickening, distention, or inflammatory changes. Vascular/Lymphatic: Nonaneurysmal moderate atherosclerosis of the aorta and branch vessels. No lymphadenopathy. Reproductive: Uterus and bilateral adnexa are unremarkable. Other: Diffuse soft tissue anasarca. Phleboliths and/or injection granulomata are identified overlying the buttocks. Musculoskeletal: No acute osseous abnormality. Lumbar spondylosis with degenerative disc disease L4-5 and L5-S1 with multilevel facet arthropathy. IMPRESSION: Chest CT: 1. Cardiomegaly with 3 vessel coronary arteriosclerosis. 2. Bilateral pleural effusions with compressive atelectasis, moderate on the left and moderate to large on the right. 3. Slight dilatation of the main pulmonary artery to 3 cm compatible with chronic pulmonary hypertension. 4. Thoracic spondylosis. CT AP: 1. Marked distention of the gallbladder without pericholecystic fluid or wall thickening. Punctate dependent calculus is identified within. Findings may represent a hydropic gallbladder or a fasting state of the gallbladder counting for the distention. 2. Atrophic kidneys without obstructive uropathy. Findings would be in keeping with the patient's dialysis dependence. 3. Diffuse soft tissue anasarca. 4. Degenerative disc disease L4-5 and L5-S1. Electronically Signed   By: Tollie Eth M.D.   On: 18-Apr-2018 23:20   Dg Chest Portable 1 View  Result Date: 04-18-18 CLINICAL DATA:  Shortness of breath. Refused dialysis today. History of CHF. EXAM: PORTABLE CHEST 1 VIEW COMPARISON:  Chest radiograph Feb 12, 2018 FINDINGS: Stable cardiomegaly. Pulmonary vascular congestion. Similar moderate to large bilateral pleural  effusions. Biapical pleural capping. Prominent pleural reflection mid lung zone. No mediastinal shift. Soft tissue planes and included  osseous structures are nonacute. High riding bilateral humeral heads seen with old rotator cuff injuries. IMPRESSION: 1. Moderate to large pleural effusions, possible RIGHT hydropneumothorax without mediastinal shift. Similar underlying consolidation. 2. Stable cardiomegaly and pulmonary vascular congestion. 3. Acute findings discussed with and reconfirmed by Dr.GRAYDON GOODMAN on 04/01/2018 at 9:49 pm. Electronically Signed   By: Awilda Metro M.D.   On: 03/31/2018 21:49    EKG:   Orders placed or performed during the hospital encounter of 04/16/2018  . ED EKG  . ED EKG    IMPRESSION AND PLAN:  Principal Problem:   Bilateral pleural effusion -ultrasound thoracentesis ordered Active Problems:   ESRD on dialysis [facility]) -nephrology consult for dialysis   Acute on chronic combined systolic and diastolic CHF (congestive heart failure) (HCC) -dialysis as above, low utility to diuretics as patient makes minimal to no urine, continue home meds   Hypothyroidism -home dose thyroid replacement  Chart review performed and case discussed with ED provider. Labs, imaging and/or ECG reviewed by provider and discussed with patient/family. Management plans discussed with the patient and/or family.  DVT PROPHYLAXIS: SubQ heparin  GI PROPHYLAXIS: None  ADMISSION STATUS: Observation  CODE STATUS: Full Code Status History    Date Active Date Inactive Code Status Order ID Comments User Context   02/12/2018 1528 02/13/2018 1931 Full Code [id number]  Auburn Bilberry, MD ED   01/10/2018 1536 01/13/2018 0045 Full Code [id number]  Alford Highland, MD ED   10/19/2017 1310 10/20/2017 2106 Full Code [id number]  Adrian Saran, MD Inpatient   08/17/2017 2122 08/22/2017 2014 DNR [id number]  Alford Highland, MD ED   08/14/2017 0459 08/15/2017 1941 Full Code [id number]  Arnaldo Natal, MD Inpatient      TOTAL TIME TAKING CARE OF THIS PATIENT: 40 minutes.   Ann Cunningham FIELDING 04/10/2018, 2:07 AM  [employer]  [facility]  Office  [phone]  CC: Primary care physician; Center, [facility]  Note:  This document was prepared using Conservation officer, historic buildings and may include unintentional dictation errors.

## 2018-04-10 NOTE — Procedures (Signed)
Central Venous Catheter Insertion Procedure Note      Procedure Indication: Acute respiratory failure with hypotension Procedure: Insertion of Central Venous Catheter Procedure consent form signed: Emergent/ Hypotension Procedure Performed by: Roseanne Renoutul Gillian Meeuwsen MD Specimen Removed: None Anesthesia: 1% lidocaine  EBL: Minimal  Complications: No apparent complications    Procedure Details:  Type of line: Subclavian implied.  Risks versus benefits of the procedure were discussed with all questions asked and answered.   A "universal pause" was done per [facility] protocol, confirming correct patient, procedure, consent, equipment, and positioning with bedsdie RN.  The patient was monitored with continuous bedside telemetry, pulse oximetry, and non-invasive blood pressure measurements throughout the duration of the procedure.   Using full barrier sterile precautions, the right neck was prepped with Chlorhexidene and draped in the usual sterile fashion.  Local anesthesia was injected into the subcutaneous tissues.   A trochar needle was then inserted into the vein; dark red blood was easily aspirated into the syringe.  Using the Seldinger technique, the syringe was then removed and a J-tip guide wire was advanced easily through the needle. There were no arrhythmias. The needle was then withdrawn over the wire.  A scalpel was then used to make a small skin nick along the guidewire.  A dilator was then passed over the guidewire without difficulty.  The dilator was then removed and a 8.5 fr. quad lumen central venous catheter was passed over the guide wire to an insertion depth of 16 cm. The guide wire was then removed and the catheter was sutured into place using 2-0 silk suture.  All ports easily aspirated dark red blood and were flushed with sterile saline.  A sterile bio occlusive dressing with Chlorhexidine patch was then applied over the insertion site.  Use of Ultrasound No  Post Procedure: The patient  tolerated the procedure well.  There were no known complications at the conclusion of the procedure.  EBL was less than 5mL.    Xray Pending    Kalimah Capurro Sherryll BurgerShah Pulmonary Critical Care & Sleep Medicine

## 2018-04-10 NOTE — Progress Notes (Signed)
Pharmacy Antibiotic Note  Ann Cunningham is a 77 y.o. female admitted on March 04, 2018 with sepsis. Patient being treated for possible cellulitis/abscess. Pharmacy has been consulted for vancomycin dosing. Patient received regularly scheduled dialysis Tuesday/Thursday/Saturday.   Plan: Vancomycin 1250mg  IV x 1. Will order vancomycin 500mg  IV to be infused during the last hour of dialysis. Will obtain trough prior to 3rd dialysis session on vancomycin.    Height: 5\' 2"  (157.5 cm) Weight: 110 lb 14.3 oz (50.3 kg) IBW/kg (Calculated) : 50.1  Temp (24hrs), Avg:98.2 F (36.8 C), Min:97.9 F (36.6 C), Max:98.6 F (37 C)  Recent Labs  Lab 07-23-18 2125 04/10/18 0001 04/10/18 0417 04/10/18 1229  WBC 7.1  --  6.2 11.6*  CREATININE  --  4.09* 4.18* 4.12*    Estimated Creatinine Clearance: 9 mL/min (A) (by C-G formula based on SCr of 4.12 mg/dL (H)).    Allergies  Allergen Reactions  . Penicillins Other (See Comments)    Has patient had a PCN reaction causing immediate rash, facial/tongue/throat swelling, SOB or lightheadedness with hypotension: Unknown Has patient had a PCN reaction causing severe rash involving mucus membranes or skin necrosis: Unknown Has patient had a PCN reaction that required hospitalization: Unknown Has patient had a PCN reaction occurring within the last 10 years: Unknown If all of the above answers are "NO", then may proceed with Cephalosporin use.     Antimicrobials this admission: Vancomycin 7/17 >>   Dose adjustments this admission: N/A  Microbiology results: 7/17 BCx: pending  7/17 Body fluid Culture: pending  7/17 MRSA PCR: negative   Thank you for allowing pharmacy to be a part of this patient's care.  Ann Cunningham L 04/10/2018 3:52 PM

## 2018-04-10 NOTE — Progress Notes (Signed)
Rapid called placed on 6 L of O2

## 2018-04-10 NOTE — Progress Notes (Signed)
   04/10/18 1136  Clinical Encounter Type  Visited With Patient not available  Visit Type Initial;Spiritual support;Critical Care  Referral From Nurse  Consult/Referral To Chaplain  Spiritual Encounters  Spiritual Needs Prayer   [facility] received a rapid response page for room 211. When I arrived at the patient's room she was being wheeled out in route to the ICU. I followed up in the ICU and patient was being cared for by her care team. I offered silent pray and [facility].

## 2018-04-10 NOTE — Procedures (Signed)
    PCCM--PROCEDURE NOTE Chest Tube  Patient: Ann CoolerWilma Cunningham HAR: @SHCHARID @ Day of Procedure: 04/10/2018 Procedure Time: start: 1:30 Stop at 2 pm    Pre-op Dx: Pleural Effusion Post-op Dx: Same  Procedure:  Chest tube placement Procedure consent form signed: Emergent  Surgeon: Roseanne Renoutul Dayanara Sherrill Assistant: None Specimen Removed: None Medication: 1% lidocaine  EBL: Minimal  Complications: No apparent complications   Procedure done as an emergent procedure, scanned into chart. Risks explained, including but not limited to bleeding, infection, and pneumothorax. CXR and pertinent lab data reviewed.  Time out performed at bedside prior to procedure.  Coagulation  Lab Results  Component Value Date   INR 1.02 10/15/2017   APTT 32 10/15/2017     Patient prepped and draped in usual sterile fashion. US done and found proper space to insert needle.  Pt given 5 mL of 1% lidocaine for local anesthetic. Needle then easily inserted into pleural space, where straw colored fluid was obtained. At this point a trochar needle was advanced into the pleural space again aspirating straw colored fluid, Needle kept still and with Seldinger technique catheter is inserted, Once catheter is advanced we removed the needle , the catheter was attached to a collection system, and pleral fluid is drained. Hemostasis achieved, without evidence of excess bleeding at site.  EBL <425mL.  Fluid was sent for labs/culture data as ordered. Fluid was sent for cytology.  CXR ordered, pending.   Roseanne Renoutul Ulrick Methot MD Pulmonary Critical Care Medicine

## 2018-04-10 NOTE — Progress Notes (Signed)
*  PRELIMINARY RESULTS* Echocardiogram 2D Echocardiogram has been performed.  Cristela BlueHege, [place]ville Brick 04/10/2018, 3:45 PM

## 2018-04-10 NOTE — Procedures (Signed)
Pre procedural Dx: Symptomatic Pleural effusion Post procedural Dx: Same  Successful US guided right sided thoracentesis yielding 400 cc of blood tinged pleural fluid.    EBL: None  Complications: Patient experienced a sudden transient decreased oxygen saturation to the level of 40s necessitating calling of a rapid response, however her oxygen saturation levels normalized to the mid 90s with the administration of supplement total oxygen and postprocedural chest radiograph demonstrated resolution of the right-sided effusion with no change in the size of the right basilar pneumothorax and no additional intervention was required.   Katherina RightJay Yomaris Palecek, MD Pager #: [phone]

## 2018-04-10 NOTE — Procedures (Signed)
   Endotracheal  Intubation Procedure Note  Performed By:  Latia Mataya, MD   Assistant:   RN, RT  Medications given were: Succynylcholine 50 and Versed 4   Preoxygenated with BVM ventilation   Oral cavity was cleared;  Mac blade with camera was used to visualize the vocal cords;  cords moved symmetrically.  A number  7.5   cuffed ETT was used to intubate the trachea;  ETT secured at : 23 cm at the teeth.    Placement was evaluated by noting: bilateral, symmetric breath sounds,  good end-tidal CO2 detector color change to yellow ,  Equal breath sounds bilaterally over chest;   no breath sounds over stomach and  chest x-ray visualization.    Attempts required:  1  Complications: none.    The procedure was tolerated well  Estimated Blood Loss:                     cxr- post intubation: Tube in good position   Merranda Bolls, MD INTENSIVIST-PULMONOLOGIST  

## 2018-04-10 NOTE — Progress Notes (Signed)
   Endotracheal  Intubation Procedure Note  Performed By:  Lahoma Rocker, MD   Assistant:   RN, RT  Medications given were: Succynylcholine 50 and Versed 4   Preoxygenated with BVM ventilation   Oral cavity was cleared;  Mac blade with camera was used to visualize the vocal cords;  cords moved symmetrically.  A number  7.5   cuffed ETT was used to intubate the trachea;  ETT secured at : 23 cm at the teeth.    Placement was evaluated by noting: bilateral, symmetric breath sounds,  good end-tidal CO2 detector color change to yellow ,  Equal breath sounds bilaterally over chest;   no breath sounds over stomach and  chest x-ray visualization.    Attempts required:  1  Complications: none.    The procedure was tolerated well  Estimated Blood Loss:                     cxr- post intubation: Tube in good position   Lahoma Rocker, MD INTENSIVIST-PULMONOLOGIST

## 2018-04-10 NOTE — Plan of Care (Signed)
Ultrasound team contacted me to inform they had called a rapid on patient d/t patient decline (unresponsive and SPO2 of 39% on RA. I observed an extended period of apnea on tele screen and went down to US to assist.  When I arrived - patient had been placed on 6L [place] (SPO2 100%),  BP 177/100 and HR 113, Resp 28.  Rhonchi noted bilaterally. Patient returned to the floor to be re-assessed by physicians, who noted respiratory decline and distress - placed order to be transferred to the unit.  Report given and patient transferred to ICU04.

## 2018-04-10 NOTE — Progress Notes (Signed)
[employer] [facility] [place] [facility]                                                                                                                                                                                  Patient Demographics   Jaelene Garciagarcia, is a 77 y.o. female, DOB - June 07, 1941, ZOX:[id number]  Admit date - 04/24/2018   Admitting Physician Oralia Manis, MD  Outpatient Primary MD for the patient is [facility]   LOS - 0  Subjective:  Called by nurse due to patient having worsening shortness of breath on 6 L now Sent had thoracentesis done earlier today I was called by radiologist who stated that he removed most of fluid.  Post procedure there was a small amount of pneumothorax he recommended a follow-up chest x-ray in 2 hours. Patient currently more short of breath  Review of Systems:   CONSTITUTIONAL: No documented fever. No fatigue, weakness. No weight gain, no weight loss.  EYES: No blurry or double vision.  ENT: No tinnitus. No postnasal drip. No redness of the oropharynx.  RESPIRATORY: No cough, no wheeze, no hemoptysis.   +  dyspnea.  CARDIOVASCULAR: No chest pain. No orthopnea. No palpitations. No syncope.  GASTROINTESTINAL: No nausea, no vomiting or diarrhea. No abdominal pain. No melena or hematochezia.  GENITOURINARY: No dysuria or hematuria.  ENDOCRINE: No polyuria or nocturia. No heat or cold intolerance.  HEMATOLOGY: No anemia. No bruising. No bleeding.  INTEGUMENTARY: No rashes. No lesions.  MUSCULOSKELETAL: No arthritis. No swelling. No gout.  NEUROLOGIC: No numbness, tingling, or ataxia. No seizure-type activity.  PSYCHIATRIC: No anxiety. No insomnia. No ADD.    Vitals:   Vitals:   04/10/18 1050 04/10/18 1057 04/10/18 1103 04/10/18 1135  BP:  (!) 196/104 (!) 177/100 (!) 181/91  Pulse:  (!) 112 (!) 113 (!) 116  Resp:      Temp:      TempSrc:      SpO2: (!) 39% 100% 100% 100%  Weight:      Height:        Wt  Readings from Last 3 Encounters:  04/10/18 49.4 kg (108 lb 14.5 oz)  02/13/18 57.4 kg (126 lb 9.6 oz)  02/04/18 60.3 kg (133 lb)    No intake or output data in the 24 hours ending 04/10/18 1137  Physical Exam:   GENERAL: Patient critically ill having rapid respiration HEAD, EYES, EARS, NOSE AND THROAT: Atraumatic, normocephalic. Extraocular muscles are intact. Pupils equal and reactive to light. Sclerae anicteric. No conjunctival injection. No oro-pharyngeal erythema.  NECK: Supple. There is no jugular venous distention. No bruits, no lymphadenopathy, no thyromegaly.  HEART: Regular  rate and rhythm,. No murmurs, no rubs, no clicks.  LUNGS: Diminished breath sounds ABDOMEN: Soft, flat, nontender, nondistended. Has good bowel sounds. No hepatosplenomegaly appreciated.  EXTREMITIES: No evidence of any cyanosis, clubbing, or peripheral edema.  +2 pedal and radial pulses bilaterally.  NEUROLOGIC: The patient is alert, awake, and oriented x3 with no focal motor or sensory deficits appreciated bilaterally.  SKIN: Moist and warm with no rashes appreciated.  Psych: Not anxious, depressed LN: No inguinal LN enlargement    Antibiotics   Anti-infectives (From admission, onward)   None      Medications   Scheduled Meds: . aspirin  81 mg Oral Daily  . heparin  5,000 Units Subcutaneous Q8H  . latanoprost  1 drop Both Eyes QHS  . levothyroxine  25 mcg Oral QAC breakfast  . mirtazapine  7.5 mg Oral QHS   Continuous Infusions: PRN Meds:.acetaminophen **OR** acetaminophen, ondansetron **OR** ondansetron (ZOFRAN) IV   Data Review:   Micro Results Recent Results (from the past 240 hour(s))  MRSA PCR Screening     Status: None   Collection Time: 04/10/18  3:02 AM  Result Value Ref Range Status   MRSA by PCR NEGATIVE NEGATIVE Final    Comment:        The GeneXpert MRSA Assay (FDA approved for NASAL specimens only), is one component of a comprehensive MRSA colonization surveillance  program. It is not intended to diagnose MRSA infection nor to guide or monitor treatment for MRSA infections. Performed at [facility], [address], [place], [place] [address]     Radiology Reports Ct Abdomen Pelvis Wo Contrast  Result Date: 05-08-2018 CLINICAL DATA:  Patient refused dialysis today and presents with abdominal pain and tachycardia. EXAM: CT CHEST, ABDOMEN AND PELVIS WITHOUT CONTRAST TECHNIQUE: Multidetector CT imaging of the chest, abdomen and pelvis was performed following the standard protocol without IV contrast. COMPARISON:  CXR May 08, 2018 FINDINGS: CT CHEST FINDINGS Cardiovascular: Cardiomegaly with three-vessel coronary arteriosclerosis. Trace pericardial effusion. Minimal aortic atherosclerosis without aneurysm. Mild dilatation of the main pulmonary artery to 3 cm may reflect a component of chronic pulmonary hypertension. Mediastinum/Nodes: No thyromegaly. Midline patent trachea and mainstem bronchi. No adenopathy. Esophagus is unremarkable. Lungs/Pleura: Moderate left and moderate to large right pleural effusions with adjacent compressive atelectasis. Bronchiectasis to both lower lobes. Subpleural atelectasis in the lingula left lower lobe. Musculoskeletal: Thoracic spondylosis. No aggressive osseous lesions. CT ABDOMEN PELVIS FINDINGS Hepatobiliary: Markedly distended gallbladder with punctate dependent calculus noted. No wall thickening or pericholecystic fluid. The unenhanced liver is unremarkable. Portal vein is difficult to assess given lack of IV contrast there is some heterogeneity seen within. Pancreas: Atrophic without acute abnormality. Spleen: Normal Adrenals/Urinary Tract: Atrophic kidneys. No nephrolithiasis nor hydroureteronephrosis. The urinary bladder demonstrates tiny layering calculi along the left posterior wall. Stomach/Bowel: Stomach is within normal limits. Appendix appears normal. No evidence of bowel wall thickening, distention, or  inflammatory changes. Vascular/Lymphatic: Nonaneurysmal moderate atherosclerosis of the aorta and branch vessels. No lymphadenopathy. Reproductive: Uterus and bilateral adnexa are unremarkable. Other: Diffuse soft tissue anasarca. Phleboliths and/or injection granulomata are identified overlying the buttocks. Musculoskeletal: No acute osseous abnormality. Lumbar spondylosis with degenerative disc disease L4-5 and L5-S1 with multilevel facet arthropathy. IMPRESSION: Chest CT: 1. Cardiomegaly with 3 vessel coronary arteriosclerosis. 2. Bilateral pleural effusions with compressive atelectasis, moderate on the left and moderate to large on the right. 3. Slight dilatation of the main pulmonary artery to 3 cm compatible with chronic pulmonary hypertension. 4. Thoracic spondylosis. CT AP: 1.  Marked distention of the gallbladder without pericholecystic fluid or wall thickening. Punctate dependent calculus is identified within. Findings may represent a hydropic gallbladder or a fasting state of the gallbladder counting for the distention. 2. Atrophic kidneys without obstructive uropathy. Findings would be in keeping with the patient's dialysis dependence. 3. Diffuse soft tissue anasarca. 4. Degenerative disc disease L4-5 and L5-S1. Electronically Signed   By: Tollie Eth M.D.   On: 2018/04/13 23:20   Dg Chest 1 View  Result Date: 04/10/2018 CLINICAL DATA:  Post right-sided thoracentesis. Known right-sided pneumothorax. EXAM: CHEST  1 VIEW COMPARISON:  Chest radiograph-Apr 13, 2018; 02/12/2018; chest CT-Apr 13, 2018 FINDINGS: Grossly unchanged enlarged cardiac silhouette and mediastinal contours. Interval reduction/resolution of right-sided pleural effusion post thoracentesis. Minimally improved aeration of the right lung base with persistent right basilar hydropneumothorax. No mediastinal shift. Minimally improved aeration the right lower lung with persistent heterogeneous/consolidative opacities. Grossly unchanged small  left-sided effusion with associated left basilar heterogeneous/consolidative opacities. No new focal airspace opacities. Mild pulmonary venous congestion. No acute osseus abnormalities. IMPRESSION: 1. Interval reduction/resolution of right-sided effusion post thoracentesis with residual right basilar pneumothorax. 2. Unchanged small left-sided effusion with associated left basilar heterogeneous/consolidative opacities, atelectasis versus infiltrate. 3. Minimally improved aeration of the right lung base. Critical Value/emergent results were called by telephone at the time of interpretation on 04/10/2018 at 11:09 am to Dr. Allena Katz, who verbally acknowledged these results. Electronically Signed   By: Simonne Come M.D.   On: 04/10/2018 11:08   Ct Chest Wo Contrast  Result Date: 04/13/18 CLINICAL DATA:  Patient refused dialysis today and presents with abdominal pain and tachycardia. EXAM: CT CHEST, ABDOMEN AND PELVIS WITHOUT CONTRAST TECHNIQUE: Multidetector CT imaging of the chest, abdomen and pelvis was performed following the standard protocol without IV contrast. COMPARISON:  CXR Apr 13, 2018 FINDINGS: CT CHEST FINDINGS Cardiovascular: Cardiomegaly with three-vessel coronary arteriosclerosis. Trace pericardial effusion. Minimal aortic atherosclerosis without aneurysm. Mild dilatation of the main pulmonary artery to 3 cm may reflect a component of chronic pulmonary hypertension. Mediastinum/Nodes: No thyromegaly. Midline patent trachea and mainstem bronchi. No adenopathy. Esophagus is unremarkable. Lungs/Pleura: Moderate left and moderate to large right pleural effusions with adjacent compressive atelectasis. Bronchiectasis to both lower lobes. Subpleural atelectasis in the lingula left lower lobe. Musculoskeletal: Thoracic spondylosis. No aggressive osseous lesions. CT ABDOMEN PELVIS FINDINGS Hepatobiliary: Markedly distended gallbladder with punctate dependent calculus noted. No wall thickening or pericholecystic  fluid. The unenhanced liver is unremarkable. Portal vein is difficult to assess given lack of IV contrast there is some heterogeneity seen within. Pancreas: Atrophic without acute abnormality. Spleen: Normal Adrenals/Urinary Tract: Atrophic kidneys. No nephrolithiasis nor hydroureteronephrosis. The urinary bladder demonstrates tiny layering calculi along the left posterior wall. Stomach/Bowel: Stomach is within normal limits. Appendix appears normal. No evidence of bowel wall thickening, distention, or inflammatory changes. Vascular/Lymphatic: Nonaneurysmal moderate atherosclerosis of the aorta and branch vessels. No lymphadenopathy. Reproductive: Uterus and bilateral adnexa are unremarkable. Other: Diffuse soft tissue anasarca. Phleboliths and/or injection granulomata are identified overlying the buttocks. Musculoskeletal: No acute osseous abnormality. Lumbar spondylosis with degenerative disc disease L4-5 and L5-S1 with multilevel facet arthropathy. IMPRESSION: Chest CT: 1. Cardiomegaly with 3 vessel coronary arteriosclerosis. 2. Bilateral pleural effusions with compressive atelectasis, moderate on the left and moderate to large on the right. 3. Slight dilatation of the main pulmonary artery to 3 cm compatible with chronic pulmonary hypertension. 4. Thoracic spondylosis. CT AP: 1. Marked distention of the gallbladder without pericholecystic fluid or wall thickening. Punctate dependent calculus is identified within. Findings may represent  a hydropic gallbladder or a fasting state of the gallbladder counting for the distention. 2. Atrophic kidneys without obstructive uropathy. Findings would be in keeping with the patient's dialysis dependence. 3. Diffuse soft tissue anasarca. 4. Degenerative disc disease L4-5 and L5-S1. Electronically Signed   By: Tollie Eth M.D.   On: 05/01/18 23:20   Dg Chest Portable 1 View  Result Date: 05/01/2018 CLINICAL DATA:  Shortness of breath. Refused dialysis today. History of  CHF. EXAM: PORTABLE CHEST 1 VIEW COMPARISON:  Chest radiograph Feb 12, 2018 FINDINGS: Stable cardiomegaly. Pulmonary vascular congestion. Similar moderate to large bilateral pleural effusions. Biapical pleural capping. Prominent pleural reflection mid lung zone. No mediastinal shift. Soft tissue planes and included osseous structures are nonacute. High riding bilateral humeral heads seen with old rotator cuff injuries. IMPRESSION: 1. Moderate to large pleural effusions, possible RIGHT hydropneumothorax without mediastinal shift. Similar underlying consolidation. 2. Stable cardiomegaly and pulmonary vascular congestion. 3. Acute findings discussed with and reconfirmed by Dr.GRAYDON GOODMAN on 2018/05/01 at 9:49 pm. Electronically Signed   By: Awilda Metro M.D.   On: May 01, 2018 21:49   US Thoracentesis Asp Pleural Space W/img Guide  Result Date: 04/10/2018 INDICATION: Symptomatic right sided pleural effusion. Note, patient has known right basilar hydropneumothorax demonstrated on preceding chest radiograph and chest CT. EXAM: US THORACENTESIS ASP PLEURAL SPACE W/IMG GUIDE COMPARISON:  Chest radiograph-05/01/2018; chest CT-May 01, 2018; MEDICATIONS: None. COMPLICATIONS: SIR LEVEL B - Normal therapy, includes overnight admission for observation. Patient experienced a sudden transient decreased oxygen saturation to the level of 40s necessitating calling of a rapid response, however her oxygen saturation levels normalized to the mid 90s with the administration of supplement total oxygen and postprocedural chest radiograph demonstrated resolution of the right-sided effusion with no change in the size of the right basilar pneumothorax and no additional intervention was required. TECHNIQUE: Informed written consent was obtained from the patient after a discussion of the risks, benefits and alternatives to treatment. A timeout was performed prior to the initiation of the procedure. With the patient positioned left  lateral decubitus, initial ultrasound scanning demonstrates a moderate right-sided anechoic pleural effusion. The lower chest was prepped and draped in the usual sterile fashion. 1% lidocaine was used for local anesthesia. An ultrasound image was saved for documentation purposes. An 8 Fr Safe-T-Centesis catheter was introduced. The thoracentesis was performed. The catheter was removed and a dressing was applied. Patient experienced a sudden transient decreased oxygen saturation to the level of 40s necessitating calling of a rapid response, however her oxygen saturation levels normalized to the mid 90s with the administration of supplement total oxygen. The patient had a portable upright chest radiograph which demonstrated resolution of the right-sided effusion with no change in the size of the right basilar pneumothorax and no additional intervention was required. FINDINGS: A total of approximately 400 cc of blood-tinged fluid was removed. IMPRESSION: Successful ultrasound-guided right sided thoracentesis yielding 400 cc of blood tinged pleural fluid. Patient experienced a sudden transient decreased oxygen saturation to the level of 40s necessitating calling of a rapid response, however her oxygen saturation levels normalized to the mid 90s with the administration of supplement total oxygen and postprocedural chest radiograph demonstrated resolution of the right-sided effusion with no change in the size of the right basilar pneumothorax and no additional intervention was required. Electronically Signed   By: Simonne Come M.D.   On: 04/10/2018 11:20     CBC Recent Labs  Lab May 01, 2018 2125 04/10/18 0417  WBC 7.1 6.2  HGB  10.1* 11.0*  HCT 31.2* 33.6*  PLT 389 412  MCV 86.5 87.2  MCH 28.0 28.4  MCHC 32.4 32.6  RDW 17.6* 18.0*  LYMPHSABS 1.3  --   MONOABS 0.8  --   EOSABS 0.1  --   BASOSABS 0.1  --     Chemistries  Recent Labs  Lab 04/10/18 0001 04/10/18 0417  NA 141 139  K 4.8 4.7  CL 102 101   CO2 29 27  GLUCOSE 113* 97  BUN 71* 73*  CREATININE 4.09* 4.18*  CALCIUM 8.6* 8.4*  AST 30  --   ALT 21  --   ALKPHOS 118  --   BILITOT 0.7  --    ------------------------------------------------------------------------------------------------------------------ estimated creatinine clearance is 8.8 mL/min (A) (by C-G formula based on SCr of 4.18 mg/dL (H)). ------------------------------------------------------------------------------------------------------------------ No results for input(s): HGBA1C in the last 72 hours. ------------------------------------------------------------------------------------------------------------------ No results for input(s): CHOL, HDL, LDLCALC, TRIG, CHOLHDL, LDLDIRECT in the last 72 hours. ------------------------------------------------------------------------------------------------------------------ No results for input(s): TSH, T4TOTAL, T3FREE, THYROIDAB in the last 72 hours.  Invalid input(s): FREET3 ------------------------------------------------------------------------------------------------------------------ No results for input(s): VITAMINB12, FOLATE, FERRITIN, TIBC, IRON, RETICCTPCT in the last 72 hours.  Coagulation profile No results for input(s): INR, PROTIME in the last 168 hours.  No results for input(s): DDIMER in the last 72 hours.  Cardiac Enzymes Recent Labs  Lab 04/10/18 0001  TROPONINI <0.03   ------------------------------------------------------------------------------------------------------------------ Invalid input(s): POCBNP    Assessment & Plan  Patient is a 77 year old white female end-stage renal disease  1.  Acute respiratory failure status post thoracentesis Stat chest Stat ABG I discussed with the pulmonologist regarding transfer patient to the ICU  2.  Bilateral pleural effusion etiology unclear previous echo with significant systolic dysfunction I will repeat echocardiogram of the heart Await  analysis of the fluid  3.  End-stage renal disease hemodialysis per nephrology not be able to do it today  4.  Hypothyroidism continue Synthroid  5.  History of CHF continues spironolactone  6.  CODE STATUS I discussed with the patient she was previously DNR now states that she wants everything done full code         Code Status Orders  (From admission, onward)        Start     Ordered   04/10/18 0253  Full code  Continuous     04/10/18 0252    Code Status History    Date Active Date Inactive Code Status Order ID Comments User Context   02/12/2018 1528 02/13/2018 1931 Full Code [id number]  Auburn BilberryPatel, Jceon Alverio, MD ED   01/10/2018 1536 01/13/2018 0045 Full Code [id number]  Alford HighlandWieting, Richard, MD ED   10/19/2017 1310 10/20/2017 2106 Full Code [id number]  Adrian SaranMody, Sital, MD Inpatient   08/17/2017 2122 08/22/2017 2014 DNR [id number]  Alford HighlandWieting, Richard, MD ED   08/14/2017 0459 08/15/2017 1941 Full Code [id number]  Arnaldo Nataliamond, Michael S, MD Inpatient           Consults intensivist, nephrology  DVT Prophylaxis heparin  Lab Results  Component Value Date   PLT 412 04/10/2018     Time Spent in minutes   45 minutes critical care time spent  Greater than 50% of time spent in care coordination and counseling patient regarding the condition and plan of care.   Auburn BilberryShreyang Shade Kaley M.D on 04/10/2018 at 11:37 AM  Between 7am to 6pm - Pager - [phone]  After 6pm go to [email or web address] - Social research officer, [email or web address] [email or web address]  [employer]   Office  [phone]

## 2018-04-10 NOTE — Consult Note (Signed)
[facility] Consultation      Name: Ann CoolerWilma Cunningham MRN: [id number] DOB: [birth year]    ADMISSION DATE:  04/06/2018 CONSULTATION DATE: 04/10/2018  REFERRING MD :  Allena KatzPATEL   CHIEF COMPLAINT:     Shortness of breath   HISTORY OF PRESENT ILLNESS   77 year old female with ESRD, hypothyroidism, hypertension, and CHF who presented to the emergency department last night for shortness of breath from [facility]. Patient is a dialysis patient with ESRD who refused dialysis on 7/16 as she was not feeling well.  In the ED a chest CT scan was done that showed bilateral pleural effusions. The history is obtained through the patient's chart and nursing report as patient is in respiratory distress.    SIGNIFICANT EVENTS   During thoracentesis this morning (7/17) patient become hypoxic (40s) and a rapid response was called. Patient improved into the 90s with 6L of O2 nasal cannula. Patient transported to the ICU for continued distress and altered mental status.   Upon arrival to ICU patient in continued distress with tachycardia and tachypnea. Patient was placed on BiPAP. ABG on RA showed pH of 7.11 and PCO2 of 90. The decision was made to intubate. Per Dr. Eliane DecreePatel's from this morning, patient has been switched to full code.    PAST MEDICAL HISTORY    :  Past Medical History:  Diagnosis Date  . Anemia   . Cardiomyopathy (HCC)   . CHF (congestive heart failure) (HCC)   . CKD (chronic kidney disease)   . Glaucoma   . Hypertension   . Hypothyroidism   . Pressure ulcer of sacrum   . Renal dialysis device, implant, or graft complication   . Renal disorder   . Uses walker    Past Surgical History:  Procedure Laterality Date  . BASCILIC VEIN TRANSPOSITION Right 10/19/2017   Procedure: Radiocephalic fistula creation;  Surgeon: Renford DillsSchnier, Gregory G, MD;  Location: [facility] ORS;  Service: Vascular;  Laterality: Right;  . CAPD INSERTION N/A 08/15/2017   Procedure: LAPAROSCOPIC  INSERTION CONTINUOUS AMBULATORY PERITONEAL DIALYSIS  (CAPD) CATHETER;  Surgeon: Renford DillsSchnier, Gregory G, MD;  Location: [facility] OR[facility];  Service: Vascular;  Laterality: N/A;  . CATARACT EXTRACTION W/ INTRAOCULAR LENS IMPLANT    . CATARACT EXTRACTION W/PHACO Right 12/25/2016   Procedure: CATARACT EXTRACTION PHACO AND INTRAOCULAR LENS PLACEMENT (IOC)  Right complicated;  Surgeon: Sherald HessAnita Prakash Vin-Parikh, MD;  [facility];  Service: Ophthalmology;  Laterality: Right;  Vision Blue  . DIALYSIS/PERMA CATHETER INSERTION N/A 08/20/2017   Procedure: DIALYSIS/PERMA CATHETER INSERTION;  Surgeon: Annice Needyew, Jason S, MD;  Location: [facility];  Service: Cardiovascular;  Laterality: N/A;  . DIALYSIS/PERMA CATHETER REMOVAL N/A 02/04/2018   Procedure: DIALYSIS/PERMA CATHETER REMOVAL;  Surgeon: Annice Needyew, Jason S, MD;  Location: [facility] IN[facility] CV [facility];  Service: Cardiovascular;  Laterality: N/A;  . REMOVAL OF A DIALYSIS CATHETER N/A 10/19/2017   Procedure: REMOVAL OF A DIALYSIS CATHETER;  Surgeon: Renford DillsSchnier, Gregory G, MD;  Location: [facility];  Service: Vascular;  Laterality: N/A;   Prior to Admission medications   Medication Sig Start Date End Date Taking? Authorizing Provider  aspirin 81 MG chewable tablet Chew 81 mg by mouth daily.   Yes [provider]  bisacodyl (BISACODYL) 5 MG EC tablet Take 2 tablets (10 mg total) by mouth daily as needed for moderate constipation. 02/13/18  Yes Enedina FinnerPatel, Sona, MD  Brinzolamide-Brimonidine 1-0.2 % SUSP Apply 1 drop to eye 3 (three) times daily.   Yes [provider]  diclofenac sodium (VOLTAREN) 1 % GEL Apply 2 g topically 4 (four) times daily. Apply to right shoulder   Yes [provider]  ethyl chloride spray Apply 1 application topically 3 (three) times a week. Tuesday, Thursday and Saturday   Yes [provider]  HYDROcodone-acetaminophen (NORCO) 5-325 MG tablet Take 1-2 tablets by mouth every 6 (six) hours as needed. 01/11/18  Yes Sainani,  Rolly Pancake, MD  latanoprost (XALATAN) 0.005 % ophthalmic solution Place 1 drop into both eyes at bedtime. 12/17/17  Yes [provider]  levothyroxine (SYNTHROID, LEVOTHROID) 25 MCG tablet Take 25 mcg by mouth daily before breakfast.   Yes [provider]  midodrine (PROAMATINE) 5 MG tablet Take 5 mg by mouth 3 (three) times a week. Give prior to dialysis on Tuesdays, Thursdays and Saturdays.   Yes [provider]  mirtazapine (REMERON) 7.5 MG tablet Take 1 tablet by mouth at bedtime. 12/11/17  Yes [provider]  multivitamin (RENA-VIT) TABS tablet Take 1 tablet by mouth at bedtime. 08/22/17  Yes Wieting, Richard, MD  polyethylene glycol (MIRALAX / GLYCOLAX) packet Take 17 g by mouth daily.   Yes [provider]  sodium chloride (MURO 128) 5 % ophthalmic ointment Place 1 application into the left eye 3 (three) times daily. 10/01/15  Yes [provider]  sodium chloride (MURO 128) 5 % ophthalmic solution Place 1 drop into the left eye 3 (three) times daily.   Yes [provider]  spironolactone (ALDACTONE) 25 MG tablet Take 1 tablet by mouth every Monday, Wednesday, and Friday. 11/30/17  Yes [provider]  brimonidine (ALPHAGAN) 0.2 % ophthalmic solution Place 1 drop into both eyes daily as needed (glaucoma). Patient not taking: Reported on 05-08-2018 08/22/17   Alford Highland, MD   Allergies  Allergen Reactions  . Penicillins Other (See Comments)    Has patient had a PCN reaction causing immediate rash, facial/tongue/throat swelling, SOB or lightheadedness with hypotension: Unknown Has patient had a PCN reaction causing severe rash involving mucus membranes or skin necrosis: Unknown Has patient had a PCN reaction that required hospitalization: Unknown Has patient had a PCN reaction occurring within the last 10 years: Unknown If all of the above answers are "NO", then may proceed with Cephalosporin use.      FAMILY HISTORY    Family History  Problem Relation Age of Onset  . Hypertension Mother   . CVA Mother   . CAD Father       SOCIAL HISTORY    reports that she quit smoking about 9 months ago. Her smoking use included cigarettes. She smoked 0.50 packs per day. She has quit using smokeless tobacco. Her smokeless tobacco use included snuff. She reports that she does not drink alcohol or use drugs.  Review of Systems  Unable to perform ROS: Acuity of condition      VITAL SIGNS    Temp:  [97.9 F (36.6 C)-98.6 F (37 C)] 98.2 F (36.8 C) (07/17 1151) Pulse Rate:  [95-116] 109 (07/17 1151) Resp:  [20-34] 22 (07/17 1151) BP: (140-196)/(78-104) 170/90 (07/17 1151) SpO2:  [39 %-100 %] 100 % (07/17 1151) Weight:  [49.4 kg (108 lb 14.5 oz)-56.7 kg (125 lb)] 50.3 kg (110 lb 14.3 oz) (07/17 1151)   INTAKE / OUTPUT: No intake or output data in the 24 hours ending 04/10/18 1205     PHYSICAL EXAM   Physical Exam  Constitutional: She appears ill. She appears distressed. She is not intubated.  HENT:  Head: Normocephalic and atraumatic.  Eyes: Pupils are equal, round, and reactive to light.  Cardiovascular: Normal rate and intact distal pulses. Exam reveals no gallop.  No murmur heard. tachycardic  Pulmonary/Chest: Accessory muscle usage present. Tachypnea noted. She is not intubated. She is in respiratory distress. She has decreased breath sounds.  Abdominal: Soft. Bowel sounds are normal. She exhibits no distension. There is no tenderness.  Musculoskeletal:       Right lower leg: She exhibits no edema.       Left lower leg: She exhibits no edema.  Lymphadenopathy:    She has no cervical adenopathy.  Skin: Skin is warm and dry.  Left forearm ulceration/skin breakdown       LABS   LABS:  CBC Recent Labs  Lab 2018-04-26 2125 04/10/18 0417  WBC 7.1 6.2  HGB 10.1* 11.0*  HCT 31.2* 33.6*  PLT 389 412   Coag's No results for input(s): APTT, INR in the last 168 hours. BMET Recent  Labs  Lab 04/10/18 0001 04/10/18 0417  NA 141 139  K 4.8 4.7  CL 102 101  CO2 29 27  BUN 71* 73*  CREATININE 4.09* 4.18*  GLUCOSE 113* 97   Electrolytes Recent Labs  Lab 04/10/18 0001 04/10/18 0417  CALCIUM 8.6* 8.4*   Sepsis Markers No results for input(s): LATICACIDVEN, PROCALCITON, O2SATVEN in the last 168 hours. ABG No results for input(s): PHART, PCO2ART, PO2ART in the last 168 hours. Liver Enzymes Recent Labs  Lab 04/10/18 0001  AST 30  ALT 21  ALKPHOS 118  BILITOT 0.7  ALBUMIN 2.9*   Cardiac Enzymes Recent Labs  Lab 04/10/18 0001  TROPONINI <0.03   Glucose Recent Labs  Lab 04/10/18 1135  GLUCAP 80     Recent Results (from the past 240 hour(s))  MRSA PCR Screening     Status: None   Collection Time: 04/10/18  3:02 AM  Result Value Ref Range Status   MRSA by PCR NEGATIVE NEGATIVE Final    Comment:        The GeneXpert MRSA Assay (FDA approved for NASAL specimens only), is one component of a comprehensive MRSA colonization surveillance program. It is not intended to diagnose MRSA infection nor to guide or monitor treatment for MRSA infections. Performed at [facility], [address] [place], [place] [address]      Current Facility-Administered Medications:  .  dextrose 50 % solution, , , ,  .  acetaminophen (TYLENOL) tablet 650 mg, 650 mg, Oral, Q6H PRN **OR** acetaminophen (TYLENOL) suppository 650 mg, 650 mg, Rectal, Q6H PRN, Oralia Manis, MD .  aspirin chewable tablet 81 mg, 81 mg, Oral, Daily, Oralia Manis, MD .  dextrose 50 % solution 50 mL, 1 ampule, Intravenous, Once, Roseanne Reno, MD .  heparin injection 5,000 Units, 5,000 Units, Subcutaneous, Q8H, Oralia Manis, MD, 5,000 Units at 04/10/18 0520 .  latanoprost (XALATAN) 0.005 % ophthalmic solution 1 drop, 1 drop, Both Eyes, QHS, Oralia Manis, MD .  levothyroxine (SYNTHROID, LEVOTHROID) tablet 25 mcg, 25 mcg, Oral, QAC breakfast, Oralia Manis, MD .  mirtazapine  (REMERON) tablet 7.5 mg, 7.5 mg, Oral, QHS, Oralia Manis, MD .  ondansetron [facility]) tablet 4 mg, 4 mg, Oral, Q6H PRN **OR** ondansetron (ZOFRAN) injection 4 mg, 4 mg, Intravenous, Q6H PRN, Oralia Manis, MD  IMAGING    Ct Abdomen Pelvis Wo Contrast  Result Date: 04/26/2018 CLINICAL DATA:  Patient refused dialysis today and presents with abdominal pain and tachycardia. EXAM: CT  CHEST, ABDOMEN AND PELVIS WITHOUT CONTRAST TECHNIQUE: Multidetector CT imaging of the chest, abdomen and pelvis was performed following the standard protocol without IV contrast. COMPARISON:  CXR 03/30/2018 FINDINGS: CT CHEST FINDINGS Cardiovascular: Cardiomegaly with three-vessel coronary arteriosclerosis. Trace pericardial effusion. Minimal aortic atherosclerosis without aneurysm. Mild dilatation of the main pulmonary artery to 3 cm may reflect a component of chronic pulmonary hypertension. Mediastinum/Nodes: No thyromegaly. Midline patent trachea and mainstem bronchi. No adenopathy. Esophagus is unremarkable. Lungs/Pleura: Moderate left and moderate to large right pleural effusions with adjacent compressive atelectasis. Bronchiectasis to both lower lobes. Subpleural atelectasis in the lingula left lower lobe. Musculoskeletal: Thoracic spondylosis. No aggressive osseous lesions. CT ABDOMEN PELVIS FINDINGS Hepatobiliary: Markedly distended gallbladder with punctate dependent calculus noted. No wall thickening or pericholecystic fluid. The unenhanced liver is unremarkable. Portal vein is difficult to assess given lack of IV contrast there is some heterogeneity seen within. Pancreas: Atrophic without acute abnormality. Spleen: Normal Adrenals/Urinary Tract: Atrophic kidneys. No nephrolithiasis nor hydroureteronephrosis. The urinary bladder demonstrates tiny layering calculi along the left posterior wall. Stomach/Bowel: Stomach is within normal limits. Appendix appears normal. No evidence of bowel wall thickening, distention, or  inflammatory changes. Vascular/Lymphatic: Nonaneurysmal moderate atherosclerosis of the aorta and branch vessels. No lymphadenopathy. Reproductive: Uterus and bilateral adnexa are unremarkable. Other: Diffuse soft tissue anasarca. Phleboliths and/or injection granulomata are identified overlying the buttocks. Musculoskeletal: No acute osseous abnormality. Lumbar spondylosis with degenerative disc disease L4-5 and L5-S1 with multilevel facet arthropathy. IMPRESSION: Chest CT: 1. Cardiomegaly with 3 vessel coronary arteriosclerosis. 2. Bilateral pleural effusions with compressive atelectasis, moderate on the left and moderate to large on the right. 3. Slight dilatation of the main pulmonary artery to 3 cm compatible with chronic pulmonary hypertension. 4. Thoracic spondylosis. CT AP: 1. Marked distention of the gallbladder without pericholecystic fluid or wall thickening. Punctate dependent calculus is identified within. Findings may represent a hydropic gallbladder or a fasting state of the gallbladder counting for the distention. 2. Atrophic kidneys without obstructive uropathy. Findings would be in keeping with the patient's dialysis dependence. 3. Diffuse soft tissue anasarca. 4. Degenerative disc disease L4-5 and L5-S1. Electronically Signed   By: Tollie Eth M.D.   On: 04/23/2018 23:20   Dg Chest 1 View  Result Date: 04/10/2018 CLINICAL DATA:  Post right-sided thoracentesis. Known right-sided pneumothorax. EXAM: CHEST  1 VIEW COMPARISON:  Chest radiograph-03/31/2018; 02/12/2018; chest CT-04/17/2018 FINDINGS: Grossly unchanged enlarged cardiac silhouette and mediastinal contours. Interval reduction/resolution of right-sided pleural effusion post thoracentesis. Minimally improved aeration of the right lung base with persistent right basilar hydropneumothorax. No mediastinal shift. Minimally improved aeration the right lower lung with persistent heterogeneous/consolidative opacities. Grossly unchanged small  left-sided effusion with associated left basilar heterogeneous/consolidative opacities. No new focal airspace opacities. Mild pulmonary venous congestion. No acute osseus abnormalities. IMPRESSION: 1. Interval reduction/resolution of right-sided effusion post thoracentesis with residual right basilar pneumothorax. 2. Unchanged small left-sided effusion with associated left basilar heterogeneous/consolidative opacities, atelectasis versus infiltrate. 3. Minimally improved aeration of the right lung base. Critical Value/emergent results were called by telephone at the time of interpretation on 04/10/2018 at 11:09 am to Dr. Allena Katz, who verbally acknowledged these results. Electronically Signed   By: Simonne Come M.D.   On: 04/10/2018 11:08   Ct Chest Wo Contrast  Result Date: 04/21/2018 CLINICAL DATA:  Patient refused dialysis today and presents with abdominal pain and tachycardia. EXAM: CT CHEST, ABDOMEN AND PELVIS WITHOUT CONTRAST TECHNIQUE: Multidetector CT imaging of the chest, abdomen and pelvis was performed following the  standard protocol without IV contrast. COMPARISON:  CXR 03/25/2018 FINDINGS: CT CHEST FINDINGS Cardiovascular: Cardiomegaly with three-vessel coronary arteriosclerosis. Trace pericardial effusion. Minimal aortic atherosclerosis without aneurysm. Mild dilatation of the main pulmonary artery to 3 cm may reflect a component of chronic pulmonary hypertension. Mediastinum/Nodes: No thyromegaly. Midline patent trachea and mainstem bronchi. No adenopathy. Esophagus is unremarkable. Lungs/Pleura: Moderate left and moderate to large right pleural effusions with adjacent compressive atelectasis. Bronchiectasis to both lower lobes. Subpleural atelectasis in the lingula left lower lobe. Musculoskeletal: Thoracic spondylosis. No aggressive osseous lesions. CT ABDOMEN PELVIS FINDINGS Hepatobiliary: Markedly distended gallbladder with punctate dependent calculus noted. No wall thickening or pericholecystic  fluid. The unenhanced liver is unremarkable. Portal vein is difficult to assess given lack of IV contrast there is some heterogeneity seen within. Pancreas: Atrophic without acute abnormality. Spleen: Normal Adrenals/Urinary Tract: Atrophic kidneys. No nephrolithiasis nor hydroureteronephrosis. The urinary bladder demonstrates tiny layering calculi along the left posterior wall. Stomach/Bowel: Stomach is within normal limits. Appendix appears normal. No evidence of bowel wall thickening, distention, or inflammatory changes. Vascular/Lymphatic: Nonaneurysmal moderate atherosclerosis of the aorta and branch vessels. No lymphadenopathy. Reproductive: Uterus and bilateral adnexa are unremarkable. Other: Diffuse soft tissue anasarca. Phleboliths and/or injection granulomata are identified overlying the buttocks. Musculoskeletal: No acute osseous abnormality. Lumbar spondylosis with degenerative disc disease L4-5 and L5-S1 with multilevel facet arthropathy. IMPRESSION: Chest CT: 1. Cardiomegaly with 3 vessel coronary arteriosclerosis. 2. Bilateral pleural effusions with compressive atelectasis, moderate on the left and moderate to large on the right. 3. Slight dilatation of the main pulmonary artery to 3 cm compatible with chronic pulmonary hypertension. 4. Thoracic spondylosis. CT AP: 1. Marked distention of the gallbladder without pericholecystic fluid or wall thickening. Punctate dependent calculus is identified within. Findings may represent a hydropic gallbladder or a fasting state of the gallbladder counting for the distention. 2. Atrophic kidneys without obstructive uropathy. Findings would be in keeping with the patient's dialysis dependence. 3. Diffuse soft tissue anasarca. 4. Degenerative disc disease L4-5 and L5-S1. Electronically Signed   By: Tollie Eth M.D.   On: 03/27/2018 23:20   Dg Chest Portable 1 View  Result Date: 04/08/2018 CLINICAL DATA:  Shortness of breath. Refused dialysis today. History of  CHF. EXAM: PORTABLE CHEST 1 VIEW COMPARISON:  Chest radiograph Feb 12, 2018 FINDINGS: Stable cardiomegaly. Pulmonary vascular congestion. Similar moderate to large bilateral pleural effusions. Biapical pleural capping. Prominent pleural reflection mid lung zone. No mediastinal shift. Soft tissue planes and included osseous structures are nonacute. High riding bilateral humeral heads seen with old rotator cuff injuries. IMPRESSION: 1. Moderate to large pleural effusions, possible RIGHT hydropneumothorax without mediastinal shift. Similar underlying consolidation. 2. Stable cardiomegaly and pulmonary vascular congestion. 3. Acute findings discussed with and reconfirmed by Dr.GRAYDON GOODMAN on 03/26/2018 at 9:49 pm. Electronically Signed   By: Awilda Metro M.D.   On: 03/31/2018 21:49   US Thoracentesis Asp Pleural Space W/img Guide  Result Date: 04/10/2018 INDICATION: Symptomatic right sided pleural effusion. Note, patient has known right basilar hydropneumothorax demonstrated on preceding chest radiograph and chest CT. EXAM: US THORACENTESIS ASP PLEURAL SPACE W/IMG GUIDE COMPARISON:  Chest radiograph-04/24/2018; chest CT-04/03/2018; MEDICATIONS: None. COMPLICATIONS: SIR LEVEL B - Normal therapy, includes overnight admission for observation. Patient experienced a sudden transient decreased oxygen saturation to the level of 40s necessitating calling of a rapid response, however her oxygen saturation levels normalized to the mid 90s with the administration of supplement total oxygen and postprocedural chest radiograph demonstrated resolution of the right-sided effusion with  no change in the size of the right basilar pneumothorax and no additional intervention was required. TECHNIQUE: Informed written consent was obtained from the patient after a discussion of the risks, benefits and alternatives to treatment. A timeout was performed prior to the initiation of the procedure. With the patient positioned left  lateral decubitus, initial ultrasound scanning demonstrates a moderate right-sided anechoic pleural effusion. The lower chest was prepped and draped in the usual sterile fashion. 1% lidocaine was used for local anesthesia. An ultrasound image was saved for documentation purposes. An 8 Fr Safe-T-Centesis catheter was introduced. The thoracentesis was performed. The catheter was removed and a dressing was applied. Patient experienced a sudden transient decreased oxygen saturation to the level of 40s necessitating calling of a rapid response, however her oxygen saturation levels normalized to the mid 90s with the administration of supplement total oxygen. The patient had a portable upright chest radiograph which demonstrated resolution of the right-sided effusion with no change in the size of the right basilar pneumothorax and no additional intervention was required. FINDINGS: A total of approximately 400 cc of blood-tinged fluid was removed. IMPRESSION: Successful ultrasound-guided right sided thoracentesis yielding 400 cc of blood tinged pleural fluid. Patient experienced a sudden transient decreased oxygen saturation to the level of 40s necessitating calling of a rapid response, however her oxygen saturation levels normalized to the mid 90s with the administration of supplement total oxygen and postprocedural chest radiograph demonstrated resolution of the right-sided effusion with no change in the size of the right basilar pneumothorax and no additional intervention was required. Electronically Signed   By: Simonne Come M.D.   On: 04/10/2018 11:20     INDWELLING DEVICES:: ET tube 7.5, 23 cm at lip Right subclavian central line Pigtail catheter chest tube on suction: right side  MICRO DATA: MRSA PCR: negative  Blood: drawn 7/17 Pleural fluid: LDH 262, protein 3.4, glucose 63  -sent for culture   ASSESSMENT/PLAN   77 year old female with bilateral pleural effusion, ESRD, CHF, and hypothyroidism who  presented to the ED last night (7/16) for evaluation of SOB. Patient noted to have bilateral pleural effusion in ED and admitted to medicine service. Patient with thoracentesis this morning (7/17) during which she had an unresponsive episode with desat into the 40s. Upon transfer to ICU patient intubated. After intubation, patient's blood pressure 45/34. Right central line placed and Levophed started.   PULMONARY A: Bilateral pleural effusions with right pneumothorax with hypercapnic respiratory failure P:  Patient intubated and sedated. Ventilator arterial blood gas showed improvement pH (7.31) and Co2 (55).  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 Thoracentesis drew 400cc off, possibly causing a trapped lung due to chronic nature of effusion. Initial analysis indicates exudative effusion. Cultures pending - Hydropneumothroax: pigtail catheter placed and hooked up to suction.   CARDIOVASCULAR A: Hypotension P: Levophed started with central line placement into the right subclavian with improvement of pressure to 95/55. -troponin negative -echo today 7/17  RENAL A:  ESRD P:  Nephrology has been consulted for dialysis management. Creatinine is 4.12 with BUN of 74.  GASTROINTESTINAL A:   Gallbladder abnormality noted on CT exam (7/17) P:  RUQ ultrasound ordered for further evaluation   INFECTIOUS A:  No concerns P:  Will start empiric abx  BCx2: drawn 7/17   ENDOCRINE A:  No concerns P:  Monitor blood sugar   NEUROLOGIC A:  Acute encephalopathy secondary to hypercapnic respiratory failure P:  Patient  intubated and sedated, likely related to hypercapnia RASS goal: -1      I have personally obtained a history, examined the patient, evaluated laboratory and independently reviewed  imaging results, formulated the assessment and plan and placed orders.  The Patient requires high complexity decision making for assessment and support,  frequent evaluation and titration of therapies, application of advanced monitoring technologies and extensive interpretation of multiple databases. Critical Care Time devoted to patient care services described in this note is 65 minutes.   Overall, patient is critically ill, prognosis is guarded. Patient at high risk for cardiac arrest and death.

## 2018-04-10 NOTE — Progress Notes (Signed)
[facility]  ROUNDING NOTE   Subjective:   [name] admitted to [facility] on 04/07/2018 for Shortness of breath [R06.02] Chronic bilateral pleural effusions [J90] Acute respiratory failure ([place]) [J96.00]  Last hemodialysis was 7/11. Missed Saturday and Tuesday due to shortness of breath.   Patient with large thoracentesis this morning, however patient developed a pneumothorax and required mechanical ventilation and intubation.   Patient now requiring vasopressors.   Objective:  Vital signs in last 24 hours:  Temp:  [97.9 F (36.6 C)-98.6 F (37 C)] 98.2 F (36.8 C) (07/17 1151) Pulse Rate:  [78-122] 84 (07/17 1530) Resp:  [20-34] 28 (07/17 1530) BP: (49-196)/(33-104) 113/66 (07/17 1530) SpO2:  [39 %-100 %] 100 % (07/17 1530) FiO2 (%):  [30 %] 30 % (07/17 1505) Weight:  [49.4 kg (108 lb 14.5 oz)-56.7 kg (125 lb)] 50.3 kg (110 lb 14.3 oz) (07/17 1151)  Weight change:  Filed Weights   04/22/2018 2114 04/10/18 0308 04/10/18 1151  Weight: 56.7 kg (125 lb) 49.4 kg (108 lb 14.5 oz) 50.3 kg (110 lb 14.3 oz)    Intake/Output: No intake/output data recorded.   Intake/Output this shift:  No intake/output data recorded.  Physical Exam: General: Critically Ill  Head: ETT, OGT  Eyes: Anicteric, PERRL  Neck: supple  Lungs:  PRVC FiO2 30%  Heart: Regular rate and rhythm  Abdomen:  Soft, nontender  Extremities:  no peripheral edema.  Neurologic: Intubated, sedated  Skin: No lesions  Access: Right AVF    Basic Metabolic Panel: Recent Labs  Lab 04/10/18 0001 04/10/18 0417 04/10/18 1229  NA 141 139 138  K 4.8 4.7 4.7  CL 102 101 102  CO2 _0 GLUCOSE 113* 97 225*  BUN 71* 73* 74*  CREATININE 4.09* 4.18* 4.12*  CALCIUM 8.6* 8.4* 8.2*  MG  --   --  2.3    Liver Function Tests: Recent Labs  Lab 04/10/18 0001 04/10/18 1229  AST 30 33  ALT 21 22  ALKPHOS 118 120  BILITOT 0.7 0.7  PROT 6.6 6.8  ALBUMIN 2.9* 2.9*   No results for input(s):  LIPASE, AMYLASE in the last 168 hours. No results for input(s): AMMONIA in the last 168 hours.  CBC: Recent Labs  Lab 04/12/2018 2125 04/10/18 0417 04/10/18 1229  WBC 7.1 6.2 11.6*  NEUTROABS 4.8  --   --   HGB 10.1* 11.0* 10.2*  HCT 31.2* 33.6* 32.3*  MCV 86.5 87.2 88.8  PLT 389 412 413    Cardiac Enzymes: Recent Labs  Lab 04/10/18 0001 04/10/18 1229  TROPONINI <0.03 <0.03    BNP: Invalid input(s): POCBNP  CBG: Recent Labs  Lab 04/10/18 1135 04/10/18 1149 04/10/18 1610  GLUCAP 80 42 148*    Microbiology: Results for orders placed or performed during the hospital encounter of 04/14/2018  MRSA PCR Screening     Status: None   Collection Time: 04/10/18  3:02 AM  Result Value Ref Range Status   MRSA by PCR NEGATIVE NEGATIVE Final    Comment:        The GeneXpert MRSA Assay (FDA approved for NASAL specimens only), is one component of a comprehensive MRSA colonization surveillance program. It is not intended to diagnose MRSA infection nor to guide or monitor treatment for MRSA infections. Performed at [facility], [address] [place], [place] [address]     Coagulation Studies: No results for input(s): LABPROT, INR in the last 72 hours.  Urinalysis: No results for  input(s): COLORURINE, LABSPEC, PHURINE, GLUCOSEU, HGBUR, BILIRUBINUR, KETONESUR, PROTEINUR, UROBILINOGEN, NITRITE, LEUKOCYTESUR in the last 72 hours.  Invalid input(s): APPERANCEUR    Imaging: Ct Abdomen Pelvis Wo Contrast  Result Date: 04/17/2018 CLINICAL DATA:  Patient refused dialysis today and presents with abdominal pain and tachycardia. EXAM: CT CHEST, ABDOMEN AND PELVIS WITHOUT CONTRAST TECHNIQUE: Multidetector CT imaging of the chest, abdomen and pelvis was performed following the standard protocol without IV contrast. COMPARISON:  CXR 03/27/2018 FINDINGS: CT CHEST FINDINGS Cardiovascular: Cardiomegaly with three-vessel coronary arteriosclerosis. Trace pericardial effusion.  Minimal aortic atherosclerosis without aneurysm. Mild dilatation of the main pulmonary artery to 3 cm may reflect a component of chronic pulmonary hypertension. Mediastinum/Nodes: No thyromegaly. Midline patent trachea and mainstem bronchi. No adenopathy. Esophagus is unremarkable. Lungs/Pleura: Moderate left and moderate to large right pleural effusions with adjacent compressive atelectasis. Bronchiectasis to both lower lobes. Subpleural atelectasis in the lingula left lower lobe. Musculoskeletal: Thoracic spondylosis. No aggressive osseous lesions. CT ABDOMEN PELVIS FINDINGS Hepatobiliary: Markedly distended gallbladder with punctate dependent calculus noted. No wall thickening or pericholecystic fluid. The unenhanced liver is unremarkable. Portal vein is difficult to assess given lack of IV contrast there is some heterogeneity seen within. Pancreas: Atrophic without acute abnormality. Spleen: Normal Adrenals/Urinary Tract: Atrophic kidneys. No nephrolithiasis nor hydroureteronephrosis. The urinary bladder demonstrates tiny layering calculi along the left posterior wall. Stomach/Bowel: Stomach is within normal limits. Appendix appears normal. No evidence of bowel wall thickening, distention, or inflammatory changes. Vascular/Lymphatic: Nonaneurysmal moderate atherosclerosis of the aorta and branch vessels. No lymphadenopathy. Reproductive: Uterus and bilateral adnexa are unremarkable. Other: Diffuse soft tissue anasarca. Phleboliths and/or injection granulomata are identified overlying the buttocks. Musculoskeletal: No acute osseous abnormality. Lumbar spondylosis with degenerative disc disease L4-5 and L5-S1 with multilevel facet arthropathy. IMPRESSION: Chest CT: 1. Cardiomegaly with 3 vessel coronary arteriosclerosis. 2. Bilateral pleural effusions with compressive atelectasis, moderate on the left and moderate to large on the right. 3. Slight dilatation of the main pulmonary artery to 3 cm compatible with  chronic pulmonary hypertension. 4. Thoracic spondylosis. CT AP: 1. Marked distention of the gallbladder without pericholecystic fluid or wall thickening. Punctate dependent calculus is identified within. Findings may represent a hydropic gallbladder or a fasting state of the gallbladder counting for the distention. 2. Atrophic kidneys without obstructive uropathy. Findings would be in keeping with the patient's dialysis dependence. 3. Diffuse soft tissue anasarca. 4. Degenerative disc disease L4-5 and L5-S1. Electronically Signed   By: Ashley Royalty M.D.   On: 04/01/2018 23:20   Dg Chest 1 View  Result Date: 04/10/2018 CLINICAL DATA:  Post right-sided thoracentesis. Known right-sided pneumothorax. EXAM: CHEST  1 VIEW COMPARISON:  Chest radiograph-03/28/2018; 02/12/2018; chest CT-04/01/2018 FINDINGS: Grossly unchanged enlarged cardiac silhouette and mediastinal contours. Interval reduction/resolution of right-sided pleural effusion post thoracentesis. Minimally improved aeration of the right lung base with persistent right basilar hydropneumothorax. No mediastinal shift. Minimally improved aeration the right lower lung with persistent heterogeneous/consolidative opacities. Grossly unchanged small left-sided effusion with associated left basilar heterogeneous/consolidative opacities. No new focal airspace opacities. Mild pulmonary venous congestion. No acute osseus abnormalities. IMPRESSION: 1. Interval reduction/resolution of right-sided effusion post thoracentesis with residual right basilar pneumothorax. 2. Unchanged small left-sided effusion with associated left basilar heterogeneous/consolidative opacities, atelectasis versus infiltrate. 3. Minimally improved aeration of the right lung base. Critical Value/emergent results were called by telephone at the time of interpretation on 04/10/2018 at 11:09 am to Dr. Posey Pronto, who verbally acknowledged these results. Electronically Signed   By: Eldridge Abrahams.D.  On:  04/10/2018 11:08   Dg Abd 1 View  Result Date: 04/10/2018 CLINICAL DATA:  Orogastric tube placement EXAM: ABDOMEN - 1 VIEW COMPARISON:  None. FINDINGS: Orogastric tube tip and side port project over the stomach. Bilateral pleural effusions with associated atelectasis. Prominent small bowel in the central abdomen. IMPRESSION: Orogastric tube tip and side port projecting over the stomach. Electronically Signed   By: Ulyses Jarred M.D.   On: 04/10/2018 14:53   Ct Chest Wo Contrast  Result Date: 04/23/2018 CLINICAL DATA:  Patient refused dialysis today and presents with abdominal pain and tachycardia. EXAM: CT CHEST, ABDOMEN AND PELVIS WITHOUT CONTRAST TECHNIQUE: Multidetector CT imaging of the chest, abdomen and pelvis was performed following the standard protocol without IV contrast. COMPARISON:  CXR 04/12/2018 FINDINGS: CT CHEST FINDINGS Cardiovascular: Cardiomegaly with three-vessel coronary arteriosclerosis. Trace pericardial effusion. Minimal aortic atherosclerosis without aneurysm. Mild dilatation of the main pulmonary artery to 3 cm may reflect a component of chronic pulmonary hypertension. Mediastinum/Nodes: No thyromegaly. Midline patent trachea and mainstem bronchi. No adenopathy. Esophagus is unremarkable. Lungs/Pleura: Moderate left and moderate to large right pleural effusions with adjacent compressive atelectasis. Bronchiectasis to both lower lobes. Subpleural atelectasis in the lingula left lower lobe. Musculoskeletal: Thoracic spondylosis. No aggressive osseous lesions. CT ABDOMEN PELVIS FINDINGS Hepatobiliary: Markedly distended gallbladder with punctate dependent calculus noted. No wall thickening or pericholecystic fluid. The unenhanced liver is unremarkable. Portal vein is difficult to assess given lack of IV contrast there is some heterogeneity seen within. Pancreas: Atrophic without acute abnormality. Spleen: Normal Adrenals/Urinary Tract: Atrophic kidneys. No nephrolithiasis nor  hydroureteronephrosis. The urinary bladder demonstrates tiny layering calculi along the left posterior wall. Stomach/Bowel: Stomach is within normal limits. Appendix appears normal. No evidence of bowel wall thickening, distention, or inflammatory changes. Vascular/Lymphatic: Nonaneurysmal moderate atherosclerosis of the aorta and branch vessels. No lymphadenopathy. Reproductive: Uterus and bilateral adnexa are unremarkable. Other: Diffuse soft tissue anasarca. Phleboliths and/or injection granulomata are identified overlying the buttocks. Musculoskeletal: No acute osseous abnormality. Lumbar spondylosis with degenerative disc disease L4-5 and L5-S1 with multilevel facet arthropathy. IMPRESSION: Chest CT: 1. Cardiomegaly with 3 vessel coronary arteriosclerosis. 2. Bilateral pleural effusions with compressive atelectasis, moderate on the left and moderate to large on the right. 3. Slight dilatation of the main pulmonary artery to 3 cm compatible with chronic pulmonary hypertension. 4. Thoracic spondylosis. CT AP: 1. Marked distention of the gallbladder without pericholecystic fluid or wall thickening. Punctate dependent calculus is identified within. Findings may represent a hydropic gallbladder or a fasting state of the gallbladder counting for the distention. 2. Atrophic kidneys without obstructive uropathy. Findings would be in keeping with the patient's dialysis dependence. 3. Diffuse soft tissue anasarca. 4. Degenerative disc disease L4-5 and L5-S1. Electronically Signed   By: Ashley Royalty M.D.   On: 04/01/2018 23:20   Dg Chest Port 1 View  Result Date: 04/10/2018 CLINICAL DATA:  Post intubation and right-sided chest tube placement EXAM: PORTABLE CHEST 1 VIEW COMPARISON:  Earlier same day (multiple examinations) 04/06/2018; chest CT-04/09/2028 FINDINGS: Grossly unchanged cardiac silhouette and mediastinal contours with partial obscuration of the left heart border secondary to unchanged small left-sided  effusion associated left basilar heterogeneous/consolidative opacities. No new focal airspace opacities. Endotracheal tube overlies the tracheal air column with tip superior to the carina. Enteric tube tip and side port project below the left hemidiaphragm. Interval placement of right subclavian vein approach central venous catheter with tip projected over the superior cavoatrial junction. Interval placement of a right lateral chest tube  with reduction/resolution of right-sided effusion and unchanged small right basilar hydropneumothorax. No mediastinal shift. No acute osseus abnormalities. Degenerative change of the right glenohumeral joint is suspected though incompletely evaluated. IMPRESSION: 1. Interval placement of right-sided chest tube with reduction/resolution of right-sided effusion post thoracentesis in grossly unchanged small right basilar hydropneumothorax. 2. Right subclavian vein approach central venous catheter tip projects over the superior cavoatrial junction. 3. Stable position of remaining support apparatus. 4. Unchanged small left-sided effusion associated left basilar opacities, atelectasis versus infiltrate. No new focal airspace opacities. Electronically Signed   By: Sandi Mariscal M.D.   On: 04/10/2018 14:47   Portable Chest X-ray  Result Date: 04/10/2018 CLINICAL DATA:  77 year old female with a history of intubation. EXAM: PORTABLE CHEST 1 VIEW COMPARISON:  04/10/2018, 02/12/2018, chest CT 03/28/2018 FINDINGS: Cardiomediastinal silhouette unchanged in size and contour, with the heart borders partially obscured by overlying lung/pleural disease. Dense opacities at the bilateral lung bases, worst on the left. Small right-sided pneumothorax persists. Interval placement of endotracheal tube which terminates approximately 3.2 cm above the carina. Interval placement of gastric tube terminating in the left upper abdomen out of the field of view. IMPRESSION: Interval placement of endotracheal  tube, terminating above the carina approximately 3.2 cm. Interval placement of gastric tube. Similar appearance of right hydropneumothorax, unchanged from the prior. Dense opacity at the left base, likely a combination of atelectasis/consolidation, pleural fluid, and/or edema. Electronically Signed   By: Corrie Mckusick D.O.   On: 04/10/2018 13:23   Dg Chest Port 1 View  Result Date: 04/10/2018 CLINICAL DATA:  Post recent right-sided thoracentesis with persistent respiratory distress. EXAM: PORTABLE CHEST 1 VIEW COMPARISON:  Earlier same day; 04/18/2018; chest CT-04/05/2018 FINDINGS: Grossly unchanged enlarged cardiac silhouette and mediastinal contours. Grossly unchanged small loculated right basilar hydropneumothorax post recent thoracentesis. No midline shift. Unchanged small left-sided effusion with associated left basilar opacities. No new focal airspace opacities. Mild pulmonary venous congestion without frank evidence of edema. No acute osseus abnormalities. No acute osseus abnormalities. IMPRESSION: 1. Grossly unchanged small loculated right basilar hydropneumothorax post recent thoracentesis. 2. Unchanged small left-sided effusion associated left basilar opacities, atelectasis versus infiltrate. 3. Pulmonary venous congestion without frank evidence of edema. Electronically Signed   By: Sandi Mariscal M.D.   On: 04/10/2018 12:25   Dg Chest Portable 1 View  Result Date: 04/15/2018 CLINICAL DATA:  Shortness of breath. Refused dialysis today. History of CHF. EXAM: PORTABLE CHEST 1 VIEW COMPARISON:  Chest radiograph Feb 12, 2018 FINDINGS: Stable cardiomegaly. Pulmonary vascular congestion. Similar moderate to large bilateral pleural effusions. Biapical pleural capping. Prominent pleural reflection mid lung zone. No mediastinal shift. Soft tissue planes and included osseous structures are nonacute. High riding bilateral humeral heads seen with old rotator cuff injuries. IMPRESSION: 1. Moderate to large pleural  effusions, possible RIGHT hydropneumothorax without mediastinal shift. Similar underlying consolidation. 2. Stable cardiomegaly and pulmonary vascular congestion. 3. Acute findings discussed with and reconfirmed by Dr.GRAYDON GOODMAN on 04/21/2018 at 9:49 pm. Electronically Signed   By: Elon Alas M.D.   On: 03/25/2018 21:49   US Thoracentesis Asp Pleural Space W/img Guide  Result Date: 04/10/2018 INDICATION: Symptomatic right sided pleural effusion. Note, patient has known right basilar hydropneumothorax demonstrated on preceding chest radiograph and chest CT. EXAM: US THORACENTESIS ASP PLEURAL SPACE W/IMG GUIDE COMPARISON:  Chest radiograph-04/01/2018; chest CT-04/02/2018; MEDICATIONS: None. COMPLICATIONS: SIR LEVEL B - Normal therapy, includes overnight admission for observation. Patient experienced a sudden transient decreased oxygen saturation to the level of 40s necessitating  calling of a rapid response, however her oxygen saturation levels normalized to the mid 90s with the administration of supplement total oxygen and postprocedural chest radiograph demonstrated resolution of the right-sided effusion with no change in the size of the right basilar pneumothorax and no additional intervention was required. TECHNIQUE: Informed written consent was obtained from the patient after a discussion of the risks, benefits and alternatives to treatment. A timeout was performed prior to the initiation of the procedure. With the patient positioned left lateral decubitus, initial ultrasound scanning demonstrates a moderate right-sided anechoic pleural effusion. The lower chest was prepped and draped in the usual sterile fashion. 1% lidocaine was used for local anesthesia. An ultrasound image was saved for documentation purposes. An 8 Fr Safe-T-Centesis catheter was introduced. The thoracentesis was performed. The catheter was removed and a dressing was applied. Patient experienced a sudden transient decreased  oxygen saturation to the level of 40s necessitating calling of a rapid response, however her oxygen saturation levels normalized to the mid 90s with the administration of supplement total oxygen. The patient had a portable upright chest radiograph which demonstrated resolution of the right-sided effusion with no change in the size of the right basilar pneumothorax and no additional intervention was required. FINDINGS: A total of approximately 400 cc of blood-tinged fluid was removed. IMPRESSION: Successful ultrasound-guided right sided thoracentesis yielding 400 cc of blood tinged pleural fluid. Patient experienced a sudden transient decreased oxygen saturation to the level of 40s necessitating calling of a rapid response, however her oxygen saturation levels normalized to the mid 90s with the administration of supplement total oxygen and postprocedural chest radiograph demonstrated resolution of the right-sided effusion with no change in the size of the right basilar pneumothorax and no additional intervention was required. Electronically Signed   By: Sandi Mariscal M.D.   On: 04/10/2018 11:20     Medications:   . dexmedetomidine (PRECEDEX) IV infusion 1 mcg/kg/hr (04/10/18 1430)  . dextrose 5% lactated ringers    . famotidine (PEPCID) IV    . norepinephrine (LEVOPHED) Adult infusion 5 mcg/min (04/10/18 1335)  . vancomycin    . [START ON 04/11/2018] vancomycin     . aspirin  81 mg Oral Daily  . chlorhexidine gluconate (MEDLINE KIT)  15 mL Mouth Rinse BID  . heparin  5,000 Units Subcutaneous Q8H  . ipratropium-albuterol  3 mL Nebulization Q6H  . latanoprost  1 drop Both Eyes QHS  . levothyroxine  25 mcg Oral QAC breakfast  . mouth rinse  15 mL Mouth Rinse 10 times per day  . mirtazapine  7.5 mg Oral QHS  . sodium chloride flush  10-40 mL Intracatheter Q12H   acetaminophen **OR** acetaminophen, docusate, fentaNYL (SUBLIMAZE) injection, fentaNYL (SUBLIMAZE) injection, ondansetron **OR** ondansetron  (ZOFRAN) IV, sodium chloride flush  Assessment/ Plan:  [name] is a 77 y.o. black female with end stage renal disease on hemodialysis TTS, hypertension, tobacco abuse, glaucoma with blindness, hypothyroidism, congestive heart failure who is admitted to [facility] on 04/14/2018.   Patient with large thoracentesis this morning, however patient developed a pneumothorax and required mechanical ventilation and intubation.   [facility] TTS [facility] EDW 51.5kg Right AVF  1. End Stage Renal Disease: missed yesterday's hemodialysis treatment. Last hemodialysis treatment was Thursday, 7/11.  No indication for dialysis - tentative dialysis scheduled for tomorrow.   2. Hypotension: midodrine before hemodialysis treatment. Currently requiring vasopressors.   3. Anemia of chronic kidney disease: hemoglobin 10.2  - EPO as outpatient  4.  Secondary Hyperparathyroidism: Labs from 7/9: PTH 219, calcium 8.2 phosphorus 3.8 - at goal.   Will consult palliative care.    LOS: 0 Liev Brockbank 7/17/20194:12 PM

## 2018-04-10 NOTE — Significant Event (Signed)
Rapid Response Event Note  Overview: Time Called: 1137 Arrival Time: 1140 Event Type: Respiratory  Initial Focused Assessment: called for rapid response for continued resp distress.   Interventions: Tx pt to ICU, ABG, and placed on bipap. Dr Sherryll BurgerShah in to assess pt and assume care. See flowsheets for details.  Plan of Care (if not transferred):  Event Summary: Name of Physician Notified: Dr Sherryll BurgerShah (intensivist) at 1145    at    Outcome: Transferred (Comment)(ICU 4)  Event End Time: 1150  Ann Cunningham A

## 2018-04-11 ENCOUNTER — Inpatient Hospital Stay: Payer: Medicare Other

## 2018-04-11 DIAGNOSIS — R0602 Shortness of breath: Secondary | ICD-10-CM

## 2018-04-11 LAB — BASIC METABOLIC PANEL
ANION GAP: 10 (ref 5–15)
BUN: 77 mg/dL — ABNORMAL HIGH (ref 8–23)
CALCIUM: 8.5 mg/dL — AB (ref 8.9–10.3)
CHLORIDE: 105 mmol/L (ref 98–111)
CO2: 27 mmol/L (ref 22–32)
Creatinine, Ser: 4.21 mg/dL — ABNORMAL HIGH (ref 0.44–1.00)
GFR calc Af Amer: 11 mL/min — ABNORMAL LOW (ref >60)
GFR calc non Af Amer: 9 mL/min — ABNORMAL LOW (ref >60)
GLUCOSE: 143 mg/dL — AB (ref 70–99)
Potassium: 4.9 mmol/L (ref 3.5–5.1)
Sodium: 142 mmol/L (ref 135–145)

## 2018-04-11 LAB — BLOOD GAS, ARTERIAL
ACID-BASE EXCESS: 0.2 mmol/L (ref 0.0–2.0)
BICARBONATE: 26.8 mmol/L (ref 20.0–28.0)
FIO2: 0.3
Mechanical Rate: 16
O2 Saturation: 96.9 %
PATIENT TEMPERATURE: 37
PEEP/CPAP: 5 cmH2O
PH ART: 7.32 — AB (ref 7.350–7.450)
PO2 ART: 97 mmHg (ref 83.0–108.0)
VT: 400 mL
pCO2 arterial: 52 mmHg — ABNORMAL HIGH (ref 32.0–48.0)

## 2018-04-11 LAB — HEPATIC FUNCTION PANEL
ALK PHOS: 105 U/L (ref 38–126)
ALT: 19 U/L (ref 0–44)
AST: 31 U/L (ref 15–41)
Albumin: 2.8 g/dL — ABNORMAL LOW (ref 3.5–5.0)
BILIRUBIN DIRECT: 0.1 mg/dL (ref 0.0–0.2)
BILIRUBIN INDIRECT: 0.6 mg/dL (ref 0.3–0.9)
BILIRUBIN TOTAL: 0.7 mg/dL (ref 0.3–1.2)
TOTAL PROTEIN: 6.5 g/dL (ref 6.5–8.1)

## 2018-04-11 LAB — CBC WITH DIFFERENTIAL/PLATELET
Basophils Absolute: 0.1 10*3/uL (ref 0–0.1)
Basophils Relative: 1 %
EOS ABS: 0 10*3/uL (ref 0–0.7)
Eosinophils Relative: 0 %
HEMATOCRIT: 29.4 % — AB (ref 35.0–47.0)
HEMOGLOBIN: 9.6 g/dL — AB (ref 12.0–16.0)
LYMPHS ABS: 0.3 10*3/uL — AB (ref 1.0–3.6)
LYMPHS PCT: 3 %
MCH: 28.6 pg (ref 26.0–34.0)
MCHC: 32.8 g/dL (ref 32.0–36.0)
MCV: 87.3 fL (ref 80.0–100.0)
MONOS PCT: 7 %
Monocytes Absolute: 0.7 10*3/uL (ref 0.2–0.9)
Neutro Abs: 8.9 10*3/uL — ABNORMAL HIGH (ref 1.4–6.5)
Neutrophils Relative %: 89 %
Platelets: 355 10*3/uL (ref 150–440)
RBC: 3.36 MIL/uL — ABNORMAL LOW (ref 3.80–5.20)
RDW: 17.1 % — ABNORMAL HIGH (ref 11.5–14.5)
WBC: 10 10*3/uL (ref 3.6–11.0)

## 2018-04-11 LAB — PHOSPHORUS: Phosphorus: 5.4 mg/dL — ABNORMAL HIGH (ref 2.5–4.6)

## 2018-04-11 LAB — GLUCOSE, CAPILLARY
GLUCOSE-CAPILLARY: 115 mg/dL — AB (ref 70–99)
GLUCOSE-CAPILLARY: 124 mg/dL — AB (ref 70–99)
GLUCOSE-CAPILLARY: 162 mg/dL — AB (ref 70–99)
Glucose-Capillary: 118 mg/dL — ABNORMAL HIGH (ref 70–99)
Glucose-Capillary: 133 mg/dL — ABNORMAL HIGH (ref 70–99)
Glucose-Capillary: 88 mg/dL (ref 70–99)

## 2018-04-11 LAB — TROPONIN I: Troponin I: 0.07 ng/mL (ref <0.03)

## 2018-04-11 LAB — PROTIME-INR
INR: 1.13
Prothrombin Time: 14.4 seconds (ref 11.4–15.2)

## 2018-04-11 LAB — LIPASE, BLOOD: LIPASE: 29 U/L (ref 11–51)

## 2018-04-11 LAB — BRAIN NATRIURETIC PEPTIDE: B Natriuretic Peptide: 543 pg/mL — ABNORMAL HIGH (ref 0.0–100.0)

## 2018-04-11 LAB — MAGNESIUM: Magnesium: 2.3 mg/dL (ref 1.7–2.4)

## 2018-04-11 LAB — AMMONIA: Ammonia: 15 umol/L (ref 9–35)

## 2018-04-11 MED ORDER — VITAL HIGH PROTEIN PO LIQD: 1000.0000 mL | ORAL | Status: DC

## 2018-04-11 MED ORDER — EPOETIN ALFA 10000 UNIT/ML IJ SOLN
10000.0000 [IU] | INTRAMUSCULAR | Status: DC
  Administered 2018-04-11 – 2018-04-16 (×3): 10000 [IU] via INTRAVENOUS
  Filled 2018-04-11 (×3): qty 1

## 2018-04-11 MED ORDER — LEVOTHYROXINE SODIUM 100 MCG IV SOLR
25.0000 ug | Freq: Every day | INTRAVENOUS | Status: DC
  Administered 2018-04-11 – 2018-04-19 (×9): 25 ug via INTRAVENOUS
  Filled 2018-04-11 (×9): qty 5

## 2018-04-11 MED ORDER — LEVOFLOXACIN IN D5W 500 MG/100ML IV SOLN
500.0000 mg | Freq: Every day | INTRAVENOUS | Status: DC
  Administered 2018-04-11: 500 mg via INTRAVENOUS
  Filled 2018-04-11 (×2): qty 100

## 2018-04-11 MED ORDER — FAMOTIDINE 20 MG PO TABS
20.0000 mg | ORAL_TABLET | Freq: Every day | ORAL | Status: DC
  Administered 2018-04-11 – 2018-04-19 (×9): 20 mg
  Filled 2018-04-11 (×9): qty 1

## 2018-04-11 MED ORDER — VITAL AF 1.2 CAL PO LIQD
1000.0000 mL | ORAL | Status: DC
  Administered 2018-04-11 – 2018-04-17 (×7): 1000 mL

## 2018-04-11 MED ORDER — CHLORHEXIDINE GLUCONATE CLOTH 2 % EX PADS
6.0000 | MEDICATED_PAD | Freq: Every day | CUTANEOUS | Status: DC
  Administered 2018-04-12: 6 via TOPICAL

## 2018-04-11 MED ORDER — PENTAFLUOROPROP-TETRAFLUOROETH EX AERO
INHALATION_SPRAY | CUTANEOUS | Status: DC | PRN
  Filled 2018-04-11 (×2): qty 103.5

## 2018-04-11 MED ORDER — CHLORHEXIDINE GLUCONATE CLOTH 2 % EX PADS: 6.0000 | MEDICATED_PAD | Freq: Every day | CUTANEOUS | Status: DC

## 2018-04-11 MED ORDER — B COMPLEX-C PO TABS
1.0000 | ORAL_TABLET | Freq: Every day | ORAL | Status: DC
  Administered 2018-04-11 – 2018-04-16 (×6): 1
  Filled 2018-04-11 (×6): qty 1

## 2018-04-11 NOTE — Progress Notes (Signed)
Post Hd Tx, tolerated well, lost 13 min due to clotting system.    04/11/18 1345  Hand-Off documentation  Report given to (Full Name) Alberteen SpindleAshly Gunter, RN   Report received from (Full Name) Laretta Alstromoya Shalev Helminiak, RN   Vital Signs  Temp 97.9 F (36.6 C)  Pulse Rate (!) 108  Resp (!) 23  BP 103/62  BP Location Left Arm  BP Method Automatic  Patient Position (if appropriate) Lying  Oxygen Therapy  SpO2 99 %  O2 Device Ventilator  End Tidal CO2 (EtCO2) 43  Post-Hemodialysis Assessment  Rinseback Volume (mL) 250 mL  KECN 25.3 V  Dialyzer Clearance Heavily streaked  Duration of HD Treatment -hour(s) 2.9 hour(s)  Hemodialysis Intake (mL) 1000 mL  UF Total -Machine (mL) 489 mL  Net UF (mL) -511 mL  Tolerated HD Treatment Yes  AVG/AVF Arterial Site Held (minutes) 10 minutes  AVG/AVF Venous Site Held (minutes) 10 minutes  Fistula / Graft Right Forearm Arteriovenous fistula  Placement Date/Time: 10/19/17 0850   Placed prior to admission: No  Orientation: Right  Access Location: Forearm  Access Type: Arteriovenous fistula  Site Condition No complications  Fistula / Graft Assessment Present;Thrill;Bruit  Status Deaccessed  Drainage Description None

## 2018-04-11 NOTE — Progress Notes (Signed)
We tried to do SBT after dialysis, patient was having shallow breaths and not able to tolerate SBT - We will plan SBT in a.m. with sedation vacation - Plan of care discussed with nursing at length.  Micharl Helmes Sherryll BurgerShah Pul[facility] Medicine

## 2018-04-11 NOTE — Progress Notes (Signed)
B/P recovered after intervention (medication titration by primary RN)

## 2018-04-11 NOTE — Progress Notes (Signed)
Post HD Assessment, pt seems to be moving around more, nodding in response to questions, seems more alert.    04/11/18 1341  Neurological  Level of Consciousness Responds to Voice  Orientation Level Intubated/Tracheostomy - Unable to assess  Respiratory  Respiratory Pattern Regular  Chest Assessment Chest expansion symmetrical  Bilateral Breath Sounds Clear  Cough Congested  Cardiac  Pulse Regular  Heart Sounds S1, S2  ECG Monitor Yes  Vascular  R Radial Pulse +2  L Radial Pulse +2  Psychosocial  Psychosocial (WDL) WDL

## 2018-04-11 NOTE — Progress Notes (Signed)
HD TX started    04/11/18 1045  Vital Signs  Temp 98.1 F (36.7 C)  Pulse Rate 96  Resp 19  BP 112/67  Oxygen Therapy  SpO2 98 %  End Tidal CO2 (EtCO2) 41  During Hemodialysis Assessment  Blood Flow Rate (mL/min) 300 mL/min  Arterial Pressure (mmHg) -200 mmHg  Venous Pressure (mmHg) 140 mmHg  Transmembrane Pressure (mmHg) 60 mmHg  Ultrafiltration Rate (mL/min) 200 mL/min  Dialysate Flow Rate (mL/min) 600 ml/min  Conductivity: Machine  13.9  HD Safety Checks Performed Yes  Dialysis Fluid Bolus Normal Saline  Bolus Amount (mL) 250 mL  Intra-Hemodialysis Comments Tx initiated  Fistula / Graft Right Forearm Arteriovenous fistula  Placement Date/Time: 10/19/17 0850   Placed prior to admission: No  Orientation: Right  Access Location: Forearm  Access Type: Arteriovenous fistula  Status Accessed  Needle Size 17g (MD aware of change )  Drainage Description None

## 2018-04-11 NOTE — Progress Notes (Signed)
HD Tx stopped due to excessive high venous pressures and clotting in system    04/11/18 1337  Vital Signs  Temp 97.9 F (36.6 C)  Resp (!) 26  BP Location Left Arm  BP Method Automatic  Patient Position (if appropriate) Lying  Oxygen Therapy  End Tidal CO2 (EtCO2) 42  During Hemodialysis Assessment  HD Safety Checks Performed Yes  KECN 25.3 KECN  Dialysis Fluid Bolus Normal Saline  Bolus Amount (mL) 250 mL  Intra-Hemodialysis Comments Tx completed;Tolerated well

## 2018-04-11 NOTE — Progress Notes (Signed)
CRITICAL CARE NOTE  CC  follow up hypercarbic respiratory failure secondary to pneumothorax  SUBJECTIVE Patient remains critically ill Prognosis is guarded  77 year old female with ESRD, CHF, and bilateral pleural effusions. Patient was admitted yesterday (7/17). Transferred to the ICU s/p right thoracentesis for drainage of chronic pleural effusion. Patient developed respiratory distress and was intubated in the ICU.   SIGNIFICANT EVENTS None overnight.   BP 109/60   Pulse 90   Temp 98.4 F (36.9 C)   Resp 16   Ht 5' 2"  (1.575 m)   Wt 50.2 kg (110 lb 10.7 oz)   SpO2 96%   BMI 20.24 kg/m    REVIEW OF SYSTEMS  PATIENT IS UNABLE TO PROVIDE COMPLETE REVIEW OF SYSTEM S DUE TO SEVERE CRITICAL ILLNESS AND ENCEPHALOPATHY   PHYSICAL EXAMINATION:  GENERAL:critically ill appearing, -resp distress HEAD: Normocephalic, atraumatic.  EYES: Pupils equal, round, reactive to light.  No scleral icterus.  MOUTH: Moist mucosal membrane. NECK: Supple. No thyromegaly. No nodules. No JVD.  PULMONARY: +rhonchi, +wheezing CARDIOVASCULAR: S1 and S2. Regular rate and rhythm. No murmurs, rubs, or gallops.  GASTROINTESTINAL: Soft, nontender, -distended. No masses. Positive bowel sounds. No hepatosplenomegaly.  MUSCULOSKELETAL: No swelling, clubbing, or edema.  NEUROLOGIC: obtunded, GCS<8 SKIN:ulceration on left forearm, skin breakdown on left forearm  ASSESSMENT AND PLAN SYNOPSIS  77 year old female with hypercarbic respiratory failure secondary to pneumothorax. History of ESRD on dialysis, hypothyroidism and anemia. Patient doing well overnight. Dialysis today with SBT.  Severe Hypercapnic Respiratory Failure -continue Full MV support -continue Bronchodilator Therapy: duoneb -Wean Fio2 and PEEP as tolerated -will perform SAT/SBt when respiratory parameters are met  -patient following commands this morning on 0.4 of precedex -ABG shows continued improvement (pH: 7.32 and CO2 52) -CXR:  7/18 shows improvement of pneumothorax with unchanged bilateral pleural effusions  End Stage Renal Disease -on dialysis, nephrology following -dialysis today. Per Dr. Assunta Gambles note, patient's last dialysis treatment was 7/11.   NEUROLOGY - intubated and sedated, following commands - minimal sedation to achieve a RASS goal: -1 -acute encephalopathy secondary to hypercarbic respiratory failure   Cardiology: -Blood pressure has improved on levophed overnight, tapering levophed as tolerated -Echo performed 7/17: shows EF of 30-35% with aortic regurgitation. EF unchanged from echo 07/2017  ID -continue IV abx as prescribed: vancomycin -follow up cultures: Blood cultures drawn 7/17 show no growth  -pleural fluid: initial studies show exudative fluid with WBC -afebrile  GI: -RUQ [place] 7/17: dilated bile duct w/sludge. No evidence of gallstones or wall thickening. Pending official read -Lipase normal  Endocrine: continue blood glucose monitoring, no concerns  DVT: start heparin drip  GI: Pepcid Start tube feeds today  TRANSFUSIONS AS NEEDED MONITOR FSBS ASSESS the need for LABS as needed   Critical Care Time devoted to patient care services described in this note is 28 minutes.   Overall, patient is critically ill, prognosis is guarded.  Patient with Multiorgan failure and at high risk for cardiac arrest and death.

## 2018-04-11 NOTE — Progress Notes (Signed)
No charge note.  Palliative care:  Consult received and chart reviewed. Patient assessed - intubated, unable to participate in GOC discussion at this time. No family at bedside. Multiple attempts made to speak with family via phone - no answer and no voice mailbox setup. Will continue to make attempts to reach family for GOC conversation.  Thank you for this consult.  Gerlean RenShae Lee Faline Langer, DNP, AGNP-C Palliative Medicine Team Team Phone # [phone]  Pager # [phone]

## 2018-04-11 NOTE — Progress Notes (Addendum)
Initial Nutrition Assessment  DOCUMENTATION CODES:   Severe malnutrition in context of chronic illness  INTERVENTION:  Initiate Vital AF 1.2 at 45 mL/hr (1080 mL goal daily volume) via OGT. Provides 1296 kcal, 81 grams of protein, 1823 mg potassium, 912 mg phosphorus, 875 mL H2O daily.  Recommend minimum free water flush of 30 mL Q4hrs to maintain tube patency.  Provide B-complex with C QHS per tube.  Monitor magnesium, potassium, and phosphorus daily for at least 3 days, MD to replete as needed, as pt is at risk for refeeding syndrome given severe malnutrition.  NUTRITION DIAGNOSIS:   Severe Malnutrition related to chronic illness(ESRD on HD, CHF) as evidenced by severe fat depletion, severe muscle depletion.  GOAL:   Provide needs based on ASPEN/SCCM guidelines  MONITOR:   Vent status, Labs, Weight trends, TF tolerance, Skin, I & O's  REASON FOR ASSESSMENT:   Ventilator    ASSESSMENT:   77 year old female with PMHx of HTN, hypothyroidism, CHF, glaucoma with blindness, ESRD on HD TTS admitted with pleural effusion s/p US guided right-sided thoracentesis on 7/17 and later developed a pneumothorax and required emergent intubation on 7/17.   -Chest tube was placed on 7/17. -Pending PMT consult.  Patient intubated and sedated. Receiving HD at time of RD assessment. On PRVC mode with FiO2 30%. No family members present. Patient is from [place]. Per chart sent over from facility she is typically on a regular diet with thin liquids and drinks Nepro BID. Unsure of patient's dry weight, but it appears her weight has been decreasing recently. She was 134.1 lbs on 08/15/2017, 129.4 lbs on 08/22/2017, 133.4 lbs on 10/20/2017, 126.6 lbs on 02/13/2018, and is currently 110.7 lbs. She has lost 15.9 lbs (12.6% body weight) over the past 2 months, which is significant for time frame.  Access: 18 Fr. OGT placed 7/17; terminates in stomach per abdominal x-ray 7/17; 50 cm at corner of  mouth  MAP: 52-89 mmHg  Patient is currently intubated on ventilator support Ve: 9.3 L/min Temp (24hrs), Avg:97.3 F (36.3 C), Min:94.2 F (34.6 C), Max:98.4 F (36.9 C)  Propofol: N/A  Medications reviewed and include: Epogen 10000 units TTS with HD, famotidine, levothyroxine, Remeron 7.5 mg QHS, Precedex gtt, D5 LR at 50 mL/hr (60 grams dextrose, 204 kcal daily), Levaquin, norepinephrine gtt at 2 mcg/min, vancomycin.  Labs reviewed: CBG 115-124, BUN 77, Creatinine 4.21, Phosphorus 5.4, BNP 543.  I/O: pt anuric per report in rounds  Discussed with RN and on rounds. Plan is for SBT following HD today. If patient does not extubate plan is to initiate tube feeds.  NUTRITION - FOCUSED PHYSICAL EXAM:    Most Recent Value  Orbital Region  Severe depletion  Upper Arm Region  Severe depletion  Thoracic and Lumbar Region  Severe depletion  Buccal Region  Unable to assess  Temple Region  Severe depletion  Clavicle Bone Region  Severe depletion  Clavicle and Acromion Bone Region  Severe depletion  Scapular Bone Region  Unable to assess  Dorsal Hand  Severe depletion  Patellar Region  Severe depletion  Anterior Thigh Region  Severe depletion  Posterior Calf Region  Severe depletion  Edema (RD Assessment)  Mild  Hair  Reviewed  Eyes  Unable to assess  Mouth  Unable to assess  Skin  Reviewed  Nails  Reviewed     Diet Order:   Diet Order           Diet NPO time specified  Diet effective now          EDUCATION NEEDS:   No education needs have been identified at this time  Skin:  Skin Assessment: Reviewed RN Assessment(old scarring on sacrum)  Last BM:  04/10/2018 - small type 4  Height:   Ht Readings from Last 1 Encounters:  04/10/18 5\' 2"  (1.575 m)    Weight:   Wt Readings from Last 1 Encounters:  04/11/18 110 lb 10.7 oz (50.2 kg)    Ideal Body Weight:  50 kg  BMI:  Body mass index is 20.24 kg/m.  Estimated Nutritional Needs:   Kcal:  1255 (25  kcal/kg)  Protein:  75-90 grams (1.5-1.8 grams/kg)  Fluid:  UOP + 1 L  Helane RimaLeanne Emmersyn Kratzke, MS, RD, LDN Office: [phone] Pager: [phone] After Hours/Weekend Pager: [phone]

## 2018-04-11 NOTE — Progress Notes (Signed)
Informed MD of clotting in chamber and decrease in BFR.

## 2018-04-11 NOTE — Progress Notes (Signed)
[facility]  ROUNDING NOTE   Subjective:   Niece at bedside.  Seen and examined on hemodialysis.   Remains intubated and requiring vasopressors.   Objective:  Vital signs in last 24 hours:  Temp:  [94.2 F (34.6 C)-98.4 F (36.9 C)] 97.9 F (36.6 C) (07/18 1400) Pulse Rate:  [60-108] 108 (07/18 1345) Resp:  [15-30] 21 (07/18 1400) BP: (63-128)/(44-83) 108/78 (07/18 1400) SpO2:  [95 %-100 %] 99 % (07/18 1345) FiO2 (%):  [30 %] 30 % (07/18 1310) Weight:  [50.2 kg (110 lb 10.7 oz)] 50.2 kg (110 lb 10.7 oz) (07/18 0454)  Weight change: -6.4 kg (-14 lb 1.7 oz) Filed Weights   04/10/18 0308 04/10/18 1151 04/11/18 0454  Weight: 49.4 kg (108 lb 14.5 oz) 50.3 kg (110 lb 14.3 oz) 50.2 kg (110 lb 10.7 oz)    Intake/Output: I/O last 3 completed shifts: In: 1002.2 [I.V.:972.2; NG/GT:30] Out: 69 [Chest Tube:85]   Intake/Output this shift:  Total I/O In: 170 [I.V.:140; NG/GT:30] Out: -481 [Chest Tube:30]  Physical Exam: General: Critically Ill  Head: ETT, OGT  Eyes: Anicteric, PERRL  Neck: supple  Lungs:  PRVC FiO2 30%  Heart: Regular rate and rhythm  Abdomen:  Soft, nontender  Extremities:  no peripheral edema.  Neurologic: Intubated, sedated  Skin: No lesions  Access: Right AVF    Basic Metabolic Panel: Recent Labs  Lab 04/10/18 0001 04/10/18 0417 04/10/18 1229 04/11/18 0445  NA 141 139 138 142  K 4.8 4.7 4.7 4.9  CL 102 101 102 105  CO2 _0 GLUCOSE 113* 97 225* 143*  BUN 71* 73* 74* 77*  CREATININE 4.09* 4.18* 4.12* 4.21*  CALCIUM 8.6* 8.4* 8.2* 8.5*  MG  --   --  2.3 2.3  PHOS  --   --   --  5.4*    Liver Function Tests: Recent Labs  Lab 04/10/18 0001 04/10/18 1229 04/11/18 0445  AST 30 33 31  ALT _1 ALKPHOS 118 120 105  BILITOT 0.7 0.7 0.7  PROT 6.6 6.8 6.5  ALBUMIN 2.9* 2.9* 2.8*   Recent Labs  Lab 04/11/18 0445  LIPASE 29   Recent Labs  Lab 04/11/18 0643  AMMONIA 15    CBC: Recent Labs  Lab  03/27/2018 2125 04/10/18 0417 04/10/18 1229 04/11/18 0445  WBC 7.1 6.2 11.6* 10.0  NEUTROABS 4.8  --   --  8.9*  HGB 10.1* 11.0* 10.2* 9.6*  HCT 31.2* 33.6* 32.3* 29.4*  MCV 86.5 87.2 88.8 87.3  PLT 389 412 413 355    Cardiac Enzymes: Recent Labs  Lab 04/10/18 0001 04/10/18 1229 04/10/18 1827 04/11/18 0046  TROPONINI <0.03 <0.03 0.07* 0.07*    BNP: Invalid input(s): POCBNP  CBG: Recent Labs  Lab 04/10/18 1917 04/11/18 0014 04/11/18 0347 04/11/18 0717 04/11/18 1155  GLUCAP 131* 162* 66* 118* 115*    Microbiology: Results for orders placed or performed during the hospital encounter of 04/11/2018  MRSA PCR Screening     Status: None   Collection Time: 04/10/18  3:02 AM  Result Value Ref Range Status   MRSA by PCR NEGATIVE NEGATIVE Final    Comment:        The GeneXpert MRSA Assay (FDA approved for NASAL specimens only), is one component of a comprehensive MRSA colonization surveillance program. It is not intended to diagnose MRSA infection nor to guide or monitor treatment for MRSA infections. Performed at [facility], [place],  [place], [place] [address]   CULTURE, BLOOD (ROUTINE X 2) w Reflex to ID Panel     Status: None (Preliminary result)   Collection Time: 04/10/18 12:29 PM  Result Value Ref Range Status   Specimen Description BLOOD L HAND  Final   Special Requests   Final    BOTTLES DRAWN AEROBIC AND ANAEROBIC Blood Culture adequate volume   Culture   Final    NO GROWTH < 24 HOURS Performed at [facility], [address] [place], [place] [address]    Report Status PENDING  Incomplete  Body fluid culture with gram stain     Status: None (Preliminary result)   Collection Time: 04/10/18 12:40 PM  Result Value Ref Range Status   Specimen Description   Final    PLEURAL Performed at [facility], [address], [place], [place] [address]    Special Requests   Final    PLEURAL Performed at [facility], [address] [place], [place] [address]    Gram Stain   Final    ABUNDANT WBC PRESENT, PREDOMINANTLY MONONUCLEAR NO ORGANISMS SEEN    Culture   Final    NO GROWTH < 24 HOURS Performed at [facility], [place] [address], [place], [place] [address]    Report Status PENDING  Incomplete  CULTURE, BLOOD (ROUTINE X 2) w Reflex to ID Panel     Status: None (Preliminary result)   Collection Time: 04/10/18  3:09 PM  Result Value Ref Range Status   Specimen Description BLOOD LT HAND  Final   Special Requests   Final    BOTTLES DRAWN AEROBIC AND ANAEROBIC Blood Culture adequate volume   Culture   Final    NO GROWTH < 24 HOURS Performed at [facility], [address] [place], [place] [address]    Report Status PENDING  Incomplete    Coagulation Studies: Recent Labs    04/11/18 0445  LABPROT 14.4  INR 1.13    Urinalysis: No results for input(s): COLORURINE, LABSPEC, PHURINE, GLUCOSEU, HGBUR, BILIRUBINUR, KETONESUR, PROTEINUR, UROBILINOGEN, NITRITE, LEUKOCYTESUR in the last 72 hours.  Invalid input(s): APPERANCEUR    Imaging: Ct Abdomen Pelvis Wo Contrast  Result Date: 04/21/2018 CLINICAL DATA:  Patient refused dialysis today and presents with abdominal pain and tachycardia. EXAM: CT CHEST, ABDOMEN AND PELVIS WITHOUT CONTRAST TECHNIQUE: Multidetector CT imaging of the chest, abdomen and pelvis was performed following the standard protocol without IV contrast. COMPARISON:  CXR 04/12/2018 FINDINGS: CT CHEST FINDINGS Cardiovascular: Cardiomegaly with three-vessel coronary arteriosclerosis. Trace pericardial effusion. Minimal aortic atherosclerosis without aneurysm. Mild dilatation of the main pulmonary artery to 3 cm may reflect a component of chronic pulmonary hypertension. Mediastinum/Nodes: No thyromegaly. Midline patent trachea and mainstem bronchi. No adenopathy. Esophagus is unremarkable. Lungs/Pleura: Moderate left and moderate to large right pleural effusions  with adjacent compressive atelectasis. Bronchiectasis to both lower lobes. Subpleural atelectasis in the lingula left lower lobe. Musculoskeletal: Thoracic spondylosis. No aggressive osseous lesions. CT ABDOMEN PELVIS FINDINGS Hepatobiliary: Markedly distended gallbladder with punctate dependent calculus noted. No wall thickening or pericholecystic fluid. The unenhanced liver is unremarkable. Portal vein is difficult to assess given lack of IV contrast there is some heterogeneity seen within. Pancreas: Atrophic without acute abnormality. Spleen: Normal Adrenals/Urinary Tract: Atrophic kidneys. No nephrolithiasis nor hydroureteronephrosis. The urinary bladder demonstrates tiny layering calculi along the left posterior wall. Stomach/Bowel: Stomach is within normal limits. Appendix appears normal. No evidence of bowel wall thickening, distention, or inflammatory changes.  Vascular/Lymphatic: Nonaneurysmal moderate atherosclerosis of the aorta and branch vessels. No lymphadenopathy. Reproductive: Uterus and bilateral adnexa are unremarkable. Other: Diffuse soft tissue anasarca. Phleboliths and/or injection granulomata are identified overlying the buttocks. Musculoskeletal: No acute osseous abnormality. Lumbar spondylosis with degenerative disc disease L4-5 and L5-S1 with multilevel facet arthropathy. IMPRESSION: Chest CT: 1. Cardiomegaly with 3 vessel coronary arteriosclerosis. 2. Bilateral pleural effusions with compressive atelectasis, moderate on the left and moderate to large on the right. 3. Slight dilatation of the main pulmonary artery to 3 cm compatible with chronic pulmonary hypertension. 4. Thoracic spondylosis. CT AP: 1. Marked distention of the gallbladder without pericholecystic fluid or wall thickening. Punctate dependent calculus is identified within. Findings may represent a hydropic gallbladder or a fasting state of the gallbladder counting for the distention. 2. Atrophic kidneys without obstructive  uropathy. Findings would be in keeping with the patient's dialysis dependence. 3. Diffuse soft tissue anasarca. 4. Degenerative disc disease L4-5 and L5-S1. Electronically Signed   By: Ashley Royalty M.D.   On: 04/20/2018 23:20   Dg Chest 1 View  Result Date: 04/10/2018 CLINICAL DATA:  Post right-sided thoracentesis. Known right-sided pneumothorax. EXAM: CHEST  1 VIEW COMPARISON:  Chest radiograph-04/14/2018; 02/12/2018; chest CT-04/12/2018 FINDINGS: Grossly unchanged enlarged cardiac silhouette and mediastinal contours. Interval reduction/resolution of right-sided pleural effusion post thoracentesis. Minimally improved aeration of the right lung base with persistent right basilar hydropneumothorax. No mediastinal shift. Minimally improved aeration the right lower lung with persistent heterogeneous/consolidative opacities. Grossly unchanged small left-sided effusion with associated left basilar heterogeneous/consolidative opacities. No new focal airspace opacities. Mild pulmonary venous congestion. No acute osseus abnormalities. IMPRESSION: 1. Interval reduction/resolution of right-sided effusion post thoracentesis with residual right basilar pneumothorax. 2. Unchanged small left-sided effusion with associated left basilar heterogeneous/consolidative opacities, atelectasis versus infiltrate. 3. Minimally improved aeration of the right lung base. Critical Value/emergent results were called by telephone at the time of interpretation on 04/10/2018 at 11:09 am to Dr. Posey Pronto, who verbally acknowledged these results. Electronically Signed   By: Sandi Mariscal M.D.   On: 04/10/2018 11:08   Dg Abd 1 View  Result Date: 04/10/2018 CLINICAL DATA:  Orogastric tube placement EXAM: ABDOMEN - 1 VIEW COMPARISON:  None. FINDINGS: Orogastric tube tip and side port project over the stomach. Bilateral pleural effusions with associated atelectasis. Prominent small bowel in the central abdomen. IMPRESSION: Orogastric tube tip and side  port projecting over the stomach. Electronically Signed   By: Ulyses Jarred M.D.   On: 04/10/2018 14:53   Ct Chest Wo Contrast  Result Date: 04/03/2018 CLINICAL DATA:  Patient refused dialysis today and presents with abdominal pain and tachycardia. EXAM: CT CHEST, ABDOMEN AND PELVIS WITHOUT CONTRAST TECHNIQUE: Multidetector CT imaging of the chest, abdomen and pelvis was performed following the standard protocol without IV contrast. COMPARISON:  CXR 03/30/2018 FINDINGS: CT CHEST FINDINGS Cardiovascular: Cardiomegaly with three-vessel coronary arteriosclerosis. Trace pericardial effusion. Minimal aortic atherosclerosis without aneurysm. Mild dilatation of the main pulmonary artery to 3 cm may reflect a component of chronic pulmonary hypertension. Mediastinum/Nodes: No thyromegaly. Midline patent trachea and mainstem bronchi. No adenopathy. Esophagus is unremarkable. Lungs/Pleura: Moderate left and moderate to large right pleural effusions with adjacent compressive atelectasis. Bronchiectasis to both lower lobes. Subpleural atelectasis in the lingula left lower lobe. Musculoskeletal: Thoracic spondylosis. No aggressive osseous lesions. CT ABDOMEN PELVIS FINDINGS Hepatobiliary: Markedly distended gallbladder with punctate dependent calculus noted. No wall thickening or pericholecystic fluid. The unenhanced liver is unremarkable. Portal vein is difficult to assess given lack of IV  contrast there is some heterogeneity seen within. Pancreas: Atrophic without acute abnormality. Spleen: Normal Adrenals/Urinary Tract: Atrophic kidneys. No nephrolithiasis nor hydroureteronephrosis. The urinary bladder demonstrates tiny layering calculi along the left posterior wall. Stomach/Bowel: Stomach is within normal limits. Appendix appears normal. No evidence of bowel wall thickening, distention, or inflammatory changes. Vascular/Lymphatic: Nonaneurysmal moderate atherosclerosis of the aorta and branch vessels. No lymphadenopathy.  Reproductive: Uterus and bilateral adnexa are unremarkable. Other: Diffuse soft tissue anasarca. Phleboliths and/or injection granulomata are identified overlying the buttocks. Musculoskeletal: No acute osseous abnormality. Lumbar spondylosis with degenerative disc disease L4-5 and L5-S1 with multilevel facet arthropathy. IMPRESSION: Chest CT: 1. Cardiomegaly with 3 vessel coronary arteriosclerosis. 2. Bilateral pleural effusions with compressive atelectasis, moderate on the left and moderate to large on the right. 3. Slight dilatation of the main pulmonary artery to 3 cm compatible with chronic pulmonary hypertension. 4. Thoracic spondylosis. CT AP: 1. Marked distention of the gallbladder without pericholecystic fluid or wall thickening. Punctate dependent calculus is identified within. Findings may represent a hydropic gallbladder or a fasting state of the gallbladder counting for the distention. 2. Atrophic kidneys without obstructive uropathy. Findings would be in keeping with the patient's dialysis dependence. 3. Diffuse soft tissue anasarca. 4. Degenerative disc disease L4-5 and L5-S1. Electronically Signed   By: Ashley Royalty M.D.   On: 03/25/2018 23:20   Dg Chest Port 1 View  Result Date: 04/11/2018 CLINICAL DATA:  Intubation. EXAM: PORTABLE CHEST 1 VIEW COMPARISON:  04/10/2018. FINDINGS: Endotracheal tube, NG tube, right subclavian line, right chest tube in stable position. Stable cardiomegaly. Persistent bibasilar atelectasis/infiltrates and bilateral pleural effusions. Persistent right-sided pneumothorax with slight interim improvement. IMPRESSION: 1. Lines and tubes including right chest tube in stable position. Slight improvement of right-sided pneumothorax. 2. Bibasilar atelectasis and bilateral pleural effusions, left side greater than right again noted. No significant change from prior exam. Electronically Signed   By: Marcello Moores  Register   On: 04/11/2018 07:37   Dg Chest Port 1 View  Result Date:  04/10/2018 CLINICAL DATA:  Post intubation and right-sided chest tube placement EXAM: PORTABLE CHEST 1 VIEW COMPARISON:  Earlier same day (multiple examinations) 04/14/2018; chest CT-04/09/2028 FINDINGS: Grossly unchanged cardiac silhouette and mediastinal contours with partial obscuration of the left heart border secondary to unchanged small left-sided effusion associated left basilar heterogeneous/consolidative opacities. No new focal airspace opacities. Endotracheal tube overlies the tracheal air column with tip superior to the carina. Enteric tube tip and side port project below the left hemidiaphragm. Interval placement of right subclavian vein approach central venous catheter with tip projected over the superior cavoatrial junction. Interval placement of a right lateral chest tube with reduction/resolution of right-sided effusion and unchanged small right basilar hydropneumothorax. No mediastinal shift. No acute osseus abnormalities. Degenerative change of the right glenohumeral joint is suspected though incompletely evaluated. IMPRESSION: 1. Interval placement of right-sided chest tube with reduction/resolution of right-sided effusion post thoracentesis in grossly unchanged small right basilar hydropneumothorax. 2. Right subclavian vein approach central venous catheter tip projects over the superior cavoatrial junction. 3. Stable position of remaining support apparatus. 4. Unchanged small left-sided effusion associated left basilar opacities, atelectasis versus infiltrate. No new focal airspace opacities. Electronically Signed   By: Sandi Mariscal M.D.   On: 04/10/2018 14:47   Portable Chest X-ray  Result Date: 04/10/2018 CLINICAL DATA:  77 year old female with a history of intubation. EXAM: PORTABLE CHEST 1 VIEW COMPARISON:  04/10/2018, 02/12/2018, chest CT 04/03/2018 FINDINGS: Cardiomediastinal silhouette unchanged in size and contour, with the heart  borders partially obscured by overlying lung/pleural  disease. Dense opacities at the bilateral lung bases, worst on the left. Small right-sided pneumothorax persists. Interval placement of endotracheal tube which terminates approximately 3.2 cm above the carina. Interval placement of gastric tube terminating in the left upper abdomen out of the field of view. IMPRESSION: Interval placement of endotracheal tube, terminating above the carina approximately 3.2 cm. Interval placement of gastric tube. Similar appearance of right hydropneumothorax, unchanged from the prior. Dense opacity at the left base, likely a combination of atelectasis/consolidation, pleural fluid, and/or edema. Electronically Signed   By: Corrie Mckusick D.O.   On: 04/10/2018 13:23   Dg Chest Port 1 View  Result Date: 04/10/2018 CLINICAL DATA:  Post recent right-sided thoracentesis with persistent respiratory distress. EXAM: PORTABLE CHEST 1 VIEW COMPARISON:  Earlier same day; 04/02/2018; chest CT-04/16/2018 FINDINGS: Grossly unchanged enlarged cardiac silhouette and mediastinal contours. Grossly unchanged small loculated right basilar hydropneumothorax post recent thoracentesis. No midline shift. Unchanged small left-sided effusion with associated left basilar opacities. No new focal airspace opacities. Mild pulmonary venous congestion without frank evidence of edema. No acute osseus abnormalities. No acute osseus abnormalities. IMPRESSION: 1. Grossly unchanged small loculated right basilar hydropneumothorax post recent thoracentesis. 2. Unchanged small left-sided effusion associated left basilar opacities, atelectasis versus infiltrate. 3. Pulmonary venous congestion without frank evidence of edema. Electronically Signed   By: Sandi Mariscal M.D.   On: 04/10/2018 12:25   Dg Chest Portable 1 View  Result Date: 04/13/2018 CLINICAL DATA:  Shortness of breath. Refused dialysis today. History of CHF. EXAM: PORTABLE CHEST 1 VIEW COMPARISON:  Chest radiograph Feb 12, 2018 FINDINGS: Stable cardiomegaly.  Pulmonary vascular congestion. Similar moderate to large bilateral pleural effusions. Biapical pleural capping. Prominent pleural reflection mid lung zone. No mediastinal shift. Soft tissue planes and included osseous structures are nonacute. High riding bilateral humeral heads seen with old rotator cuff injuries. IMPRESSION: 1. Moderate to large pleural effusions, possible RIGHT hydropneumothorax without mediastinal shift. Similar underlying consolidation. 2. Stable cardiomegaly and pulmonary vascular congestion. 3. Acute findings discussed with and reconfirmed by Dr.GRAYDON GOODMAN on 04/21/2018 at 9:49 pm. Electronically Signed   By: Elon Alas M.D.   On: 04/03/2018 21:49   US Abdomen Limited Ruq  Result Date: 04/11/2018 CLINICAL DATA:  Question of gallstones on recent CT EXAM: ULTRASOUND ABDOMEN LIMITED RIGHT UPPER QUADRANT COMPARISON:  CT abdomen and pelvis April 09, 2018 FINDINGS: Gallbladder: Gallbladder appears somewhat distended with irregularities along the gallbladder wall. Sludge is seen in the gallbladder. There are no convincing echogenic foci which move and shadow as is expected with gallstones. There is no pericholecystic fluid. No sonographic Murphy sign noted by sonographer. Common bile duct: Diameter: 4 mm. No intrahepatic or extrahepatic biliary duct dilatation. Liver: No focal lesion identified. Within normal limits in parenchymal echogenicity. Portal vein is patent on color Doppler imaging with normal direction of blood flow towards the liver. Trace ascites. IMPRESSION: 1. Sludge in gallbladder. No convincing gallstones. Tiny gallstones could be obscured by sludge. The gallbladder is somewhat distended with mild irregularity along the gallbladder wall. Gallbladder wall is not frankly thickened, however. Question underlying inflammation within the gallbladder wall. This combination of findings may warrant correlation with nuclear medicine hepatobiliary imaging study to assess for cystic  duct patency. 2.  Slight ascites. 3.  Study otherwise unremarkable. Electronically Signed   By: Lowella Grip III M.D.   On: 04/11/2018 08:59   US Thoracentesis Asp Pleural Space W/img Guide  Result Date: 04/10/2018 INDICATION: Symptomatic  right sided pleural effusion. Note, patient has known right basilar hydropneumothorax demonstrated on preceding chest radiograph and chest CT. EXAM: US THORACENTESIS ASP PLEURAL SPACE W/IMG GUIDE COMPARISON:  Chest radiograph-04/12/2018; chest CT-04/23/2018; MEDICATIONS: None. COMPLICATIONS: SIR LEVEL B - Normal therapy, includes overnight admission for observation. Patient experienced a sudden transient decreased oxygen saturation to the level of 40s necessitating calling of a rapid response, however her oxygen saturation levels normalized to the mid 90s with the administration of supplement total oxygen and postprocedural chest radiograph demonstrated resolution of the right-sided effusion with no change in the size of the right basilar pneumothorax and no additional intervention was required. TECHNIQUE: Informed written consent was obtained from the patient after a discussion of the risks, benefits and alternatives to treatment. A timeout was performed prior to the initiation of the procedure. With the patient positioned left lateral decubitus, initial ultrasound scanning demonstrates a moderate right-sided anechoic pleural effusion. The lower chest was prepped and draped in the usual sterile fashion. 1% lidocaine was used for local anesthesia. An ultrasound image was saved for documentation purposes. An 8 Fr Safe-T-Centesis catheter was introduced. The thoracentesis was performed. The catheter was removed and a dressing was applied. Patient experienced a sudden transient decreased oxygen saturation to the level of 40s necessitating calling of a rapid response, however her oxygen saturation levels normalized to the mid 90s with the administration of supplement total  oxygen. The patient had a portable upright chest radiograph which demonstrated resolution of the right-sided effusion with no change in the size of the right basilar pneumothorax and no additional intervention was required. FINDINGS: A total of approximately 400 cc of blood-tinged fluid was removed. IMPRESSION: Successful ultrasound-guided right sided thoracentesis yielding 400 cc of blood tinged pleural fluid. Patient experienced a sudden transient decreased oxygen saturation to the level of 40s necessitating calling of a rapid response, however her oxygen saturation levels normalized to the mid 90s with the administration of supplement total oxygen and postprocedural chest radiograph demonstrated resolution of the right-sided effusion with no change in the size of the right basilar pneumothorax and no additional intervention was required. Electronically Signed   By: Sandi Mariscal M.D.   On: 04/10/2018 11:20     Medications:   . dexmedetomidine (PRECEDEX) IV infusion 0.4 mcg/kg/hr (04/11/18 1112)  . dextrose 5% lactated ringers 50 mL/hr at 04/11/18 0800  . levofloxacin (LEVAQUIN) IV    . norepinephrine (LEVOPHED) Adult infusion 4 mcg/min (04/11/18 1135)  . vancomycin Stopped (04/11/18 1405)   . aspirin  81 mg Per Tube Daily  . chlorhexidine gluconate (MEDLINE KIT)  15 mL Mouth Rinse BID  . epoetin (EPOGEN/PROCRIT) injection  10,000 Units Intravenous Q T,Th,Sa-HD  . famotidine  20 mg Per Tube Daily  . heparin  5,000 Units Subcutaneous Q8H  . ipratropium-albuterol  3 mL Nebulization Q6H  . latanoprost  1 drop Both Eyes QHS  . levothyroxine  25 mcg Intravenous Daily  . mouth rinse  15 mL Mouth Rinse 10 times per day  . mirtazapine  7.5 mg Oral QHS  . sodium chloride flush  10-40 mL Intracatheter Q12H   acetaminophen **OR** acetaminophen, docusate, fentaNYL (SUBLIMAZE) injection, fentaNYL (SUBLIMAZE) injection, ondansetron **OR** ondansetron (ZOFRAN) IV, pentafluoroprop-tetrafluoroeth, sodium  chloride flush  Assessment/ Plan:  Ms. Avilyn Virtue is a 77 y.o. black female with end stage renal disease on hemodialysis TTS, hypertension, tobacco abuse, glaucoma with blindness, hypothyroidism, congestive heart failure who is admitted to [facility] on 04/12/2018.   Patient with large thoracentesis this  morning, however patient developed a pneumothorax and required mechanical ventilation and intubation.   [facility] TTS [facility] EDW 51.5kg Right AVF  1. End Stage Renal Disease: seen and examined on hemodialysis treatment. Tolerating treatment.  Monitor daily for dialysis need, next scheduled treatment for Saturday.   2. Hypotension:  - Currently requiring vasopressors. Norepinephrine.   3. Anemia of chronic kidney disease: hemoglobin 9.6 - EPO as outpatient  4. Secondary Hyperparathyroidism: Labs from 7/9: PTH 219, calcium 8.2 phosphorus 3.8 - at goal.    LOS: 1 Sarath Kolluru 7/18/20192:12 PM

## 2018-04-11 NOTE — Progress Notes (Signed)
Pre HD Assessment    04/11/18 1030  Neurological  Level of Consciousness Responds to Voice  Orientation Level Intubated/Tracheostomy - Unable to assess  Respiratory  Respiratory Pattern Regular  Chest Assessment Chest expansion symmetrical  Bilateral Breath Sounds Clear  Cough None  Cardiac  Pulse Regular  Heart Sounds S1, S2  ECG Monitor Yes  Vascular  R Radial Pulse +2  L Radial Pulse +2  Psychosocial  Psychosocial (WDL) WDL

## 2018-04-11 NOTE — Progress Notes (Signed)
Pre HD Tx    04/11/18 1030  Vital Signs  Temp 98.1 F (36.7 C)  Temp Source Core  Pulse Rate 99  Pulse Rate Source Monitor  Resp (!) 23  BP 116/63  BP Location Left Arm  BP Method Automatic  Patient Position (if appropriate) Lying  Oxygen Therapy  SpO2 96 %  O2 Device Ventilator  End Tidal CO2 (EtCO2) 43  Pain Assessment  Pain Scale 0-10  Pain Score Asleep  Time-Out for Hemodialysis  What Procedure? HD  Pt Identifiers(min of two) First/Last Name;MRN/Account#  Correct Site? Yes  Correct Side? Yes  Correct Procedure? Yes  Consents Verified? Yes  Rad Studies Available? N/A  Safety Precautions Reviewed? Yes  Biochemist, clinicalMachine Checks  Machine Number [id number]  [place]  [place] [place]  UF/Alarm Test Passed  Conductivity: Meter 14  Conductivity: Machine  13.9  pH 7.4  Reverse Osmosis Portable   Normal Saline Lot Number [id number]  Dialyzer Lot Number [id number]  Disposable Set Lot Number [id number]  Machine Temperature 98.6 F (37 C)  ImmunologistAir Detector Armed and Audible Yes  Blood Lines Intact and Secured Yes  Pre Treatment Patient Checks  Vascular access used during treatment Fistula  Hepatitis B Surface Antigen Results  (unknown )  Date Hepatitis B Surface Antigen Drawn 04/11/18  Isolation Initiated Yes  Hemodialysis Consent Verified Yes  ECG (Telemetry) Monitor On Yes  Prime Ordered Normal Saline  Length of  DialysisTreatment -hour(s) 3 Hour(s)  Dialysis Treatment Comments Na 140  Dialyzer Elisio 17H NR  Dialysate 2K, 2.5 Ca  Dialysis Anticoagulant None  Dialysate Flow Ordered 600  Blood Flow Rate Ordered 300 mL/min  Ultrafiltration Goal 0 Liters  Dialysis Blood Pressure Support Ordered Normal Saline  Fistula / Graft Right Forearm Arteriovenous fistula  Placement Date/Time: 10/19/17 0850   Placed prior to admission: No  Orientation: Right  Access Location: Forearm  Access Type: Arteriovenous fistula  Site Condition No complications  Status Patent

## 2018-04-11 NOTE — Progress Notes (Signed)
Primary RN notified of droppng b/p for intervention.

## 2018-04-11 NOTE — Progress Notes (Signed)
Pharmacy Antibiotic Note  Ann Cunningham is a 77 y.o. female admitted on 03/30/2018 with sepsis. Patient being treated for possible cellulitis/abscess. Pharmacy has been consulted for vancomycin dosing. Patient receives regularly scheduled dialysis Tuesday/Thursday/Saturday. Patient receiving dialysis 7/18.   Plan: Continue vancomycin 500mg  IV to be infused during the last hour of dialysis. Will obtain trough prior to 3rd dialysis session on vancomycin.    Levofloxacin 500mg  IV Q24hr ordered by CCM. Will transition patient to 500mg  IV x 1 followed by levofloxacin 250mg  IV Q48hr.   Please note patient tolerated cefazolin during previous procedure.   Height: 5\' 2"  (157.5 cm) Weight: 110 lb 10.7 oz (50.2 kg) IBW/kg (Calculated) : 50.1  Temp (24hrs), Avg:97.2 F (36.2 C), Min:94.2 F (34.6 C), Max:98.4 F (36.9 C)  Recent Labs  Lab 2017/10/05 2125 04/10/18 0001 04/10/18 0417 04/10/18 1229 04/11/18 0445  WBC 7.1  --  6.2 11.6* 10.0  CREATININE  --  4.09* 4.18* 4.12* 4.21*    Estimated Creatinine Clearance: 8.9 mL/min (A) (by C-G formula based on SCr of 4.21 mg/dL (H)).    Allergies  Allergen Reactions  . Penicillins Other (See Comments)    Has patient had a PCN reaction causing immediate rash, facial/tongue/throat swelling, SOB or lightheadedness with hypotension: Unknown Has patient had a PCN reaction causing severe rash involving mucus membranes or skin necrosis: Unknown Has patient had a PCN reaction that required hospitalization: Unknown Has patient had a PCN reaction occurring within the last 10 years: Unknown If all of the above answers are "NO", then may proceed with Cephalosporin use.     Antimicrobials this admission: Vancomycin 7/17 >>  Levofloxacin 7/18 >>  Dose adjustments this admission: 7/18 Levofloxacin renally adjusted   Microbiology results: 7/17 BCx: no growth < 24 hours  7/17 Body fluid Culture: no growth < 24 hours  7/17 MRSA PCR: negative   Thank you  for allowing pharmacy to be a part of this patient's care.  Micah Galeno L 04/11/2018 12:55 PM

## 2018-04-11 NOTE — Progress Notes (Signed)
[place] [facility]                                                                                                                                                                                  Patient Demographics   Ann Cunningham, is a 77 y.o. female, DOB - 11-Feb-1941, VQM:[id number]  Admit date - 04/04/2018   Admitting Physician Dustin Flock, MD  Outpatient Primary MD for the patient is [facility], [place]   LOS - 1  Subjective:  Patient had right-sided chest tube placement continues to be on the ventilator requiring pressors  Review of Systems:   CONSTITUTIONAL: Sedated  Vitals:   Vitals:   04/11/18 1330 04/11/18 1337 04/11/18 1345 04/11/18 1400  BP: (!) 120/57  103/62 108/78  Pulse: (!) 104  (!) 108   Resp: (!) 25 (!) 26 (!) 23 (!) 21  Temp: 97.9 F (36.6 C) 97.9 F (36.6 C) 97.9 F (36.6 C) 97.9 F (36.6 C)  TempSrc:      SpO2: 99%  99%   Weight:      Height:        Wt Readings from Last 3 Encounters:  04/11/18 50.2 kg (110 lb 10.7 oz)  02/13/18 57.4 kg (126 lb 9.6 oz)  02/04/18 60.3 kg (133 lb)     Intake/Output Summary (Last 24 hours) at 04/11/2018 1518 Last data filed at 04/11/2018 1345 Gross per 24 hour  Intake 1202.2 ml  Output -396 ml  Net 1598.2 ml    Physical Exam:   GENERAL: Patient critically ill intubated HEAD, EYES, EARS, NOSE AND THROAT: Atraumatic, normocephalic. Extraocular muscles are intact. Pupils equal and reactive to light. Sclerae anicteric. No conjunctival injection. No oro-pharyngeal erythema.  NECK: Supple. There is no jugular venous distention. No bruits, no lymphadenopathy, no thyromegaly.  HEART: Regular rate and rhythm,. No murmurs, no rubs, no clicks.  LUNGS: Diminished breath sounds ABDOMEN: Soft, flat, nontender, nondistended. Has good bowel sounds. No hepatosplenomegaly appreciated.  EXTREMITIES: No evidence of any cyanosis, clubbing, or peripheral edema.  +2 pedal and  radial pulses bilaterally.  NEUROLOGIC: The patient is alert, awake, and oriented x3 with no focal motor or sensory deficits appreciated bilaterally.  SKIN: Moist and warm with no rashes appreciated.  Psych: Not anxious, depressed LN: No inguinal LN enlargement    Antibiotics   Anti-infectives (From admission, onward)   Start     Dose/Rate Route Frequency Ordered Stop   04/11/18 1300  levofloxacin (LEVAQUIN) IVPB 500 mg     500 mg 100 mL/hr over 60 Minutes Intravenous Daily-1800 04/11/18 1242     04/11/18 1200  vancomycin (VANCOCIN) 500 mg in sodium  chloride 0.9 % 100 mL IVPB     500 mg 100 mL/hr over 60 Minutes Intravenous Every T-Th-Sa (Hemodialysis) 04/10/18 1606     04/10/18 1545  vancomycin (VANCOCIN) 1,250 mg in sodium chloride 0.9 % 250 mL IVPB     1,250 mg 166.7 mL/hr over 90 Minutes Intravenous  Once 04/10/18 1543 04/10/18 1915      Medications   Scheduled Meds: . aspirin  81 mg Per Tube Daily  . chlorhexidine gluconate (MEDLINE KIT)  15 mL Mouth Rinse BID  . epoetin (EPOGEN/PROCRIT) injection  10,000 Units Intravenous Q T,Th,Sa-HD  . famotidine  20 mg Per Tube Daily  . heparin  5,000 Units Subcutaneous Q8H  . ipratropium-albuterol  3 mL Nebulization Q6H  . latanoprost  1 drop Both Eyes QHS  . levothyroxine  25 mcg Intravenous Daily  . mouth rinse  15 mL Mouth Rinse 10 times per day  . mirtazapine  7.5 mg Oral QHS  . sodium chloride flush  10-40 mL Intracatheter Q12H   Continuous Infusions: . dexmedetomidine (PRECEDEX) IV infusion 0.2 mcg/kg/hr (04/11/18 1400)  . dextrose 5% lactated ringers 50 mL/hr at 04/11/18 1400  . levofloxacin (LEVAQUIN) IV 500 mg (04/11/18 1400)  . norepinephrine (LEVOPHED) Adult infusion 2 mcg/min (04/11/18 1503)  . vancomycin Stopped (04/11/18 1405)   PRN Meds:.acetaminophen **OR** acetaminophen, docusate, fentaNYL (SUBLIMAZE) injection, fentaNYL (SUBLIMAZE) injection, ondansetron **OR** ondansetron (ZOFRAN) IV,  pentafluoroprop-tetrafluoroeth, sodium chloride flush   Data Review:   Micro Results Recent Results (from the past 240 hour(s))  MRSA PCR Screening     Status: None   Collection Time: 04/10/18  3:02 AM  Result Value Ref Range Status   MRSA by PCR NEGATIVE NEGATIVE Final    Comment:        The GeneXpert MRSA Assay (FDA approved for NASAL specimens only), is one component of a comprehensive MRSA colonization surveillance program. It is not intended to diagnose MRSA infection nor to guide or monitor treatment for MRSA infections. Performed at [facility], [address] [place], [place] [address]   CULTURE, BLOOD (ROUTINE X 2) w Reflex to ID Panel     Status: None (Preliminary result)   Collection Time: 04/10/18 12:29 PM  Result Value Ref Range Status   Specimen Description BLOOD L HAND  Final   Special Requests   Final    BOTTLES DRAWN AEROBIC AND ANAEROBIC Blood Culture adequate volume   Culture   Final    NO GROWTH < 24 HOURS Performed at [facility], [address] [place], [place] [address]    Report Status PENDING  Incomplete  Body fluid culture with gram stain     Status: None (Preliminary result)   Collection Time: 04/10/18 12:40 PM  Result Value Ref Range Status   Specimen Description   Final    PLEURAL Performed at [facility], [address] [place], [place] [address]    Special Requests   Final    PLEURAL Performed at [facility], [address] [place], [place] [address]    Gram Stain   Final    ABUNDANT WBC PRESENT, PREDOMINANTLY MONONUCLEAR NO ORGANISMS SEEN    Culture   Final    NO GROWTH < 24 HOURS Performed at [facility], [place] [address] [place], [place] [address]    Report Status PENDING  Incomplete  CULTURE, BLOOD (ROUTINE X 2) w Reflex to ID Panel     Status: None (Preliminary result)   Collection  Time: 04/10/18  3:09 PM  Result Value Ref Range Status   Specimen Description BLOOD LT HAND   Final   Special Requests   Final    BOTTLES DRAWN AEROBIC AND ANAEROBIC Blood Culture adequate volume   Culture   Final    NO GROWTH < 24 HOURS Performed at [facility], [address], [place], [place] [address]    Report Status PENDING  Incomplete    Radiology Reports Ct Abdomen Pelvis Wo Contrast  Result Date: 03/29/2018 CLINICAL DATA:  Patient refused dialysis today and presents with abdominal pain and tachycardia. EXAM: CT CHEST, ABDOMEN AND PELVIS WITHOUT CONTRAST TECHNIQUE: Multidetector CT imaging of the chest, abdomen and pelvis was performed following the standard protocol without IV contrast. COMPARISON:  CXR 04/07/2018 FINDINGS: CT CHEST FINDINGS Cardiovascular: Cardiomegaly with three-vessel coronary arteriosclerosis. Trace pericardial effusion. Minimal aortic atherosclerosis without aneurysm. Mild dilatation of the main pulmonary artery to 3 cm may reflect a component of chronic pulmonary hypertension. Mediastinum/Nodes: No thyromegaly. Midline patent trachea and mainstem bronchi. No adenopathy. Esophagus is unremarkable. Lungs/Pleura: Moderate left and moderate to large right pleural effusions with adjacent compressive atelectasis. Bronchiectasis to both lower lobes. Subpleural atelectasis in the lingula left lower lobe. Musculoskeletal: Thoracic spondylosis. No aggressive osseous lesions. CT ABDOMEN PELVIS FINDINGS Hepatobiliary: Markedly distended gallbladder with punctate dependent calculus noted. No wall thickening or pericholecystic fluid. The unenhanced liver is unremarkable. Portal vein is difficult to assess given lack of IV contrast there is some heterogeneity seen within. Pancreas: Atrophic without acute abnormality. Spleen: Normal Adrenals/Urinary Tract: Atrophic kidneys. No nephrolithiasis nor hydroureteronephrosis. The urinary bladder demonstrates tiny layering calculi along the left posterior wall. Stomach/Bowel: Stomach is within normal limits. Appendix appears  normal. No evidence of bowel wall thickening, distention, or inflammatory changes. Vascular/Lymphatic: Nonaneurysmal moderate atherosclerosis of the aorta and branch vessels. No lymphadenopathy. Reproductive: Uterus and bilateral adnexa are unremarkable. Other: Diffuse soft tissue anasarca. Phleboliths and/or injection granulomata are identified overlying the buttocks. Musculoskeletal: No acute osseous abnormality. Lumbar spondylosis with degenerative disc disease L4-5 and L5-S1 with multilevel facet arthropathy. IMPRESSION: Chest CT: 1. Cardiomegaly with 3 vessel coronary arteriosclerosis. 2. Bilateral pleural effusions with compressive atelectasis, moderate on the left and moderate to large on the right. 3. Slight dilatation of the main pulmonary artery to 3 cm compatible with chronic pulmonary hypertension. 4. Thoracic spondylosis. CT AP: 1. Marked distention of the gallbladder without pericholecystic fluid or wall thickening. Punctate dependent calculus is identified within. Findings may represent a hydropic gallbladder or a fasting state of the gallbladder counting for the distention. 2. Atrophic kidneys without obstructive uropathy. Findings would be in keeping with the patient's dialysis dependence. 3. Diffuse soft tissue anasarca. 4. Degenerative disc disease L4-5 and L5-S1. Electronically Signed   By: Ashley Royalty M.D.   On: 04/05/2018 23:20   Dg Chest 1 View  Result Date: 04/10/2018 CLINICAL DATA:  Post right-sided thoracentesis. Known right-sided pneumothorax. EXAM: CHEST  1 VIEW COMPARISON:  Chest radiograph-03/31/2018; 02/12/2018; chest CT-04/18/2018 FINDINGS: Grossly unchanged enlarged cardiac silhouette and mediastinal contours. Interval reduction/resolution of right-sided pleural effusion post thoracentesis. Minimally improved aeration of the right lung base with persistent right basilar hydropneumothorax. No mediastinal shift. Minimally improved aeration the right lower lung with persistent  heterogeneous/consolidative opacities. Grossly unchanged small left-sided effusion with associated left basilar heterogeneous/consolidative opacities. No new focal airspace opacities. Mild pulmonary venous congestion. No acute osseus abnormalities. IMPRESSION: 1. Interval reduction/resolution of right-sided effusion post thoracentesis with residual right basilar pneumothorax. 2. Unchanged small left-sided  effusion with associated left basilar heterogeneous/consolidative opacities, atelectasis versus infiltrate. 3. Minimally improved aeration of the right lung base. Critical Value/emergent results were called by telephone at the time of interpretation on 04/10/2018 at 11:09 am to Dr. Posey Pronto, who verbally acknowledged these results. Electronically Signed   By: Sandi Mariscal M.D.   On: 04/10/2018 11:08   Dg Abd 1 View  Result Date: 04/10/2018 CLINICAL DATA:  Orogastric tube placement EXAM: ABDOMEN - 1 VIEW COMPARISON:  None. FINDINGS: Orogastric tube tip and side port project over the stomach. Bilateral pleural effusions with associated atelectasis. Prominent small bowel in the central abdomen. IMPRESSION: Orogastric tube tip and side port projecting over the stomach. Electronically Signed   By: Ulyses Jarred M.D.   On: 04/10/2018 14:53   Ct Chest Wo Contrast  Result Date: 03/25/2018 CLINICAL DATA:  Patient refused dialysis today and presents with abdominal pain and tachycardia. EXAM: CT CHEST, ABDOMEN AND PELVIS WITHOUT CONTRAST TECHNIQUE: Multidetector CT imaging of the chest, abdomen and pelvis was performed following the standard protocol without IV contrast. COMPARISON:  CXR 04/04/2018 FINDINGS: CT CHEST FINDINGS Cardiovascular: Cardiomegaly with three-vessel coronary arteriosclerosis. Trace pericardial effusion. Minimal aortic atherosclerosis without aneurysm. Mild dilatation of the main pulmonary artery to 3 cm may reflect a component of chronic pulmonary hypertension. Mediastinum/Nodes: No thyromegaly.  Midline patent trachea and mainstem bronchi. No adenopathy. Esophagus is unremarkable. Lungs/Pleura: Moderate left and moderate to large right pleural effusions with adjacent compressive atelectasis. Bronchiectasis to both lower lobes. Subpleural atelectasis in the lingula left lower lobe. Musculoskeletal: Thoracic spondylosis. No aggressive osseous lesions. CT ABDOMEN PELVIS FINDINGS Hepatobiliary: Markedly distended gallbladder with punctate dependent calculus noted. No wall thickening or pericholecystic fluid. The unenhanced liver is unremarkable. Portal vein is difficult to assess given lack of IV contrast there is some heterogeneity seen within. Pancreas: Atrophic without acute abnormality. Spleen: Normal Adrenals/Urinary Tract: Atrophic kidneys. No nephrolithiasis nor hydroureteronephrosis. The urinary bladder demonstrates tiny layering calculi along the left posterior wall. Stomach/Bowel: Stomach is within normal limits. Appendix appears normal. No evidence of bowel wall thickening, distention, or inflammatory changes. Vascular/Lymphatic: Nonaneurysmal moderate atherosclerosis of the aorta and branch vessels. No lymphadenopathy. Reproductive: Uterus and bilateral adnexa are unremarkable. Other: Diffuse soft tissue anasarca. Phleboliths and/or injection granulomata are identified overlying the buttocks. Musculoskeletal: No acute osseous abnormality. Lumbar spondylosis with degenerative disc disease L4-5 and L5-S1 with multilevel facet arthropathy. IMPRESSION: Chest CT: 1. Cardiomegaly with 3 vessel coronary arteriosclerosis. 2. Bilateral pleural effusions with compressive atelectasis, moderate on the left and moderate to large on the right. 3. Slight dilatation of the main pulmonary artery to 3 cm compatible with chronic pulmonary hypertension. 4. Thoracic spondylosis. CT AP: 1. Marked distention of the gallbladder without pericholecystic fluid or wall thickening. Punctate dependent calculus is identified  within. Findings may represent a hydropic gallbladder or a fasting state of the gallbladder counting for the distention. 2. Atrophic kidneys without obstructive uropathy. Findings would be in keeping with the patient's dialysis dependence. 3. Diffuse soft tissue anasarca. 4. Degenerative disc disease L4-5 and L5-S1. Electronically Signed   By: Ashley Royalty M.D.   On: 04/11/2018 23:20   Dg Chest Port 1 View  Result Date: 04/11/2018 CLINICAL DATA:  Intubation. EXAM: PORTABLE CHEST 1 VIEW COMPARISON:  04/10/2018. FINDINGS: Endotracheal tube, NG tube, right subclavian line, right chest tube in stable position. Stable cardiomegaly. Persistent bibasilar atelectasis/infiltrates and bilateral pleural effusions. Persistent right-sided pneumothorax with slight interim improvement. IMPRESSION: 1. Lines and tubes including right chest tube in stable  position. Slight improvement of right-sided pneumothorax. 2. Bibasilar atelectasis and bilateral pleural effusions, left side greater than right again noted. No significant change from prior exam. Electronically Signed   By: Marcello Moores  Register   On: 04/11/2018 07:37   Dg Chest Port 1 View  Result Date: 04/10/2018 CLINICAL DATA:  Post intubation and right-sided chest tube placement EXAM: PORTABLE CHEST 1 VIEW COMPARISON:  Earlier same day (multiple examinations) 04/08/2018; chest CT-04/09/2028 FINDINGS: Grossly unchanged cardiac silhouette and mediastinal contours with partial obscuration of the left heart border secondary to unchanged small left-sided effusion associated left basilar heterogeneous/consolidative opacities. No new focal airspace opacities. Endotracheal tube overlies the tracheal air column with tip superior to the carina. Enteric tube tip and side port project below the left hemidiaphragm. Interval placement of right subclavian vein approach central venous catheter with tip projected over the superior cavoatrial junction. Interval placement of a right lateral  chest tube with reduction/resolution of right-sided effusion and unchanged small right basilar hydropneumothorax. No mediastinal shift. No acute osseus abnormalities. Degenerative change of the right glenohumeral joint is suspected though incompletely evaluated. IMPRESSION: 1. Interval placement of right-sided chest tube with reduction/resolution of right-sided effusion post thoracentesis in grossly unchanged small right basilar hydropneumothorax. 2. Right subclavian vein approach central venous catheter tip projects over the superior cavoatrial junction. 3. Stable position of remaining support apparatus. 4. Unchanged small left-sided effusion associated left basilar opacities, atelectasis versus infiltrate. No new focal airspace opacities. Electronically Signed   By: Sandi Mariscal M.D.   On: 04/10/2018 14:47   Portable Chest X-ray  Result Date: 04/10/2018 CLINICAL DATA:  77 year old female with a history of intubation. EXAM: PORTABLE CHEST 1 VIEW COMPARISON:  04/10/2018, 02/12/2018, chest CT 04/02/2018 FINDINGS: Cardiomediastinal silhouette unchanged in size and contour, with the heart borders partially obscured by overlying lung/pleural disease. Dense opacities at the bilateral lung bases, worst on the left. Small right-sided pneumothorax persists. Interval placement of endotracheal tube which terminates approximately 3.2 cm above the carina. Interval placement of gastric tube terminating in the left upper abdomen out of the field of view. IMPRESSION: Interval placement of endotracheal tube, terminating above the carina approximately 3.2 cm. Interval placement of gastric tube. Similar appearance of right hydropneumothorax, unchanged from the prior. Dense opacity at the left base, likely a combination of atelectasis/consolidation, pleural fluid, and/or edema. Electronically Signed   By: Corrie Mckusick D.O.   On: 04/10/2018 13:23   Dg Chest Port 1 View  Result Date: 04/10/2018 CLINICAL DATA:  Post recent  right-sided thoracentesis with persistent respiratory distress. EXAM: PORTABLE CHEST 1 VIEW COMPARISON:  Earlier same day; 04/10/2018; chest CT-04/01/2018 FINDINGS: Grossly unchanged enlarged cardiac silhouette and mediastinal contours. Grossly unchanged small loculated right basilar hydropneumothorax post recent thoracentesis. No midline shift. Unchanged small left-sided effusion with associated left basilar opacities. No new focal airspace opacities. Mild pulmonary venous congestion without frank evidence of edema. No acute osseus abnormalities. No acute osseus abnormalities. IMPRESSION: 1. Grossly unchanged small loculated right basilar hydropneumothorax post recent thoracentesis. 2. Unchanged small left-sided effusion associated left basilar opacities, atelectasis versus infiltrate. 3. Pulmonary venous congestion without frank evidence of edema. Electronically Signed   By: Sandi Mariscal M.D.   On: 04/10/2018 12:25   Dg Chest Portable 1 View  Result Date: 03/27/2018 CLINICAL DATA:  Shortness of breath. Refused dialysis today. History of CHF. EXAM: PORTABLE CHEST 1 VIEW COMPARISON:  Chest radiograph Feb 12, 2018 FINDINGS: Stable cardiomegaly. Pulmonary vascular congestion. Similar moderate to large bilateral pleural effusions. Biapical pleural capping.  Prominent pleural reflection mid lung zone. No mediastinal shift. Soft tissue planes and included osseous structures are nonacute. High riding bilateral humeral heads seen with old rotator cuff injuries. IMPRESSION: 1. Moderate to large pleural effusions, possible RIGHT hydropneumothorax without mediastinal shift. Similar underlying consolidation. 2. Stable cardiomegaly and pulmonary vascular congestion. 3. Acute findings discussed with and reconfirmed by Dr.GRAYDON GOODMAN on 04/17/2018 at 9:49 pm. Electronically Signed   By: Elon Alas M.D.   On: 04/14/2018 21:49   US Abdomen Limited Ruq  Result Date: 04/11/2018 CLINICAL DATA:  Question of gallstones  on recent CT EXAM: ULTRASOUND ABDOMEN LIMITED RIGHT UPPER QUADRANT COMPARISON:  CT abdomen and pelvis April 09, 2018 FINDINGS: Gallbladder: Gallbladder appears somewhat distended with irregularities along the gallbladder wall. Sludge is seen in the gallbladder. There are no convincing echogenic foci which move and shadow as is expected with gallstones. There is no pericholecystic fluid. No sonographic Murphy sign noted by sonographer. Common bile duct: Diameter: 4 mm. No intrahepatic or extrahepatic biliary duct dilatation. Liver: No focal lesion identified. Within normal limits in parenchymal echogenicity. Portal vein is patent on color Doppler imaging with normal direction of blood flow towards the liver. Trace ascites. IMPRESSION: 1. Sludge in gallbladder. No convincing gallstones. Tiny gallstones could be obscured by sludge. The gallbladder is somewhat distended with mild irregularity along the gallbladder wall. Gallbladder wall is not frankly thickened, however. Question underlying inflammation within the gallbladder wall. This combination of findings may warrant correlation with nuclear medicine hepatobiliary imaging study to assess for cystic duct patency. 2.  Slight ascites. 3.  Study otherwise unremarkable. Electronically Signed   By: Lowella Grip III M.D.   On: 04/11/2018 08:59   US Thoracentesis Asp Pleural Space W/img Guide  Result Date: 04/10/2018 INDICATION: Symptomatic right sided pleural effusion. Note, patient has known right basilar hydropneumothorax demonstrated on preceding chest radiograph and chest CT. EXAM: US THORACENTESIS ASP PLEURAL SPACE W/IMG GUIDE COMPARISON:  Chest radiograph-04/06/2018; chest CT-04/17/2018; MEDICATIONS: None. COMPLICATIONS: SIR LEVEL B - Normal therapy, includes overnight admission for observation. Patient experienced a sudden transient decreased oxygen saturation to the level of 40s necessitating calling of a rapid response, however her oxygen saturation  levels normalized to the mid 90s with the administration of supplement total oxygen and postprocedural chest radiograph demonstrated resolution of the right-sided effusion with no change in the size of the right basilar pneumothorax and no additional intervention was required. TECHNIQUE: Informed written consent was obtained from the patient after a discussion of the risks, benefits and alternatives to treatment. A timeout was performed prior to the initiation of the procedure. With the patient positioned left lateral decubitus, initial ultrasound scanning demonstrates a moderate right-sided anechoic pleural effusion. The lower chest was prepped and draped in the usual sterile fashion. 1% lidocaine was used for local anesthesia. An ultrasound image was saved for documentation purposes. An 8 Fr Safe-T-Centesis catheter was introduced. The thoracentesis was performed. The catheter was removed and a dressing was applied. Patient experienced a sudden transient decreased oxygen saturation to the level of 40s necessitating calling of a rapid response, however her oxygen saturation levels normalized to the mid 90s with the administration of supplement total oxygen. The patient had a portable upright chest radiograph which demonstrated resolution of the right-sided effusion with no change in the size of the right basilar pneumothorax and no additional intervention was required. FINDINGS: A total of approximately 400 cc of blood-tinged fluid was removed. IMPRESSION: Successful ultrasound-guided right sided thoracentesis yielding 400 cc of  blood tinged pleural fluid. Patient experienced a sudden transient decreased oxygen saturation to the level of 40s necessitating calling of a rapid response, however her oxygen saturation levels normalized to the mid 90s with the administration of supplement total oxygen and postprocedural chest radiograph demonstrated resolution of the right-sided effusion with no change in the size of the  right basilar pneumothorax and no additional intervention was required. Electronically Signed   By: Sandi Mariscal M.D.   On: 04/10/2018 11:20     CBC Recent Labs  Lab 03/30/2018 2125 04/10/18 0417 04/10/18 1229 04/11/18 0445  WBC 7.1 6.2 11.6* 10.0  HGB 10.1* 11.0* 10.2* 9.6*  HCT 31.2* 33.6* 32.3* 29.4*  PLT 389 412 413 355  MCV 86.5 87.2 88.8 87.3  MCH 28.0 28.4 28.1 28.6  MCHC 32.4 32.6 31.6* 32.8  RDW 17.6* 18.0* 17.4* 17.1*  LYMPHSABS 1.3  --   --  0.3*  MONOABS 0.8  --   --  0.7  EOSABS 0.1  --   --  0.0  BASOSABS 0.1  --   --  0.1    Chemistries  Recent Labs  Lab 04/10/18 0001 04/10/18 0417 04/10/18 1229 04/11/18 0445  NA 141 139 138 142  K 4.8 4.7 4.7 4.9  CL 102 101 102 105  CO2 _0 GLUCOSE 113* 97 225* 143*  BUN 71* 73* 74* 77*  CREATININE 4.09* 4.18* 4.12* 4.21*  CALCIUM 8.6* 8.4* 8.2* 8.5*  MG  --   --  2.3 2.3  AST 30  --  33 31  ALT 21  --  22 19  ALKPHOS 118  --  120 105  BILITOT 0.7  --  0.7 0.7   ------------------------------------------------------------------------------------------------------------------ estimated creatinine clearance is 8.9 mL/min (A) (by C-G formula based on SCr of 4.21 mg/dL (H)). ------------------------------------------------------------------------------------------------------------------ No results for input(s): HGBA1C in the last 72 hours. ------------------------------------------------------------------------------------------------------------------ No results for input(s): CHOL, HDL, LDLCALC, TRIG, CHOLHDL, LDLDIRECT in the last 72 hours. ------------------------------------------------------------------------------------------------------------------ Recent Labs    04/10/18 1229  TSH 7.642*   ------------------------------------------------------------------------------------------------------------------ No results for input(s): VITAMINB12, FOLATE, FERRITIN, TIBC, IRON, RETICCTPCT in the last 72  hours.  Coagulation profile Recent Labs  Lab 04/11/18 0445  INR 1.13    No results for input(s): DDIMER in the last 72 hours.  Cardiac Enzymes Recent Labs  Lab 04/10/18 1229 04/10/18 1827 04/11/18 0046  TROPONINI <0.03 0.07* 0.07*   ------------------------------------------------------------------------------------------------------------------ Invalid input(s): POCBNP    Assessment & Plan  Patient is a 77 year old white female end-stage renal disease  1.  Acute respiratory failure status post thoracentesis Patient had pneumothorax now with chest tube in Continue ventilator  2.  Bilateral pleural effusion  Status post drainage  3.  End-stage renal disease hemodialysis per nephrology not be able to do it today  4.  Hypothyroidism continue Synthroid  5.  History of CHF with dialysis per nephrology  6.  CODE STATUS full code         Code Status Orders  (From admission, onward)        Start     Ordered   04/10/18 0253  Full code  Continuous     04/10/18 0252    Code Status History    Date Active Date Inactive Code Status Order ID Comments User Context   02/12/2018 1528 02/13/2018 1931 Full Code [id number]  Dustin Flock, MD ED   01/10/2018 1536 01/13/2018 0045 Full Code [id number]  Loletha Grayer, MD ED   10/19/2017 1310 10/20/2017  2106 Full Code [id number]  Bettey Costa, MD Inpatient   08/17/2017 2122 08/22/2017 2014 DNR [id number]  Loletha Grayer, MD ED   08/14/2017 0459 08/15/2017 1941 Full Code [id number]  Harrie Foreman, MD Inpatient           Consults intensivist, nephrology  DVT Prophylaxis heparin  Lab Results  Component Value Date   PLT 355 04/11/2018     Time Spent in minutes   35 minutes critical care time spent  Greater than 50% of time spent in care coordination and counseling patient regarding the condition and plan of care.   Dustin Flock M.D on 04/11/2018 at 3:18 PM  Between 7am to 6pm - Pager - [phone]  After  6pm go to [email or web address] - Proofreader  [employer]   Office  [phone]

## 2018-04-12 ENCOUNTER — Inpatient Hospital Stay: Payer: Medicare Other

## 2018-04-12 DIAGNOSIS — Z7189 Other specified counseling: Secondary | ICD-10-CM

## 2018-04-12 DIAGNOSIS — Z515 Encounter for palliative care: Secondary | ICD-10-CM

## 2018-04-12 LAB — BLOOD GAS, ARTERIAL
ACID-BASE EXCESS: 2.7 mmol/L — AB (ref 0.0–2.0)
Acid-Base Excess: 5.2 mmol/L — ABNORMAL HIGH (ref 0.0–2.0)
Bicarbonate: 28.8 mmol/L — ABNORMAL HIGH (ref 20.0–28.0)
Bicarbonate: 32.5 mmol/L — ABNORMAL HIGH (ref 20.0–28.0)
FIO2: 0.3
FIO2: 0.3
MECHVT: 400 mL
O2 SAT: 97.3 %
O2 SAT: 95.4 %
PATIENT TEMPERATURE: 36.7
PATIENT TEMPERATURE: 37
PCO2 ART: 51 mmHg — AB (ref 32.0–48.0)
PEEP: 5 cmH2O
PH ART: 7.36 (ref 7.350–7.450)
PO2 ART: 95 mmHg (ref 83.0–108.0)
RATE: 16 resp/min
pCO2 arterial: 63 mmHg — ABNORMAL HIGH (ref 32.0–48.0)
pH, Arterial: 7.32 — ABNORMAL LOW (ref 7.350–7.450)
pO2, Arterial: 84 mmHg (ref 83.0–108.0)

## 2018-04-12 LAB — T4, FREE: Free T4: 1.22 ng/dL (ref 0.82–1.77)

## 2018-04-12 LAB — CBC WITH DIFFERENTIAL/PLATELET
BASOS ABS: 0 10*3/uL (ref 0–0.1)
BASOS PCT: 0 %
EOS ABS: 0 10*3/uL (ref 0–0.7)
Eosinophils Relative: 0 %
HEMATOCRIT: 27.6 % — AB (ref 35.0–47.0)
Hemoglobin: 8.9 g/dL — ABNORMAL LOW (ref 12.0–16.0)
Lymphocytes Relative: 6 %
Lymphs Abs: 0.7 10*3/uL — ABNORMAL LOW (ref 1.0–3.6)
MCH: 28.2 pg (ref 26.0–34.0)
MCHC: 32.1 g/dL (ref 32.0–36.0)
MCV: 87.8 fL (ref 80.0–100.0)
MONO ABS: 0.9 10*3/uL (ref 0.2–0.9)
Monocytes Relative: 7 %
NEUTROS ABS: 10.4 10*3/uL — AB (ref 1.4–6.5)
Neutrophils Relative %: 87 %
PLATELETS: 261 10*3/uL (ref 150–440)
RBC: 3.14 MIL/uL — ABNORMAL LOW (ref 3.80–5.20)
RDW: 17.7 % — ABNORMAL HIGH (ref 11.5–14.5)
WBC: 12 10*3/uL — ABNORMAL HIGH (ref 3.6–11.0)

## 2018-04-12 LAB — COMPREHENSIVE METABOLIC PANEL
ALBUMIN: 2.7 g/dL — AB (ref 3.5–5.0)
ALT: 20 U/L (ref 0–44)
ANION GAP: 9 (ref 5–15)
AST: 32 U/L (ref 15–41)
Alkaline Phosphatase: 102 U/L (ref 38–126)
BILIRUBIN TOTAL: 0.7 mg/dL (ref 0.3–1.2)
BUN: 53 mg/dL — AB (ref 8–23)
CHLORIDE: 104 mmol/L (ref 98–111)
CO2: 29 mmol/L (ref 22–32)
Calcium: 8.2 mg/dL — ABNORMAL LOW (ref 8.9–10.3)
Creatinine, Ser: 3.24 mg/dL — ABNORMAL HIGH (ref 0.44–1.00)
GFR calc Af Amer: 15 mL/min — ABNORMAL LOW (ref >60)
GFR calc non Af Amer: 13 mL/min — ABNORMAL LOW (ref >60)
Glucose, Bld: 108 mg/dL — ABNORMAL HIGH (ref 70–99)
POTASSIUM: 4.5 mmol/L (ref 3.5–5.1)
SODIUM: 142 mmol/L (ref 135–145)
Total Protein: 6.4 g/dL — ABNORMAL LOW (ref 6.5–8.1)

## 2018-04-12 LAB — GLUCOSE, CAPILLARY
GLUCOSE-CAPILLARY: 104 mg/dL — AB (ref 70–99)
GLUCOSE-CAPILLARY: 107 mg/dL — AB (ref 70–99)
GLUCOSE-CAPILLARY: 112 mg/dL — AB (ref 70–99)
GLUCOSE-CAPILLARY: 113 mg/dL — AB (ref 70–99)
Glucose-Capillary: 105 mg/dL — ABNORMAL HIGH (ref 70–99)
Glucose-Capillary: 108 mg/dL — ABNORMAL HIGH (ref 70–99)
Glucose-Capillary: 111 mg/dL — ABNORMAL HIGH (ref 70–99)

## 2018-04-12 LAB — PROTIME-INR
INR: 1.12
Prothrombin Time: 14.3 seconds (ref 11.4–15.2)

## 2018-04-12 LAB — CYTOLOGY - NON PAP

## 2018-04-12 LAB — PHOSPHORUS: PHOSPHORUS: 4.1 mg/dL (ref 2.5–4.6)

## 2018-04-12 LAB — MAGNESIUM: MAGNESIUM: 1.9 mg/dL (ref 1.7–2.4)

## 2018-04-12 MED ORDER — PENTAFLUOROPROP-TETRAFLUOROETH EX AERO
INHALATION_SPRAY | CUTANEOUS | Status: DC | PRN
  Administered 2018-04-12: 30 via TOPICAL
  Filled 2018-04-12 (×2): qty 30

## 2018-04-12 MED ORDER — LEVOFLOXACIN IN D5W 500 MG/100ML IV SOLN
500.0000 mg | INTRAVENOUS | Status: DC
  Administered 2018-04-13: 500 mg via INTRAVENOUS
  Filled 2018-04-12 (×2): qty 100

## 2018-04-12 MED ORDER — MAGNESIUM SULFATE IN D5W 1-5 GM/100ML-% IV SOLN
1.0000 g | Freq: Once | INTRAVENOUS | Status: AC
  Administered 2018-04-12: 1 g via INTRAVENOUS
  Filled 2018-04-12 (×2): qty 100

## 2018-04-12 MED ORDER — HEPARIN SODIUM (PORCINE) 1000 UNIT/ML DIALYSIS
20.0000 [IU]/kg | INTRAMUSCULAR | Status: DC | PRN
  Administered 2018-04-12: 1000 [IU] via INTRAVENOUS_CENTRAL

## 2018-04-12 NOTE — Consult Note (Signed)
Consultation Note Date: 04/12/2018   Patient Name: Ann Cunningham  DOB: 02/06/1941  MRN: [id number]  Age / Sex: 77 y.o., female  PCP: [facility] [place] Referring Physician: Dustin Flock, MD  Reason for Consultation: Establishing goals of care  HPI/Patient Profile: 78 y.o. female  with past medical history of ESRD on HD, cardiomyopathy (EF 30-35%), chronic pleural effusions, HTN, glaucoma, and blindness  admitted on 04/12/2018 with shortness of breath, abdominal pain, and missed HD appts. Patient found to have hypotension and shock. On 7/17 patient had hypoxia post thoracentesis and patient was intubated, had central line and chest tube placed. Per pulmonology, patient may have trapped lung and thoracentesis resulted in fluid shift and worsening respiratory failure. Patient has failed SBT mornings of 7/18 and 7/19. Also found to have enlarged gallbladder on CT - plan for HIDA after extubation. Noted that patient has had 4 admissions in last 6 months - appear to be d/t noncompliance with HD. PMT consulted by nephrology.   Clinical Assessment and Goals of Care: I have reviewed medical records including EPIC notes, labs and imaging, received report from RN and MD, assessed the patient and then met at the bedside along with patient's niece, [name], and nephew, Konrad Dolores, to discuss diagnosis prognosis, GOC, EOL wishes, disposition and options. We also called patient's other niece, Olegario Shearer, who is in [place] to include her in decision making.   Patient unable to participate in [place] d/t intubation and AMS. Family tells me baseline patient has some cognitive deficits, easily confused. Unsure of decision making abilities.   I introduced Palliative Medicine as specialized medical care for people living with serious illness. It focuses on providing relief from the symptoms and stress of a serious illness. The goal is to improve quality of life for both the  patient and the family.  We discussed a brief life review of the patient. Pt has lived in [place] since Nov. She is unmarried - does have significant other for over 40 years however he was unable to attend [place] meeting because he is also ill - [name] tells me she will update him with conversation. Pt had a son who has passed away. Therefore, her nieces and nephew are her closest family members and serve as surrogate decision makers when patient is unable to speak for herself. Per family, patient does not have documented HCPOA and I could not find any HCPOA documents in chart.    As far as functional and nutritional status, they tell me patient has poor functional and nutritional status. She has been wheelchair bound for many years; but over the past several months she has been essentially bed bound - only gets out of bed to wheelchair for HD appt. Dependent on others for ADLs. Poor appetite and significant weight loss. They also speak of a cognitive decline - tell me she is frequently confused.    We discussed patient's current illness and what it means in the larger context of patient's on-going co-morbidities.  Natural disease trajectory and expectations at EOL were discussed. We discussed patients respiratory issues and current dependence on mechanical ventilation. We discussed that patient has so far failed weaning trials. Hopeful she is able to be extubated in coming days. We also discussed patient's ESRD and noncompliance with HD. Family tells me she is tired of HD and they feel that she may not want to continue with HD.   The difference between aggressive medical intervention and comfort care was considered in light of the patient's goals of  care. Family is concerned about patient's comfort - we discussed continuing current care over the weekend and reevaluating next week. If patient continues to fail SBT may elect to transition to comfort care.   Advanced directives, concepts specific to code status,  artifical feeding and hydration, and rehospitalization were considered and discussed. Family tells me that patient has told them she would not want to be resuscitated and would not want to be on a ventilator. We discussed full code vs DNR - all family members agree patient should be DNR. Confirmed with Vicky over phone. Continue with current care and mechanical ventilation but if she were to cardiac arrest we should allow her to pass naturally.   Hospice and Palliative Care services outpatient were explained and offered. We discussed that if the patient is tired of HD and does not want to continue with HD that she would be eligible for hospice care. They feel that may be the best plan for patient but are hopeful she is able to be extubated and share her wishes with [place].   Questions and concerns were addressed. The family was encouraged to call with questions or concerns.    Primary Decision Maker NEXT OF KIN - for now, nieces and nephew, Herbert Pun, and Tommy - does not have spouse/children - no other family available, no documented HCPOA - patient may be able to make decisions if able to be extubated and awake  SUMMARY OF RECOMMENDATIONS   - continue current care with HD and mechanical ventilation, hopeful patient can be extubated in the next few days -code status changed to DNR per family discussion about patient wishes - Planned with family to meet again next Tues (one niece, Olegario Shearer, is out of town until [place] and if decision is made to withdraw care she would want to be present) -  If patient still requiring ventilator support and not improving we discussed transitioning to comfort care - If patient able to be extubated, will follow up with patient regarding care moving forward - family indicates she may not want to continue with HD anymore and may be interested in Hospice support  Code Status/Advance Care Planning:  DNR  Symptom Management:   Per primary  Additional Recommendations  (Limitations, Scope, Preferences):  No Tracheostomy  Psycho-social/Spiritual:   Desire for further Chaplaincy support:no  Additional Recommendations: Education on Hospice  Prognosis:   Unable to determine - depends on clinical progress possibly residential hospice if decision made to d/c HD  Discharge Planning: To Be Determined      Primary Diagnoses: Present on Admission: . Bilateral pleural effusion . Hypothyroidism . Acute on chronic combined systolic and diastolic CHF (congestive heart failure) ([place]) . Acute respiratory failure ([place])   I have reviewed the medical record, interviewed the patient and family, and examined the patient. The following aspects are pertinent.  Past Medical History:  Diagnosis Date  . Anemia   . Cardiomyopathy ([place])   . CHF (congestive heart failure) ([place])   . CKD (chronic kidney disease)   . Glaucoma   . Hypertension   . Hypothyroidism   . Pressure ulcer of sacrum   . Renal dialysis device, implant, or graft complication   . Renal disorder   . Uses walker    Social History   Socioeconomic History  . Marital status: Single    Spouse name: Not on file  . Number of children: Not on file  . Years of education: Not on file  . Highest education level: Not  on file  Occupational History  . Not on file  Social Needs  . Financial resource strain: Patient refused  . Food insecurity:    Worry: Patient refused    Inability: Patient refused  . Transportation needs:    Medical: Patient refused    Non-medical: Patient refused  Tobacco Use  . Smoking status: Former Smoker    Packs/day: 0.50    Types: Cigarettes    Last attempt to quit: 06/14/2017    Years since quitting: 0.8  . Smokeless tobacco: Former Systems developer    Types: Snuff  Substance and Sexual Activity  . Alcohol use: No  . Drug use: No  . Sexual activity: Not on file  Lifestyle  . Physical activity:    Days per week: Patient refused    Minutes per session: Patient refused  .  Stress: Patient refused  Relationships  . Social connections:    Talks on phone: Patient refused    Gets together: Patient refused    Attends religious service: Patient refused    Active member of club or organization: Patient refused    Attends meetings of clubs or organizations: Patient refused    Relationship status: Patient refused  Other Topics Concern  . Not on file  Social History Narrative  . Not on file   Family History  Problem Relation Age of Onset  . Hypertension Mother   . CVA Mother   . CAD Father    Scheduled Meds: . aspirin  81 mg Per Tube Daily  . B-complex with vitamin C  1 tablet Per Tube QHS  . chlorhexidine gluconate (MEDLINE KIT)  15 mL Mouth Rinse BID  . epoetin (EPOGEN/PROCRIT) injection  10,000 Units Intravenous Q T,Th,Sa-HD  . famotidine  20 mg Per Tube Daily  . heparin  5,000 Units Subcutaneous Q8H  . ipratropium-albuterol  3 mL Nebulization Q6H  . latanoprost  1 drop Both Eyes QHS  . levothyroxine  25 mcg Intravenous Daily  . mouth rinse  15 mL Mouth Rinse 10 times per day  . mirtazapine  7.5 mg Oral QHS  . sodium chloride flush  10-40 mL Intracatheter Q12H   Continuous Infusions: . dexmedetomidine (PRECEDEX) IV infusion 0.199 mcg/kg/hr (04/12/18 0600)  . feeding supplement (VITAL AF 1.2 CAL) 45 mL/hr at 04/12/18 0600  . [START ON 04/13/2018] levofloxacin (LEVAQUIN) IV    . norepinephrine (LEVOPHED) Adult infusion Stopped (04/11/18 1634)  . vancomycin Stopped (04/11/18 1405)   PRN Meds:.acetaminophen **OR** acetaminophen, docusate, fentaNYL (SUBLIMAZE) injection, heparin, ondansetron **OR** ondansetron (ZOFRAN) IV, pentafluoroprop-tetrafluoroeth, pentafluoroprop-tetrafluoroeth, sodium chloride flush Allergies  Allergen Reactions  . Penicillins Other (See Comments)    Has patient had a PCN reaction causing immediate rash, facial/tongue/throat swelling, SOB or lightheadedness with hypotension: Unknown Has patient had a PCN reaction causing severe  rash involving mucus membranes or skin necrosis: Unknown Has patient had a PCN reaction that required hospitalization: Unknown Has patient had a PCN reaction occurring within the last 10 years: Unknown If all of the above answers are "NO", then may proceed with Cephalosporin use.    Review of Systems  Unable to perform ROS: Intubated    Physical Exam  Constitutional: She has a sickly appearance. No distress. She is sedated and intubated.  HENT:  Head: Normocephalic and atraumatic.  Cardiovascular: Regular rhythm. Tachycardia present.  Pulmonary/Chest: Breath sounds normal. She is intubated.  Abdominal: Soft. Bowel sounds are normal.  Neurological: She is disoriented.  sedated  Skin: Skin is warm and dry.  Vital Signs: BP 91/61   Pulse (!) 114   Temp 99.3 F (37.4 C)   Resp (!) 21   Ht 5' 2"  (1.575 m)   Wt 107 lb 5.8 oz (48.7 kg)   SpO2 100%   BMI 19.64 kg/m  Pain Scale: CPOT   Pain Score: 0-No pain   SpO2: SpO2: 100 % O2 Device:SpO2: 100 % O2 Flow Rate: .O2 Flow Rate (L/min): 3 L/min  IO: Intake/output summary:   Intake/Output Summary (Last 24 hours) at 04/12/2018 1354 Last data filed at 04/12/2018 1035 Gross per 24 hour  Intake 1213.18 ml  Output 663 ml  Net 550.18 ml    LBM: Last BM Date: 04/22/2018 Baseline Weight: Weight: 125 lb (56.7 kg) Most recent weight: Weight: 107 lb 5.8 oz (48.7 kg)     Palliative Assessment/Data: PPS 20%     Time In: 1420 Time Out: 1600 Time Total: 100 minutes Greater than 50%  of this time was spent counseling and coordinating care related to the above assessment and plan.  Juel Burrow, DNP, AGNP-C Palliative Medicine Team [phone] Pager: [phone]

## 2018-04-12 NOTE — Progress Notes (Signed)
Pre HD Tx    04/12/18 0730  Hand-Off documentation  Report given to (Full Name) Laretta Alstromoya Kimblery Diop, RN   Report received from (Full Name) Alberteen SpindleAshly Gunter, RN   Vital Signs  Temp 98.4 F (36.9 C)  Temp Source Core  Pulse Rate (!) 124  Pulse Rate Source Monitor  Resp (!) 26  BP 110/70  BP Location Left Arm  BP Method Automatic  Patient Position (if appropriate) Lying  Oxygen Therapy  SpO2 100 %  O2 Device Ventilator  End Tidal CO2 (EtCO2) 51  Pain Assessment  Pain Scale 0-10  Pain Score 0  Dialysis Weight  Weight 49.1 kg (108 lb 3.9 oz)  Type of Weight Pre-Dialysis  Time-Out for Hemodialysis  What Procedure? HD  Pt Identifiers(min of two) First/Last Name;MRN/Account#  Correct Site? Yes  Correct Side? Yes  Correct Procedure? Yes  Consents Verified? Yes  Rad Studies Available? N/A  Safety Precautions Reviewed? Yes  Biochemist, clinicalMachine Checks  Machine Number [phone] [id number]  Station Number  (Bedside ICU )  UF/Alarm Test Passed  Conductivity: Meter 14  Conductivity: Machine  13.8  pH 7.4  Reverse Osmosis Portable   Normal Saline Lot Number [id number]  Dialyzer Lot Number [id number]  Disposable Set Lot Number [id number]  Machine Temperature 98.6 F (37 C)  ImmunologistAir Detector Armed and Audible Yes  Blood Lines Intact and Secured Yes  Pre Treatment Patient Checks  Vascular access used during treatment Fistula  Hepatitis B Surface Antigen Results Negative (unknown)  Isolation Initiated Yes  Hemodialysis Consent Verified Yes  Hemodialysis Standing Orders Initiated Yes  ECG (Telemetry) Monitor On Yes  Prime Ordered Normal Saline  Length of  DialysisTreatment -hour(s) 3 Hour(s)  Dialysis Treatment Comments Na 140  Dialyzer Elisio 17H NR  Dialysate 3K, 2.5 Ca  Dialysis Anticoagulant Heparin  Dialysate Flow Ordered 600  Blood Flow Rate Ordered 300 mL/min  Ultrafiltration Goal 0.5 Liters  Pre Treatment Labs Renal panel;CBC  Dialysis Blood Pressure Support Ordered Normal Saline  Education / Care Plan   Dialysis Education Provided Yes  Documented Education in Care Plan Yes  Fistula / Graft Right Forearm Arteriovenous fistula  Placement Date/Time: 10/19/17 0850   Placed prior to admission: No  Orientation: Right  Access Location: Forearm  Access Type: Arteriovenous fistula  Site Condition No complications  Fistula / Graft Assessment Present;Thrill;Bruit  Status Patent

## 2018-04-12 NOTE — Progress Notes (Signed)
HD Tx completed    04/12/18 1030  Vital Signs  Temp 98.2 F (36.8 C)  Pulse Rate (!) 116  Resp (!) 22  BP (!) 107/58  Oxygen Therapy  SpO2 99 %  End Tidal CO2 (EtCO2) 45  During Hemodialysis Assessment  HD Safety Checks Performed Yes  Dialysis Fluid Bolus Normal Saline  Bolus Amount (mL) 250 mL  Intra-Hemodialysis Comments Tx completed;Tolerated well

## 2018-04-12 NOTE — Progress Notes (Signed)
[place] at [facility]                                                                                                                                                                                  Patient Demographics   Ann Cunningham, is a 77 y.o. female, DOB - September 26, 1940, WNU:[id number]  Admit date - 04/02/2018   Admitting Physician Dustin Flock, MD  Outpatient Primary MD for the patient is [facility]   LOS - 2  Subjective:  Chest tube in place, continue ventilator  Review of Systems:   CONSTITUTIONAL: Sedated  Vitals:   Vitals:   04/12/18 1200 04/12/18 1300 04/12/18 1400 04/12/18 1500  BP: (!) 102/57 91/61 96/63  (!) 95/58  Pulse: (!) 114  (!) 117 (!) 114  Resp: (!) 23 (!) 21 (!) 25 (!) 23  Temp: 99 F (37.2 C) 99.3 F (37.4 C) 99.5 F (37.5 C) 99.1 F (37.3 C)  TempSrc: Core     SpO2: 100%  100% 100%  Weight:      Height:        Wt Readings from Last 3 Encounters:  04/12/18 48.7 kg (107 lb 5.8 oz)  02/13/18 57.4 kg (126 lb 9.6 oz)  02/04/18 60.3 kg (133 lb)     Intake/Output Summary (Last 24 hours) at 04/12/2018 1516 Last data filed at 04/12/2018 1400 Gross per 24 hour  Intake 1650.68 ml  Output 663 ml  Net 987.68 ml    Physical Exam:   GENERAL: Patient critically ill intubated HEAD, EYES, EARS, NOSE AND THROAT: Atraumatic, normocephalic. Extraocular muscles are intact. Pupils equal and reactive to light. Sclerae anicteric. No conjunctival injection. No oro-pharyngeal erythema.  NECK: Supple. There is no jugular venous distention. No bruits, no lymphadenopathy, no thyromegaly.  HEART: Regular rate and rhythm,. No murmurs, no rubs, no clicks.  LUNGS: Diminished breath sounds ABDOMEN: Soft, flat, nontender, nondistended. Has good bowel sounds. No hepatosplenomegaly appreciated.  EXTREMITIES: No evidence of any cyanosis, clubbing, or peripheral edema.  +2 pedal and radial pulses bilaterally.  NEUROLOGIC:  The patient is alert, awake, and oriented x3 with no focal motor or sensory deficits appreciated bilaterally.  SKIN: Moist and warm with no rashes appreciated.  Psych: Not anxious, depressed LN: No inguinal LN enlargement    Antibiotics   Anti-infectives (From admission, onward)   Start     Dose/Rate Route Frequency Ordered Stop   04/13/18 1800  levofloxacin (LEVAQUIN) IVPB 500 mg     500 mg 100 mL/hr over 60 Minutes Intravenous Every 48 hours 04/12/18 0958     04/11/18 1300  levofloxacin (LEVAQUIN) IVPB 500 mg  Status:  Discontinued  500 mg 100 mL/hr over 60 Minutes Intravenous Daily-1800 04/11/18 1242 04/12/18 0958   04/11/18 1200  vancomycin (VANCOCIN) 500 mg in sodium chloride 0.9 % 100 mL IVPB     500 mg 100 mL/hr over 60 Minutes Intravenous Every T-Th-Sa (Hemodialysis) 04/10/18 1606     04/10/18 1545  vancomycin (VANCOCIN) 1,250 mg in sodium chloride 0.9 % 250 mL IVPB     1,250 mg 166.7 mL/hr over 90 Minutes Intravenous  Once 04/10/18 1543 04/10/18 1915      Medications   Scheduled Meds: . aspirin  81 mg Per Tube Daily  . B-complex with vitamin C  1 tablet Per Tube QHS  . chlorhexidine gluconate (MEDLINE KIT)  15 mL Mouth Rinse BID  . epoetin (EPOGEN/PROCRIT) injection  10,000 Units Intravenous Q T,Th,Sa-HD  . famotidine  20 mg Per Tube Daily  . heparin  5,000 Units Subcutaneous Q8H  . ipratropium-albuterol  3 mL Nebulization Q6H  . latanoprost  1 drop Both Eyes QHS  . levothyroxine  25 mcg Intravenous Daily  . mouth rinse  15 mL Mouth Rinse 10 times per day  . mirtazapine  7.5 mg Oral QHS  . sodium chloride flush  10-40 mL Intracatheter Q12H   Continuous Infusions: . dexmedetomidine (PRECEDEX) IV infusion 0.199 mcg/kg/hr (04/12/18 1400)  . feeding supplement (VITAL AF 1.2 CAL) 45 mL/hr at 04/12/18 1400  . [START ON 04/13/2018] levofloxacin (LEVAQUIN) IV    . norepinephrine (LEVOPHED) Adult infusion Stopped (04/11/18 1634)  . vancomycin Stopped (04/11/18 1405)    PRN Meds:.acetaminophen **OR** acetaminophen, docusate, fentaNYL (SUBLIMAZE) injection, heparin, ondansetron **OR** ondansetron (ZOFRAN) IV, pentafluoroprop-tetrafluoroeth, pentafluoroprop-tetrafluoroeth, sodium chloride flush   Data Review:   Micro Results Recent Results (from the past 240 hour(s))  MRSA PCR Screening     Status: None   Collection Time: 04/10/18  3:02 AM  Result Value Ref Range Status   MRSA by PCR NEGATIVE NEGATIVE Final    Comment:        The GeneXpert MRSA Assay (FDA approved for NASAL specimens only), is one component of a comprehensive MRSA colonization surveillance program. It is not intended to diagnose MRSA infection nor to guide or monitor treatment for MRSA infections. Performed at [facility], [address] [place], [place] [address]   CULTURE, BLOOD (ROUTINE X 2) w Reflex to ID Panel     Status: None (Preliminary result)   Collection Time: 04/10/18 12:29 PM  Result Value Ref Range Status   Specimen Description BLOOD L HAND  Final   Special Requests   Final    BOTTLES DRAWN AEROBIC AND ANAEROBIC Blood Culture adequate volume   Culture   Final    NO GROWTH 2 DAYS Performed at [facility], [address] [place], [place] [address]    Report Status PENDING  Incomplete  Body fluid culture with gram stain     Status: None (Preliminary result)   Collection Time: 04/10/18 12:40 PM  Result Value Ref Range Status   Specimen Description   Final    PLEURAL Performed at [facility], [address] [place], [place] [address]    Special Requests   Final    PLEURAL Performed at [facility], [address] [place], [place] [address]    Gram Stain   Final    ABUNDANT WBC PRESENT, PREDOMINANTLY MONONUCLEAR NO ORGANISMS SEEN    Culture   Final    NO GROWTH 2 DAYS Performed at [facility], [place]  [address], [place], [place] [address]    Report Status PENDING  Incomplete  CULTURE, BLOOD (ROUTINE X  2) w Reflex to ID Panel     Status: None (Preliminary result)   Collection Time: 04/10/18  3:09 PM  Result Value Ref Range Status   Specimen Description BLOOD LT HAND  Final   Special Requests   Final    BOTTLES DRAWN AEROBIC AND ANAEROBIC Blood Culture adequate volume   Culture   Final    NO GROWTH 2 DAYS Performed at [facility], [address] [place],  [address]    Report Status PENDING  Incomplete    Radiology Reports Ct Abdomen Pelvis Wo Contrast  Result Date: 04/08/2018 CLINICAL DATA:  Patient refused dialysis today and presents with abdominal pain and tachycardia. EXAM: CT CHEST, ABDOMEN AND PELVIS WITHOUT CONTRAST TECHNIQUE: Multidetector CT imaging of the chest, abdomen and pelvis was performed following the standard protocol without IV contrast. COMPARISON:  CXR 04/04/2018 FINDINGS: CT CHEST FINDINGS Cardiovascular: Cardiomegaly with three-vessel coronary arteriosclerosis. Trace pericardial effusion. Minimal aortic atherosclerosis without aneurysm. Mild dilatation of the main pulmonary artery to 3 cm may reflect a component of chronic pulmonary hypertension. Mediastinum/Nodes: No thyromegaly. Midline patent trachea and mainstem bronchi. No adenopathy. Esophagus is unremarkable. Lungs/Pleura: Moderate left and moderate to large right pleural effusions with adjacent compressive atelectasis. Bronchiectasis to both lower lobes. Subpleural atelectasis in the lingula left lower lobe. Musculoskeletal: Thoracic spondylosis. No aggressive osseous lesions. CT ABDOMEN PELVIS FINDINGS Hepatobiliary: Markedly distended gallbladder with punctate dependent calculus noted. No wall thickening or pericholecystic fluid. The unenhanced liver is unremarkable. Portal vein is difficult to assess given lack of IV contrast there is some heterogeneity seen within. Pancreas: Atrophic without acute abnormality. Spleen: Normal Adrenals/Urinary Tract: Atrophic kidneys. No nephrolithiasis nor  hydroureteronephrosis. The urinary bladder demonstrates tiny layering calculi along the left posterior wall. Stomach/Bowel: Stomach is within normal limits. Appendix appears normal. No evidence of bowel wall thickening, distention, or inflammatory changes. Vascular/Lymphatic: Nonaneurysmal moderate atherosclerosis of the aorta and branch vessels. No lymphadenopathy. Reproductive: Uterus and bilateral adnexa are unremarkable. Other: Diffuse soft tissue anasarca. Phleboliths and/or injection granulomata are identified overlying the buttocks. Musculoskeletal: No acute osseous abnormality. Lumbar spondylosis with degenerative disc disease L4-5 and L5-S1 with multilevel facet arthropathy. IMPRESSION: Chest CT: 1. Cardiomegaly with 3 vessel coronary arteriosclerosis. 2. Bilateral pleural effusions with compressive atelectasis, moderate on the left and moderate to large on the right. 3. Slight dilatation of the main pulmonary artery to 3 cm compatible with chronic pulmonary hypertension. 4. Thoracic spondylosis. CT AP: 1. Marked distention of the gallbladder without pericholecystic fluid or wall thickening. Punctate dependent calculus is identified within. Findings may represent a hydropic gallbladder or a fasting state of the gallbladder counting for the distention. 2. Atrophic kidneys without obstructive uropathy. Findings would be in keeping with the patient's dialysis dependence. 3. Diffuse soft tissue anasarca. 4. Degenerative disc disease L4-5 and L5-S1. Electronically Signed   By: Ashley Royalty M.D.   On: 03/30/2018 23:20   Dg Chest 1 View  Result Date: 04/12/2018 CLINICAL DATA:  77 year old female with shortness of breath. EXAM: CHEST  1 VIEW COMPARISON:  Multiple priors, most recently chest x-ray 04/11/2018. FINDINGS: An endotracheal tube is in place with tip 2.6 cm above the carina. Nasogastric tube extends into the proximal stomach with side port just distal to the gastroesophageal junction. Right subclavian  central venous catheter with tip terminating at the superior cavoatrial junction. Right-sided small bore chest tube in place  with tip in the mid right hemithorax. Small residual right pneumothorax remains (predominantly in the right mid to lower hemithorax), decreased in size compared to yesterday's examination. Lung volumes are low. Elevation of left hemidiaphragm. Small left pleural effusion. Left basilar opacity may reflect associated atelectasis and/or consolidation. No evidence of pulmonary edema. Heart size is mildly enlarged. The patient is rotated to the left on today's exam, resulting in distortion of the mediastinal contours and reduced diagnostic sensitivity and specificity for mediastinal pathology. IMPRESSION: 1. Support apparatus, as above. 2. Decreasing small residual right pneumothorax with right-sided chest tube stable in position, as above. 3. Low lung volumes with atelectasis and/or consolidation in the left lower lobe and small left pleural effusion. Electronically Signed   By: Vinnie Langton M.D.   On: 04/12/2018 08:17   Dg Chest 1 View  Result Date: 04/10/2018 CLINICAL DATA:  Post right-sided thoracentesis. Known right-sided pneumothorax. EXAM: CHEST  1 VIEW COMPARISON:  Chest radiograph-04/10/2018; 02/12/2018; chest CT-04/17/2018 FINDINGS: Grossly unchanged enlarged cardiac silhouette and mediastinal contours. Interval reduction/resolution of right-sided pleural effusion post thoracentesis. Minimally improved aeration of the right lung base with persistent right basilar hydropneumothorax. No mediastinal shift. Minimally improved aeration the right lower lung with persistent heterogeneous/consolidative opacities. Grossly unchanged small left-sided effusion with associated left basilar heterogeneous/consolidative opacities. No new focal airspace opacities. Mild pulmonary venous congestion. No acute osseus abnormalities. IMPRESSION: 1. Interval reduction/resolution of right-sided effusion  post thoracentesis with residual right basilar pneumothorax. 2. Unchanged small left-sided effusion with associated left basilar heterogeneous/consolidative opacities, atelectasis versus infiltrate. 3. Minimally improved aeration of the right lung base. Critical Value/emergent results were called by telephone at the time of interpretation on 04/10/2018 at 11:09 am to Dr. Posey Pronto, who verbally acknowledged these results. Electronically Signed   By: Sandi Mariscal M.D.   On: 04/10/2018 11:08   Dg Abd 1 View  Result Date: 04/10/2018 CLINICAL DATA:  Orogastric tube placement EXAM: ABDOMEN - 1 VIEW COMPARISON:  None. FINDINGS: Orogastric tube tip and side port project over the stomach. Bilateral pleural effusions with associated atelectasis. Prominent small bowel in the central abdomen. IMPRESSION: Orogastric tube tip and side port projecting over the stomach. Electronically Signed   By: Ulyses Jarred M.D.   On: 04/10/2018 14:53   Ct Chest Wo Contrast  Result Date: 04/21/2018 CLINICAL DATA:  Patient refused dialysis today and presents with abdominal pain and tachycardia. EXAM: CT CHEST, ABDOMEN AND PELVIS WITHOUT CONTRAST TECHNIQUE: Multidetector CT imaging of the chest, abdomen and pelvis was performed following the standard protocol without IV contrast. COMPARISON:  CXR 04/10/2018 FINDINGS: CT CHEST FINDINGS Cardiovascular: Cardiomegaly with three-vessel coronary arteriosclerosis. Trace pericardial effusion. Minimal aortic atherosclerosis without aneurysm. Mild dilatation of the main pulmonary artery to 3 cm may reflect a component of chronic pulmonary hypertension. Mediastinum/Nodes: No thyromegaly. Midline patent trachea and mainstem bronchi. No adenopathy. Esophagus is unremarkable. Lungs/Pleura: Moderate left and moderate to large right pleural effusions with adjacent compressive atelectasis. Bronchiectasis to both lower lobes. Subpleural atelectasis in the lingula left lower lobe. Musculoskeletal: Thoracic  spondylosis. No aggressive osseous lesions. CT ABDOMEN PELVIS FINDINGS Hepatobiliary: Markedly distended gallbladder with punctate dependent calculus noted. No wall thickening or pericholecystic fluid. The unenhanced liver is unremarkable. Portal vein is difficult to assess given lack of IV contrast there is some heterogeneity seen within. Pancreas: Atrophic without acute abnormality. Spleen: Normal Adrenals/Urinary Tract: Atrophic kidneys. No nephrolithiasis nor hydroureteronephrosis. The urinary bladder demonstrates tiny layering calculi along the left posterior wall. Stomach/Bowel: Stomach is within normal limits.  Appendix appears normal. No evidence of bowel wall thickening, distention, or inflammatory changes. Vascular/Lymphatic: Nonaneurysmal moderate atherosclerosis of the aorta and branch vessels. No lymphadenopathy. Reproductive: Uterus and bilateral adnexa are unremarkable. Other: Diffuse soft tissue anasarca. Phleboliths and/or injection granulomata are identified overlying the buttocks. Musculoskeletal: No acute osseous abnormality. Lumbar spondylosis with degenerative disc disease L4-5 and L5-S1 with multilevel facet arthropathy. IMPRESSION: Chest CT: 1. Cardiomegaly with 3 vessel coronary arteriosclerosis. 2. Bilateral pleural effusions with compressive atelectasis, moderate on the left and moderate to large on the right. 3. Slight dilatation of the main pulmonary artery to 3 cm compatible with chronic pulmonary hypertension. 4. Thoracic spondylosis. CT AP: 1. Marked distention of the gallbladder without pericholecystic fluid or wall thickening. Punctate dependent calculus is identified within. Findings may represent a hydropic gallbladder or a fasting state of the gallbladder counting for the distention. 2. Atrophic kidneys without obstructive uropathy. Findings would be in keeping with the patient's dialysis dependence. 3. Diffuse soft tissue anasarca. 4. Degenerative disc disease L4-5 and L5-S1.  Electronically Signed   By: Ashley Royalty M.D.   On: 04/16/2018 23:20   Dg Chest Port 1 View  Result Date: 04/11/2018 CLINICAL DATA:  Intubation. EXAM: PORTABLE CHEST 1 VIEW COMPARISON:  04/10/2018. FINDINGS: Endotracheal tube, NG tube, right subclavian line, right chest tube in stable position. Stable cardiomegaly. Persistent bibasilar atelectasis/infiltrates and bilateral pleural effusions. Persistent right-sided pneumothorax with slight interim improvement. IMPRESSION: 1. Lines and tubes including right chest tube in stable position. Slight improvement of right-sided pneumothorax. 2. Bibasilar atelectasis and bilateral pleural effusions, left side greater than right again noted. No significant change from prior exam. Electronically Signed   By: Marcello Moores  Register   On: 04/11/2018 07:37   Dg Chest Port 1 View  Result Date: 04/10/2018 CLINICAL DATA:  Post intubation and right-sided chest tube placement EXAM: PORTABLE CHEST 1 VIEW COMPARISON:  Earlier same day (multiple examinations) 04/16/2018; chest CT-04/09/2028 FINDINGS: Grossly unchanged cardiac silhouette and mediastinal contours with partial obscuration of the left heart border secondary to unchanged small left-sided effusion associated left basilar heterogeneous/consolidative opacities. No new focal airspace opacities. Endotracheal tube overlies the tracheal air column with tip superior to the carina. Enteric tube tip and side port project below the left hemidiaphragm. Interval placement of right subclavian vein approach central venous catheter with tip projected over the superior cavoatrial junction. Interval placement of a right lateral chest tube with reduction/resolution of right-sided effusion and unchanged small right basilar hydropneumothorax. No mediastinal shift. No acute osseus abnormalities. Degenerative change of the right glenohumeral joint is suspected though incompletely evaluated. IMPRESSION: 1. Interval placement of right-sided chest  tube with reduction/resolution of right-sided effusion post thoracentesis in grossly unchanged small right basilar hydropneumothorax. 2. Right subclavian vein approach central venous catheter tip projects over the superior cavoatrial junction. 3. Stable position of remaining support apparatus. 4. Unchanged small left-sided effusion associated left basilar opacities, atelectasis versus infiltrate. No new focal airspace opacities. Electronically Signed   By: Sandi Mariscal M.D.   On: 04/10/2018 14:47   Portable Chest X-ray  Result Date: 04/10/2018 CLINICAL DATA:  77 year old female with a history of intubation. EXAM: PORTABLE CHEST 1 VIEW COMPARISON:  04/10/2018, 02/12/2018, chest CT 04/24/2018 FINDINGS: Cardiomediastinal silhouette unchanged in size and contour, with the heart borders partially obscured by overlying lung/pleural disease. Dense opacities at the bilateral lung bases, worst on the left. Small right-sided pneumothorax persists. Interval placement of endotracheal tube which terminates approximately 3.2 cm above the carina. Interval placement of gastric tube  terminating in the left upper abdomen out of the field of view. IMPRESSION: Interval placement of endotracheal tube, terminating above the carina approximately 3.2 cm. Interval placement of gastric tube. Similar appearance of right hydropneumothorax, unchanged from the prior. Dense opacity at the left base, likely a combination of atelectasis/consolidation, pleural fluid, and/or edema. Electronically Signed   By: Corrie Mckusick D.O.   On: 04/10/2018 13:23   Dg Chest Port 1 View  Result Date: 04/10/2018 CLINICAL DATA:  Post recent right-sided thoracentesis with persistent respiratory distress. EXAM: PORTABLE CHEST 1 VIEW COMPARISON:  Earlier same day; 04/13/2018; chest CT-04/02/2018 FINDINGS: Grossly unchanged enlarged cardiac silhouette and mediastinal contours. Grossly unchanged small loculated right basilar hydropneumothorax post recent  thoracentesis. No midline shift. Unchanged small left-sided effusion with associated left basilar opacities. No new focal airspace opacities. Mild pulmonary venous congestion without frank evidence of edema. No acute osseus abnormalities. No acute osseus abnormalities. IMPRESSION: 1. Grossly unchanged small loculated right basilar hydropneumothorax post recent thoracentesis. 2. Unchanged small left-sided effusion associated left basilar opacities, atelectasis versus infiltrate. 3. Pulmonary venous congestion without frank evidence of edema. Electronically Signed   By: Sandi Mariscal M.D.   On: 04/10/2018 12:25   Dg Chest Portable 1 View  Result Date: 04/08/2018 CLINICAL DATA:  Shortness of breath. Refused dialysis today. History of CHF. EXAM: PORTABLE CHEST 1 VIEW COMPARISON:  Chest radiograph Feb 12, 2018 FINDINGS: Stable cardiomegaly. Pulmonary vascular congestion. Similar moderate to large bilateral pleural effusions. Biapical pleural capping. Prominent pleural reflection mid lung zone. No mediastinal shift. Soft tissue planes and included osseous structures are nonacute. High riding bilateral humeral heads seen with old rotator cuff injuries. IMPRESSION: 1. Moderate to large pleural effusions, possible RIGHT hydropneumothorax without mediastinal shift. Similar underlying consolidation. 2. Stable cardiomegaly and pulmonary vascular congestion. 3. Acute findings discussed with and reconfirmed by Dr.GRAYDON GOODMAN on 04/06/2018 at 9:49 pm. Electronically Signed   By: Elon Alas M.D.   On: 04/08/2018 21:49   US Abdomen Limited Ruq  Result Date: 04/11/2018 CLINICAL DATA:  Question of gallstones on recent CT EXAM: ULTRASOUND ABDOMEN LIMITED RIGHT UPPER QUADRANT COMPARISON:  CT abdomen and pelvis April 09, 2018 FINDINGS: Gallbladder: Gallbladder appears somewhat distended with irregularities along the gallbladder wall. Sludge is seen in the gallbladder. There are no convincing echogenic foci which move and  shadow as is expected with gallstones. There is no pericholecystic fluid. No sonographic Murphy sign noted by sonographer. Common bile duct: Diameter: 4 mm. No intrahepatic or extrahepatic biliary duct dilatation. Liver: No focal lesion identified. Within normal limits in parenchymal echogenicity. Portal vein is patent on color Doppler imaging with normal direction of blood flow towards the liver. Trace ascites. IMPRESSION: 1. Sludge in gallbladder. No convincing gallstones. Tiny gallstones could be obscured by sludge. The gallbladder is somewhat distended with mild irregularity along the gallbladder wall. Gallbladder wall is not frankly thickened, however. Question underlying inflammation within the gallbladder wall. This combination of findings may warrant correlation with nuclear medicine hepatobiliary imaging study to assess for cystic duct patency. 2.  Slight ascites. 3.  Study otherwise unremarkable. Electronically Signed   By: Lowella Grip III M.D.   On: 04/11/2018 08:59   US Thoracentesis Asp Pleural Space W/img Guide  Result Date: 04/10/2018 INDICATION: Symptomatic right sided pleural effusion. Note, patient has known right basilar hydropneumothorax demonstrated on preceding chest radiograph and chest CT. EXAM: US THORACENTESIS ASP PLEURAL SPACE W/IMG GUIDE COMPARISON:  Chest radiograph-04/22/2018; chest CT-03/28/2018; MEDICATIONS: None. COMPLICATIONS: SIR LEVEL B - Normal  therapy, includes overnight admission for observation. Patient experienced a sudden transient decreased oxygen saturation to the level of 40s necessitating calling of a rapid response, however her oxygen saturation levels normalized to the mid 90s with the administration of supplement total oxygen and postprocedural chest radiograph demonstrated resolution of the right-sided effusion with no change in the size of the right basilar pneumothorax and no additional intervention was required. TECHNIQUE: Informed written consent was  obtained from the patient after a discussion of the risks, benefits and alternatives to treatment. A timeout was performed prior to the initiation of the procedure. With the patient positioned left lateral decubitus, initial ultrasound scanning demonstrates a moderate right-sided anechoic pleural effusion. The lower chest was prepped and draped in the usual sterile fashion. 1% lidocaine was used for local anesthesia. An ultrasound image was saved for documentation purposes. An 8 Fr Safe-T-Centesis catheter was introduced. The thoracentesis was performed. The catheter was removed and a dressing was applied. Patient experienced a sudden transient decreased oxygen saturation to the level of 40s necessitating calling of a rapid response, however her oxygen saturation levels normalized to the mid 90s with the administration of supplement total oxygen. The patient had a portable upright chest radiograph which demonstrated resolution of the right-sided effusion with no change in the size of the right basilar pneumothorax and no additional intervention was required. FINDINGS: A total of approximately 400 cc of blood-tinged fluid was removed. IMPRESSION: Successful ultrasound-guided right sided thoracentesis yielding 400 cc of blood tinged pleural fluid. Patient experienced a sudden transient decreased oxygen saturation to the level of 40s necessitating calling of a rapid response, however her oxygen saturation levels normalized to the mid 90s with the administration of supplement total oxygen and postprocedural chest radiograph demonstrated resolution of the right-sided effusion with no change in the size of the right basilar pneumothorax and no additional intervention was required. Electronically Signed   By: Sandi Mariscal M.D.   On: 04/10/2018 11:20     CBC Recent Labs  Lab 03/28/2018 2125 04/10/18 0417 04/10/18 1229 04/11/18 0445 04/12/18 0405  WBC 7.1 6.2 11.6* 10.0 12.0*  HGB 10.1* 11.0* 10.2* 9.6* 8.9*  HCT  31.2* 33.6* 32.3* 29.4* 27.6*  PLT 389 412 413 355 261  MCV 86.5 87.2 88.8 87.3 87.8  MCH 28.0 28.4 28.1 28.6 28.2  MCHC 32.4 32.6 31.6* 32.8 32.1  RDW 17.6* 18.0* 17.4* 17.1* 17.7*  LYMPHSABS 1.3  --   --  0.3* 0.7*  MONOABS 0.8  --   --  0.7 0.9  EOSABS 0.1  --   --  0.0 0.0  BASOSABS 0.1  --   --  0.1 0.0    Chemistries  Recent Labs  Lab 04/10/18 0001 04/10/18 0417 04/10/18 1229 04/11/18 0445 04/12/18 0405  NA 141 139 138 142 142  K 4.8 4.7 4.7 4.9 4.5  CL 102 101 102 105 104  CO2 29 27 28 27 29   GLUCOSE 113* 97 225* 143* 108*  BUN 71* 73* 74* 77* 53*  CREATININE 4.09* 4.18* 4.12* 4.21* 3.24*  CALCIUM 8.6* 8.4* 8.2* 8.5* 8.2*  MG  --   --  2.3 2.3 1.9  AST 30  --  33 31 32  ALT 21  --  22 19 20   ALKPHOS 118  --  120 105 102  BILITOT 0.7  --  0.7 0.7 0.7   ------------------------------------------------------------------------------------------------------------------ estimated creatinine clearance is 11.2 mL/min (A) (by C-G formula based on SCr of 3.24 mg/dL (H)). ------------------------------------------------------------------------------------------------------------------ No  results for input(s): HGBA1C in the last 72 hours. ------------------------------------------------------------------------------------------------------------------ No results for input(s): CHOL, HDL, LDLCALC, TRIG, CHOLHDL, LDLDIRECT in the last 72 hours. ------------------------------------------------------------------------------------------------------------------ Recent Labs    04/10/18 1229  TSH 7.642*   ------------------------------------------------------------------------------------------------------------------ No results for input(s): VITAMINB12, FOLATE, FERRITIN, TIBC, IRON, RETICCTPCT in the last 72 hours.  Coagulation profile Recent Labs  Lab 04/11/18 0445 04/12/18 0405  INR 1.13 1.12    No results for input(s): DDIMER in the last 72 hours.  Cardiac  Enzymes Recent Labs  Lab 04/10/18 1229 04/10/18 1827 04/11/18 0046  TROPONINI <0.03 0.07* 0.07*   ------------------------------------------------------------------------------------------------------------------ Invalid input(s): POCBNP    Assessment & Plan  Patient is a 77 year old white female end-stage renal disease  1.  Acute respiratory failure status post thoracentesis Patient had pneumothorax now with chest tube in Continue ventilator  2.  Bilateral pleural effusion  Status post drainage  3.  End-stage renal disease hemodialysis per nephrology not be able to do it today  4.  Hypothyroidism continue Synthroid  5.  History of CHF with dialysis per nephrology  6.  CODE STATUS full code         Code Status Orders  (From admission, onward)        Start     Ordered   04/10/18 0253  Full code  Continuous     04/10/18 0252    Code Status History    Date Active Date Inactive Code Status Order ID Comments User Context   02/12/2018 1528 02/13/2018 1931 Full Code [id number]  Dustin Flock, MD ED   01/10/2018 1536 01/13/2018 0045 Full Code [id number]  Loletha Grayer, MD ED   10/19/2017 1310 10/20/2017 2106 Full Code [id number]  Bettey Costa, MD Inpatient   08/17/2017 2122 08/22/2017 2014 DNR [id number]  Loletha Grayer, MD ED   08/14/2017 0459 08/15/2017 1941 Full Code [id number]  Harrie Foreman, MD Inpatient           Consults intensivist, nephrology  DVT Prophylaxis heparin  Lab Results  Component Value Date   PLT 261 04/12/2018     Time Spent in minutes   32 minutes critical care time spent  Greater than 50% of time spent in care coordination and counseling patient regarding the condition and plan of care.   Dustin Flock M.D on 04/12/2018 at 3:16 PM  Between 7am to 6pm - Pager - [phone]  After 6pm go to [email or web address] - Proofreader  [employer]   Office  [phone]

## 2018-04-12 NOTE — Progress Notes (Signed)
   04/12/18 0733  Vital Signs  Temp 98.4 F (36.9 C)  Pulse Rate (!) 129  Resp (!) 26  Oxygen Therapy  SpO2 100 %  End Tidal CO2 (EtCO2) 48  During Hemodialysis Assessment  Blood Flow Rate (mL/min) 300 mL/min  Arterial Pressure (mmHg) -190 mmHg  Venous Pressure (mmHg) 150 mmHg  Transmembrane Pressure (mmHg) 60 mmHg  Ultrafiltration Rate (mL/min) 330 mL/min  Dialysate Flow Rate (mL/min) 600 ml/min  Conductivity: Machine  13.8  HD Safety Checks Performed Yes  Dialysis Fluid Bolus Normal Saline  Bolus Amount (mL) 250 mL  Intra-Hemodialysis Comments Tx initiated  Fistula / Graft Right Forearm Arteriovenous fistula  Placement Date/Time: 10/19/17 0850   Placed prior to admission: No  Orientation: Right  Access Location: Forearm  Access Type: Arteriovenous fistula  Site Condition No complications  Status Accessed  Needle Size 17 g

## 2018-04-12 NOTE — Progress Notes (Signed)
HD Tx started w/o complication  

## 2018-04-12 NOTE — Progress Notes (Signed)
Post HD Assessment, pt tolerated tx well,seems more alert today, no change from baseline, uf goal met.    04/12/18 1040  Neurological  Level of Consciousness Responds to Voice  Orientation Level Intubated/Tracheostomy - Unable to assess  Respiratory  Respiratory Pattern Tachypnea (weening off vent )  Chest Assessment Chest expansion symmetrical  Bilateral Breath Sounds Diminished  R Upper  Breath Sounds Clear  Cough None  Cardiac  Pulse Irregular  Heart Sounds S1, S2  Jugular Venous Distention (JVD) Yes  ECG Monitor Yes  Vascular  R Radial Pulse +2  L Radial Pulse +2  Edema Generalized  Generalized Edema None  Psychosocial  Psychosocial (WDL) WDL

## 2018-04-12 NOTE — Progress Notes (Signed)
[facility]  ROUNDING NOTE   Subjective:   Seen and examined on hemodialysis. Tolerating treatment well. Uf goal of 1 liter.   D5 LR at 9m/hr overnight  Off norepinephrine  Objective:  Vital signs in last 24 hours:  Temp:  [97.9 F (36.6 C)-98.8 F (37.1 C)] 98.4 F (36.9 C) (07/19 0830) Pulse Rate:  [96-136] 132 (07/19 0830) Resp:  [15-31] 25 (07/19 0830) BP: (84-166)/(54-84) 136/76 (07/19 0830) SpO2:  [96 %-100 %] 100 % (07/19 0830) FiO2 (%):  [30 %] 30 % (07/19 0726) Weight:  [49 kg (108 lb 0.4 oz)-49.1 kg (108 lb 3.9 oz)] 49.1 kg (108 lb 3.9 oz) (07/19 0730)  Weight change: -1.3 kg (-2 lb 13.9 oz) Filed Weights   04/11/18 0454 04/12/18 0500 04/12/18 0730  Weight: 50.2 kg (110 lb 10.7 oz) 49 kg (108 lb 0.4 oz) 49.1 kg (108 lb 3.9 oz)    Intake/Output: I/O last 3 completed shifts: In: 2521.5 [I.V.:2094; NG/GT:427.5] Out: -226 [Urine:50; Chest Tube:235]   Intake/Output this shift:  No intake/output data recorded.  Physical Exam: General: Critically Ill  Head: ETT, OGT  Eyes: Eyes open, PERRL  Neck: supple  Lungs:  Clear, Pressure support FiO2 30%, left chest tube  Heart: tachycardia  Abdomen:  Soft, nontender  Extremities:  no peripheral edema.  Neurologic: Intubated, sedated  Skin: No lesions  Access: Right AVF    Basic Metabolic Panel: Recent Labs  Lab 04/10/18 0001 04/10/18 0417 04/10/18 1229 04/11/18 0445 04/12/18 0405  NA 141 139 138 142 142  K 4.8 4.7 4.7 4.9 4.5  CL 102 101 102 105 104  CO2 _0 GLUCOSE 113* 97 225* 143* 108*  BUN 71* 73* 74* 77* 53*  CREATININE 4.09* 4.18* 4.12* 4.21* 3.24*  CALCIUM 8.6* 8.4* 8.2* 8.5* 8.2*  MG  --   --  2.3 2.3 1.9  PHOS  --   --   --  5.4* 4.1    Liver Function Tests: Recent Labs  Lab 04/10/18 0001 04/10/18 1229 04/11/18 0445 04/12/18 0405  AST 30 33 31 32  ALT _1 ALKPHOS 118 120 105 102  BILITOT 0.7 0.7 0.7 0.7  PROT 6.6 6.8 6.5 6.4*  ALBUMIN 2.9* 2.9*  2.8* 2.7*   Recent Labs  Lab 04/11/18 0445  LIPASE 29   Recent Labs  Lab 04/11/18 0643  AMMONIA 15    CBC: Recent Labs  Lab 04/08/2018 2125 04/10/18 0417 04/10/18 1229 04/11/18 0445 04/12/18 0405  WBC 7.1 6.2 11.6* 10.0 12.0*  NEUTROABS 4.8  --   --  8.9* 10.4*  HGB 10.1* 11.0* 10.2* 9.6* 8.9*  HCT 31.2* 33.6* 32.3* 29.4* 27.6*  MCV 86.5 87.2 88.8 87.3 87.8  PLT 389 412 413 355 261    Cardiac Enzymes: Recent Labs  Lab 04/10/18 0001 04/10/18 1229 04/10/18 1827 04/11/18 0046  TROPONINI <0.03 <0.03 0.07* 0.07*    BNP: Invalid input(s): POCBNP  CBG: Recent Labs  Lab 04/11/18 1540 04/11/18 1925 04/12/18 0015 04/12/18 0359 04/12/18 0715  GLUCAP 133* [id number]* 105* 108*    Microbiology: Results for orders placed or performed during the hospital encounter of 04/24/2018  MRSA PCR Screening     Status: None   Collection Time: 04/10/18  3:02 AM  Result Value Ref Range Status   MRSA by PCR NEGATIVE NEGATIVE Final    Comment:        The GeneXpert MRSA Assay (FDA approved for NASAL specimens  only), is one component of a comprehensive MRSA colonization surveillance program. It is not intended to diagnose MRSA infection nor to guide or monitor treatment for MRSA infections. Performed at [facility], [address] [place], [place] [address]   CULTURE, BLOOD (ROUTINE X 2) w Reflex to ID Panel     Status: None (Preliminary result)   Collection Time: 04/10/18 12:29 PM  Result Value Ref Range Status   Specimen Description BLOOD L HAND  Final   Special Requests   Final    BOTTLES DRAWN AEROBIC AND ANAEROBIC Blood Culture adequate volume   Culture   Final    NO GROWTH 2 DAYS Performed at [facility], [address], [place], [place] [address]    Report Status PENDING  Incomplete  Body fluid culture with gram stain     Status: None (Preliminary result)   Collection Time: 04/10/18 12:40 PM  Result Value Ref Range Status   Specimen  Description   Final    PLEURAL Performed at [facility], [address] [place], [place] [address]    Special Requests   Final    PLEURAL Performed at [facility], [address] [place], [place] [address]    Gram Stain   Final    ABUNDANT WBC PRESENT, PREDOMINANTLY MONONUCLEAR NO ORGANISMS SEEN    Culture   Final    NO GROWTH 2 DAYS Performed at [facility], [place] [address] [place], [place] [address]    Report Status PENDING  Incomplete  CULTURE, BLOOD (ROUTINE X 2) w Reflex to ID Panel     Status: None (Preliminary result)   Collection Time: 04/10/18  3:09 PM  Result Value Ref Range Status   Specimen Description BLOOD LT HAND  Final   Special Requests   Final    BOTTLES DRAWN AEROBIC AND ANAEROBIC Blood Culture adequate volume   Culture   Final    NO GROWTH 2 DAYS Performed at [facility], [address], [place], [place] [address]    Report Status PENDING  Incomplete    Coagulation Studies: Recent Labs    04/11/18 0445 04/12/18 0405  LABPROT 14.4 14.3  INR 1.13 1.12    Urinalysis: No results for input(s): COLORURINE, LABSPEC, PHURINE, GLUCOSEU, HGBUR, BILIRUBINUR, KETONESUR, PROTEINUR, UROBILINOGEN, NITRITE, LEUKOCYTESUR in the last 72 hours.  Invalid input(s): APPERANCEUR    Imaging: Dg Chest 1 View  Result Date: 04/12/2018 CLINICAL DATA:  77 year old female with shortness of breath. EXAM: CHEST  1 VIEW COMPARISON:  Multiple priors, most recently chest x-ray 04/11/2018. FINDINGS: An endotracheal tube is in place with tip 2.6 cm above the carina. Nasogastric tube extends into the proximal stomach with side port just distal to the gastroesophageal junction. Right subclavian central venous catheter with tip terminating at the superior cavoatrial junction. Right-sided small bore chest tube in place with tip in the mid right hemithorax. Small residual right pneumothorax remains (predominantly in the right mid to lower  hemithorax), decreased in size compared to yesterday's examination. Lung volumes are low. Elevation of left hemidiaphragm. Small left pleural effusion. Left basilar opacity may reflect associated atelectasis and/or consolidation. No evidence of pulmonary edema. Heart size is mildly enlarged. The patient is rotated to the left on today's exam, resulting in distortion of the mediastinal contours and reduced diagnostic sensitivity and specificity for mediastinal pathology. IMPRESSION: 1. Support apparatus, as above. 2. Decreasing small residual right pneumothorax with right-sided chest tube stable in position, as above. 3.  Low lung volumes with atelectasis and/or consolidation in the left lower lobe and small left pleural effusion. Electronically Signed   By: Vinnie Langton M.D.   On: 04/12/2018 08:17   Dg Chest 1 View  Result Date: 04/10/2018 CLINICAL DATA:  Post right-sided thoracentesis. Known right-sided pneumothorax. EXAM: CHEST  1 VIEW COMPARISON:  Chest radiograph-04/01/2018; 02/12/2018; chest CT-04/24/2018 FINDINGS: Grossly unchanged enlarged cardiac silhouette and mediastinal contours. Interval reduction/resolution of right-sided pleural effusion post thoracentesis. Minimally improved aeration of the right lung base with persistent right basilar hydropneumothorax. No mediastinal shift. Minimally improved aeration the right lower lung with persistent heterogeneous/consolidative opacities. Grossly unchanged small left-sided effusion with associated left basilar heterogeneous/consolidative opacities. No new focal airspace opacities. Mild pulmonary venous congestion. No acute osseus abnormalities. IMPRESSION: 1. Interval reduction/resolution of right-sided effusion post thoracentesis with residual right basilar pneumothorax. 2. Unchanged small left-sided effusion with associated left basilar heterogeneous/consolidative opacities, atelectasis versus infiltrate. 3. Minimally improved aeration of the right lung  base. Critical Value/emergent results were called by telephone at the time of interpretation on 04/10/2018 at 11:09 am to Dr. Posey Pronto, who verbally acknowledged these results. Electronically Signed   By: Sandi Mariscal M.D.   On: 04/10/2018 11:08   Dg Abd 1 View  Result Date: 04/10/2018 CLINICAL DATA:  Orogastric tube placement EXAM: ABDOMEN - 1 VIEW COMPARISON:  None. FINDINGS: Orogastric tube tip and side port project over the stomach. Bilateral pleural effusions with associated atelectasis. Prominent small bowel in the central abdomen. IMPRESSION: Orogastric tube tip and side port projecting over the stomach. Electronically Signed   By: Ulyses Jarred M.D.   On: 04/10/2018 14:53   Dg Chest Port 1 View  Result Date: 04/11/2018 CLINICAL DATA:  Intubation. EXAM: PORTABLE CHEST 1 VIEW COMPARISON:  04/10/2018. FINDINGS: Endotracheal tube, NG tube, right subclavian line, right chest tube in stable position. Stable cardiomegaly. Persistent bibasilar atelectasis/infiltrates and bilateral pleural effusions. Persistent right-sided pneumothorax with slight interim improvement. IMPRESSION: 1. Lines and tubes including right chest tube in stable position. Slight improvement of right-sided pneumothorax. 2. Bibasilar atelectasis and bilateral pleural effusions, left side greater than right again noted. No significant change from prior exam. Electronically Signed   By: Marcello Moores  Register   On: 04/11/2018 07:37   Dg Chest Port 1 View  Result Date: 04/10/2018 CLINICAL DATA:  Post intubation and right-sided chest tube placement EXAM: PORTABLE CHEST 1 VIEW COMPARISON:  Earlier same day (multiple examinations) 03/25/2018; chest CT-04/09/2028 FINDINGS: Grossly unchanged cardiac silhouette and mediastinal contours with partial obscuration of the left heart border secondary to unchanged small left-sided effusion associated left basilar heterogeneous/consolidative opacities. No new focal airspace opacities. Endotracheal tube overlies  the tracheal air column with tip superior to the carina. Enteric tube tip and side port project below the left hemidiaphragm. Interval placement of right subclavian vein approach central venous catheter with tip projected over the superior cavoatrial junction. Interval placement of a right lateral chest tube with reduction/resolution of right-sided effusion and unchanged small right basilar hydropneumothorax. No mediastinal shift. No acute osseus abnormalities. Degenerative change of the right glenohumeral joint is suspected though incompletely evaluated. IMPRESSION: 1. Interval placement of right-sided chest tube with reduction/resolution of right-sided effusion post thoracentesis in grossly unchanged small right basilar hydropneumothorax. 2. Right subclavian vein approach central venous catheter tip projects over the superior cavoatrial junction. 3. Stable position of remaining support apparatus. 4. Unchanged small left-sided effusion associated left basilar opacities, atelectasis versus infiltrate. No new focal airspace opacities. Electronically Signed   By: Jenny Reichmann  Watts M.D.   On: 04/10/2018 14:47   Portable Chest X-ray  Result Date: 04/10/2018 CLINICAL DATA:  77 year old female with a history of intubation. EXAM: PORTABLE CHEST 1 VIEW COMPARISON:  04/10/2018, 02/12/2018, chest CT 04/01/2018 FINDINGS: Cardiomediastinal silhouette unchanged in size and contour, with the heart borders partially obscured by overlying lung/pleural disease. Dense opacities at the bilateral lung bases, worst on the left. Small right-sided pneumothorax persists. Interval placement of endotracheal tube which terminates approximately 3.2 cm above the carina. Interval placement of gastric tube terminating in the left upper abdomen out of the field of view. IMPRESSION: Interval placement of endotracheal tube, terminating above the carina approximately 3.2 cm. Interval placement of gastric tube. Similar appearance of right  hydropneumothorax, unchanged from the prior. Dense opacity at the left base, likely a combination of atelectasis/consolidation, pleural fluid, and/or edema. Electronically Signed   By: Corrie Mckusick D.O.   On: 04/10/2018 13:23   Dg Chest Port 1 View  Result Date: 04/10/2018 CLINICAL DATA:  Post recent right-sided thoracentesis with persistent respiratory distress. EXAM: PORTABLE CHEST 1 VIEW COMPARISON:  Earlier same day; 04/23/2018; chest CT-04/13/2018 FINDINGS: Grossly unchanged enlarged cardiac silhouette and mediastinal contours. Grossly unchanged small loculated right basilar hydropneumothorax post recent thoracentesis. No midline shift. Unchanged small left-sided effusion with associated left basilar opacities. No new focal airspace opacities. Mild pulmonary venous congestion without frank evidence of edema. No acute osseus abnormalities. No acute osseus abnormalities. IMPRESSION: 1. Grossly unchanged small loculated right basilar hydropneumothorax post recent thoracentesis. 2. Unchanged small left-sided effusion associated left basilar opacities, atelectasis versus infiltrate. 3. Pulmonary venous congestion without frank evidence of edema. Electronically Signed   By: Sandi Mariscal M.D.   On: 04/10/2018 12:25   US Abdomen Limited Ruq  Result Date: 04/11/2018 CLINICAL DATA:  Question of gallstones on recent CT EXAM: ULTRASOUND ABDOMEN LIMITED RIGHT UPPER QUADRANT COMPARISON:  CT abdomen and pelvis April 09, 2018 FINDINGS: Gallbladder: Gallbladder appears somewhat distended with irregularities along the gallbladder wall. Sludge is seen in the gallbladder. There are no convincing echogenic foci which move and shadow as is expected with gallstones. There is no pericholecystic fluid. No sonographic Murphy sign noted by sonographer. Common bile duct: Diameter: 4 mm. No intrahepatic or extrahepatic biliary duct dilatation. Liver: No focal lesion identified. Within normal limits in parenchymal echogenicity.  Portal vein is patent on color Doppler imaging with normal direction of blood flow towards the liver. Trace ascites. IMPRESSION: 1. Sludge in gallbladder. No convincing gallstones. Tiny gallstones could be obscured by sludge. The gallbladder is somewhat distended with mild irregularity along the gallbladder wall. Gallbladder wall is not frankly thickened, however. Question underlying inflammation within the gallbladder wall. This combination of findings may warrant correlation with nuclear medicine hepatobiliary imaging study to assess for cystic duct patency. 2.  Slight ascites. 3.  Study otherwise unremarkable. Electronically Signed   By: Lowella Grip III M.D.   On: 04/11/2018 08:59   US Thoracentesis Asp Pleural Space W/img Guide  Result Date: 04/10/2018 INDICATION: Symptomatic right sided pleural effusion. Note, patient has known right basilar hydropneumothorax demonstrated on preceding chest radiograph and chest CT. EXAM: US THORACENTESIS ASP PLEURAL SPACE W/IMG GUIDE COMPARISON:  Chest radiograph-04/03/2018; chest CT-04/04/2018; MEDICATIONS: None. COMPLICATIONS: SIR LEVEL B - Normal therapy, includes overnight admission for observation. Patient experienced a sudden transient decreased oxygen saturation to the level of 40s necessitating calling of a rapid response, however her oxygen saturation levels normalized to the mid 90s with the administration of supplement total oxygen and  postprocedural chest radiograph demonstrated resolution of the right-sided effusion with no change in the size of the right basilar pneumothorax and no additional intervention was required. TECHNIQUE: Informed written consent was obtained from the patient after a discussion of the risks, benefits and alternatives to treatment. A timeout was performed prior to the initiation of the procedure. With the patient positioned left lateral decubitus, initial ultrasound scanning demonstrates a moderate right-sided anechoic pleural  effusion. The lower chest was prepped and draped in the usual sterile fashion. 1% lidocaine was used for local anesthesia. An ultrasound image was saved for documentation purposes. An 8 Fr Safe-T-Centesis catheter was introduced. The thoracentesis was performed. The catheter was removed and a dressing was applied. Patient experienced a sudden transient decreased oxygen saturation to the level of 40s necessitating calling of a rapid response, however her oxygen saturation levels normalized to the mid 90s with the administration of supplement total oxygen. The patient had a portable upright chest radiograph which demonstrated resolution of the right-sided effusion with no change in the size of the right basilar pneumothorax and no additional intervention was required. FINDINGS: A total of approximately 400 cc of blood-tinged fluid was removed. IMPRESSION: Successful ultrasound-guided right sided thoracentesis yielding 400 cc of blood tinged pleural fluid. Patient experienced a sudden transient decreased oxygen saturation to the level of 40s necessitating calling of a rapid response, however her oxygen saturation levels normalized to the mid 90s with the administration of supplement total oxygen and postprocedural chest radiograph demonstrated resolution of the right-sided effusion with no change in the size of the right basilar pneumothorax and no additional intervention was required. Electronically Signed   By: Sandi Mariscal M.D.   On: 04/10/2018 11:20     Medications:   . dexmedetomidine (PRECEDEX) IV infusion 0.199 mcg/kg/hr (04/12/18 0600)  . dextrose 5% lactated ringers 50 mL/hr at 04/12/18 0600  . feeding supplement (VITAL AF 1.2 CAL) 45 mL/hr at 04/12/18 0600  . levofloxacin (LEVAQUIN) IV Stopped (04/11/18 1500)  . norepinephrine (LEVOPHED) Adult infusion Stopped (04/11/18 1634)  . vancomycin Stopped (04/11/18 1405)   . aspirin  81 mg Per Tube Daily  . B-complex with vitamin C  1 tablet Per Tube QHS   . chlorhexidine gluconate (MEDLINE KIT)  15 mL Mouth Rinse BID  . epoetin (EPOGEN/PROCRIT) injection  10,000 Units Intravenous Q T,Th,Sa-HD  . famotidine  20 mg Per Tube Daily  . heparin  5,000 Units Subcutaneous Q8H  . ipratropium-albuterol  3 mL Nebulization Q6H  . latanoprost  1 drop Both Eyes QHS  . levothyroxine  25 mcg Intravenous Daily  . mouth rinse  15 mL Mouth Rinse 10 times per day  . mirtazapine  7.5 mg Oral QHS  . sodium chloride flush  10-40 mL Intracatheter Q12H   acetaminophen **OR** acetaminophen, docusate, fentaNYL (SUBLIMAZE) injection, heparin, ondansetron **OR** ondansetron (ZOFRAN) IV, pentafluoroprop-tetrafluoroeth, pentafluoroprop-tetrafluoroeth, sodium chloride flush  Assessment/ Plan:  Ann Cunningham is a 78 y.o. black female with end stage renal disease on hemodialysis TTS, hypertension, tobacco abuse, glaucoma with blindness, hypothyroidism, congestive heart failure who is admitted to [facility] on 04/02/2018  Patient with large volume right sided thoracentesis on 7/17 by radiology, however patient developed a pneumothorax and required mechanical ventilation and intubation. Chest tube placed  [facility] TTS [facility] EDW 51.5kg Right AVF  1. End Stage Renal Disease: seen and examined on hemodialysis treatment. Tolerating treatment.  Extra treatment today to help with respiratory status  2. Hypotension:  - off vasopressors.  3. Anemia of chronic kidney disease: hemoglobin 8.9 - EPO with TTS treatments.   4. Secondary Hyperparathyroidism: Labs from 7/9: PTH 219, calcium 8.2 phosphorus 3.8 - at goal.    LOS: 2 Ann Cunningham 7/19/20198:36 AM

## 2018-04-12 NOTE — Progress Notes (Signed)
Pre HD Assessment    04/12/18 0738  Neurological  Level of Consciousness Responds to Voice  Orientation Level Intubated/Tracheostomy - Unable to assess  Respiratory  Respiratory Pattern Tachypnea (weening off vent )  Chest Assessment Chest expansion symmetrical  Bilateral Breath Sounds Diminished  R Upper  Breath Sounds Clear  Cough None  Cardiac  Pulse Irregular  Heart Sounds S1, S2  Jugular Venous Distention (JVD) Yes  ECG Monitor Yes  Vascular  R Radial Pulse +2  L Radial Pulse +2  Edema Generalized  Generalized Edema None  Psychosocial  Psychosocial (WDL) WDL

## 2018-04-12 NOTE — Progress Notes (Signed)
Palliative care:  Able to speak with family this AM via telephone. Bonita QuinLinda, patient's niece, tells me she will be here at 14:30 [facility] Bonita QuinLinda shares that patient does not have documented HCPOA and there is no other family. Patient is not married and does not have children. She has another niece named Lynden AngVicky who is out of town.   No charge.

## 2018-04-12 NOTE — Progress Notes (Signed)
CRITICAL CARE NOTE  CC  follow up hypercapnic respiratory failure secondary to pleural effusion and pneumothorax  SUBJECTIVE Patient remains critically ill Prognosis is guarded  77 year old female with ESRD, CHF, and bilateral pleural effusion. Patient continues to be intubated and sedated over night. Patient doing well with SBT this morning. Patient had hemodialysis yesterday and is having another treatment today. Patient responds to my voice and is following commands.     BP 139/67   Pulse (!) 130   Temp 98.2 F (36.8 C)   Resp (!) 23   Ht 5\' 2"  (1.575 m)   Wt 49.1 kg (108 lb 3.9 oz)   SpO2 100%   BMI 19.80 kg/m    REVIEW OF SYSTEMS  PATIENT IS UNABLE TO PROVIDE COMPLETE REVIEW OF SYSTEM S DUE TO SEVERE CRITICAL ILLNESS AND ENCEPHALOPATHY   PHYSICAL EXAMINATION:  GENERAL:critically ill appearing, -resp distress HEAD: Normocephalic, atraumatic.  EYES: Pupils equal, round, reactive to light.  No scleral icterus.  MOUTH: Moist mucosal membrane. NECK: Supple. No thyromegaly. No nodules. No JVD.  PULMONARY: +rhonchi, +wheezing CARDIOVASCULAR: S1 and S2. Regular rate and rhythm. No murmurs, rubs, or gallops.  GASTROINTESTINAL: Soft, nontender, -distended. No masses. Positive bowel sounds. No hepatosplenomegaly.  MUSCULOSKELETAL: No swelling, clubbing, or edema.  NEUROLOGIC: alert, following verbal commands.  SKIN: ulceration on left forearm, skin breakdown on right forearm  ASSESSMENT AND PLAN SYNOPSIS  77 year old female with hypercarbic respiratory failure secondary to bilateral pleural effusions and pneumothorax. Patient has ESRD and is on hemodialysis. Patient SBT has improved from yesterday to today, but is not ready to be extubated today. Will plan to try again after dialysis   Severe Hypoxic and Hypercapnic Respiratory Failure -full MV support -continue Bronchodilator Therapy -SBT failed 7/18 and 7/19 -ABG on SBT: pH of 7.32 with CO2 of 63 -CXR 7/19: shows  improvement in right sided pneumothorax with continued bilateral pleural effusions. Possible atelectasis vs consolidation in left lower lobe  -Chest tube on water seal   ESRD -hemodialysis yesterday and today -Anemia of chronic kidney disease: hemoglobin trending down 9.6-->8.9 -appreciate nephrology consult   NEUROLOGY - intubated: precedex, Patient doing well -acute encephalopathy likely secondary to hypercarbic respiratory failure is improving -ammonia normal  CARDIAC -ICU monitoring -Echo on 7/17: EF of 30-35% with aortic regurgitation. No major changes from 07/2017 echo. -patient off of pressors: will monitor with dialysis treatment   GI: -RUQ US 7/17: distention of gallbladder with possible underlying inflammation. Recommend HIDA scan. Will pursue further once patient is stable -lipase normal -low albumin, otherwise hepatic function panel normal  ID -abx: levaquin and vancomycin -Cultures:  -pleural fluid: no growth, elevated LDH indicating exudative  etiology  -blood cultures: no growth  Endocrine: -TSH: elevated, synthroid increased yesterday  -T4 is normal -Tube feeds started yesterday: sugar 108 this morning   DVT: heparin subQ GI:  pepcid  Palliative care meeting with family today  TRANSFUSIONS AS NEEDED MONITOR FSBS ASSESS the need for LABS as needed   Critical Care Time devoted to patient care services described in this note is 34 minutes.   Overall, patient is critically ill, prognosis is guarded.  Patient with Multiorgan failure and at high risk for cardiac arrest and death.

## 2018-04-12 NOTE — Progress Notes (Signed)
Post HD Tx    04/12/18 1035  Vital Signs  Temp 98.2 F (36.8 C)  Temp Source Core  Pulse Rate (!) 119  Pulse Rate Source Monitor  Resp (!) 23  BP 104/68  BP Location Left Arm  BP Method Automatic  Patient Position (if appropriate) Lying  Oxygen Therapy  SpO2 100 %  O2 Device Ventilator  End Tidal CO2 (EtCO2) 44  Dialysis Weight  Weight 48.7 kg (107 lb 5.8 oz)  Type of Weight Post-Dialysis  Post-Hemodialysis Assessment  Rinseback Volume (mL) 250 mL  KECN 31.4 V  Dialyzer Clearance Lightly streaked  Duration of HD Treatment -hour(s) 3 hour(s)  Hemodialysis Intake (mL) 500 mL  UF Total -Machine (mL) 1003 mL  Net UF (mL) 503 mL  Tolerated HD Treatment Yes  AVG/AVF Arterial Site Held (minutes) 10 minutes  AVG/AVF Venous Site Held (minutes) 10 minutes  Fistula / Graft Right Forearm Arteriovenous fistula  Placement Date/Time: 10/19/17 0850   Placed prior to admission: No  Orientation: Right  Access Location: Forearm  Access Type: Arteriovenous fistula  Site Condition No complications  Fistula / Graft Assessment Present;Thrill;Bruit  Status Deaccessed  Drainage Description None

## 2018-04-12 NOTE — Progress Notes (Signed)
Pharmacy Antibiotic Note  Ann Cunningham is a 77 y.o. female admitted on 09-06-2018 with sepsis. Patient being treated for possible cellulitis/abscess. Pharmacy has been consulted for vancomycin dosing. Patient receives regularly scheduled dialysis Tuesday/Thursday/Saturday. Patient receiving dialysis 7/18.   Plan: Continue vancomycin 500mg  IV to be infused during the last hour of dialysis. Will obtain trough prior to 3rd dialysis session on vancomycin.    Continue levofloxacin 500mg  IV Q48hr.   Please note patient tolerated cefazolin during previous procedure.   Height: 5\' 2"  (157.5 cm) Weight: 107 lb 5.8 oz (48.7 kg) IBW/kg (Calculated) : 50.1  Temp (24hrs), Avg:98.5 F (36.9 C), Min:98.1 F (36.7 C), Max:99.5 F (37.5 C)  Recent Labs  Lab 23-Nov-2017 2125 04/10/18 0001 04/10/18 0417 04/10/18 1229 04/11/18 0445 04/12/18 0405  WBC 7.1  --  6.2 11.6* 10.0 12.0*  CREATININE  --  4.09* 4.18* 4.12* 4.21* 3.24*    Estimated Creatinine Clearance: 11.2 mL/min (A) (by C-G formula based on SCr of 3.24 mg/dL (H)).    Allergies  Allergen Reactions  . Penicillins Other (See Comments)    Has patient had a PCN reaction causing immediate rash, facial/tongue/throat swelling, SOB or lightheadedness with hypotension: Unknown Has patient had a PCN reaction causing severe rash involving mucus membranes or skin necrosis: Unknown Has patient had a PCN reaction that required hospitalization: Unknown Has patient had a PCN reaction occurring within the last 10 years: Unknown If all of the above answers are "NO", then may proceed with Cephalosporin use.     Antimicrobials this admission: Vancomycin 7/17 >>  Levofloxacin 7/18 >>  Dose adjustments this admission: 7/18 Levofloxacin renally adjusted   Microbiology results: 7/17 BCx: no growth x 2 days  7/17 Body fluid Culture: no growth x 2 days  7/17 MRSA PCR: negative   Thank you for allowing pharmacy to be a part of this patient's  care.  Ann Cunningham 04/12/2018 5:09 PM

## 2018-04-13 ENCOUNTER — Inpatient Hospital Stay: Payer: Medicare Other

## 2018-04-13 DIAGNOSIS — J9602 Acute respiratory failure with hypercapnia: Secondary | ICD-10-CM

## 2018-04-13 DIAGNOSIS — I255 Ischemic cardiomyopathy: Secondary | ICD-10-CM

## 2018-04-13 DIAGNOSIS — N186 End stage renal disease: Secondary | ICD-10-CM

## 2018-04-13 DIAGNOSIS — J9601 Acute respiratory failure with hypoxia: Secondary | ICD-10-CM

## 2018-04-13 DIAGNOSIS — Z992 Dependence on renal dialysis: Secondary | ICD-10-CM

## 2018-04-13 LAB — COMPREHENSIVE METABOLIC PANEL
ALK PHOS: 120 U/L (ref 38–126)
ALT: 42 U/L (ref 0–44)
ANION GAP: 9 (ref 5–15)
AST: 59 U/L — ABNORMAL HIGH (ref 15–41)
Albumin: 2.4 g/dL — ABNORMAL LOW (ref 3.5–5.0)
BUN: 47 mg/dL — ABNORMAL HIGH (ref 8–23)
CALCIUM: 7.7 mg/dL — AB (ref 8.9–10.3)
CO2: 31 mmol/L (ref 22–32)
Chloride: 100 mmol/L (ref 98–111)
Creatinine, Ser: 2.59 mg/dL — ABNORMAL HIGH (ref 0.44–1.00)
GFR calc non Af Amer: 17 mL/min — ABNORMAL LOW (ref >60)
GFR, EST AFRICAN AMERICAN: 19 mL/min — AB (ref >60)
GLUCOSE: 111 mg/dL — AB (ref 70–99)
Potassium: 4.2 mmol/L (ref 3.5–5.1)
Sodium: 140 mmol/L (ref 135–145)
Total Bilirubin: 0.6 mg/dL (ref 0.3–1.2)
Total Protein: 5.9 g/dL — ABNORMAL LOW (ref 6.5–8.1)

## 2018-04-13 LAB — GLUCOSE, CAPILLARY
GLUCOSE-CAPILLARY: 109 mg/dL — AB (ref 70–99)
GLUCOSE-CAPILLARY: 101 mg/dL — AB (ref 70–99)
Glucose-Capillary: 109 mg/dL — ABNORMAL HIGH (ref 70–99)
Glucose-Capillary: 117 mg/dL — ABNORMAL HIGH (ref 70–99)
Glucose-Capillary: 113 mg/dL — ABNORMAL HIGH (ref 70–99)
Glucose-Capillary: 98 mg/dL (ref 70–99)

## 2018-04-13 LAB — CBC
HEMATOCRIT: 25.9 % — AB (ref 35.0–47.0)
Hemoglobin: 8.3 g/dL — ABNORMAL LOW (ref 12.0–16.0)
MCH: 27.8 pg (ref 26.0–34.0)
MCHC: 32 g/dL (ref 32.0–36.0)
MCV: 86.9 fL (ref 80.0–100.0)
Platelets: 236 10*3/uL (ref 150–440)
RBC: 2.99 MIL/uL — AB (ref 3.80–5.20)
RDW: 17.4 % — ABNORMAL HIGH (ref 11.5–14.5)
WBC: 12.3 10*3/uL — ABNORMAL HIGH (ref 3.6–11.0)

## 2018-04-13 LAB — BODY FLUID CULTURE: CULTURE: NO GROWTH

## 2018-04-13 LAB — PHOSPHORUS: Phosphorus: 2.6 mg/dL (ref 2.5–4.6)

## 2018-04-13 LAB — MAGNESIUM: Magnesium: 2.2 mg/dL (ref 1.7–2.4)

## 2018-04-13 MED ORDER — HEPARIN SODIUM (PORCINE) 1000 UNIT/ML DIALYSIS: 50.0000 [IU]/kg | INTRAMUSCULAR | Status: DC | PRN

## 2018-04-13 MED ORDER — DEXMEDETOMIDINE HCL IN NACL 400 MCG/100ML IV SOLN
0.0000 ug/kg/h | INTRAVENOUS | Status: DC
  Administered 2018-04-13: 0.2 ug/kg/h via INTRAVENOUS

## 2018-04-13 MED ORDER — CHLORHEXIDINE GLUCONATE CLOTH 2 % EX PADS
6.0000 | MEDICATED_PAD | Freq: Every day | CUTANEOUS | Status: DC
  Administered 2018-04-13 – 2018-04-16 (×3): 6 via TOPICAL

## 2018-04-13 NOTE — Progress Notes (Signed)
Dialysis treatment ended safely 

## 2018-04-13 NOTE — Progress Notes (Signed)
Post dialysis 

## 2018-04-13 NOTE — Progress Notes (Signed)
During WUA, precedex turned off. Pt became restless at times. Prior to turning precedex back on, pt became very tachypnic with RR in the 40s. Precedex turned back on, pt resting quietly at this time. Will continue to monitor.

## 2018-04-13 NOTE — Clinical Social Work Note (Signed)
Clinical Social Work Assessment  Patient Details  Name: Ann CoolerWilma Raether MRN: [id number] Date of Birth: [birth year]  Date of referral:  04/13/18               Reason for consult:  Facility Placement                Permission sought to share information with:  Facility Industrial/product designerContact Representative Permission granted to share information::  Yes, Verbal Permission Granted  Name::        Agency::  [facility]  Relationship::     Contact Information:     Housing/Transportation Living arrangements for the past 2 months:  Skilled Nursing Facility Source of Information:  Patient, Facility, Medical Team Patient Interpreter Needed:  None Criminal Activity/Legal Involvement Pertinent to Current Situation/Hospitalization:  No - Comment as needed Significant Relationships:  Merchandiser, retailCommunity Support, Warehouse managerChurch, Other Family Members Lives with:  Facility Resident Do you feel safe going back to the place where you live?  Yes Need for family participation in patient care:  Yes (Comment)(Patient is intubated and sedated)  Care giving concerns:  Patient admitted from a SNF   Social Worker assessment / plan:  The CSW attempted to meet with family at bedside with no success. The CSW attempted to call the patient's family with no answer. The CSW left HIPPA compliant voice mail. The CSW contacted the patient's facility, [facility], where the patient is a long term care resident. [facility] when stable.  The CSW will continue to attempt contacting family and follow for discharge facilitation.  Employment status:  Retired Health and safety inspectornsurance information:  [place], [employer] International[employer] Medicare PT Recommendations:  Not assessed at this time Information / Referral to community resources:     Patient/Family's Response to care:  The patient was sedated and no family available.  Patient/Family's Understanding of and Emotional Response to Diagnosis, Current  Treatment, and Prognosis:  The patient was sedated and no family available.  Emotional Assessment Appearance:  Appears stated age Attitude/Demeanor/Rapport:  Lethargic Affect (typically observed):  Stable Orientation:  Oriented to Self Alcohol / Substance use:  Never Used Psych involvement (Current and /or in the community):  No (Comment)  Discharge Needs  Concerns to be addressed:  Care Coordination, Discharge Planning Concerns Readmission within the last 30 days:  No Current discharge risk:  Chronically ill Barriers to Discharge:  Continued Medical Work up   UAL CorporationKaren M Caprice Mccaffrey, LCSW 04/13/2018, 4:31 PM

## 2018-04-13 NOTE — Progress Notes (Signed)
[facility]  ROUNDING NOTE   Subjective:   Hemodialysis treatment yesterday. Tolerated treatment well. UF of 578m.   Off norepinephrine.   Objective:  Vital signs in last 24 hours:  Temp:  [98.1 F (36.7 C)-99.5 F (37.5 C)] 99.1 F (37.3 C) (07/20 0700) Pulse Rate:  [89-129] 90 (07/20 0700) Resp:  [15-33] 15 (07/20 0700) BP: (80-132)/(46-68) 110/63 (07/20 0700) SpO2:  [97 %-100 %] 100 % (07/20 0832) FiO2 (%):  [30 %] 30 % (07/20 0832) Weight:  [48.7 kg (107 lb 5.8 oz)-52.1 kg (114 lb 13.8 oz)] 52.1 kg (114 lb 13.8 oz) (07/20 0500)  Weight change: 0.1 kg (3.5 oz) Filed Weights   04/12/18 0730 04/12/18 1035 04/13/18 0500  Weight: 49.1 kg (108 lb 3.9 oz) 48.7 kg (107 lb 5.8 oz) 52.1 kg (114 lb 13.8 oz)    Intake/Output: I/O last 3 completed shifts: In: 2225 [I.V.:582.5; NG/GT:1642.5] Out: 7811[Urine:150; Other:503; Chest Tube:60]   Intake/Output this shift:  No intake/output data recorded.  Physical Exam: General: Critically Ill  Head: ETT, OGT  Eyes: Eyes open, PERRL  Neck: supple  Lungs:  Clear, Pressure support FiO2 30%, left chest tube  Heart: tachycardia  Abdomen:  Soft, nontender  Extremities:  no peripheral edema.  Neurologic: Intubated, sedated  Skin: No lesions  Access: Right AVF    Basic Metabolic Panel: Recent Labs  Lab 04/10/18 0417 04/10/18 1229 04/11/18 0445 04/12/18 0405 04/13/18 0608  NA 139 138 142 142 140  K 4.7 4.7 4.9 4.5 4.2  CL 101 102 105 104 100  CO2 _0 GLUCOSE 97 225* 143* 108* 111*  BUN 73* 74* 77* 53* 47*  CREATININE 4.18* 4.12* 4.21* 3.24* 2.59*  CALCIUM 8.4* 8.2* 8.5* 8.2* 7.7*  MG  --  2.3 2.3 1.9 2.2  PHOS  --   --  5.4* 4.1 2.6    Liver Function Tests: Recent Labs  Lab 04/10/18 0001 04/10/18 1229 04/11/18 0445 04/12/18 0405 04/13/18 0608  AST 30 33 31 32 59*  ALT _1 42  ALKPHOS 118 120 105 102 120  BILITOT 0.7 0.7 0.7 0.7 0.6  PROT 6.6 6.8 6.5 6.4* 5.9*  ALBUMIN 2.9*  2.9* 2.8* 2.7* 2.4*   Recent Labs  Lab 04/11/18 0445  LIPASE 29   Recent Labs  Lab 04/11/18 0643  AMMONIA 15    CBC: Recent Labs  Lab 03/28/2018 2125 04/10/18 0417 04/10/18 1229 04/11/18 0445 04/12/18 0405 04/13/18 0608  WBC 7.1 6.2 11.6* 10.0 12.0* 12.3*  NEUTROABS 4.8  --   --  8.9* 10.4*  --   HGB 10.1* 11.0* 10.2* 9.6* 8.9* 8.3*  HCT 31.2* 33.6* 32.3* 29.4* 27.6* 25.9*  MCV 86.5 87.2 88.8 87.3 87.8 86.9  PLT 389 412 413 355 261 236    Cardiac Enzymes: Recent Labs  Lab 04/10/18 0001 04/10/18 1229 04/10/18 1827 04/11/18 0046  TROPONINI <0.03 <0.03 0.07* 0.07*    BNP: Invalid input(s): POCBNP  CBG: Recent Labs  Lab 04/12/18 0715 04/12/18 1114 04/12/18 1648 04/12/18 1920 04/12/18 2327  GLUCAP 108* 113* 104* 111* 112*    Microbiology: Results for orders placed or performed during the hospital encounter of 04/08/2018  MRSA PCR Screening     Status: None   Collection Time: 04/10/18  3:02 AM  Result Value Ref Range Status   MRSA by PCR NEGATIVE NEGATIVE Final    Comment:        The GeneXpert MRSA Assay (FDA  approved for NASAL specimens only), is one component of a comprehensive MRSA colonization surveillance program. It is not intended to diagnose MRSA infection nor to guide or monitor treatment for MRSA infections. Performed at [facility], [address] [place], [place] [address]   CULTURE, BLOOD (ROUTINE X 2) w Reflex to ID Panel     Status: None (Preliminary result)   Collection Time: 04/10/18 12:29 PM  Result Value Ref Range Status   Specimen Description BLOOD L HAND  Final   Special Requests   Final    BOTTLES DRAWN AEROBIC AND ANAEROBIC Blood Culture adequate volume   Culture   Final    NO GROWTH 3 DAYS Performed at [facility], [address], [place], [place] [address]    Report Status PENDING  Incomplete  Body fluid culture with gram stain     Status: None (Preliminary result)   Collection Time: 04/10/18  12:40 PM  Result Value Ref Range Status   Specimen Description   Final    PLEURAL Performed at [facility], [address], [place], [place] [address]    Special Requests   Final    PLEURAL Performed at [facility], [address] [place],  [address]    Gram Stain   Final    ABUNDANT WBC PRESENT, PREDOMINANTLY MONONUCLEAR NO ORGANISMS SEEN    Culture   Final    NO GROWTH 3 DAYS Performed at [facility], [place] [address], [place],  [address]    Report Status PENDING  Incomplete  CULTURE, BLOOD (ROUTINE X 2) w Reflex to ID Panel     Status: None (Preliminary result)   Collection Time: 04/10/18  3:09 PM  Result Value Ref Range Status   Specimen Description BLOOD LT HAND  Final   Special Requests   Final    BOTTLES DRAWN AEROBIC AND ANAEROBIC Blood Culture adequate volume   Culture   Final    NO GROWTH 3 DAYS Performed at [facility], [address], [place],  [address]    Report Status PENDING  Incomplete    Coagulation Studies: Recent Labs    04/11/18 0445 04/12/18 0405  LABPROT 14.4 14.3  INR 1.13 1.12    Urinalysis: No results for input(s): COLORURINE, LABSPEC, PHURINE, GLUCOSEU, HGBUR, BILIRUBINUR, KETONESUR, PROTEINUR, UROBILINOGEN, NITRITE, LEUKOCYTESUR in the last 72 hours.  Invalid input(s): APPERANCEUR    Imaging: Dg Chest 1 View  Result Date: 04/12/2018 CLINICAL DATA:  77 year old female with shortness of breath. EXAM: CHEST  1 VIEW COMPARISON:  Multiple priors, most recently chest x-ray 04/11/2018. FINDINGS: An endotracheal tube is in place with tip 2.6 cm above the carina. Nasogastric tube extends into the proximal stomach with side port just distal to the gastroesophageal junction. Right subclavian central venous catheter with tip terminating at the superior cavoatrial junction. Right-sided small bore chest tube in place with tip in the mid right hemithorax. Small residual right pneumothorax  remains (predominantly in the right mid to lower hemithorax), decreased in size compared to yesterday's examination. Lung volumes are low. Elevation of left hemidiaphragm. Small left pleural effusion. Left basilar opacity may reflect associated atelectasis and/or consolidation. No evidence of pulmonary edema. Heart size is mildly enlarged. The patient is rotated to the left on today's exam, resulting in distortion of the mediastinal contours and reduced diagnostic sensitivity and specificity for mediastinal pathology. IMPRESSION: 1. Support apparatus, as above. 2. Decreasing small residual right pneumothorax with right-sided chest tube stable in  position, as above. 3. Low lung volumes with atelectasis and/or consolidation in the left lower lobe and small left pleural effusion. Electronically Signed   By: Vinnie Langton M.D.   On: 04/12/2018 08:17   Dg Chest Port 1 View  Result Date: 04/13/2018 CLINICAL DATA:  Acute respiratory failure. EXAM: PORTABLE CHEST 1 VIEW COMPARISON:  Chest x-ray from yesterday. FINDINGS: Unchanged endotracheal tube, enteric tube, right subclavian central venous catheter, and right pigtail chest tube. Stable cardiomediastinal silhouette. Normal pulmonary vascularity. Unchanged small residual right pneumothorax in the mid to lower hemithorax. Unchanged volume loss in the right lower lobe. Unchanged small left pleural effusion with left lower lobe airspace disease. No acute osseous abnormality. IMPRESSION: 1. Unchanged small residual right pneumothorax and volume loss in the right lower lobe. 2. Unchanged small left pleural effusion with adjacent left lower lobe atelectasis/infiltrate. 3. Stable support apparatus. Electronically Signed   By: Titus Dubin M.D.   On: 04/13/2018 08:56     Medications:   . dexmedetomidine (PRECEDEX) IV infusion 0.199 mcg/kg/hr (04/13/18 0700)  . feeding supplement (VITAL AF 1.2 CAL) 45 mL/hr at 04/13/18 0700  . levofloxacin (LEVAQUIN) IV    .  norepinephrine (LEVOPHED) Adult infusion Stopped (04/11/18 1634)  . vancomycin Stopped (04/11/18 1405)   . aspirin  81 mg Per Tube Daily  . B-complex with vitamin C  1 tablet Per Tube QHS  . chlorhexidine gluconate (MEDLINE KIT)  15 mL Mouth Rinse BID  . Chlorhexidine Gluconate Cloth  6 each Topical Q0600  . epoetin (EPOGEN/PROCRIT) injection  10,000 Units Intravenous Q T,Th,Sa-HD  . famotidine  20 mg Per Tube Daily  . ipratropium-albuterol  3 mL Nebulization Q6H  . latanoprost  1 drop Both Eyes QHS  . levothyroxine  25 mcg Intravenous Daily  . mouth rinse  15 mL Mouth Rinse 10 times per day  . mirtazapine  7.5 mg Oral QHS  . sodium chloride flush  10-40 mL Intracatheter Q12H   acetaminophen **OR** acetaminophen, docusate, fentaNYL (SUBLIMAZE) injection, ondansetron **OR** ondansetron (ZOFRAN) IV, pentafluoroprop-tetrafluoroeth, pentafluoroprop-tetrafluoroeth, sodium chloride flush  Assessment/ Plan:  [name] is a 77 y.o. black female with end stage renal disease on hemodialysis TTS, hypertension, tobacco abuse, glaucoma with blindness, hypothyroidism, congestive heart failure who is admitted to [facility] on 03/27/2018  Patient with large volume right sided thoracentesis on 7/17 by radiology, however patient developed a pneumothorax and required mechanical ventilation and intubation. Chest tube placed  [facility] TTS [facility] EDW 51.5kg Right AVF  1. End Stage Renal Disease: extra hemodialysis treatment yesterday.  Plan for regularly scheduled hemodialysis treatment for today. Orders prepared.  - UF goal of 1-2 liters to assists with vent weaning  2. Hypotension: chronic. On midodrine as outpatient.  - off vasopressors.   3. Anemia of chronic kidney disease: hemoglobin 8.3 - EPO with TTS treatments.   4. Secondary Hyperparathyroidism: Labs from 7/9: PTH 219, calcium 8.2 phosphorus 3.8 - at goal.    LOS: 3 Aidah Forquer 7/20/20199:03 AM

## 2018-04-13 NOTE — Progress Notes (Signed)
Pharmacy Antibiotic Note  Ann Cunningham is a 77 y.o. female admitted on 03/26/2018 with sepsis. Patient being treated for possible cellulitis/abscess. Pharmacy has been consulted for vancomycin dosing. Patient receives regularly scheduled dialysis Tuesday/Thursday/Saturday. Patient receiving dialysis 7/18.   Plan: Continue vancomycin 500mg  IV to be infused during the last hour of dialysis. Will obtain trough prior to 3rd dialysis session on vancomycin on 7/23.  Continue levofloxacin 500mg  IV Q48hr.   Please note patient tolerated cefazolin during previous procedure.   Height: 5\' 2"  (157.5 cm) Weight: 114 lb 13.8 oz (52.1 kg) IBW/kg (Calculated) : 50.1  Temp (24hrs), Avg:98.7 F (37.1 C), Min:98.2 F (36.8 C), Max:99.5 F (37.5 C)  Recent Labs  Lab 04/10/18 0417 04/10/18 1229 04/11/18 0445 04/12/18 0405 04/13/18 0608  WBC 6.2 11.6* 10.0 12.0* 12.3*  CREATININE 4.18* 4.12* 4.21* 3.24* 2.59*    Estimated Creatinine Clearance: 14.4 mL/min (A) (by C-G formula based on SCr of 2.59 mg/dL (H)).    Allergies  Allergen Reactions  . Penicillins Other (See Comments)    Has patient had a PCN reaction causing immediate rash, facial/tongue/throat swelling, SOB or lightheadedness with hypotension: Unknown Has patient had a PCN reaction causing severe rash involving mucus membranes or skin necrosis: Unknown Has patient had a PCN reaction that required hospitalization: Unknown Has patient had a PCN reaction occurring within the last 10 years: Unknown If all of the above answers are "NO", then may proceed with Cephalosporin use.     Antimicrobials this admission: Vancomycin 7/17 >>  Levofloxacin 7/18 >>  Dose adjustments this admission: 7/18 Levofloxacin renally adjusted   Microbiology results: 7/17 BCx: no growth x 2 days  7/17 Body fluid Culture: no growth x 2 days  7/17 MRSA PCR: negative   Thank you for allowing pharmacy to be a part of this patient's care.  Stormy CardKatsoudas,Coral Timme  K, [facility]CRPH 04/13/2018 2:24 PM

## 2018-04-13 NOTE — Progress Notes (Signed)
Pre-Dialysis Assessment. 

## 2018-04-13 NOTE — Progress Notes (Signed)
Dialysis treatment started safely 

## 2018-04-13 NOTE — NC FL2 (Signed)
[place] LEVEL OF CARE SCREENING TOOL     IDENTIFICATION  Patient Name: Ann Cunningham Birthdate: 04-14-1941 Sex: female Admission Date (Current Location): 04/10/2018  [place] and [place] Number:  Ann Cunningham [id number] [place] and Address:  [facility], [address], [place], [place] [address]      Provider Number: [id number]  Attending Physician Name and Address:  Dustin Flock, MD  Relative Name and Phone Number:  Ann Cunningham (Niece) [phone] or [phone]    Current Level of Care: Hospital Recommended Level of Care: [place] Prior Approval Number:    Date Approved/Denied:   PASRR Number: [id number]  Discharge Plan: SNF    Current Diagnoses: Patient Active Problem List   Diagnosis Date Noted  . Palliative care by specialist   . Goals of care, counseling/discussion   . Bilateral pleural effusion 04/10/2018  . Hypothyroidism 04/10/2018  . Acute on chronic combined systolic and diastolic CHF (congestive heart failure) ([place]) 04/10/2018  . Acute respiratory failure ([place]) 04/10/2018  . Hyperkalemia 02/12/2018  . Hypotension 01/10/2018  . Protein-calorie malnutrition, severe 10/20/2017  . Encephalopathy 10/19/2017  . ESRD on dialysis ([place]) 09/12/2017  . Essential hypertension 09/12/2017  . Failure to thrive in adult 08/17/2017  . Dyspnea 08/14/2017    Orientation RESPIRATION BLADDER Height & Weight     Self, Time, Situation, Place  Vent Incontinent Weight: 113 lb 15.7 oz (51.7 kg) Height:  5' 2"  (157.5 cm)  BEHAVIORAL SYMPTOMS/MOOD NEUROLOGICAL BOWEL NUTRITION STATUS      Incontinent Diet(NPO to be advanced)  AMBULATORY STATUS COMMUNICATION OF NEEDS Skin   Extensive Assist Verbally Normal                       Personal Care Assistance Level of Assistance  Bathing, Feeding, Dressing Bathing Assistance: Maximum assistance Feeding assistance: Maximum assistance Dressing Assistance: Maximum  assistance     Functional Limitations Info  Sight, Hearing, Speech Sight Info: Impaired Hearing Info: Impaired Speech Info: Impaired    SPECIAL CARE FACTORS FREQUENCY                       Contractures Contractures Info: Not present    Additional Factors Info  Code Status, Allergies Code Status Info: DNR Allergies Info: Penicillins           Current Medications (04/13/2018):  This is the current hospital active medication list Current Facility-Administered Medications  Medication Dose Route Frequency Provider Last Rate Last Dose  . acetaminophen (TYLENOL) tablet 650 mg  650 mg Per Tube Q6H PRN Dustin Flock, MD       Or  . acetaminophen (TYLENOL) suppository 650 mg  650 mg Rectal Q6H PRN Dustin Flock, MD      . aspirin chewable tablet 81 mg  81 mg Per Tube Daily Dustin Flock, MD   81 mg at 04/13/18 0928  . B-complex with vitamin C tablet 1 tablet  1 tablet Per Tube QHS Lahoma Rocker, MD   1 tablet at 04/12/18 2207  . chlorhexidine gluconate (MEDLINE KIT) (PERIDEX) 0.12 % solution 15 mL  15 mL Mouth Rinse BID Lahoma Rocker, MD   15 mL at 04/13/18 0747  . Chlorhexidine Gluconate Cloth 2 % PADS 6 each  6 each Topical Q0600 Lavonia Dana, MD   6 each at 04/13/18 0951  . dexmedetomidine (PRECEDEX) 400 MCG/100ML (4 mcg/mL) infusion  0-1.2 mcg/kg/hr Intravenous Continuous Lafayette Dragon, MD 2.5 mL/hr at 04/13/18 1504 0.2 mcg/kg/hr  at 04/13/18 1504  . docusate (COLACE) 50 MG/5ML liquid 100 mg  100 mg Per Tube BID PRN Lahoma Rocker, MD      . epoetin alfa (EPOGEN,PROCRIT) injection 10,000 Units  10,000 Units Intravenous Q T,Th,Sa-HD Lavonia Dana, MD   10,000 Units at 04/13/18 1242  . famotidine (PEPCID) tablet 20 mg  20 mg Per Tube Daily Dustin Flock, MD   20 mg at 04/13/18 0928  . feeding supplement (VITAL AF 1.2 CAL) liquid 1,000 mL  1,000 mL Per Tube Continuous Lahoma Rocker, MD 45 mL/hr at 04/13/18 1500    . fentaNYL (SUBLIMAZE) injection 100 mcg  100 mcg  Intravenous Q2H PRN Lahoma Rocker, MD   100 mcg at 04/11/18 1104  . heparin injection 2,600 Units  50 Units/kg Dialysis PRN Kolluru, Sarath, MD      . ipratropium-albuterol (DUONEB) 0.5-2.5 (3) MG/3ML nebulizer solution 3 mL  3 mL Nebulization Q6H Manuella Ghazi, Rutul, MD   3 mL at 04/13/18 1314  . latanoprost (XALATAN) 0.005 % ophthalmic solution 1 drop  1 drop Both Eyes QHS Lance Coon, MD   1 drop at 04/12/18 2205  . levofloxacin (LEVAQUIN) IVPB 500 mg  500 mg Intravenous Q48H Dustin Flock, MD      . levothyroxine (SYNTHROID, LEVOTHROID) injection 25 mcg  25 mcg Intravenous Daily Lahoma Rocker, MD   25 mcg at 04/13/18 1959  . MEDLINE mouth rinse  15 mL Mouth Rinse 10 times per day Lahoma Rocker, MD   15 mL at 04/13/18 1549  . mirtazapine (REMERON) tablet 7.5 mg  7.5 mg Oral Corwin Levins, MD   7.5 mg at 04/12/18 2206  . norepinephrine (LEVOPHED) 55m in D5W 2548mpremix infusion  0-40 mcg/min Intravenous Titrated ShLahoma RockerMD   Stopped at 04/11/18 1634  . ondansetron (ZOFRAN) tablet 4 mg  4 mg Per Tube Q6H PRN PaDustin FlockMD       Or  . ondansetron (ZOFRAN) injection 4 mg  4 mg Intravenous Q6H PRN PaDustin FlockMD      . pentafluoroprop-tetrafluoroeth (GLandry Dykeaerosol   Topical PRN KoLavonia DanaMD      . pentafluoroprop-tetrafluoroeth (GLandry Dykeaerosol   Topical PRN KoLavonia DanaMD   30 application at [id number]  . sodium chloride flush (NS) 0.9 % injection 10-40 mL  10-40 mL Intracatheter Q12H ShLahoma RockerMD   40 mL at 04/13/18 0950  . sodium chloride flush (NS) 0.9 % injection 10-40 mL  10-40 mL Intracatheter PRN ShLahoma RockerMD      . vancomycin (VANCOCIN) 500 mg in sodium chloride 0.9 % 100 mL IVPB  500 mg Intravenous Q T,Th,Sa-HD PaDustin FlockMD   Stopped at 04/13/18 1505     Discharge Medications: Please see discharge summary for a list of discharge medications.  Relevant Imaging Results:  Relevant Lab Results:   Additional Information SS# [id number]/Dialysis TTS [place]

## 2018-04-13 NOTE — Progress Notes (Signed)
UF goal decreased due to low BP.  Goal was 1L now it's reduced to 

## 2018-04-13 NOTE — Progress Notes (Signed)
Pre-Dialysis assessment. 

## 2018-04-13 NOTE — Progress Notes (Signed)
Post Dialysis Assessment.   

## 2018-04-13 NOTE — Progress Notes (Signed)
CRITICAL CARE NOTE  CC  follow up hypercapnic respiratory failure secondary to pleural effusion and pneumothorax  SUBJECTIVE Patient remains critically ill Prognosis is guarded  77 year old female with ESRD, CHF, and bilateral pleural effusion. Patient continues to be intubated and sedated over night. Patient doing well with SBT this morning. Patient had hemodialysis yesterday and is having another treatment today. Patient responds to my voice and is following commands.     BP 110/63   Pulse 90   Temp 99.1 F (37.3 C)   Resp 15   Ht 5\' 2"  (1.575 m)   Wt 114 lb 13.8 oz (52.1 kg)   SpO2 100%   BMI 21.01 kg/m    REVIEW OF SYSTEMS  Unavailable as patient is intubated  PHYSICAL EXAMINATION:  GENERAL:critically ill appearing, on mechanical ventilator HEAD: Normocephalic, atraumatic.  EYES: Pupils equal, round, reactive to light.  No scleral icterus.  MOUTH: Moist mucosal membrane. NECK: Supple. No thyromegaly. No nodules. No JVD.  PULMONARY: +rhonchi, +wheezing, chest tube in place CARDIOVASCULAR: S1 and S2. Regular rate and rhythm. No murmurs, rubs, or gallops.  GASTROINTESTINAL: Soft, nontender, -distended. No masses. Positive bowel sounds. No hepatosplenomegaly.  MUSCULOSKELETAL: No swelling, clubbing, or edema.  NEUROLOGIC: alert, following verbal commands.  SKIN: ulceration on left forearm, skin breakdown on right forearm  ASSESSMENT AND PLAN SYNOPSIS  77 year old female with hypercarbic respiratory failure secondary to bilateral pleural effusions and pneumothorax. Patient has ESRD and is on hemodialysis. Patient SBT has improved from yesterday to today, but is not ready to be extubated today. Will plan to try again after dialysis   Severe Hypoxic and Hypercapnic Respiratory Failure -full MV support -continue Bronchodilator Therapy -SBT after HD today  Right Pneumothorax -chest tube was on water seal, will place back on suction as pneumothorax has not  resolved  Left pleural effusion/ atelectasis/infiltrates - continue ABX   ESRD -hemodialysis planned for today -Anemia of chronic kidney disease: hemoglobin trending down 9.6-->8.9 -appreciate nephrology consult   NEUROLOGY - intubated: precedex,  -acute encephalopathy likely secondary to hypercarbic respiratory failure is improving   CARDIAC Cardiomyopathy -ICU monitoring -Echo on 7/17: EF of 30-35% with aortic regurgitation. No major changes from 07/2017 echo. -patient off of pressors: will monitor with dialysis treatment   GI: -RUQ US 7/17: distention of gallbladder with possible underlying inflammation. Recommend HIDA scan. Will pursue further once patient is stable -lipase normal -low albumin, otherwise hepatic function panel normal  ID -abx: levaquin and vancomycin -Cultures:  -pleural fluid: no growth, elevated LDH indicating exudative  etiology  -blood cultures: no growth  Endocrine: -TSH: elevated, synthroid increased yesterday  -T4 is normal -Tube feeds started yesterday: sugar 108 this morning   DVT: heparin subQ GI:  pepcid  Family: will update when available.   Critical Care Time devoted to patient care services described in this note is 37 minutes.   Overall, patient is critically ill, prognosis is guarded.  Patient with Multiorgan failure and at high risk for cardiac arrest and death.   Jackson LatinoKarol Iann Rodier, MD

## 2018-04-13 NOTE — Plan of Care (Signed)
Patient alert and oriented this shift.  Following commands.  Tries to communicate with ETT in place.  No distress noted.  Chest tube intact at this time, H2O sealed.  Minimal drainage.  TF infusing.  No acute concerns at this time.  Will continue to monitor.

## 2018-04-13 NOTE — Progress Notes (Addendum)
[facility]                                                                                                                                                                                  Patient Demographics   Ann Cunningham, is a 77 y.o. female, DOB - 21-Dec-1940, DXI:[id number]  Admit date - 03/27/2018   Admitting Physician Dustin Flock, MD  Outpatient Primary MD for the patient is [facility] [place]   LOS - 3  Subjective:  Patient remains on the ventilator choose to still be put  Review of Systems:   CONSTITUTIONAL: Sedated  Vitals:   Vitals:   04/13/18 1145 04/13/18 1149 04/13/18 1200 04/13/18 1215  BP: 101/60 104/62 108/63 (!) 89/58  Pulse: (!) 109 (!) 108 (!) 106 (!) 108  Resp: (!) 22 (!) 21 (!) 21 (!) 21  Temp: 99.1 F (37.3 C) 99.1 F (37.3 C) 98.8 F (37.1 C) 98.6 F (37 C)  TempSrc: Core Core Core Core  SpO2:  100% 100%   Weight: 52.1 kg (114 lb 13.8 oz)     Height:        Wt Readings from Last 3 Encounters:  04/13/18 52.1 kg (114 lb 13.8 oz)  02/13/18 57.4 kg (126 lb 9.6 oz)  02/04/18 60.3 kg (133 lb)     Intake/Output Summary (Last 24 hours) at 04/13/2018 1225 Last data filed at 04/13/2018 0950 Gross per 24 hour  Intake 982.5 ml  Output 260 ml  Net 722.5 ml    Physical Exam:   GENERAL: Patient critically ill intubated HEAD, EYES, EARS, NOSE AND THROAT: Atraumatic, normocephalic. Extraocular muscles are intact. Pupils equal and reactive to light. Sclerae anicteric. No conjunctival injection. No oro-pharyngeal erythema.  NECK: Supple. There is no jugular venous distention. No bruits, no lymphadenopathy, no thyromegaly.  HEART: Regular rate and rhythm,. No murmurs, no rubs, no clicks.  LUNGS: Diminished breath sounds ABDOMEN: Soft, flat, nontender, nondistended. Has good bowel sounds. No hepatosplenomegaly appreciated.  EXTREMITIES: No evidence of any cyanosis, clubbing, or peripheral edema.  +2  pedal and radial pulses bilaterally.  NEUROLOGIC: The patient is alert, awake, and oriented x3 with no focal motor or sensory deficits appreciated bilaterally.  SKIN: Moist and warm with no rashes appreciated.  Psych: Not anxious, depressed LN: No inguinal LN enlargement    Antibiotics   Anti-infectives (From admission, onward)   Start     Dose/Rate Route Frequency Ordered Stop   04/13/18 1800  levofloxacin (LEVAQUIN) IVPB 500 mg     500 mg 100 mL/hr over 60 Minutes Intravenous Every 48 hours 04/12/18 0958     04/11/18 1300  levofloxacin (  LEVAQUIN) IVPB 500 mg  Status:  Discontinued     500 mg 100 mL/hr over 60 Minutes Intravenous Daily-1800 04/11/18 1242 04/12/18 0958   04/11/18 1200  vancomycin (VANCOCIN) 500 mg in sodium chloride 0.9 % 100 mL IVPB     500 mg 100 mL/hr over 60 Minutes Intravenous Every T-Th-Sa (Hemodialysis) 04/10/18 1606     04/10/18 1545  vancomycin (VANCOCIN) 1,250 mg in sodium chloride 0.9 % 250 mL IVPB     1,250 mg 166.7 mL/hr over 90 Minutes Intravenous  Once 04/10/18 1543 04/10/18 1915      Medications   Scheduled Meds: . aspirin  81 mg Per Tube Daily  . B-complex with vitamin C  1 tablet Per Tube QHS  . chlorhexidine gluconate (MEDLINE KIT)  15 mL Mouth Rinse BID  . Chlorhexidine Gluconate Cloth  6 each Topical Q0600  . epoetin (EPOGEN/PROCRIT) injection  10,000 Units Intravenous Q T,Th,Sa-HD  . famotidine  20 mg Per Tube Daily  . ipratropium-albuterol  3 mL Nebulization Q6H  . latanoprost  1 drop Both Eyes QHS  . levothyroxine  25 mcg Intravenous Daily  . mouth rinse  15 mL Mouth Rinse 10 times per day  . mirtazapine  7.5 mg Oral QHS  . sodium chloride flush  10-40 mL Intracatheter Q12H   Continuous Infusions: . dexmedetomidine (PRECEDEX) IV infusion 0.199 mcg/kg/hr (04/13/18 0700)  . feeding supplement (VITAL AF 1.2 CAL) 45 mL/hr at 04/13/18 0700  . levofloxacin (LEVAQUIN) IV    . norepinephrine (LEVOPHED) Adult infusion Stopped (04/11/18  1634)  . vancomycin Stopped (04/11/18 1405)   PRN Meds:.acetaminophen **OR** acetaminophen, docusate, fentaNYL (SUBLIMAZE) injection, heparin, ondansetron **OR** ondansetron (ZOFRAN) IV, pentafluoroprop-tetrafluoroeth, pentafluoroprop-tetrafluoroeth, sodium chloride flush   Data Review:   Micro Results Recent Results (from the past 240 hour(s))  MRSA PCR Screening     Status: None   Collection Time: 04/10/18  3:02 AM  Result Value Ref Range Status   MRSA by PCR NEGATIVE NEGATIVE Final    Comment:        The GeneXpert MRSA Assay (FDA approved for NASAL specimens only), is one component of a comprehensive MRSA colonization surveillance program. It is not intended to diagnose MRSA infection nor to guide or monitor treatment for MRSA infections. Performed at [facility], [address] [place], [place] [address]   CULTURE, BLOOD (ROUTINE X 2) w Reflex to ID Panel     Status: None (Preliminary result)   Collection Time: 04/10/18 12:29 PM  Result Value Ref Range Status   Specimen Description BLOOD L HAND  Final   Special Requests   Final    BOTTLES DRAWN AEROBIC AND ANAEROBIC Blood Culture adequate volume   Culture   Final    NO GROWTH 3 DAYS Performed at [facility], [address] [place], [place] [address]    Report Status PENDING  Incomplete  Body fluid culture with gram stain     Status: None (Preliminary result)   Collection Time: 04/10/18 12:40 PM  Result Value Ref Range Status   Specimen Description   Final    PLEURAL Performed at [facility], [address] [place], [place] [address]    Special Requests   Final    PLEURAL Performed at [facility], [address] [place],  [address]    Gram Stain   Final    ABUNDANT WBC PRESENT, PREDOMINANTLY MONONUCLEAR NO ORGANISMS SEEN    Culture   Final    NO  GROWTH 3 DAYS Performed at [facility], [place] [address] [place], [place] [address]    Report Status  PENDING  Incomplete  CULTURE, BLOOD (ROUTINE X 2) w Reflex to ID Panel     Status: None (Preliminary result)   Collection Time: 04/10/18  3:09 PM  Result Value Ref Range Status   Specimen Description BLOOD LT HAND  Final   Special Requests   Final    BOTTLES DRAWN AEROBIC AND ANAEROBIC Blood Culture adequate volume   Culture   Final    NO GROWTH 3 DAYS Performed at [facility], [address] [place], [place] [address]    Report Status PENDING  Incomplete    Radiology Reports Ct Abdomen Pelvis Wo Contrast  Result Date: 04/22/2018 CLINICAL DATA:  Patient refused dialysis today and presents with abdominal pain and tachycardia. EXAM: CT CHEST, ABDOMEN AND PELVIS WITHOUT CONTRAST TECHNIQUE: Multidetector CT imaging of the chest, abdomen and pelvis was performed following the standard protocol without IV contrast. COMPARISON:  CXR 04/06/2018 FINDINGS: CT CHEST FINDINGS Cardiovascular: Cardiomegaly with three-vessel coronary arteriosclerosis. Trace pericardial effusion. Minimal aortic atherosclerosis without aneurysm. Mild dilatation of the main pulmonary artery to 3 cm may reflect a component of chronic pulmonary hypertension. Mediastinum/Nodes: No thyromegaly. Midline patent trachea and mainstem bronchi. No adenopathy. Esophagus is unremarkable. Lungs/Pleura: Moderate left and moderate to large right pleural effusions with adjacent compressive atelectasis. Bronchiectasis to both lower lobes. Subpleural atelectasis in the lingula left lower lobe. Musculoskeletal: Thoracic spondylosis. No aggressive osseous lesions. CT ABDOMEN PELVIS FINDINGS Hepatobiliary: Markedly distended gallbladder with punctate dependent calculus noted. No wall thickening or pericholecystic fluid. The unenhanced liver is unremarkable. Portal vein is difficult to assess given lack of IV contrast there is some heterogeneity seen within. Pancreas: Atrophic without acute abnormality. Spleen: Normal Adrenals/Urinary Tract:  Atrophic kidneys. No nephrolithiasis nor hydroureteronephrosis. The urinary bladder demonstrates tiny layering calculi along the left posterior wall. Stomach/Bowel: Stomach is within normal limits. Appendix appears normal. No evidence of bowel wall thickening, distention, or inflammatory changes. Vascular/Lymphatic: Nonaneurysmal moderate atherosclerosis of the aorta and branch vessels. No lymphadenopathy. Reproductive: Uterus and bilateral adnexa are unremarkable. Other: Diffuse soft tissue anasarca. Phleboliths and/or injection granulomata are identified overlying the buttocks. Musculoskeletal: No acute osseous abnormality. Lumbar spondylosis with degenerative disc disease L4-5 and L5-S1 with multilevel facet arthropathy. IMPRESSION: Chest CT: 1. Cardiomegaly with 3 vessel coronary arteriosclerosis. 2. Bilateral pleural effusions with compressive atelectasis, moderate on the left and moderate to large on the right. 3. Slight dilatation of the main pulmonary artery to 3 cm compatible with chronic pulmonary hypertension. 4. Thoracic spondylosis. CT AP: 1. Marked distention of the gallbladder without pericholecystic fluid or wall thickening. Punctate dependent calculus is identified within. Findings may represent a hydropic gallbladder or a fasting state of the gallbladder counting for the distention. 2. Atrophic kidneys without obstructive uropathy. Findings would be in keeping with the patient's dialysis dependence. 3. Diffuse soft tissue anasarca. 4. Degenerative disc disease L4-5 and L5-S1. Electronically Signed   By: Ashley Royalty M.D.   On: 04/02/2018 23:20   Dg Chest 1 View  Result Date: 04/12/2018 CLINICAL DATA:  77 year old female with shortness of breath. EXAM: CHEST  1 VIEW COMPARISON:  Multiple priors, most recently chest x-ray 04/11/2018. FINDINGS: An endotracheal tube is in place with tip 2.6 cm above the carina. Nasogastric tube extends into the proximal stomach with side port just distal to the  gastroesophageal junction. Right subclavian central venous catheter with tip terminating at  the superior cavoatrial junction. Right-sided small bore chest tube in place with tip in the mid right hemithorax. Small residual right pneumothorax remains (predominantly in the right mid to lower hemithorax), decreased in size compared to yesterday's examination. Lung volumes are low. Elevation of left hemidiaphragm. Small left pleural effusion. Left basilar opacity may reflect associated atelectasis and/or consolidation. No evidence of pulmonary edema. Heart size is mildly enlarged. The patient is rotated to the left on today's exam, resulting in distortion of the mediastinal contours and reduced diagnostic sensitivity and specificity for mediastinal pathology. IMPRESSION: 1. Support apparatus, as above. 2. Decreasing small residual right pneumothorax with right-sided chest tube stable in position, as above. 3. Low lung volumes with atelectasis and/or consolidation in the left lower lobe and small left pleural effusion. Electronically Signed   By: Vinnie Langton M.D.   On: 04/12/2018 08:17   Dg Chest 1 View  Result Date: 04/10/2018 CLINICAL DATA:  Post right-sided thoracentesis. Known right-sided pneumothorax. EXAM: CHEST  1 VIEW COMPARISON:  Chest radiograph-04/17/2018; 02/12/2018; chest CT-04/14/2018 FINDINGS: Grossly unchanged enlarged cardiac silhouette and mediastinal contours. Interval reduction/resolution of right-sided pleural effusion post thoracentesis. Minimally improved aeration of the right lung base with persistent right basilar hydropneumothorax. No mediastinal shift. Minimally improved aeration the right lower lung with persistent heterogeneous/consolidative opacities. Grossly unchanged small left-sided effusion with associated left basilar heterogeneous/consolidative opacities. No new focal airspace opacities. Mild pulmonary venous congestion. No acute osseus abnormalities. IMPRESSION: 1. Interval  reduction/resolution of right-sided effusion post thoracentesis with residual right basilar pneumothorax. 2. Unchanged small left-sided effusion with associated left basilar heterogeneous/consolidative opacities, atelectasis versus infiltrate. 3. Minimally improved aeration of the right lung base. Critical Value/emergent results were called by telephone at the time of interpretation on 04/10/2018 at 11:09 am to Dr. Posey Pronto, who verbally acknowledged these results. Electronically Signed   By: Sandi Mariscal M.D.   On: 04/10/2018 11:08   Dg Abd 1 View  Result Date: 04/10/2018 CLINICAL DATA:  Orogastric tube placement EXAM: ABDOMEN - 1 VIEW COMPARISON:  None. FINDINGS: Orogastric tube tip and side port project over the stomach. Bilateral pleural effusions with associated atelectasis. Prominent small bowel in the central abdomen. IMPRESSION: Orogastric tube tip and side port projecting over the stomach. Electronically Signed   By: Ulyses Jarred M.D.   On: 04/10/2018 14:53   Ct Chest Wo Contrast  Result Date: 04/11/2018 CLINICAL DATA:  Patient refused dialysis today and presents with abdominal pain and tachycardia. EXAM: CT CHEST, ABDOMEN AND PELVIS WITHOUT CONTRAST TECHNIQUE: Multidetector CT imaging of the chest, abdomen and pelvis was performed following the standard protocol without IV contrast. COMPARISON:  CXR 04/18/2018 FINDINGS: CT CHEST FINDINGS Cardiovascular: Cardiomegaly with three-vessel coronary arteriosclerosis. Trace pericardial effusion. Minimal aortic atherosclerosis without aneurysm. Mild dilatation of the main pulmonary artery to 3 cm may reflect a component of chronic pulmonary hypertension. Mediastinum/Nodes: No thyromegaly. Midline patent trachea and mainstem bronchi. No adenopathy. Esophagus is unremarkable. Lungs/Pleura: Moderate left and moderate to large right pleural effusions with adjacent compressive atelectasis. Bronchiectasis to both lower lobes. Subpleural atelectasis in the lingula  left lower lobe. Musculoskeletal: Thoracic spondylosis. No aggressive osseous lesions. CT ABDOMEN PELVIS FINDINGS Hepatobiliary: Markedly distended gallbladder with punctate dependent calculus noted. No wall thickening or pericholecystic fluid. The unenhanced liver is unremarkable. Portal vein is difficult to assess given lack of IV contrast there is some heterogeneity seen within. Pancreas: Atrophic without acute abnormality. Spleen: Normal Adrenals/Urinary Tract: Atrophic kidneys. No nephrolithiasis nor hydroureteronephrosis. The urinary bladder demonstrates tiny layering calculi  along the left posterior wall. Stomach/Bowel: Stomach is within normal limits. Appendix appears normal. No evidence of bowel wall thickening, distention, or inflammatory changes. Vascular/Lymphatic: Nonaneurysmal moderate atherosclerosis of the aorta and branch vessels. No lymphadenopathy. Reproductive: Uterus and bilateral adnexa are unremarkable. Other: Diffuse soft tissue anasarca. Phleboliths and/or injection granulomata are identified overlying the buttocks. Musculoskeletal: No acute osseous abnormality. Lumbar spondylosis with degenerative disc disease L4-5 and L5-S1 with multilevel facet arthropathy. IMPRESSION: Chest CT: 1. Cardiomegaly with 3 vessel coronary arteriosclerosis. 2. Bilateral pleural effusions with compressive atelectasis, moderate on the left and moderate to large on the right. 3. Slight dilatation of the main pulmonary artery to 3 cm compatible with chronic pulmonary hypertension. 4. Thoracic spondylosis. CT AP: 1. Marked distention of the gallbladder without pericholecystic fluid or wall thickening. Punctate dependent calculus is identified within. Findings may represent a hydropic gallbladder or a fasting state of the gallbladder counting for the distention. 2. Atrophic kidneys without obstructive uropathy. Findings would be in keeping with the patient's dialysis dependence. 3. Diffuse soft tissue anasarca. 4.  Degenerative disc disease L4-5 and L5-S1. Electronically Signed   By: Ashley Royalty M.D.   On: 04/08/2018 23:20   Dg Chest Port 1 View  Result Date: 04/13/2018 CLINICAL DATA:  Acute respiratory failure. EXAM: PORTABLE CHEST 1 VIEW COMPARISON:  Chest x-ray from yesterday. FINDINGS: Unchanged endotracheal tube, enteric tube, right subclavian central venous catheter, and right pigtail chest tube. Stable cardiomediastinal silhouette. Normal pulmonary vascularity. Unchanged small residual right pneumothorax in the mid to lower hemithorax. Unchanged volume loss in the right lower lobe. Unchanged small left pleural effusion with left lower lobe airspace disease. No acute osseous abnormality. IMPRESSION: 1. Unchanged small residual right pneumothorax and volume loss in the right lower lobe. 2. Unchanged small left pleural effusion with adjacent left lower lobe atelectasis/infiltrate. 3. Stable support apparatus. Electronically Signed   By: Titus Dubin M.D.   On: 04/13/2018 08:56   Dg Chest Port 1 View  Result Date: 04/11/2018 CLINICAL DATA:  Intubation. EXAM: PORTABLE CHEST 1 VIEW COMPARISON:  04/10/2018. FINDINGS: Endotracheal tube, NG tube, right subclavian line, right chest tube in stable position. Stable cardiomegaly. Persistent bibasilar atelectasis/infiltrates and bilateral pleural effusions. Persistent right-sided pneumothorax with slight interim improvement. IMPRESSION: 1. Lines and tubes including right chest tube in stable position. Slight improvement of right-sided pneumothorax. 2. Bibasilar atelectasis and bilateral pleural effusions, left side greater than right again noted. No significant change from prior exam. Electronically Signed   By: Marcello Moores  Register   On: 04/11/2018 07:37   Dg Chest Port 1 View  Result Date: 04/10/2018 CLINICAL DATA:  Post intubation and right-sided chest tube placement EXAM: PORTABLE CHEST 1 VIEW COMPARISON:  Earlier same day (multiple examinations) 04/08/2018; chest  CT-04/09/2028 FINDINGS: Grossly unchanged cardiac silhouette and mediastinal contours with partial obscuration of the left heart border secondary to unchanged small left-sided effusion associated left basilar heterogeneous/consolidative opacities. No new focal airspace opacities. Endotracheal tube overlies the tracheal air column with tip superior to the carina. Enteric tube tip and side port project below the left hemidiaphragm. Interval placement of right subclavian vein approach central venous catheter with tip projected over the superior cavoatrial junction. Interval placement of a right lateral chest tube with reduction/resolution of right-sided effusion and unchanged small right basilar hydropneumothorax. No mediastinal shift. No acute osseus abnormalities. Degenerative change of the right glenohumeral joint is suspected though incompletely evaluated. IMPRESSION: 1. Interval placement of right-sided chest tube with reduction/resolution of right-sided effusion post thoracentesis in grossly  unchanged small right basilar hydropneumothorax. 2. Right subclavian vein approach central venous catheter tip projects over the superior cavoatrial junction. 3. Stable position of remaining support apparatus. 4. Unchanged small left-sided effusion associated left basilar opacities, atelectasis versus infiltrate. No new focal airspace opacities. Electronically Signed   By: Sandi Mariscal M.D.   On: 04/10/2018 14:47   Portable Chest X-ray  Result Date: 04/10/2018 CLINICAL DATA:  77 year old female with a history of intubation. EXAM: PORTABLE CHEST 1 VIEW COMPARISON:  04/10/2018, 02/12/2018, chest CT 03/27/2018 FINDINGS: Cardiomediastinal silhouette unchanged in size and contour, with the heart borders partially obscured by overlying lung/pleural disease. Dense opacities at the bilateral lung bases, worst on the left. Small right-sided pneumothorax persists. Interval placement of endotracheal tube which terminates  approximately 3.2 cm above the carina. Interval placement of gastric tube terminating in the left upper abdomen out of the field of view. IMPRESSION: Interval placement of endotracheal tube, terminating above the carina approximately 3.2 cm. Interval placement of gastric tube. Similar appearance of right hydropneumothorax, unchanged from the prior. Dense opacity at the left base, likely a combination of atelectasis/consolidation, pleural fluid, and/or edema. Electronically Signed   By: Corrie Mckusick D.O.   On: 04/10/2018 13:23   Dg Chest Port 1 View  Result Date: 04/10/2018 CLINICAL DATA:  Post recent right-sided thoracentesis with persistent respiratory distress. EXAM: PORTABLE CHEST 1 VIEW COMPARISON:  Earlier same day; 04/22/2018; chest CT-04/17/2018 FINDINGS: Grossly unchanged enlarged cardiac silhouette and mediastinal contours. Grossly unchanged small loculated right basilar hydropneumothorax post recent thoracentesis. No midline shift. Unchanged small left-sided effusion with associated left basilar opacities. No new focal airspace opacities. Mild pulmonary venous congestion without frank evidence of edema. No acute osseus abnormalities. No acute osseus abnormalities. IMPRESSION: 1. Grossly unchanged small loculated right basilar hydropneumothorax post recent thoracentesis. 2. Unchanged small left-sided effusion associated left basilar opacities, atelectasis versus infiltrate. 3. Pulmonary venous congestion without frank evidence of edema. Electronically Signed   By: Sandi Mariscal M.D.   On: 04/10/2018 12:25   Dg Chest Portable 1 View  Result Date: 04/02/2018 CLINICAL DATA:  Shortness of breath. Refused dialysis today. History of CHF. EXAM: PORTABLE CHEST 1 VIEW COMPARISON:  Chest radiograph Feb 12, 2018 FINDINGS: Stable cardiomegaly. Pulmonary vascular congestion. Similar moderate to large bilateral pleural effusions. Biapical pleural capping. Prominent pleural reflection mid lung zone. No mediastinal  shift. Soft tissue planes and included osseous structures are nonacute. High riding bilateral humeral heads seen with old rotator cuff injuries. IMPRESSION: 1. Moderate to large pleural effusions, possible RIGHT hydropneumothorax without mediastinal shift. Similar underlying consolidation. 2. Stable cardiomegaly and pulmonary vascular congestion. 3. Acute findings discussed with and reconfirmed by Dr.GRAYDON GOODMAN on 03/28/2018 at 9:49 pm. Electronically Signed   By: Elon Alas M.D.   On: 04/16/2018 21:49   US Abdomen Limited Ruq  Result Date: 04/11/2018 CLINICAL DATA:  Question of gallstones on recent CT EXAM: ULTRASOUND ABDOMEN LIMITED RIGHT UPPER QUADRANT COMPARISON:  CT abdomen and pelvis April 09, 2018 FINDINGS: Gallbladder: Gallbladder appears somewhat distended with irregularities along the gallbladder wall. Sludge is seen in the gallbladder. There are no convincing echogenic foci which move and shadow as is expected with gallstones. There is no pericholecystic fluid. No sonographic Murphy sign noted by sonographer. Common bile duct: Diameter: 4 mm. No intrahepatic or extrahepatic biliary duct dilatation. Liver: No focal lesion identified. Within normal limits in parenchymal echogenicity. Portal vein is patent on color Doppler imaging with normal direction of blood flow towards the liver. Trace ascites.  IMPRESSION: 1. Sludge in gallbladder. No convincing gallstones. Tiny gallstones could be obscured by sludge. The gallbladder is somewhat distended with mild irregularity along the gallbladder wall. Gallbladder wall is not frankly thickened, however. Question underlying inflammation within the gallbladder wall. This combination of findings may warrant correlation with nuclear medicine hepatobiliary imaging study to assess for cystic duct patency. 2.  Slight ascites. 3.  Study otherwise unremarkable. Electronically Signed   By: Lowella Grip III M.D.   On: 04/11/2018 08:59   US Thoracentesis Asp  Pleural Space W/img Guide  Result Date: 04/10/2018 INDICATION: Symptomatic right sided pleural effusion. Note, patient has known right basilar hydropneumothorax demonstrated on preceding chest radiograph and chest CT. EXAM: US THORACENTESIS ASP PLEURAL SPACE W/IMG GUIDE COMPARISON:  Chest radiograph-04/14/2018; chest CT-03/25/2018; MEDICATIONS: None. COMPLICATIONS: SIR LEVEL B - Normal therapy, includes overnight admission for observation. Patient experienced a sudden transient decreased oxygen saturation to the level of 40s necessitating calling of a rapid response, however her oxygen saturation levels normalized to the mid 90s with the administration of supplement total oxygen and postprocedural chest radiograph demonstrated resolution of the right-sided effusion with no change in the size of the right basilar pneumothorax and no additional intervention was required. TECHNIQUE: Informed written consent was obtained from the patient after a discussion of the risks, benefits and alternatives to treatment. A timeout was performed prior to the initiation of the procedure. With the patient positioned left lateral decubitus, initial ultrasound scanning demonstrates a moderate right-sided anechoic pleural effusion. The lower chest was prepped and draped in the usual sterile fashion. 1% lidocaine was used for local anesthesia. An ultrasound image was saved for documentation purposes. An 8 Fr Safe-T-Centesis catheter was introduced. The thoracentesis was performed. The catheter was removed and a dressing was applied. Patient experienced a sudden transient decreased oxygen saturation to the level of 40s necessitating calling of a rapid response, however her oxygen saturation levels normalized to the mid 90s with the administration of supplement total oxygen. The patient had a portable upright chest radiograph which demonstrated resolution of the right-sided effusion with no change in the size of the right basilar  pneumothorax and no additional intervention was required. FINDINGS: A total of approximately 400 cc of blood-tinged fluid was removed. IMPRESSION: Successful ultrasound-guided right sided thoracentesis yielding 400 cc of blood tinged pleural fluid. Patient experienced a sudden transient decreased oxygen saturation to the level of 40s necessitating calling of a rapid response, however her oxygen saturation levels normalized to the mid 90s with the administration of supplement total oxygen and postprocedural chest radiograph demonstrated resolution of the right-sided effusion with no change in the size of the right basilar pneumothorax and no additional intervention was required. Electronically Signed   By: Sandi Mariscal M.D.   On: 04/10/2018 11:20     CBC Recent Labs  Lab 04/04/2018 2125 04/10/18 0417 04/10/18 1229 04/11/18 0445 04/12/18 0405 04/13/18 0608  WBC 7.1 6.2 11.6* 10.0 12.0* 12.3*  HGB 10.1* 11.0* 10.2* 9.6* 8.9* 8.3*  HCT 31.2* 33.6* 32.3* 29.4* 27.6* 25.9*  PLT 389 412 413 355 261 236  MCV 86.5 87.2 88.8 87.3 87.8 86.9  MCH 28.0 28.4 28.1 28.6 28.2 27.8  MCHC 32.4 32.6 31.6* 32.8 32.1 32.0  RDW 17.6* 18.0* 17.4* 17.1* 17.7* 17.4*  LYMPHSABS 1.3  --   --  0.3* 0.7*  --   MONOABS 0.8  --   --  0.7 0.9  --   EOSABS 0.1  --   --  0.0  0.0  --   BASOSABS 0.1  --   --  0.1 0.0  --     Chemistries  Recent Labs  Lab 04/10/18 0001 04/10/18 0417 04/10/18 1229 04/11/18 0445 04/12/18 0405 04/13/18 0608  NA 141 139 138 142 142 140  K 4.8 4.7 4.7 4.9 4.5 4.2  CL 102 101 102 105 104 100  CO2 29 27 28 27 29 31   GLUCOSE 113* 97 225* 143* 108* 111*  BUN 71* 73* 74* 77* 53* 47*  CREATININE 4.09* 4.18* 4.12* 4.21* 3.24* 2.59*  CALCIUM 8.6* 8.4* 8.2* 8.5* 8.2* 7.7*  MG  --   --  2.3 2.3 1.9 2.2  AST 30  --  33 31 32 59*  ALT 21  --  22 19 20  42  ALKPHOS 118  --  120 105 102 120  BILITOT 0.7  --  0.7 0.7 0.7 0.6    ------------------------------------------------------------------------------------------------------------------ estimated creatinine clearance is 14.4 mL/min (A) (by C-G formula based on SCr of 2.59 mg/dL (H)). ------------------------------------------------------------------------------------------------------------------ No results for input(s): HGBA1C in the last 72 hours. ------------------------------------------------------------------------------------------------------------------ No results for input(s): CHOL, HDL, LDLCALC, TRIG, CHOLHDL, LDLDIRECT in the last 72 hours. ------------------------------------------------------------------------------------------------------------------ Recent Labs    04/10/18 1229  TSH 7.642*   ------------------------------------------------------------------------------------------------------------------ No results for input(s): VITAMINB12, FOLATE, FERRITIN, TIBC, IRON, RETICCTPCT in the last 72 hours.  Coagulation profile Recent Labs  Lab 04/11/18 0445 04/12/18 0405  INR 1.13 1.12    No results for input(s): DDIMER in the last 72 hours.  Cardiac Enzymes Recent Labs  Lab 04/10/18 1229 04/10/18 1827 04/11/18 0046  TROPONINI <0.03 0.07* 0.07*   ------------------------------------------------------------------------------------------------------------------ Invalid input(s): POCBNP    Assessment & Plan  Patient is a 77 year old white female end-stage renal disease  1.  Acute respiratory failure status post thoracentesis Patient had pneumothorax now with chest tube in Continue ventilator  2.  Bilateral pleural effusion exudative nature no growth Status post drainage  3.  End-stage renal disease hemodialysis per nephrology not be able to do it today  4.  Hypothyroidism continue Synthroid  5.  History of CHF with dialysis per nephrology  6.  CODE STATUS full code prognosis poor        Code Status Orders  (From  admission, onward)        Start     Ordered   04/10/18 0253  Full code  Continuous     04/10/18 0252    Code Status History    Date Active Date Inactive Code Status Order ID Comments User Context   02/12/2018 1528 02/13/2018 1931 Full Code [id number]  Dustin Flock, MD ED   01/10/2018 1536 01/13/2018 0045 Full Code [id number]  Loletha Grayer, MD ED   10/19/2017 1310 10/20/2017 2106 Full Code [id number]  Bettey Costa, MD Inpatient   08/17/2017 2122 08/22/2017 2014 DNR [id number]  Loletha Grayer, MD ED   08/14/2017 0459 08/15/2017 1941 Full Code [id number]  Harrie Foreman, MD Inpatient           Consults intensivist, nephrology  DVT Prophylaxis heparin  Lab Results  Component Value Date   PLT 236 04/13/2018     Time Spent in minutes   32 minutes spent  Greater than 50% of time spent in care coordination and counseling patient regarding the condition and plan of care.   Dustin Flock M.D on 04/13/2018 at 12:25 PM  Between 7am to 6pm - Pager - [phone]  After 6pm go to [email or web address] - [facility]  Office  [phone]

## 2018-04-14 ENCOUNTER — Inpatient Hospital Stay: Payer: Medicare Other

## 2018-04-14 DIAGNOSIS — I429 Cardiomyopathy, unspecified: Secondary | ICD-10-CM

## 2018-04-14 DIAGNOSIS — J939 Pneumothorax, unspecified: Secondary | ICD-10-CM

## 2018-04-14 LAB — RENAL FUNCTION PANEL
ALBUMIN: 2.2 g/dL — AB (ref 3.5–5.0)
ANION GAP: 8 (ref 5–15)
BUN: 36 mg/dL — ABNORMAL HIGH (ref 8–23)
CALCIUM: 7.6 mg/dL — AB (ref 8.9–10.3)
CO2: 30 mmol/L (ref 22–32)
Chloride: 99 mmol/L (ref 98–111)
Creatinine, Ser: 1.88 mg/dL — ABNORMAL HIGH (ref 0.44–1.00)
GFR calc non Af Amer: 25 mL/min — ABNORMAL LOW (ref >60)
GFR, EST AFRICAN AMERICAN: 29 mL/min — AB (ref >60)
Glucose, Bld: 125 mg/dL — ABNORMAL HIGH (ref 70–99)
PHOSPHORUS: 1.7 mg/dL — AB (ref 2.5–4.6)
POTASSIUM: 3.8 mmol/L (ref 3.5–5.1)
SODIUM: 137 mmol/L (ref 135–145)

## 2018-04-14 LAB — GLUCOSE, CAPILLARY
GLUCOSE-CAPILLARY: 122 mg/dL — AB (ref 70–99)
GLUCOSE-CAPILLARY: 95 mg/dL (ref 70–99)
GLUCOSE-CAPILLARY: 95 mg/dL (ref 70–99)
Glucose-Capillary: 96 mg/dL (ref 70–99)
Glucose-Capillary: 88 mg/dL (ref 70–99)
Glucose-Capillary: 108 mg/dL — ABNORMAL HIGH (ref 70–99)

## 2018-04-14 LAB — CBC WITH DIFFERENTIAL/PLATELET
BASOS ABS: 0 10*3/uL (ref 0–0.1)
BASOS PCT: 0 %
Eosinophils Absolute: 0 10*3/uL (ref 0–0.7)
Eosinophils Relative: 0 %
HEMATOCRIT: 25.4 % — AB (ref 35.0–47.0)
HEMOGLOBIN: 8.2 g/dL — AB (ref 12.0–16.0)
LYMPHS PCT: 5 %
Lymphs Abs: 0.7 10*3/uL — ABNORMAL LOW (ref 1.0–3.6)
MCH: 28.3 pg (ref 26.0–34.0)
MCHC: 32.4 g/dL (ref 32.0–36.0)
MCV: 87.2 fL (ref 80.0–100.0)
Monocytes Absolute: 1.1 10*3/uL — ABNORMAL HIGH (ref 0.2–0.9)
Monocytes Relative: 8 %
NEUTROS ABS: 11.8 10*3/uL — AB (ref 1.4–6.5)
NEUTROS PCT: 87 %
Platelets: 247 10*3/uL (ref 150–440)
RBC: 2.91 MIL/uL — ABNORMAL LOW (ref 3.80–5.20)
RDW: 16.9 % — ABNORMAL HIGH (ref 11.5–14.5)
WBC: 13.6 10*3/uL — ABNORMAL HIGH (ref 3.6–11.0)

## 2018-04-14 LAB — PHOSPHORUS: PHOSPHORUS: 1.7 mg/dL — AB (ref 2.5–4.6)

## 2018-04-14 LAB — MAGNESIUM: Magnesium: 1.7 mg/dL (ref 1.7–2.4)

## 2018-04-14 MED ORDER — MIDODRINE HCL 5 MG PO TABS
5.0000 mg | ORAL_TABLET | Freq: Three times a day (TID) | ORAL | Status: DC
  Administered 2018-04-14 – 2018-04-15 (×2): 5 mg via NASOGASTRIC
  Filled 2018-04-14 (×3): qty 1

## 2018-04-14 MED ORDER — STERILE WATER FOR INJECTION IJ SOLN
INTRAMUSCULAR | Status: AC
  Administered 2018-04-14: 10 mL
  Filled 2018-04-14: qty 10

## 2018-04-14 MED ORDER — FENTANYL 2500MCG IN NS 250ML (10MCG/ML) PREMIX INFUSION
0.0000 ug/h | INTRAVENOUS | Status: DC
  Administered 2018-04-14: 25 ug/h via INTRAVENOUS
  Administered 2018-04-15: 10 ug/h via INTRAVENOUS
  Administered 2018-04-15: 125 ug/h via INTRAVENOUS
  Administered 2018-04-15 – 2018-04-16 (×2): 200 ug/h via INTRAVENOUS
  Administered 2018-04-16: 175 ug/h via INTRAVENOUS
  Administered 2018-04-16: 150 ug/h via INTRAVENOUS
  Administered 2018-04-17: 300 ug/h via INTRAVENOUS
  Administered 2018-04-17 – 2018-04-19 (×8): 400 ug/h via INTRAVENOUS
  Filled 2018-04-14 (×14): qty 250

## 2018-04-14 MED ORDER — SODIUM PHOSPHATES 45 MMOLE/15ML IV SOLN
30.0000 mmol | Freq: Once | INTRAVENOUS | Status: AC
  Administered 2018-04-14: 30 mmol via INTRAVENOUS
  Filled 2018-04-14: qty 10

## 2018-04-14 MED ORDER — SODIUM CHLORIDE 0.9 % IV BOLUS
500.0000 mL | Freq: Once | INTRAVENOUS | Status: AC
  Administered 2018-04-14: 500 mL via INTRAVENOUS

## 2018-04-14 NOTE — Progress Notes (Signed)
Pt +FC, eye contact with voice arousal and nods head appropriately to questions. Pt transitioned to fentanyl from precedex d/t c/o throat pain->levo restarted for MAP support. Pt has remained in NSR with frequent multifocal PVCs, BP labile, HR tachycardic in low 100s. Pt remaines on PRVC. Pt tolerated volume support for about 4 hours today although required high pressure. Pt has a large air leak in Right chest tube->Dr Peggye Pittichards made aware->no new orders at this time-pt chest tube is hooked up to -20cm suction.

## 2018-04-14 NOTE — Progress Notes (Signed)
[facility]  ROUNDING NOTE   Subjective:   Hemodialysis treatment yesterday. Tolerated treatment well. UF of 444m.   Given 5073mNS bolus overnight.   Off norepinephrine.   Objective:  Vital signs in last 24 hours:  Temp:  [97.2 F (36.2 C)-99.1 F (37.3 C)] 97.9 F (36.6 C) (07/21 0600) Pulse Rate:  [89-153] 99 (07/21 0600) Resp:  [13-23] 22 (07/21 0600) BP: (63-116)/(43-65) 87/56 (07/21 0600) SpO2:  [92 %-100 %] 100 % (07/21 0821) FiO2 (%):  [30 %] 30 % (07/21 0821) Weight:  [48.6 kg (107 lb 2.3 oz)-52.1 kg (114 lb 13.8 oz)] 48.6 kg (107 lb 2.3 oz) (07/21 0430)  Weight change: 3 kg (6 lb 9.8 oz) Filed Weights   04/13/18 1145 04/13/18 1500 04/14/18 0430  Weight: 52.1 kg (114 lb 13.8 oz) 51.7 kg (113 lb 15.7 oz) 48.6 kg (107 lb 2.3 oz)    Intake/Output: I/O last 3 completed shifts: In: 2199.6 [I.V.:271.2; NG/GT:1710; IV Piggyback:218.4] Out: 603 [Other:403; Chest Tube:200]   Intake/Output this shift:  No intake/output data recorded.  Physical Exam: General:  Ill  Head: ETT, OGT  Eyes: Eyes open, PERRL  Neck: supple  Lungs:  Clear, Pressure support FiO2 30%, left chest tube  Heart: tachycardia  Abdomen:  Soft, nontender  Extremities:  no peripheral edema.  Neurologic: Intubated, off sedation  Skin: No lesions  Access: Right AVF    Basic Metabolic Panel: Recent Labs  Lab 04/10/18 1229 04/11/18 0445 04/12/18 0405 04/13/18 0608 04/14/18 0424  NA 138 142 142 140 137  K 4.7 4.9 4.5 4.2 3.8  CL 102 105 104 100 99  CO2 28 27 29 31 30   GLUCOSE 225* 143* 108* 111* 125*  BUN 74* 77* 53* 47* 36*  CREATININE 4.12* 4.21* 3.24* 2.59* 1.88*  CALCIUM 8.2* 8.5* 8.2* 7.7* 7.6*  MG 2.3 2.3 1.9 2.2 1.7  PHOS  --  5.4* 4.1 2.6 1.7*  1.7*    Liver Function Tests: Recent Labs  Lab 04/10/18 0001 04/10/18 1229 04/11/18 0445 04/12/18 0405 04/13/18 0608 04/14/18 0424  AST 30 33 31 32 59*  --   ALT 21 22 19 20  42  --   ALKPHOS 118 120 105 102 120   --   BILITOT 0.7 0.7 0.7 0.7 0.6  --   PROT 6.6 6.8 6.5 6.4* 5.9*  --   ALBUMIN 2.9* 2.9* 2.8* 2.7* 2.4* 2.2*   Recent Labs  Lab 04/11/18 0445  LIPASE 29   Recent Labs  Lab 04/11/18 0643  AMMONIA 15    CBC: Recent Labs  Lab 04/20/2018 2125  04/10/18 1229 04/11/18 0445 04/12/18 0405 04/13/18 0608 04/14/18 0424  WBC 7.1   < > 11.6* 10.0 12.0* 12.3* 13.6*  NEUTROABS 4.8  --   --  8.9* 10.4*  --  11.8*  HGB 10.1*   < > 10.2* 9.6* 8.9* 8.3* 8.2*  HCT 31.2*   < > 32.3* 29.4* 27.6* 25.9* 25.4*  MCV 86.5   < > 88.8 87.3 87.8 86.9 87.2  PLT 389   < > 413 355 261 236 247   < > = values in this interval not displayed.    Cardiac Enzymes: Recent Labs  Lab 04/10/18 0001 04/10/18 1229 04/10/18 1827 04/11/18 0046  TROPONINI <0.03 <0.03 0.07* 0.07*    BNP: Invalid input(s): POCBNP  CBG: Recent Labs  Lab 04/13/18 1612 04/13/18 1923 04/13/18 2327 04/14/18 0330 04/14/18 0744  GLUCAP 117* 113* 98 96 122*    Microbiology:  Results for orders placed or performed during the hospital encounter of 04/08/2018  MRSA PCR Screening     Status: None   Collection Time: 04/10/18  3:02 AM  Result Value Ref Range Status   MRSA by PCR NEGATIVE NEGATIVE Final    Comment:        The GeneXpert MRSA Assay (FDA approved for NASAL specimens only), is one component of a comprehensive MRSA colonization surveillance program. It is not intended to diagnose MRSA infection nor to guide or monitor treatment for MRSA infections. Performed at [facility], [address] [place], [place] [address]   CULTURE, BLOOD (ROUTINE X 2) w Reflex to ID Panel     Status: None (Preliminary result)   Collection Time: 04/10/18 12:29 PM  Result Value Ref Range Status   Specimen Description BLOOD L HAND  Final   Special Requests   Final    BOTTLES DRAWN AEROBIC AND ANAEROBIC Blood Culture adequate volume   Culture   Final    NO GROWTH 4 DAYS Performed at [facility], [address], [place], [place] [address]    Report Status PENDING  Incomplete  Body fluid culture with gram stain     Status: None   Collection Time: 04/10/18 12:40 PM  Result Value Ref Range Status   Specimen Description   Final    PLEURAL Performed at [facility], [address], [place], [place] [address]    Special Requests   Final    PLEURAL Performed at [facility], [address] [place], [place] [address]    Gram Stain   Final    ABUNDANT WBC PRESENT, PREDOMINANTLY MONONUCLEAR NO ORGANISMS SEEN    Culture   Final    NO GROWTH 3 DAYS Performed at [facility], [place] [address], [place], [place] [address]    Report Status 04/13/2018 FINAL  Final  CULTURE, BLOOD (ROUTINE X 2) w Reflex to ID Panel     Status: None (Preliminary result)   Collection Time: 04/10/18  3:09 PM  Result Value Ref Range Status   Specimen Description BLOOD LT HAND  Final   Special Requests   Final    BOTTLES DRAWN AEROBIC AND ANAEROBIC Blood Culture adequate volume   Culture   Final    NO GROWTH 4 DAYS Performed at [facility], [address], [place], [place] [address]    Report Status PENDING  Incomplete    Coagulation Studies: Recent Labs    04/12/18 0405  LABPROT 14.3  INR 1.12    Urinalysis: No results for input(s): COLORURINE, LABSPEC, PHURINE, GLUCOSEU, HGBUR, BILIRUBINUR, KETONESUR, PROTEINUR, UROBILINOGEN, NITRITE, LEUKOCYTESUR in the last 72 hours.  Invalid input(s): APPERANCEUR    Imaging: Dg Chest Port 1 View  Result Date: 04/14/2018 CLINICAL DATA:  Follow-up pneumothorax. EXAM: PORTABLE CHEST 1 VIEW COMPARISON:  Yesterday. FINDINGS: An approximately 15% right basilar pneumothorax has not changed significantly. A right pleural pigtail catheter remains in place. Right subclavian catheter tip in the superior vena cava. Endotracheal tube in satisfactory position. Nasogastric tube tip and side hole in the proximal stomach. The cardiac silhouette  remains borderline enlarged. There has been no significant change in left basilar airspace opacity and pleural fluid. Decreased ill-defined opacity at the right lung base. Thoracic spine degenerative changes. IMPRESSION: 1. No significant change in left basilar atelectasis or pneumonia and left pleural fluid. 2. Stable approximately 15% right basilar pneumothorax. 3. Decreased right basilar atelectasis and fluid. Electronically Signed  By: Claudie Revering M.D.   On: 04/14/2018 08:00   Dg Chest Port 1 View  Result Date: 04/13/2018 CLINICAL DATA:  Acute respiratory failure. EXAM: PORTABLE CHEST 1 VIEW COMPARISON:  Chest x-ray from yesterday. FINDINGS: Unchanged endotracheal tube, enteric tube, right subclavian central venous catheter, and right pigtail chest tube. Stable cardiomediastinal silhouette. Normal pulmonary vascularity. Unchanged small residual right pneumothorax in the mid to lower hemithorax. Unchanged volume loss in the right lower lobe. Unchanged small left pleural effusion with left lower lobe airspace disease. No acute osseous abnormality. IMPRESSION: 1. Unchanged small residual right pneumothorax and volume loss in the right lower lobe. 2. Unchanged small left pleural effusion with adjacent left lower lobe atelectasis/infiltrate. 3. Stable support apparatus. Electronically Signed   By: Titus Dubin M.D.   On: 04/13/2018 08:56     Medications:   . dexmedetomidine (PRECEDEX) IV infusion Stopped (04/14/18 0756)  . feeding supplement (VITAL AF 1.2 CAL) 45 mL/hr at 04/14/18 0600  . levofloxacin (LEVAQUIN) IV Stopped (04/13/18 1838)  . norepinephrine (LEVOPHED) Adult infusion Stopped (04/14/18 0756)  . sodium phosphate  Dextrose 5% IVPB 43 mL/hr at 04/14/18 0600  . vancomycin Stopped (04/13/18 1505)   . aspirin  81 mg Per Tube Daily  . B-complex with vitamin C  1 tablet Per Tube QHS  . chlorhexidine gluconate (MEDLINE KIT)  15 mL Mouth Rinse BID  . Chlorhexidine Gluconate Cloth  6 each  Topical Q0600  . epoetin (EPOGEN/PROCRIT) injection  10,000 Units Intravenous Q T,Th,Sa-HD  . famotidine  20 mg Per Tube Daily  . ipratropium-albuterol  3 mL Nebulization Q6H  . latanoprost  1 drop Both Eyes QHS  . levothyroxine  25 mcg Intravenous Daily  . mouth rinse  15 mL Mouth Rinse 10 times per day  . mirtazapine  7.5 mg Oral QHS  . sodium chloride flush  10-40 mL Intracatheter Q12H   acetaminophen **OR** acetaminophen, docusate, fentaNYL (SUBLIMAZE) injection, heparin, ondansetron **OR** ondansetron (ZOFRAN) IV, pentafluoroprop-tetrafluoroeth, pentafluoroprop-tetrafluoroeth, sodium chloride flush  Assessment/ Plan:  [name] is a 77 y.o. black female with end stage renal disease on hemodialysis TTS, hypertension, tobacco abuse, glaucoma with blindness, hypothyroidism, congestive heart failure who is admitted to [facility] on 04/05/2018  Patient with large volume right sided thoracentesis on 7/17 by radiology, however patient developed a pneumothorax and required mechanical ventilation and intubation. Chest tube placed  [facility] TTS [facility] EDW 51.5kg Right AVF  1. End Stage Renal Disease: hemodialysis treatment yesterday. Continue TTS schedule. Uses 17 ga needles and BFR of 300.   2. Hypotension: chronic. On midodrine as outpatient.  - off vasopressors.   3. Anemia of chronic kidney disease: hemoglobin 8.2 - EPO with TTS treatments.   4. Secondary Hyperparathyroidism: Labs from 7/9: PTH 219, calcium 8.2 phosphorus 3.8. Does not take binders.  - Requiring phosphorus replacement.   Appreciate palliative input. Patient has told several family members she is not interested in aggressive measures and unsure how long she wants to continue dialysis.    LOS: 4 Ann Cunningham 7/21/20198:25 AM

## 2018-04-14 NOTE — Progress Notes (Addendum)
[facility] [facility], [place]                                                                                                                                                                                  Patient Demographics   Ann Cunningham, is a 77 y.o. female, DOB - [birth year], OFB:[id number]  Admit date - 03/27/2018   Admitting Physician Dustin Flock, MD  Outpatient Primary MD for the patient is [facility]   LOS - 4  Subjective:  Chest tube still in place, remains on the ventilator  Review of Systems:   CONSTITUTIONAL: Sedated  Vitals:   Vitals:   04/14/18 0600 04/14/18 0700 04/14/18 0800 04/14/18 0821  BP: (!) 87/56 (!) 80/57 (!) 100/57   Pulse: 99  (!) 106   Resp: (!) 22 15 (!) 24   Temp: 97.9 F (36.6 C) 97.7 F (36.5 C) 97.7 F (36.5 C)   TempSrc:   Bladder   SpO2: 100%  100% 100%  Weight:      Height:        Wt Readings from Last 3 Encounters:  04/14/18 48.6 kg (107 lb 2.3 oz)  02/13/18 57.4 kg (126 lb 9.6 oz)  02/04/18 60.3 kg (133 lb)     Intake/Output Summary (Last 24 hours) at 04/14/2018 1159 Last data filed at 04/14/2018 0756 Gross per 24 hour  Intake 1529.8 ml  Output 493 ml  Net 1036.8 ml    Physical Exam:   GENERAL: Patient critically ill intubated HEAD, EYES, EARS, NOSE AND THROAT: Atraumatic, normocephalic. Extraocular muscles are intact. Pupils equal and reactive to light. Sclerae anicteric. No conjunctival injection. No oro-pharyngeal erythema.  NECK: Supple. There is no jugular venous distention. No bruits, no lymphadenopathy, no thyromegaly.  HEART: Regular rate and rhythm,. No murmurs, no rubs, no clicks.  LUNGS: Diminished breath sounds ABDOMEN: Soft, flat, nontender, nondistended. Has good bowel sounds. No hepatosplenomegaly appreciated.  EXTREMITIES: No evidence of any cyanosis, clubbing, or peripheral edema.  +2 pedal and radial pulses bilaterally.  NEUROLOGIC: The patient is alert,  awake, and oriented x3 with no focal motor or sensory deficits appreciated bilaterally.  SKIN: Moist and warm with no rashes appreciated.  Psych: Not anxious, depressed LN: No inguinal LN enlargement    Antibiotics   Anti-infectives (From admission, onward)   Start     Dose/Rate Route Frequency Ordered Stop   04/13/18 1800  levofloxacin (LEVAQUIN) IVPB 500 mg     500 mg 100 mL/hr over 60 Minutes Intravenous Every 48 hours 04/12/18 0958     04/11/18 1300  levofloxacin (LEVAQUIN) IVPB 500 mg  Status:  Discontinued  500 mg 100 mL/hr over 60 Minutes Intravenous Daily-1800 04/11/18 1242 04/12/18 0958   04/11/18 1200  vancomycin (VANCOCIN) 500 mg in sodium chloride 0.9 % 100 mL IVPB     500 mg 100 mL/hr over 60 Minutes Intravenous Every T-Th-Sa (Hemodialysis) 04/10/18 1606     04/10/18 1545  vancomycin (VANCOCIN) 1,250 mg in sodium chloride 0.9 % 250 mL IVPB     1,250 mg 166.7 mL/hr over 90 Minutes Intravenous  Once 04/10/18 1543 04/10/18 1915      Medications   Scheduled Meds: . aspirin  81 mg Per Tube Daily  . B-complex with vitamin C  1 tablet Per Tube QHS  . chlorhexidine gluconate (MEDLINE KIT)  15 mL Mouth Rinse BID  . Chlorhexidine Gluconate Cloth  6 each Topical Q0600  . epoetin (EPOGEN/PROCRIT) injection  10,000 Units Intravenous Q T,Th,Sa-HD  . famotidine  20 mg Per Tube Daily  . ipratropium-albuterol  3 mL Nebulization Q6H  . latanoprost  1 drop Both Eyes QHS  . levothyroxine  25 mcg Intravenous Daily  . mouth rinse  15 mL Mouth Rinse 10 times per day  . mirtazapine  7.5 mg Oral QHS  . sodium chloride flush  10-40 mL Intracatheter Q12H   Continuous Infusions: . dexmedetomidine (PRECEDEX) IV infusion Stopped (04/14/18 0756)  . feeding supplement (VITAL AF 1.2 CAL) 45 mL/hr at 04/14/18 0700  . levofloxacin (LEVAQUIN) IV Stopped (04/13/18 1838)  . norepinephrine (LEVOPHED) Adult infusion Stopped (04/14/18 0756)  . vancomycin Stopped (04/13/18 1505)   PRN  Meds:.acetaminophen **OR** acetaminophen, docusate, fentaNYL (SUBLIMAZE) injection, heparin, ondansetron **OR** ondansetron (ZOFRAN) IV, pentafluoroprop-tetrafluoroeth, pentafluoroprop-tetrafluoroeth, sodium chloride flush   Data Review:   Micro Results Recent Results (from the past 240 hour(s))  MRSA PCR Screening     Status: None   Collection Time: 04/10/18  3:02 AM  Result Value Ref Range Status   MRSA by PCR NEGATIVE NEGATIVE Final    Comment:        The GeneXpert MRSA Assay (FDA approved for NASAL specimens only), is one component of a comprehensive MRSA colonization surveillance program. It is not intended to diagnose MRSA infection nor to guide or monitor treatment for MRSA infections. Performed at [facility], [address] [place], [place] [address]   CULTURE, BLOOD (ROUTINE X 2) w Reflex to ID Panel     Status: None (Preliminary result)   Collection Time: 04/10/18 12:29 PM  Result Value Ref Range Status   Specimen Description BLOOD L HAND  Final   Special Requests   Final    BOTTLES DRAWN AEROBIC AND ANAEROBIC Blood Culture adequate volume   Culture   Final    NO GROWTH 4 DAYS Performed at [facility], [address], [place], [place] [address]    Report Status PENDING  Incomplete  Body fluid culture with gram stain     Status: None   Collection Time: 04/10/18 12:40 PM  Result Value Ref Range Status   Specimen Description   Final    PLEURAL Performed at [facility], [address], [place], [place] [address]    Special Requests   Final    PLEURAL Performed at [facility], [address] [place], [place] [address]    Gram Stain   Final    ABUNDANT WBC PRESENT, PREDOMINANTLY MONONUCLEAR NO ORGANISMS SEEN    Culture   Final    NO GROWTH 3 DAYS Performed at [facility], [place],  [place] [address]    Report Status 04/13/2018 FINAL  Final  CULTURE, BLOOD (ROUTINE X 2) w Reflex to ID Panel      Status: None (Preliminary result)   Collection Time: 04/10/18  3:09 PM  Result Value Ref Range Status   Specimen Description BLOOD LT HAND  Final   Special Requests   Final    BOTTLES DRAWN AEROBIC AND ANAEROBIC Blood Culture adequate volume   Culture   Final    NO GROWTH 4 DAYS Performed at [facility], [address], [place], [place] [address]    Report Status PENDING  Incomplete    Radiology Reports Ct Abdomen Pelvis Wo Contrast  Result Date: 03/25/2018 CLINICAL DATA:  Patient refused dialysis today and presents with abdominal pain and tachycardia. EXAM: CT CHEST, ABDOMEN AND PELVIS WITHOUT CONTRAST TECHNIQUE: Multidetector CT imaging of the chest, abdomen and pelvis was performed following the standard protocol without IV contrast. COMPARISON:  CXR 04/13/2018 FINDINGS: CT CHEST FINDINGS Cardiovascular: Cardiomegaly with three-vessel coronary arteriosclerosis. Trace pericardial effusion. Minimal aortic atherosclerosis without aneurysm. Mild dilatation of the main pulmonary artery to 3 cm may reflect a component of chronic pulmonary hypertension. Mediastinum/Nodes: No thyromegaly. Midline patent trachea and mainstem bronchi. No adenopathy. Esophagus is unremarkable. Lungs/Pleura: Moderate left and moderate to large right pleural effusions with adjacent compressive atelectasis. Bronchiectasis to both lower lobes. Subpleural atelectasis in the lingula left lower lobe. Musculoskeletal: Thoracic spondylosis. No aggressive osseous lesions. CT ABDOMEN PELVIS FINDINGS Hepatobiliary: Markedly distended gallbladder with punctate dependent calculus noted. No wall thickening or pericholecystic fluid. The unenhanced liver is unremarkable. Portal vein is difficult to assess given lack of IV contrast there is some heterogeneity seen within. Pancreas: Atrophic without acute abnormality. Spleen: Normal Adrenals/Urinary Tract: Atrophic kidneys. No nephrolithiasis nor hydroureteronephrosis. The urinary  bladder demonstrates tiny layering calculi along the left posterior wall. Stomach/Bowel: Stomach is within normal limits. Appendix appears normal. No evidence of bowel wall thickening, distention, or inflammatory changes. Vascular/Lymphatic: Nonaneurysmal moderate atherosclerosis of the aorta and branch vessels. No lymphadenopathy. Reproductive: Uterus and bilateral adnexa are unremarkable. Other: Diffuse soft tissue anasarca. Phleboliths and/or injection granulomata are identified overlying the buttocks. Musculoskeletal: No acute osseous abnormality. Lumbar spondylosis with degenerative disc disease L4-5 and L5-S1 with multilevel facet arthropathy. IMPRESSION: Chest CT: 1. Cardiomegaly with 3 vessel coronary arteriosclerosis. 2. Bilateral pleural effusions with compressive atelectasis, moderate on the left and moderate to large on the right. 3. Slight dilatation of the main pulmonary artery to 3 cm compatible with chronic pulmonary hypertension. 4. Thoracic spondylosis. CT AP: 1. Marked distention of the gallbladder without pericholecystic fluid or wall thickening. Punctate dependent calculus is identified within. Findings may represent a hydropic gallbladder or a fasting state of the gallbladder counting for the distention. 2. Atrophic kidneys without obstructive uropathy. Findings would be in keeping with the patient's dialysis dependence. 3. Diffuse soft tissue anasarca. 4. Degenerative disc disease L4-5 and L5-S1. Electronically Signed   By: Ashley Royalty M.D.   On: 03/26/2018 23:20   Dg Chest 1 View  Result Date: 04/12/2018 CLINICAL DATA:  77 year old female with shortness of breath. EXAM: CHEST  1 VIEW COMPARISON:  Multiple priors, most recently chest x-ray 04/11/2018. FINDINGS: An endotracheal tube is in place with tip 2.6 cm above the carina. Nasogastric tube extends into the proximal stomach with side port just distal to the gastroesophageal junction. Right subclavian central venous catheter with tip  terminating at the superior cavoatrial junction. Right-sided small bore chest tube in place with tip  in the mid right hemithorax. Small residual right pneumothorax remains (predominantly in the right mid to lower hemithorax), decreased in size compared to yesterday's examination. Lung volumes are low. Elevation of left hemidiaphragm. Small left pleural effusion. Left basilar opacity may reflect associated atelectasis and/or consolidation. No evidence of pulmonary edema. Heart size is mildly enlarged. The patient is rotated to the left on today's exam, resulting in distortion of the mediastinal contours and reduced diagnostic sensitivity and specificity for mediastinal pathology. IMPRESSION: 1. Support apparatus, as above. 2. Decreasing small residual right pneumothorax with right-sided chest tube stable in position, as above. 3. Low lung volumes with atelectasis and/or consolidation in the left lower lobe and small left pleural effusion. Electronically Signed   By: Vinnie Langton M.D.   On: 04/12/2018 08:17   Dg Chest 1 View  Result Date: 04/10/2018 CLINICAL DATA:  Post right-sided thoracentesis. Known right-sided pneumothorax. EXAM: CHEST  1 VIEW COMPARISON:  Chest radiograph-04/24/2018; 02/12/2018; chest CT-04/15/2018 FINDINGS: Grossly unchanged enlarged cardiac silhouette and mediastinal contours. Interval reduction/resolution of right-sided pleural effusion post thoracentesis. Minimally improved aeration of the right lung base with persistent right basilar hydropneumothorax. No mediastinal shift. Minimally improved aeration the right lower lung with persistent heterogeneous/consolidative opacities. Grossly unchanged small left-sided effusion with associated left basilar heterogeneous/consolidative opacities. No new focal airspace opacities. Mild pulmonary venous congestion. No acute osseus abnormalities. IMPRESSION: 1. Interval reduction/resolution of right-sided effusion post thoracentesis with residual  right basilar pneumothorax. 2. Unchanged small left-sided effusion with associated left basilar heterogeneous/consolidative opacities, atelectasis versus infiltrate. 3. Minimally improved aeration of the right lung base. Critical Value/emergent results were called by telephone at the time of interpretation on 04/10/2018 at 11:09 am to Dr. Posey Pronto, who verbally acknowledged these results. Electronically Signed   By: Sandi Mariscal M.D.   On: 04/10/2018 11:08   Dg Abd 1 View  Result Date: 04/10/2018 CLINICAL DATA:  Orogastric tube placement EXAM: ABDOMEN - 1 VIEW COMPARISON:  None. FINDINGS: Orogastric tube tip and side port project over the stomach. Bilateral pleural effusions with associated atelectasis. Prominent small bowel in the central abdomen. IMPRESSION: Orogastric tube tip and side port projecting over the stomach. Electronically Signed   By: Ulyses Jarred M.D.   On: 04/10/2018 14:53   Ct Chest Wo Contrast  Result Date: 04/18/2018 CLINICAL DATA:  Patient refused dialysis today and presents with abdominal pain and tachycardia. EXAM: CT CHEST, ABDOMEN AND PELVIS WITHOUT CONTRAST TECHNIQUE: Multidetector CT imaging of the chest, abdomen and pelvis was performed following the standard protocol without IV contrast. COMPARISON:  CXR 04/10/2018 FINDINGS: CT CHEST FINDINGS Cardiovascular: Cardiomegaly with three-vessel coronary arteriosclerosis. Trace pericardial effusion. Minimal aortic atherosclerosis without aneurysm. Mild dilatation of the main pulmonary artery to 3 cm may reflect a component of chronic pulmonary hypertension. Mediastinum/Nodes: No thyromegaly. Midline patent trachea and mainstem bronchi. No adenopathy. Esophagus is unremarkable. Lungs/Pleura: Moderate left and moderate to large right pleural effusions with adjacent compressive atelectasis. Bronchiectasis to both lower lobes. Subpleural atelectasis in the lingula left lower lobe. Musculoskeletal: Thoracic spondylosis. No aggressive osseous  lesions. CT ABDOMEN PELVIS FINDINGS Hepatobiliary: Markedly distended gallbladder with punctate dependent calculus noted. No wall thickening or pericholecystic fluid. The unenhanced liver is unremarkable. Portal vein is difficult to assess given lack of IV contrast there is some heterogeneity seen within. Pancreas: Atrophic without acute abnormality. Spleen: Normal Adrenals/Urinary Tract: Atrophic kidneys. No nephrolithiasis nor hydroureteronephrosis. The urinary bladder demonstrates tiny layering calculi along the left posterior wall. Stomach/Bowel: Stomach is within normal limits. Appendix appears  normal. No evidence of bowel wall thickening, distention, or inflammatory changes. Vascular/Lymphatic: Nonaneurysmal moderate atherosclerosis of the aorta and branch vessels. No lymphadenopathy. Reproductive: Uterus and bilateral adnexa are unremarkable. Other: Diffuse soft tissue anasarca. Phleboliths and/or injection granulomata are identified overlying the buttocks. Musculoskeletal: No acute osseous abnormality. Lumbar spondylosis with degenerative disc disease L4-5 and L5-S1 with multilevel facet arthropathy. IMPRESSION: Chest CT: 1. Cardiomegaly with 3 vessel coronary arteriosclerosis. 2. Bilateral pleural effusions with compressive atelectasis, moderate on the left and moderate to large on the right. 3. Slight dilatation of the main pulmonary artery to 3 cm compatible with chronic pulmonary hypertension. 4. Thoracic spondylosis. CT AP: 1. Marked distention of the gallbladder without pericholecystic fluid or wall thickening. Punctate dependent calculus is identified within. Findings may represent a hydropic gallbladder or a fasting state of the gallbladder counting for the distention. 2. Atrophic kidneys without obstructive uropathy. Findings would be in keeping with the patient's dialysis dependence. 3. Diffuse soft tissue anasarca. 4. Degenerative disc disease L4-5 and L5-S1. Electronically Signed   By: Ashley Royalty M.D.   On: 03/27/2018 23:20   Dg Chest Port 1 View  Result Date: 04/14/2018 CLINICAL DATA:  Follow-up pneumothorax. EXAM: PORTABLE CHEST 1 VIEW COMPARISON:  Yesterday. FINDINGS: An approximately 15% right basilar pneumothorax has not changed significantly. A right pleural pigtail catheter remains in place. Right subclavian catheter tip in the superior vena cava. Endotracheal tube in satisfactory position. Nasogastric tube tip and side hole in the proximal stomach. The cardiac silhouette remains borderline enlarged. There has been no significant change in left basilar airspace opacity and pleural fluid. Decreased ill-defined opacity at the right lung base. Thoracic spine degenerative changes. IMPRESSION: 1. No significant change in left basilar atelectasis or pneumonia and left pleural fluid. 2. Stable approximately 15% right basilar pneumothorax. 3. Decreased right basilar atelectasis and fluid. Electronically Signed   By: Claudie Revering M.D.   On: 04/14/2018 08:00   Dg Chest Port 1 View  Result Date: 04/13/2018 CLINICAL DATA:  Acute respiratory failure. EXAM: PORTABLE CHEST 1 VIEW COMPARISON:  Chest x-ray from yesterday. FINDINGS: Unchanged endotracheal tube, enteric tube, right subclavian central venous catheter, and right pigtail chest tube. Stable cardiomediastinal silhouette. Normal pulmonary vascularity. Unchanged small residual right pneumothorax in the mid to lower hemithorax. Unchanged volume loss in the right lower lobe. Unchanged small left pleural effusion with left lower lobe airspace disease. No acute osseous abnormality. IMPRESSION: 1. Unchanged small residual right pneumothorax and volume loss in the right lower lobe. 2. Unchanged small left pleural effusion with adjacent left lower lobe atelectasis/infiltrate. 3. Stable support apparatus. Electronically Signed   By: Titus Dubin M.D.   On: 04/13/2018 08:56   Dg Chest Port 1 View  Result Date: 04/11/2018 CLINICAL DATA:  Intubation.  EXAM: PORTABLE CHEST 1 VIEW COMPARISON:  04/10/2018. FINDINGS: Endotracheal tube, NG tube, right subclavian line, right chest tube in stable position. Stable cardiomegaly. Persistent bibasilar atelectasis/infiltrates and bilateral pleural effusions. Persistent right-sided pneumothorax with slight interim improvement. IMPRESSION: 1. Lines and tubes including right chest tube in stable position. Slight improvement of right-sided pneumothorax. 2. Bibasilar atelectasis and bilateral pleural effusions, left side greater than right again noted. No significant change from prior exam. Electronically Signed   By: Marcello Moores  Register   On: 04/11/2018 07:37   Dg Chest Port 1 View  Result Date: 04/10/2018 CLINICAL DATA:  Post intubation and right-sided chest tube placement EXAM: PORTABLE CHEST 1 VIEW COMPARISON:  Earlier same day (multiple examinations) 04/15/2018; chest CT-04/09/2028  FINDINGS: Grossly unchanged cardiac silhouette and mediastinal contours with partial obscuration of the left heart border secondary to unchanged small left-sided effusion associated left basilar heterogeneous/consolidative opacities. No new focal airspace opacities. Endotracheal tube overlies the tracheal air column with tip superior to the carina. Enteric tube tip and side port project below the left hemidiaphragm. Interval placement of right subclavian vein approach central venous catheter with tip projected over the superior cavoatrial junction. Interval placement of a right lateral chest tube with reduction/resolution of right-sided effusion and unchanged small right basilar hydropneumothorax. No mediastinal shift. No acute osseus abnormalities. Degenerative change of the right glenohumeral joint is suspected though incompletely evaluated. IMPRESSION: 1. Interval placement of right-sided chest tube with reduction/resolution of right-sided effusion post thoracentesis in grossly unchanged small right basilar hydropneumothorax. 2. Right  subclavian vein approach central venous catheter tip projects over the superior cavoatrial junction. 3. Stable position of remaining support apparatus. 4. Unchanged small left-sided effusion associated left basilar opacities, atelectasis versus infiltrate. No new focal airspace opacities. Electronically Signed   By: Sandi Mariscal M.D.   On: 04/10/2018 14:47   Portable Chest X-ray  Result Date: 04/10/2018 CLINICAL DATA:  77 year old female with a history of intubation. EXAM: PORTABLE CHEST 1 VIEW COMPARISON:  04/10/2018, 02/12/2018, chest CT 03/28/2018 FINDINGS: Cardiomediastinal silhouette unchanged in size and contour, with the heart borders partially obscured by overlying lung/pleural disease. Dense opacities at the bilateral lung bases, worst on the left. Small right-sided pneumothorax persists. Interval placement of endotracheal tube which terminates approximately 3.2 cm above the carina. Interval placement of gastric tube terminating in the left upper abdomen out of the field of view. IMPRESSION: Interval placement of endotracheal tube, terminating above the carina approximately 3.2 cm. Interval placement of gastric tube. Similar appearance of right hydropneumothorax, unchanged from the prior. Dense opacity at the left base, likely a combination of atelectasis/consolidation, pleural fluid, and/or edema. Electronically Signed   By: Corrie Mckusick D.O.   On: 04/10/2018 13:23   Dg Chest Port 1 View  Result Date: 04/10/2018 CLINICAL DATA:  Post recent right-sided thoracentesis with persistent respiratory distress. EXAM: PORTABLE CHEST 1 VIEW COMPARISON:  Earlier same day; 03/31/2018; chest CT-04/11/2018 FINDINGS: Grossly unchanged enlarged cardiac silhouette and mediastinal contours. Grossly unchanged small loculated right basilar hydropneumothorax post recent thoracentesis. No midline shift. Unchanged small left-sided effusion with associated left basilar opacities. No new focal airspace opacities. Mild  pulmonary venous congestion without frank evidence of edema. No acute osseus abnormalities. No acute osseus abnormalities. IMPRESSION: 1. Grossly unchanged small loculated right basilar hydropneumothorax post recent thoracentesis. 2. Unchanged small left-sided effusion associated left basilar opacities, atelectasis versus infiltrate. 3. Pulmonary venous congestion without frank evidence of edema. Electronically Signed   By: Sandi Mariscal M.D.   On: 04/10/2018 12:25   Dg Chest Portable 1 View  Result Date: 04/23/2018 CLINICAL DATA:  Shortness of breath. Refused dialysis today. History of CHF. EXAM: PORTABLE CHEST 1 VIEW COMPARISON:  Chest radiograph Feb 12, 2018 FINDINGS: Stable cardiomegaly. Pulmonary vascular congestion. Similar moderate to large bilateral pleural effusions. Biapical pleural capping. Prominent pleural reflection mid lung zone. No mediastinal shift. Soft tissue planes and included osseous structures are nonacute. High riding bilateral humeral heads seen with old rotator cuff injuries. IMPRESSION: 1. Moderate to large pleural effusions, possible RIGHT hydropneumothorax without mediastinal shift. Similar underlying consolidation. 2. Stable cardiomegaly and pulmonary vascular congestion. 3. Acute findings discussed with and reconfirmed by Dr.GRAYDON GOODMAN on 04/24/2018 at 9:49 pm. Electronically Signed   By: Elon Alas  M.D.   On: 04/02/2018 21:49   US Abdomen Limited Ruq  Result Date: 04/11/2018 CLINICAL DATA:  Question of gallstones on recent CT EXAM: ULTRASOUND ABDOMEN LIMITED RIGHT UPPER QUADRANT COMPARISON:  CT abdomen and pelvis April 09, 2018 FINDINGS: Gallbladder: Gallbladder appears somewhat distended with irregularities along the gallbladder wall. Sludge is seen in the gallbladder. There are no convincing echogenic foci which move and shadow as is expected with gallstones. There is no pericholecystic fluid. No sonographic Murphy sign noted by sonographer. Common bile duct:  Diameter: 4 mm. No intrahepatic or extrahepatic biliary duct dilatation. Liver: No focal lesion identified. Within normal limits in parenchymal echogenicity. Portal vein is patent on color Doppler imaging with normal direction of blood flow towards the liver. Trace ascites. IMPRESSION: 1. Sludge in gallbladder. No convincing gallstones. Tiny gallstones could be obscured by sludge. The gallbladder is somewhat distended with mild irregularity along the gallbladder wall. Gallbladder wall is not frankly thickened, however. Question underlying inflammation within the gallbladder wall. This combination of findings may warrant correlation with nuclear medicine hepatobiliary imaging study to assess for cystic duct patency. 2.  Slight ascites. 3.  Study otherwise unremarkable. Electronically Signed   By: Lowella Grip III M.D.   On: 04/11/2018 08:59   US Thoracentesis Asp Pleural Space W/img Guide  Result Date: 04/10/2018 INDICATION: Symptomatic right sided pleural effusion. Note, patient has known right basilar hydropneumothorax demonstrated on preceding chest radiograph and chest CT. EXAM: US THORACENTESIS ASP PLEURAL SPACE W/IMG GUIDE COMPARISON:  Chest radiograph-04/17/2018; chest CT-04/05/2018; MEDICATIONS: None. COMPLICATIONS: SIR LEVEL B - Normal therapy, includes overnight admission for observation. Patient experienced a sudden transient decreased oxygen saturation to the level of 40s necessitating calling of a rapid response, however her oxygen saturation levels normalized to the mid 90s with the administration of supplement total oxygen and postprocedural chest radiograph demonstrated resolution of the right-sided effusion with no change in the size of the right basilar pneumothorax and no additional intervention was required. TECHNIQUE: Informed written consent was obtained from the patient after a discussion of the risks, benefits and alternatives to treatment. A timeout was performed prior to the  initiation of the procedure. With the patient positioned left lateral decubitus, initial ultrasound scanning demonstrates a moderate right-sided anechoic pleural effusion. The lower chest was prepped and draped in the usual sterile fashion. 1% lidocaine was used for local anesthesia. An ultrasound image was saved for documentation purposes. An 8 Fr Safe-T-Centesis catheter was introduced. The thoracentesis was performed. The catheter was removed and a dressing was applied. Patient experienced a sudden transient decreased oxygen saturation to the level of 40s necessitating calling of a rapid response, however her oxygen saturation levels normalized to the mid 90s with the administration of supplement total oxygen. The patient had a portable upright chest radiograph which demonstrated resolution of the right-sided effusion with no change in the size of the right basilar pneumothorax and no additional intervention was required. FINDINGS: A total of approximately 400 cc of blood-tinged fluid was removed. IMPRESSION: Successful ultrasound-guided right sided thoracentesis yielding 400 cc of blood tinged pleural fluid. Patient experienced a sudden transient decreased oxygen saturation to the level of 40s necessitating calling of a rapid response, however her oxygen saturation levels normalized to the mid 90s with the administration of supplement total oxygen and postprocedural chest radiograph demonstrated resolution of the right-sided effusion with no change in the size of the right basilar pneumothorax and no additional intervention was required. Electronically Signed   By: Sandi Mariscal  M.D.   On: 04/10/2018 11:20     CBC Recent Labs  Lab 03/29/2018 2125  04/10/18 1229 04/11/18 0445 04/12/18 0405 04/13/18 0608 04/14/18 0424  WBC 7.1   < > 11.6* 10.0 12.0* 12.3* 13.6*  HGB 10.1*   < > 10.2* 9.6* 8.9* 8.3* 8.2*  HCT 31.2*   < > 32.3* 29.4* 27.6* 25.9* 25.4*  PLT 389   < > 413 355 261 236 247  MCV 86.5   < >  88.8 87.3 87.8 86.9 87.2  MCH 28.0   < > 28.1 28.6 28.2 27.8 28.3  MCHC 32.4   < > 31.6* 32.8 32.1 32.0 32.4  RDW 17.6*   < > 17.4* 17.1* 17.7* 17.4* 16.9*  LYMPHSABS 1.3  --   --  0.3* 0.7*  --  0.7*  MONOABS 0.8  --   --  0.7 0.9  --  1.1*  EOSABS 0.1  --   --  0.0 0.0  --  0.0  BASOSABS 0.1  --   --  0.1 0.0  --  0.0   < > = values in this interval not displayed.    Chemistries  Recent Labs  Lab 04/10/18 0001  04/10/18 1229 04/11/18 0445 04/12/18 0405 04/13/18 0608 04/14/18 0424  NA 141   < > 138 142 142 140 137  K 4.8   < > 4.7 4.9 4.5 4.2 3.8  CL 102   < > 102 105 104 100 99  CO2 29   < > 28 27 29 31 30   GLUCOSE 113*   < > 225* 143* 108* 111* 125*  BUN 71*   < > 74* 77* 53* 47* 36*  CREATININE 4.09*   < > 4.12* 4.21* 3.24* 2.59* 1.88*  CALCIUM 8.6*   < > 8.2* 8.5* 8.2* 7.7* 7.6*  MG  --   --  2.3 2.3 1.9 2.2 1.7  AST 30  --  33 31 32 59*  --   ALT 21  --  22 19 20  42  --   ALKPHOS 118  --  120 105 102 120  --   BILITOT 0.7  --  0.7 0.7 0.7 0.6  --    < > = values in this interval not displayed.   ------------------------------------------------------------------------------------------------------------------ estimated creatinine clearance is 19.2 mL/min (A) (by C-G formula based on SCr of 1.88 mg/dL (H)). ------------------------------------------------------------------------------------------------------------------ No results for input(s): HGBA1C in the last 72 hours. ------------------------------------------------------------------------------------------------------------------ No results for input(s): CHOL, HDL, LDLCALC, TRIG, CHOLHDL, LDLDIRECT in the last 72 hours. ------------------------------------------------------------------------------------------------------------------ No results for input(s): TSH, T4TOTAL, T3FREE, THYROIDAB in the last 72 hours.  Invalid input(s):  FREET3 ------------------------------------------------------------------------------------------------------------------ No results for input(s): VITAMINB12, FOLATE, FERRITIN, TIBC, IRON, RETICCTPCT in the last 72 hours.  Coagulation profile Recent Labs  Lab 04/11/18 0445 04/12/18 0405  INR 1.13 1.12    No results for input(s): DDIMER in the last 72 hours.  Cardiac Enzymes Recent Labs  Lab 04/10/18 1229 04/10/18 1827 04/11/18 0046  TROPONINI <0.03 0.07* 0.07*   ------------------------------------------------------------------------------------------------------------------ Invalid input(s): POCBNP    Assessment & Plan  Patient is a 77 year old white female end-stage renal disease  1.  Acute respiratory failure status post thoracentesis Patient had pneumothorax now with chest tube in Continue ventilator  2.  Bilateral pleural effusion exudative nature no growth Status post drainage, empiric antibiotics continue for now  3.  End-stage renal disease hemodialysis per nephrology not be able to do it today  4.  Hypothyroidism continue Synthroid  5.  History of CHF with dialysis per nephrology  6.  CODE STATUS full code prognosis poor        Code Status Orders  (From admission, onward)        Start     Ordered   04/10/18 0253  Full code  Continuous     04/10/18 0252    Code Status History    Date Active Date Inactive Code Status Order ID Comments User Context   02/12/2018 1528 02/13/2018 1931 Full Code [id number]  Dustin Flock, MD ED   01/10/2018 1536 01/13/2018 0045 Full Code [id number]  Loletha Grayer, MD ED   10/19/2017 1310 10/20/2017 2106 Full Code [id number]  Bettey Costa, MD Inpatient   08/17/2017 2122 08/22/2017 2014 DNR [id number]  Loletha Grayer, MD ED   08/14/2017 0459 08/15/2017 1941 Full Code [id number]  Harrie Foreman, MD Inpatient           Consults intensivist, nephrology  DVT Prophylaxis heparin  Lab Results  Component Value  Date   PLT 247 04/14/2018     Time Spent in minutes   32 minutes spent  Greater than 50% of time spent in care coordination and counseling patient regarding the condition and plan of care.   Dustin Flock M.D on 04/14/2018 at 11:59 AM  Between 7am to 6pm - Pager - [phone]  After 6pm go to [email or web address] - Proofreader  [employer]   Office  [phone]

## 2018-04-14 NOTE — Progress Notes (Signed)
CRITICAL CARE NOTE  CC  follow up hypercapnic respiratory failure secondary to pleural effusion and pneumothorax  SUBJECTIVE Patient remains critically ill Prognosis is guarded  77 year old female with ESRD, CHF, and bilateral pleural effusion. Patient continues to be intubated and sedated over night. Patient doing well with SBT this morning. Patient had hemodialysis yesterday and is having another treatment today. Patient responds to my voice and is following commands.     BP (!) 100/57 (BP Location: Left Arm)   Pulse (!) 106   Temp 97.7 F (36.5 C) (Bladder)   Resp (!) 24   Ht 5\' 2"  (1.575 m)   Wt 107 lb 2.3 oz (48.6 kg)   SpO2 100%   BMI 19.60 kg/m    REVIEW OF SYSTEMS  Unavailable as patient is intubated  PHYSICAL EXAMINATION:  GENERAL:comfortable on ventilator HEAD: Normocephalic, atraumatic.  EYES: Pupils equal, round, reactive to light.  No scleral icterus.  MOUTH: Moist mucosal membrane. NECK: Supple. No thyromegaly. No nodules. No JVD. Right chest tube air leak PULMONARY: no rhonchi, no wheezing, chest tube in place CARDIOVASCULAR: S1 and S2. Regular rate and rhythm. No murmurs, rubs, or gallops.  GASTROINTESTINAL: Soft, nontender, not-distended. No masses. Positive bowel sounds. No hepatosplenomegaly.  MUSCULOSKELETAL: No swelling, clubbing, or edema.  NEUROLOGIC: alert, following verbal commands.  SKIN: ulceration on left forearm, skin breakdown on right forearm, hyperpigmented feet  ASSESSMENT AND PLAN SYNOPSIS  77 year old female with hypercarbic respiratory failure secondary to bilateral pleural effusions and pneumothorax. Patient has ESRD and is on hemodialysis. Patient had brief episode of SVT whilst on HD yesterday so SBT was not attempted.   Severe Hypoxic and Hypercapnic Respiratory Failure -full MV support -continue Bronchodilator Therapy -SBT today for possible extubation  Right Pneumothorax -chest tube back on suction as there is a residual  15% pneumothroax  Left pleural effusion/ atelectasis/infiltrates - continue ABX   ESRD -hemodialysis per Nephologist plans -Anemia of chronic kidney disease: hemoglobin trending down 9.6-->8.9 -appreciate nephrology consult   NEUROLOGY - intubated: precedex,  -acute encephalopathy likely secondary to hypercarbic respiratory failure is improving   CARDIAC Cardiomyopathy -ICU monitoring -Echo on 7/17: EF of 30-35% with aortic regurgitation. No major changes from 07/2017 echo. -patient off of pressors: will monitor with dialysis treatment   GI: -RUQ US 7/17: distention of gallbladder with possible underlying inflammation. Recommend HIDA scan. Will pursue further once patient is stable -lipase normal -low albumin, otherwise hepatic function panel normal  ID -abx: levaquin and vancomycin -Cultures:  -pleural fluid: no growth, elevated LDH indicating exudative  etiology  -blood cultures: no growth  Endocrine: -TSH: elevated, synthroid increased yesterday  -T4 is normal -Tube feeds started yesterday: sugar 108 this morning   DVT: heparin subQ GI:  pepcid  Family: will update when available.   Critical Care Time devoted to patient care services described in this note is 37 minutes.   Overall, patient is critically ill, prognosis is guarded.  Patient with Multiorgan failure and at high risk for cardiac arrest and death.   Jackson LatinoKarol Trevaris Pennella, MD

## 2018-04-15 ENCOUNTER — Inpatient Hospital Stay: Payer: Medicare Other

## 2018-04-15 DIAGNOSIS — I5043 Acute on chronic combined systolic (congestive) and diastolic (congestive) heart failure: Secondary | ICD-10-CM

## 2018-04-15 LAB — CBC WITH DIFFERENTIAL/PLATELET
BASOS PCT: 0 %
Basophils Absolute: 0 10*3/uL (ref 0–0.1)
EOS PCT: 1 %
Eosinophils Absolute: 0.1 10*3/uL (ref 0–0.7)
HCT: 25.3 % — ABNORMAL LOW (ref 35.0–47.0)
Hemoglobin: 8.1 g/dL — ABNORMAL LOW (ref 12.0–16.0)
LYMPHS ABS: 0.9 10*3/uL — AB (ref 1.0–3.6)
Lymphocytes Relative: 7 %
MCH: 27.9 pg (ref 26.0–34.0)
MCHC: 31.9 g/dL — AB (ref 32.0–36.0)
MCV: 87.4 fL (ref 80.0–100.0)
MONOS PCT: 13 %
Monocytes Absolute: 1.6 10*3/uL — ABNORMAL HIGH (ref 0.2–0.9)
NEUTROS PCT: 79 %
Neutro Abs: 10.2 10*3/uL — ABNORMAL HIGH (ref 1.4–6.5)
Platelets: 315 10*3/uL (ref 150–440)
RBC: 2.89 MIL/uL — ABNORMAL LOW (ref 3.80–5.20)
RDW: 16.8 % — AB (ref 11.5–14.5)
WBC: 12.9 10*3/uL — ABNORMAL HIGH (ref 3.6–11.0)

## 2018-04-15 LAB — RENAL FUNCTION PANEL
Albumin: 2.5 g/dL — ABNORMAL LOW (ref 3.5–5.0)
Anion gap: 11 (ref 5–15)
BUN: 55 mg/dL — AB (ref 8–23)
CALCIUM: 7.9 mg/dL — AB (ref 8.9–10.3)
CO2: 30 mmol/L (ref 22–32)
CREATININE: 2.6 mg/dL — AB (ref 0.44–1.00)
Chloride: 97 mmol/L — ABNORMAL LOW (ref 98–111)
GFR calc non Af Amer: 17 mL/min — ABNORMAL LOW (ref >60)
GFR, EST AFRICAN AMERICAN: 19 mL/min — AB (ref >60)
Glucose, Bld: 117 mg/dL — ABNORMAL HIGH (ref 70–99)
Phosphorus: 4 mg/dL (ref 2.5–4.6)
Potassium: 4.4 mmol/L (ref 3.5–5.1)
SODIUM: 138 mmol/L (ref 135–145)

## 2018-04-15 LAB — CULTURE, BLOOD (ROUTINE X 2)
Culture: NO GROWTH
Culture: NO GROWTH
Special Requests: ADEQUATE
Special Requests: ADEQUATE

## 2018-04-15 LAB — GLUCOSE, CAPILLARY
GLUCOSE-CAPILLARY: 106 mg/dL — AB (ref 70–99)
GLUCOSE-CAPILLARY: 101 mg/dL — AB (ref 70–99)
GLUCOSE-CAPILLARY: 89 mg/dL (ref 70–99)
Glucose-Capillary: 89 mg/dL (ref 70–99)
Glucose-Capillary: 92 mg/dL (ref 70–99)
Glucose-Capillary: 94 mg/dL (ref 70–99)

## 2018-04-15 MED ORDER — MIDODRINE HCL 5 MG PO TABS
5.0000 mg | ORAL_TABLET | Freq: Three times a day (TID) | ORAL | Status: DC
  Administered 2018-04-15 – 2018-04-16 (×5): 5 mg
  Filled 2018-04-15 (×8): qty 1

## 2018-04-15 MED ORDER — NOREPINEPHRINE 4 MG/250ML-% IV SOLN
0.0000 ug/min | INTRAVENOUS | Status: DC
  Administered 2018-04-15: 2 ug/min via INTRAVENOUS
  Administered 2018-04-16: 15 ug/min via INTRAVENOUS
  Administered 2018-04-16: 6 ug/min via INTRAVENOUS
  Administered 2018-04-16: 10 ug/min via INTRAVENOUS
  Filled 2018-04-15: qty 250

## 2018-04-15 MED ORDER — IPRATROPIUM-ALBUTEROL 0.5-2.5 (3) MG/3ML IN SOLN
3.0000 mL | Freq: Four times a day (QID) | RESPIRATORY_TRACT | Status: DC | PRN
  Administered 2018-04-16: 3 mL via RESPIRATORY_TRACT
  Filled 2018-04-15: qty 3

## 2018-04-15 MED ORDER — METOPROLOL TARTRATE 25 MG PO TABS
25.0000 mg | ORAL_TABLET | Freq: Two times a day (BID) | ORAL | Status: DC
  Administered 2018-04-15 – 2018-04-18 (×4): 25 mg
  Filled 2018-04-15 (×4): qty 1

## 2018-04-15 MED ORDER — CHLORHEXIDINE GLUCONATE 0.12 % MT SOLN
OROMUCOSAL | Status: AC
  Administered 2018-04-15: 30 mL
  Filled 2018-04-15: qty 15

## 2018-04-15 MED ORDER — METOPROLOL TARTRATE 5 MG/5ML IV SOLN
2.5000 mg | INTRAVENOUS | Status: DC | PRN
  Administered 2018-04-15 – 2018-04-16 (×4): 5 mg via INTRAVENOUS
  Filled 2018-04-15 (×4): qty 5

## 2018-04-15 NOTE — Progress Notes (Signed)
Intubated, sedated on fentanyl infusion. RASS -1. + F/C.  Failed SBT.  Presently on PSV 20 cm H2O with adequate VT and RR  Vitals:   04/15/18 1000 04/15/18 1100 04/15/18 1127 04/15/18 1200  BP: (!) 96/56 (!) 85/57  (!) 98/57  Pulse: (!) 110     Resp: (!) 25 15  17   Temp:      TempSrc:      SpO2: 100%  100%   Weight:      Height:       Vent Mode: PSV FiO2 (%):  [30 %] 30 % Set Rate:  [16 bmp] 16 bmp Vt Set:  [400 mL] 400 mL PEEP:  [5 cmH20] 5 cmH20 Pressure Support:  [20 cmH20] 20 cmH20 Plateau Pressure:  [22 cmH20-24 cmH20] 22 cmH20  Very frail and cachectic HEENT WNL No JVD Prolonged expiratory phase, no wheezes or other adventitious sounds Tachy, regular, no M Abdomen soft, NABS Extremities warm, no edema No focal neurologic deficits  BMP Latest Ref Rng & Units 04/15/2018 04/14/2018 04/13/2018  Glucose 70 - 99 mg/dL 161(W117(H) 960(A125(H) 540(J111(H)  BUN 8 - 23 mg/dL 81(X55(H) 91(Y36(H) 78(G47(H)  Creatinine 0.44 - 1.00 mg/dL 9.56(O2.60(H) 1.30(Q1.88(H) 6.57(Q2.59(H)  Sodium 135 - 145 mmol/L 138 137 140  Potassium 3.5 - 5.1 mmol/L 4.4 3.8 4.2  Chloride 98 - 111 mmol/L 97(L) 99 100  CO2 22 - 32 mmol/L 30 30 31   Calcium 8.9 - 10.3 mg/dL 7.9(L) 7.6(L) 7.7(L)   CBC Latest Ref Rng & Units 04/15/2018 04/14/2018 04/13/2018  WBC 3.6 - 11.0 K/uL 12.9(H) 13.6(H) 12.3(H)  Hemoglobin 12.0 - 16.0 g/dL 8.1(L) 8.2(L) 8.3(L)  Hematocrit 35.0 - 47.0 % 25.3(L) 25.4(L) 25.9(L)  Platelets 150 - 440 K/uL 315 247 236   Chest tube: no air leak  CXR: Persistent right basilar pneumothorax. LLL Atx/effusion  IMPRESSION: Acute on chronic respiratory failure with hypoxemia Chronic bilateral pleural effusions Right pneumothorax after thoracentesis.  No air leak.  Might be an ex vacuo PTX Dilated cardiomyopathy Sinus tachycardia ESRD Adult failure to thrive Severe protein-calorie malnutrition/cachexia I see no compelling evidence of an infectious process  PLAN/REC: Cont vent support - settings reviewed and/or adjusted PSV mode as  tolerated Cont vent bundle Daily SBT if/when meets criteria Initiate scheduled metoprolol IV metoprolol as needed to maintain HR <115/min HD per nephrology service Discontinue antibiotics Continue nutritional support Goals of care conference planned for 7/23  DVT px: SCDs SUP: enteral famotidine   Billy Fischeravid Simonds, MD PCCM service Mobile [phone] Pager [phone] 04/15/2018 1:13 PM

## 2018-04-15 NOTE — Progress Notes (Signed)
[employer] - [place] [facility]   PATIENT NAME: Ann Cunningham    MR#:  [id number]  DATE OF BIRTH:  12/14/1940  SUBJECTIVE:  CHIEF COMPLAINT:   Chief Complaint  Patient presents with  . Shortness of Breath  Case discussed with intensivist, prognosis remains extremely poor, plans for palliative discussion with family later today, continue to wean vent as tolerated, case discussed with nephrology-possible hemodialysis on tomorrow  REVIEW OF SYSTEMS:  CONSTITUTIONAL: No fever, fatigue or weakness.  EYES: No blurred or double vision.  EARS, NOSE, AND THROAT: No tinnitus or ear pain.  RESPIRATORY: No cough, shortness of breath, wheezing or hemoptysis.  CARDIOVASCULAR: No chest pain, orthopnea, edema.  GASTROINTESTINAL: No nausea, vomiting, diarrhea or abdominal pain.  GENITOURINARY: No dysuria, hematuria.  ENDOCRINE: No polyuria, nocturia,  HEMATOLOGY: No anemia, easy bruising or bleeding SKIN: No rash or lesion. MUSCULOSKELETAL: No joint pain or arthritis.   NEUROLOGIC: No tingling, numbness, weakness.  PSYCHIATRY: No anxiety or depression.   ROS  DRUG ALLERGIES:   Allergies  Allergen Reactions  . Penicillins Other (See Comments)    Has patient had a PCN reaction causing immediate rash, facial/tongue/throat swelling, SOB or lightheadedness with hypotension: Unknown Has patient had a PCN reaction causing severe rash involving mucus membranes or skin necrosis: Unknown Has patient had a PCN reaction that required hospitalization: Unknown Has patient had a PCN reaction occurring within the last 10 years: Unknown If all of the above answers are "NO", then may proceed with Cephalosporin use.     VITALS:  Blood pressure (!) 98/57, pulse (!) 110, temperature 99.5 F (37.5 C), resp. rate 17, height 5\' 2"  (1.575 m), weight 50.5 kg (111 lb 5.3 oz), SpO2 100 %.  PHYSICAL EXAMINATION:  GENERAL:  77 y.o.-year-old patient lying in the bed with no acute distress.  EYES:  Pupils equal, round, reactive to light and accommodation. No scleral icterus. Extraocular muscles intact.  HEENT: Head atraumatic, normocephalic. Oropharynx and nasopharynx clear.  NECK:  Supple, no jugular venous distention. No thyroid enlargement, no tenderness.  LUNGS: Normal breath sounds bilaterally, no wheezing, rales,rhonchi or crepitation. No use of accessory muscles of respiration.  CARDIOVASCULAR: S1, S2 normal. No murmurs, rubs, or gallops.  ABDOMEN: Soft, nontender, nondistended. Bowel sounds present. No organomegaly or mass.  EXTREMITIES: No pedal edema, cyanosis, or clubbing.  NEUROLOGIC: Cranial nerves II through XII are intact. Muscle strength 5/5 in all extremities. Sensation intact. Gait not checked.  PSYCHIATRIC: The patient is alert and oriented x 3.  SKIN: No obvious rash, lesion, or ulcer.   Physical Exam LABORATORY PANEL:   CBC Recent Labs  Lab 04/15/18 0606  WBC 12.9*  HGB 8.1*  HCT 25.3*  PLT 315   ------------------------------------------------------------------------------------------------------------------  Chemistries  Recent Labs  Lab 04/13/18 0608 04/14/18 0424 04/15/18 0606  NA 140 137 138  K 4.2 3.8 4.4  CL 100 99 97*  CO2 31 30 30   GLUCOSE 111* 125* 117*  BUN 47* 36* 55*  CREATININE 2.59* 1.88* 2.60*  CALCIUM 7.7* 7.6* 7.9*  MG 2.2 1.7  --   AST 59*  --   --   ALT 42  --   --   ALKPHOS 120  --   --   BILITOT 0.6  --   --    ------------------------------------------------------------------------------------------------------------------  Cardiac Enzymes Recent Labs  Lab 04/10/18 1827 04/11/18 0046  TROPONINI 0.07* 0.07*   ------------------------------------------------------------------------------------------------------------------  RADIOLOGY:  Dg Chest Port 1 View  Result Date: 04/15/2018 CLINICAL DATA:  Follow-up pneumothorax EXAM: PORTABLE CHEST 1 VIEW COMPARISON:  04/14/2018 FINDINGS: Cardiac shadow is stable.  Endotracheal tube and nasogastric catheter are again noted in satisfactory position. Right-sided pigtail catheter is again noted with mild residual right pneumothorax laterally and inferiorly stable from the previous exam. Right jugular line is again noted and stable. Left basilar atelectasis has improved slightly in the interval from the prior exam. No new focal abnormality is noted. IMPRESSION: Stable right basilar pneumothorax with chest tube in place. Slight improvement in the degree of left basilar atelectasis. Electronically Signed   By: Alcide CleverMark  Lukens M.D.   On: 04/15/2018 07:10   Dg Chest Port 1 View  Result Date: 04/14/2018 CLINICAL DATA:  Follow-up pneumothorax. EXAM: PORTABLE CHEST 1 VIEW COMPARISON:  Yesterday. FINDINGS: An approximately 15% right basilar pneumothorax has not changed significantly. A right pleural pigtail catheter remains in place. Right subclavian catheter tip in the superior vena cava. Endotracheal tube in satisfactory position. Nasogastric tube tip and side hole in the proximal stomach. The cardiac silhouette remains borderline enlarged. There has been no significant change in left basilar airspace opacity and pleural fluid. Decreased ill-defined opacity at the right lung base. Thoracic spine degenerative changes. IMPRESSION: 1. No significant change in left basilar atelectasis or pneumonia and left pleural fluid. 2. Stable approximately 15% right basilar pneumothorax. 3. Decreased right basilar atelectasis and fluid. Electronically Signed   By: Beckie SaltsSteven  Reid M.D.   On: 04/14/2018 08:00    ASSESSMENT AND PLAN:  Patient is a 77 year old white female end-stage renal disease  1.  Acute respiratory failure status post thoracentesis Stable Patient had pneumothorax now with chest tube in place Continue ventilator weaning as tolerated Case discussed with intensivist, given poor prognosis going forward, plans for palliative care discussion/meeting with family later today to  discuss possible one-way extubation  2.  Bilateral exudative pleural effusion  Stable  Status post drainage, empiric antibiotics   3.  End-stage renal disease  HD Per nephrology on tomorrow   4.  Hypothyroidism continue Synthroid  5.  History of CHF Stable For HD on tomorrow  Very poor prognosis-for palliative discussion later today with family  All the records are reviewed and case discussed with Care Management/Social Workerr. Management plans discussed with the patient, family and they are in agreement.  CODE STATUS: dnr  TOTAL TIME TAKING CARE OF THIS PATIENT: 35 minutes.     POSSIBLE D/C IN 3-5 DAYS, DEPENDING ON CLINICAL CONDITION.   Evelena AsaMontell D Cumi Sanagustin M.D on 04/15/2018   Between 7am to 6pm - Pager - [phone]  After 6pm go to [email or web address] - Social research officer, governmentpassword EPAS [facility]  [facility]  Office  [phone]  CC: Primary care physician; [facility]  Note: This dictation was prepared with Dragon dictation along with smaller phrase technology. Any transcriptional errors that result from this process are unintentional.

## 2018-04-15 NOTE — Progress Notes (Signed)
Uneventful day. [place]ieces checked on patient. No visitors today. Remained on spontanious on the ventilator 8 hours but required 20 of pressure support all that time. Returned to a set respiratory rate on the ventilator at 1745 when her oxygen sats dropped to 88%. Alert and followed commands.Chest tube remains on water seal since 0830 this morning. Chest tube output now serous.  Family meeting tomorrow when 1 niece returns from vacation. No set time as of now. Given Metoprolol 5 mgs x 2 for heart rate greater than 115.

## 2018-04-15 NOTE — Progress Notes (Signed)
CRITICAL CARE NOTE  CC  follow up hypercapnic respiratory failure secondary to bilateral pleural effusion and pneumothorax  SUBJECTIVE Patient remains critically ill Prognosis is guarded  77 year old female with ESRD, CHF, and bilateral pleural effusion. Patient continues to be intubated and sedated over the weekend. SBT was not attempted Saturday as patient had reported SVT during hemodialysis. SBT failed yesterday. Patient did not receive hemodialysis yesterday. Patient responds to voice and follow commands.   SIGNIFICANT EVENTS  Patient complaining of throat pain 7/21, sedation switch from precedex to fentanyl. Levophed was restarted for MAP support.   Chest tube placed on water seal on 7/19. Patient back on suction 7/21 as pneumothorax was >15%  BP (!) 111/54   Pulse (!) 122   Temp 99.5 F (37.5 C)   Resp (!) 21   Ht '5\' 2"'$  (1.575 m)   Wt 50.5 kg (111 lb 5.3 oz)   SpO2 100%   BMI 20.36 kg/m    REVIEW OF SYSTEMS  PATIENT IS UNABLE TO PROVIDE COMPLETE REVIEW OF SYSTEM S DUE TO INTUBATION  PHYSICAL EXAMINATION:  GENERAL:critically ill appearing, +resp distress HEAD: Normocephalic, atraumatic.  EYES: Pupils equal, round, reactive to light.  No scleral icterus.  MOUTH: Moist mucosal membrane. NECK: Supple. No thyromegaly. No nodules. No JVD.  PULMONARY: +rhonchi, +wheezing CARDIOVASCULAR: S1 and S2. Tachycardic and regular rhythm. No murmurs, rubs, or gallops.  GASTROINTESTINAL: Soft, nontender, -distended. No masses. Positive bowel sounds. No hepatosplenomegaly.  MUSCULOSKELETAL: No swelling, clubbing, or edema.  NEUROLOGIC: following verbal commands, purposeful movement. SKIN: left arm ulceration and right arm with skin breakdown. Hyperpigmentation of anterior feet.  ASSESSMENT AND PLAN SYNOPSIS  77 year old female with hypercapnic respiratory failure secondary to bilateral pleural effusions and right sided pneumothorax. Patient receiving HD for ESRD. Unable to wean  off vent over the weekend.  Patient is status DO NOT RESUSCITATE. Further meeting with family and palliative care scheduled for Tuesday.   Severe Hypoxic and Hypercapnic Respiratory Failure -continue Full MV support; this morning, patient on PRVC but is tachycardic -continue Bronchodilator Therapy -Wean Fio2 and PEEP as tolerated -will perform SAT/SBt when respiratory parameters are met  -SBT failed yesterday however patient tolerated PSV for 4  hours -right sided pneumothorax: stable, without improvement since 7/21. Pigtail catheter is in place.  -exudative pleural effusion: right side thoracentesis 7/17: fluid shows lymphocyte predominate with elevated LDH. Cytology report shows no malignancy.   ESRD -appreciate continued nephrology consultation -hemodialysis treatment per nephrology -Creatinine 1.8 (7/21)--> 2.6 (7/22): likely due to no dialysis treatment yesterday -next hemodialysis scheduled for Tuesday 7/23 per Dr. Keturah Barre note   NEUROLOGY - intubated and sedated with fentanyl - minimal sedation to achieve a RASS goal: -1 -patient following verbal commands.   CARDIAC -ICU monitoring -levophed restarted yesterday for pressure support. D/c this morning -echo on 7/17 with no major changes from 07/2017. EF 30-35%  -start scheduled and PRN metoprolol for tachycardia with goal HR <115  GI: -RUQ [place] 7/17: distention of gallbladder with possible underlying inflammation. Need HIDA scan for further evaluation. -lipase and liver function normal. Albumin low   ID -D/C IV abx as prescribed: vancomycin and levaquin -follow up cultures:  -blood culture: negative  -pleural fluid: negative, no growth x 3 days.    -cytology: lymphocytes with neutrophils   DVT: heparin subQ GI: pepcid  TRANSFUSIONS AS NEEDED MONITOR FSBS ASSESS the need for LABS as needed   Critical Care Time devoted to patient care services described in this note  is 31 minutes.   Overall, patient is  critically ill, prognosis is guarded.  Patient with Multiorgan failure and at high risk for cardiac arrest and death.

## 2018-04-15 NOTE — Progress Notes (Signed)
[facility], [place] 04/15/18  Subjective:   Patient opens eyes to voice Remains on ventilator support, fentanyl Tachycardic NG tube feeds 45 cc/hr   Objective:  Vital signs in last 24 hours:  Temp:  [97.9 F (36.6 C)-100 F (37.8 C)] 99.5 F (37.5 C) (07/22 0800) Pulse Rate:  [85-138] 131 (07/22 0800) Resp:  [16-27] 26 (07/22 0800) BP: (77-130)/(47-71) 119/61 (07/22 0800) SpO2:  [98 %-100 %] 98 % (07/22 0822) FiO2 (%):  [30 %] 30 % (07/22 0822) Weight:  [50.5 kg (111 lb 5.3 oz)] 50.5 kg (111 lb 5.3 oz) (07/22 0500)  Weight change: -1.6 kg (-3 lb 8.4 oz) Filed Weights   04/13/18 1500 04/14/18 0430 04/15/18 0500  Weight: 51.7 kg (113 lb 15.7 oz) 48.6 kg (107 lb 2.3 oz) 50.5 kg (111 lb 5.3 oz)    Intake/Output:    Intake/Output Summary (Last 24 hours) at 04/15/2018 0845 Last data filed at 04/15/2018 0600 Gross per 24 hour  Intake 1701.03 ml  Output 60 ml  Net 1641.03 ml     Physical Exam: General: Critically ill appearing  HEENT ETT in place  Pulm/lungs Vent assisted, coarse breath sounds b/l, rt chest tube  CVS/Heart Tachycardic, irregular  Abdomen:  soft  Extremities: Trace edema  Neurologic: Opens eyes to voice commands  Skin: warm  Access: Rt forearm AVF       Basic Metabolic Panel:  Recent Labs  Lab 04/10/18 1229 04/11/18 0445 04/12/18 0405 04/13/18 0608 04/14/18 0424 04/15/18 0606  NA 138 142 142 140 137 138  K 4.7 4.9 4.5 4.2 3.8 4.4  CL 102 105 104 100 99 97*  CO2 28 27 29 31 30 30   GLUCOSE 225* 143* 108* 111* 125* 117*  BUN 74* 77* 53* 47* 36* 55*  CREATININE 4.12* 4.21* 3.24* 2.59* 1.88* 2.60*  CALCIUM 8.2* 8.5* 8.2* 7.7* 7.6* 7.9*  MG 2.3 2.3 1.9 2.2 1.7  --   PHOS  --  5.4* 4.1 2.6 1.7*  1.7* 4.0     CBC: Recent Labs  Lab 04/12/2018 2125  04/11/18 0445 04/12/18 0405 04/13/18 0608 04/14/18 0424 04/15/18 0606  WBC 7.1   < > 10.0 12.0* 12.3* 13.6* 12.9*  NEUTROABS 4.8  --  8.9* 10.4*  --  11.8*  10.2*  HGB 10.1*   < > 9.6* 8.9* 8.3* 8.2* 8.1*  HCT 31.2*   < > 29.4* 27.6* 25.9* 25.4* 25.3*  MCV 86.5   < > 87.3 87.8 86.9 87.2 87.4  PLT 389   < > 355 261 236 247 315   < > = values in this interval not displayed.      Lab Results  Component Value Date   HEPBSAG Negative 08/15/2017   HEPBSAB Non Reactive 08/15/2017   HEPBIGM Negative 08/15/2017      Microbiology:  Recent Results (from the past 240 hour(s))  MRSA PCR Screening     Status: None   Collection Time: 04/10/18  3:02 AM  Result Value Ref Range Status   MRSA by PCR NEGATIVE NEGATIVE Final    Comment:        The GeneXpert MRSA Assay (FDA approved for NASAL specimens only), is one component of a comprehensive MRSA colonization surveillance program. It is not intended to diagnose MRSA infection nor to guide or monitor treatment for MRSA infections. Performed at [facility], [place], [place] [address]   CULTURE, BLOOD (ROUTINE X 2) w Reflex to ID Panel  Status: None   Collection Time: 04/10/18 12:29 PM  Result Value Ref Range Status   Specimen Description BLOOD L HAND  Final   Special Requests   Final    BOTTLES DRAWN AEROBIC AND ANAEROBIC Blood Culture adequate volume   Culture   Final    NO GROWTH 5 DAYS Performed at [facility], [address], [place], [place] [address]    Report Status 04/15/2018 FINAL  Final  Body fluid culture with gram stain     Status: None   Collection Time: 04/10/18 12:40 PM  Result Value Ref Range Status   Specimen Description   Final    PLEURAL Performed at [facility], [address] [place], [place] [address]    Special Requests   Final    PLEURAL Performed at [facility], [address] [place], [place] [address]    Gram Stain   Final    ABUNDANT WBC PRESENT, PREDOMINANTLY MONONUCLEAR NO ORGANISMS SEEN    Culture   Final    NO GROWTH 3 DAYS Performed at [facility], [place] [address],  [place], [place] [address]    Report Status 04/13/2018 FINAL  Final  CULTURE, BLOOD (ROUTINE X 2) w Reflex to ID Panel     Status: None   Collection Time: 04/10/18  3:09 PM  Result Value Ref Range Status   Specimen Description BLOOD LT HAND  Final   Special Requests   Final    BOTTLES DRAWN AEROBIC AND ANAEROBIC Blood Culture adequate volume   Culture   Final    NO GROWTH 5 DAYS Performed at [facility], [address], [place], [place] [address]    Report Status 04/15/2018 FINAL  Final    Coagulation Studies: No results for input(s): LABPROT, INR in the last 72 hours.  Urinalysis: No results for input(s): COLORURINE, LABSPEC, PHURINE, GLUCOSEU, HGBUR, BILIRUBINUR, KETONESUR, PROTEINUR, UROBILINOGEN, NITRITE, LEUKOCYTESUR in the last 72 hours.  Invalid input(s): APPERANCEUR    Imaging: Dg Chest Port 1 View  Result Date: 04/15/2018 CLINICAL DATA:  Follow-up pneumothorax EXAM: PORTABLE CHEST 1 VIEW COMPARISON:  04/14/2018 FINDINGS: Cardiac shadow is stable. Endotracheal tube and nasogastric catheter are again noted in satisfactory position. Right-sided pigtail catheter is again noted with mild residual right pneumothorax laterally and inferiorly stable from the previous exam. Right jugular line is again noted and stable. Left basilar atelectasis has improved slightly in the interval from the prior exam. No new focal abnormality is noted. IMPRESSION: Stable right basilar pneumothorax with chest tube in place. Slight improvement in the degree of left basilar atelectasis. Electronically Signed   By: Inez Catalina M.D.   On: 04/15/2018 07:10   Dg Chest Port 1 View  Result Date: 04/14/2018 CLINICAL DATA:  Follow-up pneumothorax. EXAM: PORTABLE CHEST 1 VIEW COMPARISON:  Yesterday. FINDINGS: An approximately 15% right basilar pneumothorax has not changed significantly. A right pleural pigtail catheter remains in place. Right subclavian catheter tip in the superior vena cava. Endotracheal  tube in satisfactory position. Nasogastric tube tip and side hole in the proximal stomach. The cardiac silhouette remains borderline enlarged. There has been no significant change in left basilar airspace opacity and pleural fluid. Decreased ill-defined opacity at the right lung base. Thoracic spine degenerative changes. IMPRESSION: 1. No significant change in left basilar atelectasis or pneumonia and left pleural fluid. 2. Stable approximately 15% right basilar pneumothorax. 3. Decreased right basilar atelectasis and fluid. Electronically Signed   By: Percell Locus.D.  On: 04/14/2018 08:00     Medications:   . feeding supplement (VITAL AF 1.2 CAL) 45 mL/hr at 04/15/18 0600  . fentaNYL infusion INTRAVENOUS 200 mcg/hr (04/15/18 0826)  . levofloxacin (LEVAQUIN) IV Stopped (04/13/18 1838)  . norepinephrine (LEVOPHED) Adult infusion 2 mcg/min (04/15/18 0827)  . vancomycin Stopped (04/13/18 1505)   . aspirin  81 mg Per Tube Daily  . B-complex with vitamin C  1 tablet Per Tube QHS  . chlorhexidine gluconate (MEDLINE KIT)  15 mL Mouth Rinse BID  . Chlorhexidine Gluconate Cloth  6 each Topical Q0600  . epoetin (EPOGEN/PROCRIT) injection  10,000 Units Intravenous Q T,Th,Sa-HD  . famotidine  20 mg Per Tube Daily  . ipratropium-albuterol  3 mL Nebulization Q6H  . latanoprost  1 drop Both Eyes QHS  . levothyroxine  25 mcg Intravenous Daily  . mouth rinse  15 mL Mouth Rinse 10 times per day  . midodrine  5 mg Per NG tube TID WC  . mirtazapine  7.5 mg Oral QHS  . sodium chloride flush  10-40 mL Intracatheter Q12H   acetaminophen **OR** acetaminophen, docusate, heparin, ondansetron **OR** ondansetron (ZOFRAN) IV, pentafluoroprop-tetrafluoroeth, pentafluoroprop-tetrafluoroeth, sodium chloride flush  Assessment/ Plan:  77 y.o.African [place] female with end stage renal disease on hemodialysis TTS, hypertension, tobacco abuse, glaucoma with blindness, hypothyroidism, congestive heart failure who is  admitted to [facility] on 04/10/2018  Patient with large volume right sided thoracentesis on 7/17 by radiology, however patient developed a pneumothorax and required mechanical ventilation and intubation. Chest tube placed  [facility] TTS [facility] EDW 51.5kg Right AVF  1. ESRD - TTS schedule, 17 G needles, BFR 300 - next HD scheduled for Tuesday  2. Chronic hypotension - midodrine as outpatient  3. Secondary HPTH - Phos 4.0 (7/22)  4. Anemia of CKD - EPO with HD  5. Acute respiratory failure - Rt Pneumothorax with Rt  Chest tube - vent assisted   LOS: [place] 7/22/20198:45 AM  [facility] [place], [place]  Note: This note was prepared with Dragon dictation. Any transcription errors are unintentional

## 2018-04-15 NOTE — Progress Notes (Signed)
[id number] Dr. Sung AmabileSimonds in. Reported patients high heart rate.[id number] Treated with prn Metoprolol 2.5 mgs IVP for heart rate sustained at 160. And also given 2.5 mgs for heart rate in the 130s

## 2018-04-16 ENCOUNTER — Inpatient Hospital Stay: Payer: Medicare Other

## 2018-04-16 DIAGNOSIS — Z7189 Other specified counseling: Secondary | ICD-10-CM

## 2018-04-16 LAB — BASIC METABOLIC PANEL
Anion gap: 8 (ref 5–15)
BUN: 69 mg/dL — ABNORMAL HIGH (ref 8–23)
CALCIUM: 8.2 mg/dL — AB (ref 8.9–10.3)
CO2: 31 mmol/L (ref 22–32)
CREATININE: 3.04 mg/dL — AB (ref 0.44–1.00)
Chloride: 97 mmol/L — ABNORMAL LOW (ref 98–111)
GFR, EST AFRICAN AMERICAN: 16 mL/min — AB (ref >60)
GFR, EST NON AFRICAN AMERICAN: 14 mL/min — AB (ref >60)
Glucose, Bld: 103 mg/dL — ABNORMAL HIGH (ref 70–99)
Potassium: 4.8 mmol/L (ref 3.5–5.1)
SODIUM: 136 mmol/L (ref 135–145)

## 2018-04-16 LAB — CBC
HCT: 25 % — ABNORMAL LOW (ref 35.0–47.0)
Hemoglobin: 8 g/dL — ABNORMAL LOW (ref 12.0–16.0)
MCH: 27.9 pg (ref 26.0–34.0)
MCHC: 31.9 g/dL — ABNORMAL LOW (ref 32.0–36.0)
MCV: 87.4 fL (ref 80.0–100.0)
PLATELETS: 341 10*3/uL (ref 150–440)
RBC: 2.86 MIL/uL — AB (ref 3.80–5.20)
RDW: 17 % — ABNORMAL HIGH (ref 11.5–14.5)
WBC: 18 10*3/uL — AB (ref 3.6–11.0)

## 2018-04-16 LAB — GLUCOSE, CAPILLARY
GLUCOSE-CAPILLARY: 73 mg/dL (ref 70–99)
GLUCOSE-CAPILLARY: 81 mg/dL (ref 70–99)
Glucose-Capillary: 94 mg/dL (ref 70–99)
Glucose-Capillary: 88 mg/dL (ref 70–99)
Glucose-Capillary: 103 mg/dL — ABNORMAL HIGH (ref 70–99)

## 2018-04-16 LAB — BRAIN NATRIURETIC PEPTIDE: B NATRIURETIC PEPTIDE 5: 775 pg/mL — AB (ref 0.0–100.0)

## 2018-04-16 MED ORDER — BRIMONIDINE TARTRATE 0.2 % OP SOLN
1.0000 [drp] | Freq: Three times a day (TID) | OPHTHALMIC | Status: DC
Start: 2018-04-16 — End: 2018-04-18
  Administered 2018-04-16 – 2018-04-17 (×6): 1 [drp] via OPHTHALMIC
  Filled 2018-04-16: qty 5

## 2018-04-16 MED ORDER — FENTANYL BOLUS VIA INFUSION
50.0000 ug | Freq: Once | INTRAVENOUS | Status: AC
  Administered 2018-04-16: 50 ug via INTRAVENOUS
  Filled 2018-04-16: qty 50

## 2018-04-16 MED ORDER — BRINZOLAMIDE 1 % OP SUSP
1.0000 [drp] | Freq: Three times a day (TID) | OPHTHALMIC | Status: DC
  Administered 2018-04-16 – 2018-04-18 (×7): 1 [drp] via OPHTHALMIC
  Filled 2018-04-16: qty 10

## 2018-04-16 MED ORDER — SODIUM CHLORIDE 0.9 % IV SOLN
0.0000 ug/min | INTRAVENOUS | Status: DC
  Administered 2018-04-16: 100 ug/min via INTRAVENOUS
  Administered 2018-04-16: 50 ug/min via INTRAVENOUS
  Administered 2018-04-16: 125 ug/min via INTRAVENOUS
  Filled 2018-04-16 (×3): qty 4

## 2018-04-16 MED ORDER — PHENYLEPHRINE HCL-NACL 40-0.9 MG/250ML-% IV SOLN
100.0000 ug/min | INTRAVENOUS | Status: DC
  Administered 2018-04-17: 100 ug/min via INTRAVENOUS
  Administered 2018-04-17: 115 ug/min via INTRAVENOUS
  Administered 2018-04-17: 110 ug/min via INTRAVENOUS
  Administered 2018-04-17: 125 ug/min via INTRAVENOUS
  Administered 2018-04-18: 110 ug/min via INTRAVENOUS
  Administered 2018-04-18: 100 ug/min via INTRAVENOUS
  Administered 2018-04-18: 110 ug/min via INTRAVENOUS
  Filled 2018-04-16 (×8): qty 250

## 2018-04-16 MED ORDER — SODIUM CHLORIDE 0.9 % IV SOLN
0.0300 [IU]/min | INTRAVENOUS | Status: DC
  Administered 2018-04-16: 0.03 [IU]/min via INTRAVENOUS
  Filled 2018-04-16 (×2): qty 2

## 2018-04-16 NOTE — Progress Notes (Signed)
Daily Progress Note   Patient Name: Ann Cunningham       Date: 04/16/2018 DOB: [birth year]  Age: 77 y.o. MRN#: [id number] Attending Physician: Ann Harms, MD Primary Care Physician: Ann Cunningham Date: 03/26/2018  Reason for Consultation/Follow-up: Establishing goals of care  Subjective: Receiving HD, nods to my voice, appears uncomfortable. Pressors being increased.  Length of Stay: 6  Current Medications: Scheduled Meds:  . aspirin  81 mg Per Tube Daily  . B-complex with vitamin C  1 tablet Per Tube QHS  . brinzolamide  1 drop Both Eyes TID   And  . brimonidine  1 drop Both Eyes TID  . chlorhexidine gluconate (MEDLINE KIT)  15 mL Mouth Rinse BID  . Chlorhexidine Gluconate Cloth  6 each Topical Q0600  . epoetin (EPOGEN/PROCRIT) injection  10,000 Units Intravenous Q T,Th,Sa-HD  . famotidine  20 mg Per Tube Daily  . latanoprost  1 drop Both Eyes QHS  . levothyroxine  25 mcg Intravenous Daily  . mouth rinse  15 mL Mouth Rinse 10 times per day  . metoprolol tartrate  25 mg Per Tube BID  . midodrine  5 mg Per Tube Q8H  . sodium chloride flush  10-40 mL Intracatheter Q12H    Continuous Infusions: . feeding supplement (VITAL AF 1.2 CAL) 45 mL/hr at 04/16/18 0400  . fentaNYL infusion INTRAVENOUS 150 mcg/hr (04/16/18 0558)  . phenylephrine (NEO-SYNEPHRINE) Adult infusion 75 mcg/min (04/16/18 1004)    PRN Meds: acetaminophen **OR** acetaminophen, docusate, heparin, ipratropium-albuterol, metoprolol tartrate, [DISCONTINUED] ondansetron **OR** ondansetron (ZOFRAN) IV, pentafluoroprop-tetrafluoroeth, pentafluoroprop-tetrafluoroeth, sodium chloride flush  Physical Exam    Constitutional: She has a sickly appearance. No distress. She is sedated and intubated.  Frail. HENT:  Head: Normocephalic and atraumatic.  Cardiovascular: Regular rhythm and rate.  Pulmonary/Chest: Breath sounds normal. She is intubated.  Abdominal: Soft. Bowel sounds are normal.  Neurological: Unable to assess orientation.  Skin: Skin is warm and dry.   Vital Signs: BP (!) 88/52   Pulse (!) 118   Temp 99.1 F (37.3 C)   Resp 18   Ht _0  (1.575 m)   Wt 114 lb 13.8 oz (52.1 kg)   SpO2 92%   BMI 21.01 kg/m  SpO2: SpO2: 92 % O2 Device: O2 Device: Ventilator O2 Flow Rate: O2 Flow Rate (L/min): 3 L/min  Intake/output summary:   Intake/Output Summary (Last 24 hours) at 04/16/2018 1349 Last data filed at 04/16/2018 1030 Gross per 24 hour  Intake 1809.08 ml  Output 623 ml  Net 1186.08 ml   LBM: Last BM Date: 04/13/18 Baseline Weight: Weight: 125 lb (56.7 kg) Most recent weight: Weight: 114 lb 13.8 oz (52.1 kg)       Palliative Assessment/Data: PPS 20%    Flowsheet Rows     Most Recent Value  Intake Tab  Referral Department  Nephrology  Unit at Time of Referral  ICU  Palliative Care Primary Diagnosis  Nephrology  Date Notified  04/11/18  Palliative Care Type  New Palliative care  Reason for referral  Clarify Goals of Care  Date of Admission  04/22/2018  Date first seen by Palliative Care  04/12/18  # of days Palliative  referral response time  1 Day(s)  # of days IP prior to Palliative referral  2  Clinical Assessment  Psychosocial & Spiritual Assessment  Palliative Care Outcomes  Patient/Family meeting held?  Yes  Who was at the meeting?  niece and nephew, other niece via speaker phone  Palliative Care Outcomes  Clarified goals of care, Counseled regarding hospice, Provided psychosocial or spiritual support, Changed CPR status      Patient Active Problem List   Diagnosis Date Noted  . Palliative care by specialist   . Goals of care, counseling/discussion   . Bilateral pleural effusion 04/10/2018  . Hypothyroidism 04/10/2018  . Acute on chronic  combined systolic and diastolic CHF (congestive heart failure) ([place]) 04/10/2018  . Acute respiratory failure ([place]) 04/10/2018  . Hyperkalemia 02/12/2018  . Hypotension 01/10/2018  . Protein-calorie malnutrition, severe 10/20/2017  . Encephalopathy 10/19/2017  . ESRD on dialysis ([place]) 09/12/2017  . Essential hypertension 09/12/2017  . Failure to thrive in adult 08/17/2017  . Dyspnea 08/14/2017    Palliative Care Assessment & Plan   HPI: 77 y.o. female  with past medical history of ESRD on HD, cardiomyopathy (EF 30-35%), chronic pleural effusions, HTN, glaucoma, and blindness  admitted on 03/26/2018 with shortness of breath, abdominal pain, and missed HD appts. Patient found to have hypotension and shock. On 7/17 patient had hypoxia post thoracentesis and patient was intubated, had central line and chest tube placed. Per pulmonology, patient may have trapped lung and thoracentesis resulted in fluid shift and worsening respiratory failure. Patient has failed SBT mornings of 7/18 and 7/19. Also found to have enlarged gallbladder on CT - plan for HIDA after extubation. Noted that patient has had 4 admissions in last 6 months - appear to be d/t noncompliance with HD. PMT consulted by nephrology.   Assessment: Met with patient's niece, Ann Cunningham, Linda's husband, and patient's nephew, Ann Cunningham again. Other niece, Ann Cunningham, was supposed to be at meeting but flight was delayed d/t weather. Spoke with patient's other niece, Ann Cunningham via telephone. Patient's significant other, Ann Cunningham, not available. We discussed patient's clinical course including dependence on mechanical ventilator. All of patient's family members agree patient would not want to live like this. Discussed options moving forward including focusing on comfort and freeing Ann Cunningham from the ventilator. Family agrees that this is most reasonable plan of care moving forward. They talk about how patient was "tired" and did not want to go to doctors appointments  or hemodialysis anymore.  Patient's niece Ann Cunningham, will not be here until this evening so they ask that we wait to withdraw care until patient's niece and significant other can be here tomorrow. Planned to meet with them all again tomorrow at 2 pm to confirm withdrawal of care and move forward with comfort care.   Recommendations/Plan:  Plan to meet tomorrow 2pm - most likely withdrawal of care/comfort care following meeting (attempted to call sig other, but no response)  Code Status:  DNR  Prognosis:   < 2 weeks d/t respiratory failure  Discharge Planning:  To Be Determined  Care plan was discussed with Dr. Alva Garnet, bedside RN, patient's family  Thank you for allowing the Palliative Medicine Team to assist in the care of this patient.   Time In: 1400 Time Out: 1510 Total Time 70 minutes Prolonged Time Billed  no       Greater than 50%  of this time was spent counseling and coordinating care related to the above assessment and plan.  Juel Burrow, DNP,  [facility] Palliative Medicine Team Team Phone # [phone]  Pager [phone]

## 2018-04-16 NOTE — Progress Notes (Signed)
CRITICAL CARE NOTE  CC  follow up hypercapnic respiratory failure secondary to bilateral pleural effusions and pneumothorax  SUBJECTIVE Patient remains critically ill Prognosis is guarded  77 year old female with ESRD, CHF, bilateral pleural effusion, and thyroid disease. Patient continues to be intubated and sedated. Patient was on PSV for 8 hours yesterday, but required 20 of pressure support. She returned to the ventilator last night after her oxygen drop to 88%. Patient is receiving hemodialysis this morning during my exam. She continues to be responsive to verbal commands with purposeful movements.    SIGNIFICANT EVENTS  Patient went into atrial fibrillation last night per nursing report. This morning the patient is in sinus tachycardia.    BP 120/70   Pulse (!) 124   Temp 99.8 F (37.7 C) (Axillary)   Resp 15   Ht 5\' 2"  (1.575 m)   Wt 52.5 kg (115 lb 11.9 oz)   SpO2 94%   BMI 21.17 kg/m    REVIEW OF SYSTEMS  PATIENT IS UNABLE TO PROVIDE COMPLETE REVIEW OF SYSTEM S DUE TO INTUBATION   PHYSICAL EXAMINATION:  GENERAL:critically ill appearing, +resp distress HEAD: Normocephalic, atraumatic.  EYES: Pupils equal, round, reactive to light.  No scleral icterus.  MOUTH: Moist mucosal membrane. NECK: Supple. No thyromegaly. No nodules. No JVD.  PULMONARY: +rhonchi, +wheezing CARDIOVASCULAR: S1 and S2. Tachycardia and regular rhythm. No murmurs, rubs, or gallops.  GASTROINTESTINAL: Soft, nontender, -distended. No masses. Positive bowel sounds. No hepatosplenomegaly.  MUSCULOSKELETAL: No swelling, clubbing, or edema.  NEUROLOGIC: alert, following verbal commands with purposeful movement SKIN:right forearm with skin breakdown, left forearm with ulceration. Hyperpigmentation of both anterior feet.   ASSESSMENT AND PLAN SYNOPSIS  77 year old female with hypercapnic respiratory failure secondary to bilateral chronic pleural effusions and right basilar pneumothorax. Patient  has ESRD as well and is receiving hemodialysis.  Patient is status DO NOT RESUSCITATE. Family meeting with palliative care is scheduled for this afternoon.   Severe Hypoxic and Hypercapnic Respiratory Failure -continue Full MV support -continue Bronchodilator Therapy -Wean Fio2 and PEEP as tolerated -will perform SBT today if patient tolerates  -patient on pressure support for 8 hrs yesterday with  pressure of 20 -right basilar pneumothorax: without change on CXR 7/23. Chest tube on waterseal for past 24 hours. Consider chest tube removal once family meeting has happened.   ESRD: -followed by nephrology. HD treatment today   NEUROLOGY - intubated and sedated with fentanyl - minimal sedation to achieve a RASS goal: -1 -patient following verbal commands   CARDIAC -ICU monitoring -Currently tachycardic into the 160s. Continue metoprolol BID and PRN -levophed re-started last night and into this morning for pressure support  -d/c levophed and start phenylephrine as levophed may be  contributing to tachycarida  GI: -RUQ US 7/17: distention of gallbladder with possible underlying inflammation. Need HIDA scan for further evaluation -lipase and liver function normal. Albumin low  ID -atelectasis vs consolidation of left lower lobe on CXR today. Patient afebrile. White count 12.9--> 18.0. Patient afebrile  -blood cultures negative.   -pleural fluid: negative culture, no growth   -exudative with predominant lymphocytes -consider respiratory culture   DVT: SCD GI: famotidine  TRANSFUSIONS AS NEEDED MONITOR FSBS ASSESS the need for LABS as needed   Critical Care Time devoted to patient care services described in this note is 30 minutes.   Overall, patient is critically ill, prognosis is guarded.  Patient with Multiorgan failure and at high risk for cardiac arrest and death.

## 2018-04-16 NOTE — Progress Notes (Signed)
B/P improved.

## 2018-04-16 NOTE — Progress Notes (Signed)
HD TX complete, UF 503mL    04/16/18 1015  Vital Signs  Pulse Rate (!) 101  Resp 20  BP 92/72  Oxygen Therapy  SpO2 98 %  End [place] CO2 (EtCO2) 52  During Hemodialysis Assessment  HD Safety Checks Performed Yes  Dialysis Fluid Bolus Normal Saline  Bolus Amount (mL) 250 mL  Intra-Hemodialysis Comments Tx completed;Tolerated well

## 2018-04-16 NOTE — Progress Notes (Signed)
Nutrition Follow-up  DOCUMENTATION CODES:   Severe malnutrition in context of chronic illness  INTERVENTION:  Continue Vital AF 1.2 at 45 mL/hr (1080 mL goal daily volume) via OGT. Provides 1296 kcal, 81 grams of protein, 1823 mg potassium, 912 mg phosphorus, 874 mL H2O daily.  Continue free water flush of 20 mL Q4hrs to maintain tube patency.  Continue B-complex with vitamin C QHS per tube.  Will monitor outcome of discussion regarding goals of care.  NUTRITION DIAGNOSIS:   Severe Malnutrition related to chronic illness(ESRD on HD, CHF) as evidenced by severe fat depletion, severe muscle depletion.  Ongoing - addressing with TF regimen.  GOAL:   Provide needs based on ASPEN/SCCM guidelines  Met with TF regimen.  MONITOR:   Vent status, Labs, Weight trends, TF tolerance, Skin, I & O's  REASON FOR ASSESSMENT:   Ventilator    ASSESSMENT:   77 year old female with PMHx of HTN, hypothyroidism, CHF, glaucoma with blindness, ESRD on HD TTS admitted with pleural effusion s/p US guided right-sided thoracentesis on 7/17 and later developed a pneumothorax and required emergent intubation on 7/17.  Patient remains intubated and sedated. Was on PRVC mode with FiO2 30% at time of RD assessment. Had HD this AM. Blood pressure decreased today so patient was started on phenylephrine gtt.  Access: 18 Fr. OGT placed 7/17; terminates in stomach per abdominal x-ray 7/17; 50 cm at corner of mouth  MAP: 68-87 mmHg  TF: pt tolerating Vital AF 1.2 at 45 mL/hr; per pump history patient received 1059 mL tube feeds in the past 24 hrs (98.1% goal daily volume); receiving free water flush of 20 mL Q4hrs  Patient is currently intubated on ventilator support Ve: 7.4 L/min Temp (24hrs), Avg:99.2 F (37.3 C), Min:98.2 F (36.8 C), Max:99.8 F (37.7 C)  Propofol: N/A  Medications reviewed and include: B-complex with vitamin C QHS per tube, Epogen 10000 units TTS with HD, famotidine,  levothyroxine, fentanyl gtt, phenylephrine gtt at 75 mcg/min.  Labs reviewed: CBG 73-94, Chloride 97, BUN 69, Creatinine 3.04, BNP 775. Potassium WNL today. On 7/22 Potassium and Phosphorus WNL (Phosphorus was initially elevated but then decreased to 1.7 on 7/21 as patient was refeeding).  I/O: pt anuric; 120 mL output from chest tube yesterday; 503 mL removed from HD today  Weight trend: 52.1 kg on 7/23; +2.7 kg from admission  Discussed with RN and on rounds. Plan is for family meeting today to discuss goals of care.  Diet Order:   Diet Order    None      EDUCATION NEEDS:   No education needs have been identified at this time  Skin:  Skin Assessment: Reviewed RN Assessment(old scarring on sacrum)  Last BM:  04/12/2018 - small type 6  Height:   Ht Readings from Last 1 Encounters:  04/10/18 5' 2"  (1.575 m)    Weight:   Wt Readings from Last 1 Encounters:  04/16/18 114 lb 13.8 oz (52.1 kg)    Ideal Body Weight:  50 kg  BMI:  Body mass index is 21.01 kg/m.  Estimated Nutritional Needs:   Kcal:  1255 (25 kcal/kg)  Protein:  75-90 grams (1.5-1.8 grams/kg)  Fluid:  UOP + 1 L  Willey Blade, MS, RD, LDN Office: [phone] Pager: [phone] After Hours/Weekend Pager: [phone]

## 2018-04-16 NOTE — Progress Notes (Signed)
Interesting day. B/P low. Levophed D/Ced and started on Neo-Synephrine. Max limit of Neo. per Dr. Bard HerbertSimmonds' order reached at 1407. Consulted with Dr. Belia HemanKasa. New Neo limit 400 mcgs/min. and added Vasopressin at .03 units/min. Fentanyl rate increased to 26800mcg/hour due to restlessness. Dialysis today. Less alert or interactive. Extremities stiffer and less responsive to ROM. Metoprolol given x 2 for heart rate greater than 115. Family in and family meeting postponed to Wednesday at 2 pm.

## 2018-04-16 NOTE — Progress Notes (Signed)
Pre HD assessment   04/16/18 0710  Neurological  Level of Consciousness Responds to Voice  Orientation Level Intubated/Tracheostomy - Unable to assess  Respiratory  Respiratory Pattern Regular;Unlabored;Symmetrical  Chest Assessment Chest expansion symmetrical  Bilateral Breath Sounds Rhonchi  Cough None  Cardiac  Pulse Regular  Heart Sounds S1, S2  ECG Monitor Yes  Vascular  R Radial Pulse +2  L Radial Pulse +2  Edema Generalized  Generalized Edema +1  Psychosocial  Psychosocial (WDL) X  Patient Behaviors Appropriate for situation;Not interactive

## 2018-04-16 NOTE — Progress Notes (Signed)
1235 Given prn for sustained heart rate in 130s.

## 2018-04-16 NOTE — Progress Notes (Signed)
Pressors titrated up by primary RN

## 2018-04-16 NOTE — Progress Notes (Signed)
[facility], [place] 04/16/18  Subjective:   Patient opens eyes to voice, able to follow some simple commands Remains on ventilator support, levophed iv Tachycardic, irregular with A Fib/RVR in 130's NG tube feeds 45 cc/hr   Objective:  Vital signs in last 24 hours:  Temp:  [98.2 F (36.8 C)-99.8 F (37.7 C)] 99.1 F (37.3 C) (07/23 0800) Pulse Rate:  [94-126] 94 (07/23 0915) Resp:  [15-30] 18 (07/23 0915) BP: (71-147)/(45-73) 84/58 (07/23 0915) SpO2:  [93 %-100 %] 97 % (07/23 0915) FiO2 (%):  [30 %] 30 % (07/23 0709) Weight:  [52.4 kg (115 lb 8.3 oz)-52.5 kg (115 lb 11.9 oz)] 52.5 kg (115 lb 11.9 oz) (07/23 0715)  Weight change: 1.9 kg (4 lb 3 oz) Filed Weights   04/15/18 0500 04/16/18 0322 04/16/18 0715  Weight: 50.5 kg (111 lb 5.3 oz) 52.4 kg (115 lb 8.3 oz) 52.5 kg (115 lb 11.9 oz)    Intake/Output:    Intake/Output Summary (Last 24 hours) at 04/16/2018 0931 Last data filed at 04/16/2018 0900 Gross per 24 hour  Intake 1905.51 ml  Output 120 ml  Net 1785.51 ml     Physical Exam: General: Critically ill appearing  HEENT ETT in place  Pulm/lungs Vent assisted, coarse breath sounds b/l, rt chest tube  CVS/Heart Tachycardic, irregular  Abdomen:  soft  Extremities: Trace edema  Neurologic: Opens eyes to voice commands, follows few simple commands  Skin: warm  Access: Rt forearm AVF       Basic Metabolic Panel:  Recent Labs  Lab 04/10/18 1229 04/11/18 0445 04/12/18 0405 04/13/18 0608 04/14/18 0424 04/15/18 0606 04/16/18 0320  NA 138 142 142 140 137 138 136  K 4.7 4.9 4.5 4.2 3.8 4.4 4.8  CL 102 105 104 100 99 97* 97*  CO2 _0 GLUCOSE 225* 143* 108* 111* 125* 117* 103*  BUN 74* 77* 53* 47* 36* 55* 69*  CREATININE 4.12* 4.21* 3.24* 2.59* 1.88* 2.60* 3.04*  CALCIUM 8.2* 8.5* 8.2* 7.7* 7.6* 7.9* 8.2*  MG 2.3 2.3 1.9 2.2 1.7  --   --   PHOS  --  5.4* 4.1 2.6 1.7*  1.7* 4.0  --      CBC: Recent Labs   Lab 03/30/2018 2125  04/11/18 0445 04/12/18 0405 04/13/18 0608 04/14/18 0424 04/15/18 0606 04/16/18 0320  WBC 7.1   < > 10.0 12.0* 12.3* 13.6* 12.9* 18.0*  NEUTROABS 4.8  --  8.9* 10.4*  --  11.8* 10.2*  --   HGB 10.1*   < > 9.6* 8.9* 8.3* 8.2* 8.1* 8.0*  HCT 31.2*   < > 29.4* 27.6* 25.9* 25.4* 25.3* 25.0*  MCV 86.5   < > 87.3 87.8 86.9 87.2 87.4 87.4  PLT 389   < > 355 261 236 247 315 341   < > = values in this interval not displayed.      Lab Results  Component Value Date   HEPBSAG Negative 08/15/2017   HEPBSAB Non Reactive 08/15/2017   HEPBIGM Negative 08/15/2017      Microbiology:  Recent Results (from the past 240 hour(s))  MRSA PCR Screening     Status: None   Collection Time: 04/10/18  3:02 AM  Result Value Ref Range Status   MRSA by PCR NEGATIVE NEGATIVE Final    Comment:        The GeneXpert MRSA Assay (FDA approved for NASAL specimens only), is one component of a  comprehensive MRSA colonization surveillance program. It is not intended to diagnose MRSA infection nor to guide or monitor treatment for MRSA infections. Performed at [facility] [address] [place], [place] [address]   CULTURE, BLOOD (ROUTINE X 2) w Reflex to ID Panel     Status: None   Collection Time: 04/10/18 12:29 PM  Result Value Ref Range Status   Specimen Description BLOOD L HAND  Final   Special Requests   Final    BOTTLES DRAWN AEROBIC AND ANAEROBIC Blood Culture adequate volume   Culture   Final    NO GROWTH 5 DAYS Performed at [facility], [address], [place], [place] [address]    Report Status 04/15/2018 FINAL  Final  Body fluid culture with gram stain     Status: None   Collection Time: 04/10/18 12:40 PM  Result Value Ref Range Status   Specimen Description   Final    PLEURAL Performed at [facility], [address], [place], [place] [address]    Special Requests   Final    PLEURAL Performed at [facility], [address] [place], [place] [address]    Gram Stain   Final    ABUNDANT WBC PRESENT, PREDOMINANTLY MONONUCLEAR NO ORGANISMS SEEN    Culture   Final    NO GROWTH 3 DAYS Performed at [facility], [place] [address], [place], [place] [address]    Report Status 04/13/2018 FINAL  Final  CULTURE, BLOOD (ROUTINE X 2) w Reflex to ID Panel     Status: None   Collection Time: 04/10/18  3:09 PM  Result Value Ref Range Status   Specimen Description BLOOD LT HAND  Final   Special Requests   Final    BOTTLES DRAWN AEROBIC AND ANAEROBIC Blood Culture adequate volume   Culture   Final    NO GROWTH 5 DAYS Performed at [facility], [address], [place], [place] [address]    Report Status 04/15/2018 FINAL  Final    Coagulation Studies: No results for input(s): LABPROT, INR in the last 72 hours.  Urinalysis: No results for input(s): COLORURINE, LABSPEC, PHURINE, GLUCOSEU, HGBUR, BILIRUBINUR, KETONESUR, PROTEINUR, UROBILINOGEN, NITRITE, LEUKOCYTESUR in the last 72 hours.  Invalid input(s): APPERANCEUR    Imaging: Dg Chest Port 1 View  Result Date: 04/16/2018 CLINICAL DATA:  Respiratory failure EXAM: PORTABLE CHEST 1 VIEW COMPARISON:  04/14/2018, 04/13/2018, 04/12/2018, 04/15/2018 FINDINGS: Endotracheal tube tip is about 3.4 cm superior to the carina. Esophageal tube tip is below the diaphragm and in the left upper quadrant. Right-sided central venous catheter tip overlies the cavoatrial region. Right-sided chest tube remains in place. No change in small right lateral basal pneumothorax. Small bilateral pleural effusions. Dense consolidation at the left base. Stable cardiomegaly. IMPRESSION: 1. Support lines and tubes as above. No significant interval change in small right lateral and basilar pneumothorax 2. Cardiomegaly with vascular congestion and continued pleural effusions. 3. No change in left greater than right bibasilar airspace disease which may reflect atelectasis or  pneumonia Electronically Signed   By: Donavan Foil M.D.   On: 04/16/2018 03:57   Dg Chest Port 1 View  Result Date: 04/15/2018 CLINICAL DATA:  Follow-up pneumothorax EXAM: PORTABLE CHEST 1 VIEW COMPARISON:  04/14/2018 FINDINGS: Cardiac shadow is stable. Endotracheal tube and nasogastric catheter are again noted in satisfactory position. Right-sided pigtail catheter is again noted with mild residual right pneumothorax laterally and inferiorly stable from the previous exam. Right  jugular line is again noted and stable. Left basilar atelectasis has improved slightly in the interval from the prior exam. No new focal abnormality is noted. IMPRESSION: Stable right basilar pneumothorax with chest tube in place. Slight improvement in the degree of left basilar atelectasis. Electronically Signed   By: Inez Catalina M.D.   On: 04/15/2018 07:10     Medications:   . feeding supplement (VITAL AF 1.2 CAL) 45 mL/hr at 04/16/18 0400  . fentaNYL infusion INTRAVENOUS 150 mcg/hr (04/16/18 0558)  . phenylephrine (NEO-SYNEPHRINE) Adult infusion     . aspirin  81 mg Per Tube Daily  . B-complex with vitamin C  1 tablet Per Tube QHS  . chlorhexidine gluconate (MEDLINE KIT)  15 mL Mouth Rinse BID  . Chlorhexidine Gluconate Cloth  6 each Topical Q0600  . epoetin (EPOGEN/PROCRIT) injection  10,000 Units Intravenous Q T,Th,Sa-HD  . famotidine  20 mg Per Tube Daily  . latanoprost  1 drop Both Eyes QHS  . levothyroxine  25 mcg Intravenous Daily  . mouth rinse  15 mL Mouth Rinse 10 times per day  . metoprolol tartrate  25 mg Per Tube BID  . midodrine  5 mg Per Tube Q8H  . sodium chloride flush  10-40 mL Intracatheter Q12H   acetaminophen **OR** acetaminophen, docusate, heparin, ipratropium-albuterol, metoprolol tartrate, [DISCONTINUED] ondansetron **OR** ondansetron (ZOFRAN) IV, pentafluoroprop-tetrafluoroeth, pentafluoroprop-tetrafluoroeth, sodium chloride flush  Assessment/ Plan:  77 y.o.African [place] female  with end stage renal disease on hemodialysis TTS, hypertension, tobacco abuse, glaucoma with blindness, hypothyroidism, congestive heart failure who is admitted to [facility] on 03/27/2018  Patient with large volume right sided thoracentesis on 7/17 by radiology, however patient developed a pneumothorax and required mechanical ventilation and intubation. Chest tube placed  [facility] TTS [facility] EDW 51.5kg Right AVF  1. ESRD - TTS schedule, 17 G needles, BFR 300 - patient seen during HD. Fluid removal is limited due to Tachycardia, hemodynamic instability  2. Chronic hypotension - midodrine as outpatient  3. Secondary HPTH - Phos 4.0 (7/22)  4. Anemia of CKD - EPO with HD  5. Acute respiratory failure - Rt Pneumothorax with Rt  Chest tube - vent assisted   LOS: [place] 7/23/20199:31 AM  [place], [place]  Note: This note was prepared with Dragon dictation. Any transcription errors are unintentional

## 2018-04-16 NOTE — Progress Notes (Signed)
[id number] Started on Neo-Synephrine 40mg  in 250 mls Sodium Chloride at 50 mcgs/min. 1004 Neo increased to 75 mcgs/min. For low B/P see vital sign sheets.

## 2018-04-16 NOTE — Progress Notes (Signed)
1610 Dr. Belia HemanKasa notified of slow drop in patients blood pressure. Orders received.

## 2018-04-16 NOTE — Progress Notes (Signed)
[facility]   04/16/18 0715  Hand-Off documentation  Report given to (Full Name) Laretta Alstromoya Stephens [place], RN   Report received from (Full Name) Ledora Bottcheriffany Fairing, RN   Vital Signs  Temp 99.8 F (37.7 C)  Temp Source Axillary  Pulse Rate (!) 112  Pulse Rate Source Monitor  Resp 16  BP 117/61  BP Location Left Arm  BP Method Automatic  Patient Position (if appropriate) Lying  Oxygen Therapy  SpO2 96 %  O2 Device Ventilator  End Tidal CO2 (EtCO2) 53  Pain Assessment  Pain Scale CPOT  Critical Care Pain Observation Tool (CPOT)  Facial Expression 0  Body Movements 0  Muscle Tension 0  Compliance with ventilator (intubated pts.) 0  Vocalization (extubated pts.) N/A  CPOT Total 0  Dialysis Weight  Weight 52.5 kg (115 lb 11.9 oz)  Type of Weight Pre-Dialysis  Time-Out for Hemodialysis  What Procedure? HD  Pt Identifiers(min of two) First/Last Name;MRN/Account#  Correct Site? Yes  Correct Side? Yes  Correct Procedure? Yes  Consents Verified? Yes  Rad Studies Available? N/A  Safety Precautions Reviewed? Yes  Biochemist, clinicalMachine Checks  Machine Number [id number]  Station Number  (Bedside ICU )  UF/Alarm Test Passed  Conductivity: Meter 14  Conductivity: Machine  13.9  pH 7.4  Reverse Osmosis Main  Normal Saline Lot Number [id number]  Dialyzer Lot Number [id number]  Disposable Set Lot Number [id number]  Machine Temperature 98.6 F (37 C)  ImmunologistAir Detector Armed and Audible Yes  Blood Lines Intact and Secured Yes  Pre Treatment Patient Checks  Vascular access used during treatment Fistula  Hepatitis B Surface Antigen Results Negative  Date Hepatitis B Surface Antigen Drawn 08/15/17  Isolation Initiated Yes  Hepatitis B Surface Antibody  (<10)  Date Hepatitis B Surface Antibody Drawn 08/15/17  Hemodialysis Consent Verified Yes  Hemodialysis Standing Orders Initiated Yes  ECG (Telemetry) Monitor On Yes  Prime Ordered Normal Saline  Length of  DialysisTreatment -hour(s) 3 Hour(s)  Dialysis Treatment  Comments Na 140  Dialyzer Elisio 17H NR  Dialysate 2K, 2.5 Ca  Dialysis Anticoagulant None  Dialysate Flow Ordered 600  Blood Flow Rate Ordered 300 mL/min  Ultrafiltration Goal 0.5 Liters  Dialysis Blood Pressure Support Ordered Normal Saline  During Hemodialysis Assessment  Blood Flow Rate (mL/min) 300 mL/min  Arterial Pressure (mmHg) -160 mmHg  Venous Pressure (mmHg) 170 mmHg  Transmembrane Pressure (mmHg) 50 mmHg  Ultrafiltration Rate (mL/min) 330 mL/min  Dialysate Flow Rate (mL/min) 600 ml/min  Conductivity: Machine  13.6  HD Safety Checks Performed Yes  Dialysis Fluid Bolus Normal Saline  Bolus Amount (mL) 250 mL  Intra-Hemodialysis Comments Tx initiated  Education / Care Plan  Dialysis Education Provided No (Comment) (Pt not interactive, intubated )  Fistula / Graft Right Forearm Arteriovenous fistula  Placement Date/Time: 10/19/17 0850   Placed prior to admission: No  Orientation: Right  Access Location: Forearm  Access Type: Arteriovenous fistula  Site Condition No complications  Fistula / Graft Assessment Thrill;Present;Bruit  Status Patent;Accessed  Needle Size 17  Drainage Description None

## 2018-04-16 NOTE — Progress Notes (Signed)
Post HD Tx no change from baseline, pt is following commands , making purposeful movement, pt became hypotensive   04/16/18 1036  Neurological  Level of Consciousness Responds to Voice  Orientation Level Intubated/Tracheostomy - Unable to assess  Respiratory  Respiratory Pattern Regular;Unlabored;Symmetrical  Chest Assessment Chest expansion symmetrical  Bilateral Breath Sounds Rhonchi  Cough None  Cardiac  Pulse Regular  Heart Sounds S1, S2  ECG Monitor Yes  Vascular  R Radial Pulse +2  L Radial Pulse +2  Edema Generalized  Generalized Edema +1  Psychosocial  Psychosocial (WDL) X  Patient Behaviors Appropriate for situation;Not interactive   otherwise tolerated tx well.

## 2018-04-16 NOTE — Progress Notes (Signed)
HD Tx Started   04/16/18 0715  Hand-Off documentation  Report given to (Full Name) Laretta Alstromoya Jae Skeet, RN   Report received from (Full Name) Ledora Bottcheriffany Fairing, RN   Vital Signs  Temp 99.8 F (37.7 C)  Temp Source Axillary  Pulse Rate (!) 112  Pulse Rate Source Monitor  Resp 16  BP 117/61  BP Location Left Arm  BP Method Automatic  Patient Position (if appropriate) Lying  Oxygen Therapy  SpO2 96 %  O2 Device Ventilator  End Tidal CO2 (EtCO2) 53  Pain Assessment  Pain Scale CPOT  Critical Care Pain Observation Tool (CPOT)  Facial Expression 0  Body Movements 0  Muscle Tension 0  Compliance with ventilator (intubated pts.) 0  Vocalization (extubated pts.) N/A  CPOT Total 0  Dialysis Weight  Weight 52.5 kg (115 lb 11.9 oz)  Type of Weight Pre-Dialysis  Time-Out for Hemodialysis  What Procedure? HD  Pt Identifiers(min of two) First/Last Name;MRN/Account#  Correct Site? Yes  Correct Side? Yes  Correct Procedure? Yes  Consents Verified? Yes  Rad Studies Available? N/A  Safety Precautions Reviewed? Yes  Biochemist, clinicalMachine Checks  Machine Number [id number]  Station Number  (Bedside ICU )  UF/Alarm Test Passed  Conductivity: Meter 14  Conductivity: Machine  13.9  pH 7.4  Reverse Osmosis Main  Normal Saline Lot Number [id number]  Dialyzer Lot Number [id number]  Disposable Set Lot Number [id number]  Machine Temperature 98.6 F (37 C)  ImmunologistAir Detector Armed and Audible Yes  Blood Lines Intact and Secured Yes  Pre Treatment Patient Checks  Vascular access used during treatment Fistula  Hepatitis B Surface Antigen Results Negative  Date Hepatitis B Surface Antigen Drawn 08/15/17  Isolation Initiated Yes  Hepatitis B Surface Antibody  (<10)  Date Hepatitis B Surface Antibody Drawn 08/15/17  Hemodialysis Consent Verified Yes  Hemodialysis Standing Orders Initiated Yes  ECG (Telemetry) Monitor On Yes  Prime Ordered Normal Saline  Length of  DialysisTreatment -hour(s) 3 Hour(s)  Dialysis Treatment  Comments Na 140  Dialyzer Elisio 17H NR  Dialysate 2K, 2.5 Ca  Dialysis Anticoagulant None  Dialysate Flow Ordered 600  Blood Flow Rate Ordered 300 mL/min  Ultrafiltration Goal 0.5 Liters  Dialysis Blood Pressure Support Ordered Normal Saline  During Hemodialysis Assessment  Blood Flow Rate (mL/min) 300 mL/min  Arterial Pressure (mmHg) -160 mmHg  Venous Pressure (mmHg) 170 mmHg  Transmembrane Pressure (mmHg) 50 mmHg  Ultrafiltration Rate (mL/min) 330 mL/min  Dialysate Flow Rate (mL/min) 600 ml/min  Conductivity: Machine  13.6  HD Safety Checks Performed Yes  Dialysis Fluid Bolus Normal Saline  Bolus Amount (mL) 250 mL  Intra-Hemodialysis Comments Tx initiated  Education / Care Plan  Dialysis Education Provided No (Comment) (Pt not interactive, intubated )  Fistula / Graft Right Forearm Arteriovenous fistula  Placement Date/Time: 10/19/17 0850   Placed prior to admission: No  Orientation: Right  Access Location: Forearm  Access Type: Arteriovenous fistula  Site Condition No complications  Fistula / Graft Assessment Thrill;Present;Bruit  Status Patent;Accessed  Needle Size 17  Drainage Description None

## 2018-04-16 NOTE — Progress Notes (Signed)
Remains intubated and sedated on fentanyl infusion. RASS - 2.  Not F/C.  Did not meet criteria for SBT.  Appears uncomfortable  Vitals:   04/16/18 0945 04/16/18 1000 04/16/18 1015 04/16/18 1030  BP: (!) 86/66 (!) 83/52 92/72 (!) 88/52  Pulse: (!) 115 94 (!) 101 (!) 118  Resp: 18 17 20 18   Temp:      TempSrc:      SpO2: 95% 97% 98% 93%  Weight:    114 lb 13.8 oz (52.1 kg)  Height:       Vent Mode: PRVC FiO2 (%):  [30 %] 30 % Set Rate:  [16 bmp] 16 bmp Vt Set:  [400 mL] 400 mL PEEP:  [5 cmH20] 5 cmH20 Pressure Support:  [20 cmH20] 20 cmH20 Plateau Pressure:  [21 cmH20] 21 cmH20  Very frail and cachectic HEENT NCAT, sclerae white No JVD, no lymphadenopathy Prolonged expiratory phase, diffuse bilateral rhonchi Tachy, regular, no M Abdomen soft, NABS Extremities warm, no edema No focal neurologic deficits  BMP Latest Ref Rng & Units 04/16/2018 04/15/2018 04/14/2018  Glucose 70 - 99 mg/dL 578(I103(H) 696(E117(H) 952(W125(H)  BUN 8 - 23 mg/dL 41(L69(H) 24(M55(H) 01(U36(H)  Creatinine 0.44 - 1.00 mg/dL 2.72(Z3.04(H) 3.66(Y2.60(H) 4.03(K1.88(H)  Sodium 135 - 145 mmol/L 136 138 137  Potassium 3.5 - 5.1 mmol/L 4.8 4.4 3.8  Chloride 98 - 111 mmol/L 97(L) 97(L) 99  CO2 22 - 32 mmol/L 31 30 30   Calcium 8.9 - 10.3 mg/dL 8.2(L) 7.9(L) 7.6(L)   CBC Latest Ref Rng & Units 04/16/2018 04/15/2018 04/14/2018  WBC 3.6 - 11.0 K/uL 18.0(H) 12.9(H) 13.6(H)  Hemoglobin 12.0 - 16.0 g/dL 8.0(L) 8.1(L) 8.2(L)  Hematocrit 35.0 - 47.0 % 25.0(L) 25.3(L) 25.4(L)  Platelets 150 - 440 K/uL 341 315 247   Chest tube: On waterseal, no air leak  CXR: Persistent right basilar pneumothorax.  Probably increased right basilar opacity.  NSC LLL atelectasis  IMPRESSION: Acute on chronic respiratory failure with hypoxemia Chronic bilateral pleural effusions Right pneumothorax after thoracentesis.  No air leak.  Might be an ex vacuo PTX  Prolonged ventilator dependence.  Remains on wean able Dilated cardiomyopathy Intermittent sinus tachycardia ESRD, HD  today Adult failure to thrive.  SNF resident Severe protein-calorie malnutrition/cachexia  PLAN/REC: Cont current vent support - settings reviewed and/or adjusted Continue PSV mode as tolerated Cont vent bundle Daily SBT if/when meets criteria Continue scheduled metoprolol Continue IV metoprolol as needed to maintain HR <115/min HD today per nephrology service Continue nutritional support  Goals of care conference today.  I have spoken with palliative care team and indicated that I do not believe she can be successfully extubated anytime in the near future.  I favor terminal extubation and comfort care  DVT px: SCDs SUP: enteral famotidine   Billy Fischeravid Modest Draeger, MD PCCM service Mobile [phone] Pager [phone] 04/16/2018 11:43 AM

## 2018-04-16 NOTE — Progress Notes (Signed)
[facility] - [place] at [facility]   PATIENT NAME: Ann Cunningham    MR#:  [id number]  DATE OF BIRTH:  27-Nov-1940  SUBJECTIVE:  CHIEF COMPLAINT:   Chief Complaint  Patient presents with  . Shortness of Breath  d/w intensivist - poor prognosis, to discuss with family regarding one-way extubation  REVIEW OF SYSTEMS:  CONSTITUTIONAL: No fever, fatigue or weakness.  EYES: No blurred or double vision.  EARS, NOSE, AND THROAT: No tinnitus or ear pain.  RESPIRATORY: No cough, shortness of breath, wheezing or hemoptysis.  CARDIOVASCULAR: No chest pain, orthopnea, edema.  GASTROINTESTINAL: No nausea, vomiting, diarrhea or abdominal pain.  GENITOURINARY: No dysuria, hematuria.  ENDOCRINE: No polyuria, nocturia,  HEMATOLOGY: No anemia, easy bruising or bleeding SKIN: No rash or lesion. MUSCULOSKELETAL: No joint pain or arthritis.   NEUROLOGIC: No tingling, numbness, weakness.  PSYCHIATRY: No anxiety or depression.   ROS  DRUG ALLERGIES:   Allergies  Allergen Reactions  . Penicillins Other (See Comments)    Has patient had a PCN reaction causing immediate rash, facial/tongue/throat swelling, SOB or lightheadedness with hypotension: Unknown Has patient had a PCN reaction causing severe rash involving mucus membranes or skin necrosis: Unknown Has patient had a PCN reaction that required hospitalization: Unknown Has patient had a PCN reaction occurring within the last 10 years: Unknown If all of the above answers are "NO", then may proceed with Cephalosporin use.     VITALS:  Blood pressure (!) 88/48, pulse 98, temperature 99.1 F (37.3 C), resp. rate 20, height 5\' 2"  (1.575 m), weight 52.1 kg (114 lb 13.8 oz), SpO2 95 %.  PHYSICAL EXAMINATION:  GENERAL:  77 y.o.-year-old patient lying in the bed with no acute distress.  EYES: Pupils equal, round, reactive to light and accommodation. No scleral icterus. Extraocular muscles intact.  HEENT: Head atraumatic,  normocephalic. Oropharynx and nasopharynx clear.  NECK:  Supple, no jugular venous distention. No thyroid enlargement, no tenderness.  LUNGS: Normal breath sounds bilaterally, no wheezing, rales,rhonchi or crepitation. No use of accessory muscles of respiration.  CARDIOVASCULAR: S1, S2 normal. No murmurs, rubs, or gallops.  ABDOMEN: Soft, nontender, nondistended. Bowel sounds present. No organomegaly or mass.  EXTREMITIES: No pedal edema, cyanosis, or clubbing.  NEUROLOGIC: Cranial nerves II through XII are intact. Muscle strength 5/5 in all extremities. Sensation intact. Gait not checked.  PSYCHIATRIC: The patient is alert and oriented x 3.  SKIN: No obvious rash, lesion, or ulcer.   Physical Exam LABORATORY PANEL:   CBC Recent Labs  Lab 04/16/18 0320  WBC 18.0*  HGB 8.0*  HCT 25.0*  PLT 341   ------------------------------------------------------------------------------------------------------------------  Chemistries  Recent Labs  Lab 04/13/18 0608 04/14/18 0424  04/16/18 0320  NA 140 137   < > 136  K 4.2 3.8   < > 4.8  CL 100 99   < > 97*  CO2 31 30   < > 31  GLUCOSE 111* 125*   < > 103*  BUN 47* 36*   < > 69*  CREATININE 2.59* 1.88*   < > 3.04*  CALCIUM 7.7* 7.6*   < > 8.2*  MG 2.2 1.7  --   --   AST 59*  --   --   --   ALT 42  --   --   --   ALKPHOS 120  --   --   --   BILITOT 0.6  --   --   --    < > =  values in this interval not displayed.   ------------------------------------------------------------------------------------------------------------------  Cardiac Enzymes Recent Labs  Lab 04/10/18 1827 04/11/18 0046  TROPONINI 0.07* 0.07*   ------------------------------------------------------------------------------------------------------------------  RADIOLOGY:  Dg Chest Port 1 View  Result Date: 04/16/2018 CLINICAL DATA:  Respiratory failure EXAM: PORTABLE CHEST 1 VIEW COMPARISON:  04/14/2018, 04/13/2018, 04/12/2018, 04/15/2018 FINDINGS:  Endotracheal tube tip is about 3.4 cm superior to the carina. Esophageal tube tip is below the diaphragm and in the left upper quadrant. Right-sided central venous catheter tip overlies the cavoatrial region. Right-sided chest tube remains in place. No change in small right lateral basal pneumothorax. Small bilateral pleural effusions. Dense consolidation at the left base. Stable cardiomegaly. IMPRESSION: 1. Support lines and tubes as above. No significant interval change in small right lateral and basilar pneumothorax 2. Cardiomegaly with vascular congestion and continued pleural effusions. 3. No change in left greater than right bibasilar airspace disease which may reflect atelectasis or pneumonia Electronically Signed   By: Jasmine Pang M.D.   On: 04/16/2018 03:57   Dg Chest Port 1 View  Result Date: 04/15/2018 CLINICAL DATA:  Follow-up pneumothorax EXAM: PORTABLE CHEST 1 VIEW COMPARISON:  04/14/2018 FINDINGS: Cardiac shadow is stable. Endotracheal tube and nasogastric catheter are again noted in satisfactory position. Right-sided pigtail catheter is again noted with mild residual right pneumothorax laterally and inferiorly stable from the previous exam. Right jugular line is again noted and stable. Left basilar atelectasis has improved slightly in the interval from the prior exam. No new focal abnormality is noted. IMPRESSION: Stable right basilar pneumothorax with chest tube in place. Slight improvement in the degree of left basilar atelectasis. Electronically Signed   By: Alcide Clever M.D.   On: 04/15/2018 07:10    ASSESSMENT AND PLAN:  Patient is a 77 year old white female end-stage renal disease  1. Acute respiratory failure status post thoracentesis Stable Patient had pneumothorax now with chest tube in place Continue ventilator weaning as tolerated Case discussed with intensivist, given poor prognosis going forward, plans for palliative care discussion/meeting with family later today to  discuss possible one-way extubation  2. Bilateral exudative pleural effusion  Stable  Status post drainage,empiric antibiotics   3. End-stage renal disease  HD Per nephrology on tomorrow   4. Hypothyroidism continue Synthroid  5. History of CHF Stable For HD on tomorrow  Very poor prognosis-palliative care following   All the records are reviewed and case discussed with Care Management/Social Workerr. Management plans discussed with the patient, family and they are in agreement.  CODE STATUS: dnr  TOTAL TIME TAKING CARE OF THIS PATIENT: 35 minutes.     POSSIBLE D/C IN 5 DAYS, DEPENDING ON CLINICAL CONDITION.   Evelena Asa Christiano Blandon M.D on 04/16/2018   Between 7am to 6pm - Pager - [phone]  After 6pm go to [email or web address] - Social research officer, government  [facility]  Office  [phone]  CC: Primary care physician; Center, [facility]  Note: This dictation was prepared with Dragon dictation along with smaller phrase technology. Any transcriptional errors that result from this process are unintentional.

## 2018-04-16 NOTE — Progress Notes (Signed)
Post HD Tx    04/16/18 1030  Vital Signs  Pulse Rate (!) 118  Resp 18  BP (!) 88/52  Oxygen Therapy  SpO2 93 %  End [place] CO2 (EtCO2) 51  Pain Assessment  Pain Scale CPOT  Critical Care Pain Observation Tool (CPOT)  Facial Expression 0  Body Movements 0  Muscle Tension 0  Compliance with ventilator (intubated pts.) 0  Vocalization (extubated pts.) N/A  CPOT Total 0  Dialysis Weight  Weight 52.1 kg (114 lb 13.8 oz)  Type of Weight Post-Dialysis  Post-Hemodialysis Assessment  Rinseback Volume (mL) 250 mL  Dialyzer Clearance Lightly streaked  Duration of HD Treatment -hour(s) 3 hour(s)  Hemodialysis Intake (mL) 500 mL  UF Total -Machine (mL) 1003 mL  Net UF (mL) 503 mL  Tolerated HD Treatment Yes  AVG/AVF Arterial Site Held (minutes) 10 minutes  AVG/AVF Venous Site Held (minutes) 10 minutes  Fistula / Graft Right Forearm Arteriovenous fistula  Placement Date/Time: 10/19/17 0850   Placed prior to admission: No  Orientation: Right  Access Location: Forearm  Access Type: Arteriovenous fistula  Site Condition No complications  Fistula / Graft Assessment Thrill;Present;Bruit  Status Deaccessed  Drainage Description None

## 2018-04-16 NOTE — Progress Notes (Signed)
Pt BP 80/54. Levophed titrated up to 8 mcg/min. Will continue to monitor.

## 2018-04-16 NOTE — Progress Notes (Addendum)
Heart rate up to 160s and sustained at greater than 115. PRN Metoprolol 5 mgs IVP given.

## 2018-04-17 ENCOUNTER — Inpatient Hospital Stay: Payer: Medicare Other

## 2018-04-17 LAB — CBC
HCT: 24 % — ABNORMAL LOW (ref 35.0–47.0)
Hemoglobin: 7.8 g/dL — ABNORMAL LOW (ref 12.0–16.0)
MCH: 28.6 pg (ref 26.0–34.0)
MCHC: 32.4 g/dL (ref 32.0–36.0)
MCV: 88.3 fL (ref 80.0–100.0)
Platelets: 342 K/uL (ref 150–440)
RBC: 2.72 MIL/uL — ABNORMAL LOW (ref 3.80–5.20)
RDW: 17.3 % — ABNORMAL HIGH (ref 11.5–14.5)
WBC: 15.8 K/uL — ABNORMAL HIGH (ref 3.6–11.0)

## 2018-04-17 LAB — GLUCOSE, CAPILLARY
GLUCOSE-CAPILLARY: 118 mg/dL — AB (ref 70–99)
GLUCOSE-CAPILLARY: 100 mg/dL — AB (ref 70–99)
GLUCOSE-CAPILLARY: 94 mg/dL (ref 70–99)
Glucose-Capillary: 114 mg/dL — ABNORMAL HIGH (ref 70–99)
Glucose-Capillary: 121 mg/dL — ABNORMAL HIGH (ref 70–99)
Glucose-Capillary: 93 mg/dL (ref 70–99)
Glucose-Capillary: 97 mg/dL (ref 70–99)

## 2018-04-17 MED ORDER — MIDAZOLAM HCL 2 MG/2ML IJ SOLN
1.0000 mg | INTRAMUSCULAR | Status: DC | PRN
  Administered 2018-04-17: 1 mg via INTRAVENOUS
  Filled 2018-04-17: qty 2

## 2018-04-17 MED ORDER — MIDAZOLAM HCL 2 MG/2ML IJ SOLN
1.0000 mg | INTRAMUSCULAR | Status: DC | PRN
  Administered 2018-04-18 (×3): 1 mg via INTRAVENOUS
  Filled 2018-04-17 (×3): qty 2

## 2018-04-17 NOTE — Progress Notes (Signed)
Daily Progress Note   Patient Name: Ann Cunningham       Date: 04/17/2018 DOB: 1941-08-17  Age: 77 y.o. MRN#: [id number] Attending Physician: Gorden Harms, MD Primary Care Physician: [place] Date: 04/03/2018  Reason for Consultation/Follow-up: Establishing goals of care  Subjective: Patient does not respond to me today, sedation increased.   Length of Stay: 7  Current Medications: Scheduled Meds:  . brinzolamide  1 drop Both Eyes TID   And  . brimonidine  1 drop Both Eyes TID  . chlorhexidine gluconate (MEDLINE KIT)  15 mL Mouth Rinse BID  . Chlorhexidine Gluconate Cloth  6 each Topical Q0600  . famotidine  20 mg Per Tube Daily  . latanoprost  1 drop Both Eyes QHS  . levothyroxine  25 mcg Intravenous Daily  . mouth rinse  15 mL Mouth Rinse 10 times per day  . metoprolol tartrate  25 mg Per Tube BID  . sodium chloride flush  10-40 mL Intracatheter Q12H    Continuous Infusions: . feeding supplement (VITAL AF 1.2 CAL) 1,000 mL (04/16/18 1751)  . fentaNYL infusion INTRAVENOUS 300 mcg/hr (04/17/18 0836)  . phenylephrine (NEO-SYNEPHRINE) Adult infusion 120 mcg/min (04/17/18 0947)    PRN Meds: acetaminophen **OR** acetaminophen, docusate, ipratropium-albuterol, metoprolol tartrate, [DISCONTINUED] ondansetron **OR** ondansetron (ZOFRAN) IV, pentafluoroprop-tetrafluoroeth, pentafluoroprop-tetrafluoroeth, sodium chloride flush  Physical Exam    Constitutional: She has a sickly appearance. No distress. She is sedated and intubated. Frail. HENT:  Head: Normocephalic and atraumatic.  Cardiovascular: Regular rhythm and rate.  Pulmonary/Chest: Breath sounds normal. She is intubated.  Abdominal: Soft. Bowel sounds are normal.  Neurological: Unable to assess  orientation.  Skin: Skin is warm and dry.   Vital Signs: BP (!) 99/52   Pulse 97   Temp 99.6 F (37.6 C) (Oral)   Resp 17   Ht _0  (1.575 m)   Wt 114 lb 13.8 oz (52.1 kg)   SpO2 94%   BMI 21.01 kg/m  SpO2: SpO2: 94 % O2 Device: O2 Device: Ventilator O2 Flow Rate: O2 Flow Rate (L/min): 3 L/min  Intake/output summary:   Intake/Output Summary (Last 24 hours) at 04/17/2018 1029 Last data filed at 04/17/2018 0815 Gross per 24 hour  Intake 2690.92 ml  Output 1046 ml  Net 1644.92 ml   LBM: Last BM Date: 04/13/18 Baseline Weight: Weight: 125 lb (56.7 kg) Most recent weight: Weight: 114 lb 13.8 oz (52.1 kg)       Palliative Assessment/Data: PPS 30%    Flowsheet Rows     Most Recent Value  Intake Tab  Referral Department  Nephrology  Unit at Time of Referral  ICU  Palliative Care Primary Diagnosis  Nephrology  Date Notified  04/11/18  Palliative Care Type  New Palliative care  Reason for referral  Clarify Goals of Care  Date of Admission  04/10/2018  Date first seen by Palliative Care  04/12/18  # of days Palliative referral response time  1 Day(s)  # of days IP prior to Palliative referral  2  Clinical Assessment  Psychosocial & Spiritual Assessment  Palliative Care Outcomes  Patient/Family meeting held?  Yes  Who was at the meeting?  niece and  nephew, other niece via speaker phone  Palliative Care Outcomes  Clarified goals of care, Counseled regarding hospice, Provided psychosocial or spiritual support, Changed CPR status      Patient Active Problem List   Diagnosis Date Noted  . Palliative care by specialist   . Goals of care, counseling/discussion   . Bilateral pleural effusion 04/10/2018  . Hypothyroidism 04/10/2018  . Acute on chronic combined systolic and diastolic CHF (congestive heart failure) ([place]) 04/10/2018  . Acute respiratory failure ([place]) 04/10/2018  . Hyperkalemia 02/12/2018  . Hypotension 01/10/2018  . Protein-calorie malnutrition, severe  10/20/2017  . Encephalopathy 10/19/2017  . ESRD on dialysis ([place]) 09/12/2017  . Essential hypertension 09/12/2017  . Failure to thrive in adult 08/17/2017  . Dyspnea 08/14/2017    Palliative Care Assessment & Plan   HPI: 77 y.o. female  with past medical history of ESRD on HD, cardiomyopathy (EF 30-35%), chronic pleural effusions, HTN, glaucoma, and blindness  admitted on 03/28/2018 with shortness of breath, abdominal pain, and missed HD appts. Patient found to have hypotension and shock. On 7/17 patient had hypoxia post thoracentesis and patient was intubated, had central line and chest tube placed. Per pulmonology, patient may have trapped lung and thoracentesis resulted in fluid shift and worsening respiratory failure. Patient has failed SBT mornings of 7/18 and 7/19. Also found to have enlarged gallbladder on CT - plan for HIDA after extubation. Noted that patient has had 4 admissions in last 6 months - appear to be d/t noncompliance with HD. PMT consulted by nephrology.   Assessment: 3rd family meeting: Met with patient's niece, [name], and [place] husband again. This time patient's other niece, Olegario Shearer, joined [place] along with patient's significant other, Herbie Baltimore. When we started the meeting Herbie Baltimore asked me if this meeting was to talk about Ms. Furber going home. Thoroughly discussed patient's clinical course with Herbie Baltimore and updated other family members. All questions addressed.  During this meeting, the family indicates that they would like to continue aggressive care - this is different from my other meetings with them. When asked for clarification about why their goals have changed they share with me that they spoke with their pastor regarding the patient's care and the pastor informed them that they were "making decisions too quickly". Again, we discussed patient's dependence on ventilator, failure to wean, ESRD, and heart failure. I also brought up what they had shared with me in previous meetings  about patient's poor quality of life baseline - essentially bedbound, multiple statements about wanting to die, and noncompliance with HD.   [name] then shares that she feels very overwhelmed making this decision and she feels as though the rest of the family is depending on her to make the decision. We discussed that it does not have to be her decision - she is not HCPOA - and the family available can make the decision together.  After continued discussion, all of patient's family members agree patient would not want to live like this. They agree she would not have wanted to be on the ventilator and wanted to stop HD. Discussed options moving forward including focusing on comfort and freeing Ms. Kooi from the ventilator. Family agrees that this is most reasonable plan of care moving forward. However, they ask for more time to pray and to have other family members visit Ms. Javier Glazier.  They ask multiple questions about what will happen when Ms. Dempsey is freed from the ventilator. We discussed scenarios.   They tell me they can all  be here Friday at 1pm and ask that we wait to shift to comfort care until then.   Recommendations/Plan:  Plan to switch to comfort care Friday at 1 pm  Multiple questions and concerns addressed during meeting today  Family feeling overwhelmed with decision making for Ms. Rowand  PMT will continue to support and update family  Code Status:  DNR  Prognosis:   < 2 weeks d/t respiratory failure, renal failure and no more plans for HD  Discharge Planning:  To Be Determined - hospital death?  Care plan was discussed with Dr. Alva Garnet, bedside RN, patient's family  Thank you for allowing the Palliative Medicine Team to assist in the care of this patient.   Time In: 1400 Time Out: 1600 Total Time 120 minutes Prolonged Time Billed  yes      Greater than 50%  of this time was spent counseling and coordinating care related to the above assessment and plan.  Juel Burrow, DNP, [facility] Palliative Medicine Team Team Phone # [phone]  Pager [phone]

## 2018-04-17 NOTE — Progress Notes (Signed)
[facility] - [place] at [facility]   PATIENT NAME: Ann CoolerWilma Cunningham    MR#:  [id number]  DATE OF BIRTH:  Apr 29, 1941  SUBJECTIVE:  CHIEF COMPLAINT:   Chief Complaint  Patient presents with  . Shortness of Breath  And discussion with intensivist-for terminal extubation when family is available  REVIEW OF SYSTEMS:  CONSTITUTIONAL: No fever, fatigue or weakness.  EYES: No blurred or double vision.  EARS, NOSE, AND THROAT: No tinnitus or ear pain.  RESPIRATORY: No cough, shortness of breath, wheezing or hemoptysis.  CARDIOVASCULAR: No chest pain, orthopnea, edema.  GASTROINTESTINAL: No nausea, vomiting, diarrhea or abdominal pain.  GENITOURINARY: No dysuria, hematuria.  ENDOCRINE: No polyuria, nocturia,  HEMATOLOGY: No anemia, easy bruising or bleeding SKIN: No rash or lesion. MUSCULOSKELETAL: No joint pain or arthritis.   NEUROLOGIC: No tingling, numbness, weakness.  PSYCHIATRY: No anxiety or depression.   ROS  DRUG ALLERGIES:   Allergies  Allergen Reactions  . Penicillins Other (See Comments)    Has patient had a PCN reaction causing immediate rash, facial/tongue/throat swelling, SOB or lightheadedness with hypotension: Unknown Has patient had a PCN reaction causing severe rash involving mucus membranes or skin necrosis: Unknown Has patient had a PCN reaction that required hospitalization: Unknown Has patient had a PCN reaction occurring within the last 10 years: Unknown If all of the above answers are "NO", then may proceed with Cephalosporin use.     VITALS:  Blood pressure (!) 87/49, pulse 89, temperature 98.2 F (36.8 C), temperature source Oral, resp. rate 17, height 5\' 2"  (1.575 m), weight 52.1 kg (114 lb 13.8 oz), SpO2 96 %.  PHYSICAL EXAMINATION:  GENERAL:  77 y.o.-year-old patient lying in the bed with no acute distress.  EYES: Pupils equal, round, reactive to light and accommodation. No scleral icterus. Extraocular muscles intact.  HEENT: Head  atraumatic, normocephalic. Oropharynx and nasopharynx clear.  NECK:  Supple, no jugular venous distention. No thyroid enlargement, no tenderness.  LUNGS: Normal breath sounds bilaterally, no wheezing, rales,rhonchi or crepitation. No use of accessory muscles of respiration.  CARDIOVASCULAR: S1, S2 normal. No murmurs, rubs, or gallops.  ABDOMEN: Soft, nontender, nondistended. Bowel sounds present. No organomegaly or mass.  EXTREMITIES: No pedal edema, cyanosis, or clubbing.  NEUROLOGIC: Cranial nerves II through XII are intact. Muscle strength 5/5 in all extremities. Sensation intact. Gait not checked.  PSYCHIATRIC: The patient is alert and oriented x 3.  SKIN: No obvious rash, lesion, or ulcer.   Physical Exam LABORATORY PANEL:   CBC Recent Labs  Lab 04/17/18 0604  WBC 15.8*  HGB 7.8*  HCT 24.0*  PLT 342   ------------------------------------------------------------------------------------------------------------------  Chemistries  Recent Labs  Lab 04/13/18 0608 04/14/18 0424  04/16/18 0320  NA 140 137   < > 136  K 4.2 3.8   < > 4.8  CL 100 99   < > 97*  CO2 31 30   < > 31  GLUCOSE 111* 125*   < > 103*  BUN 47* 36*   < > 69*  CREATININE 2.59* 1.88*   < > 3.04*  CALCIUM 7.7* 7.6*   < > 8.2*  MG 2.2 1.7  --   --   AST 59*  --   --   --   ALT 42  --   --   --   ALKPHOS 120  --   --   --   BILITOT 0.6  --   --   --    < > =  values in this interval not displayed.   ------------------------------------------------------------------------------------------------------------------  Cardiac Enzymes Recent Labs  Lab 04/10/18 1827 04/11/18 0046  TROPONINI 0.07* 0.07*   ------------------------------------------------------------------------------------------------------------------  RADIOLOGY:  Dg Chest Port 1 View  Result Date: 04/17/2018 CLINICAL DATA:  Respiratory failure EXAM: PORTABLE CHEST 1 VIEW COMPARISON:  04/16/2018 FINDINGS: Right chest tube, endotracheal  tube, central line and NG tube remain in place, unchanged. Small lateral right pneumothorax again noted, stable. Small bilateral pleural effusions and bilateral perihilar and lower lobe opacities are similar prior study. IMPRESSION: No significant change since prior study. Continued small lateral right pneumothorax. Bilateral perihilar and lower lobe opacities and small effusions. Electronically Signed   By: Charlett Nose M.D.   On: 04/17/2018 07:14   Dg Chest Port 1 View  Result Date: 04/16/2018 CLINICAL DATA:  Respiratory failure EXAM: PORTABLE CHEST 1 VIEW COMPARISON:  04/14/2018, 04/13/2018, 04/12/2018, 04/15/2018 FINDINGS: Endotracheal tube tip is about 3.4 cm superior to the carina. Esophageal tube tip is below the diaphragm and in the left upper quadrant. Right-sided central venous catheter tip overlies the cavoatrial region. Right-sided chest tube remains in place. No change in small right lateral basal pneumothorax. Small bilateral pleural effusions. Dense consolidation at the left base. Stable cardiomegaly. IMPRESSION: 1. Support lines and tubes as above. No significant interval change in small right lateral and basilar pneumothorax 2. Cardiomegaly with vascular congestion and continued pleural effusions. 3. No change in left greater than right bibasilar airspace disease which may reflect atelectasis or pneumonia Electronically Signed   By: Jasmine Pang M.D.   On: 04/16/2018 03:57    ASSESSMENT AND PLAN:  Patient is a 77 year old white female end-stage renal disease  1. Acute respiratory failure status post thoracentesis No improvement Patient had pneumothorax now with chest tube inplace Discussed with intensivist-for terminal extubation when the rest the family is available later today with plans for palliative/hospice going forward   2. Bilateralexudativepleural effusion Plan of care as stated above  3. End-stage renal disease HDPer nephrology on tomorrow  Plan of care as  stated above  4. Hypothyroidism Plan of care as stated above  5. History of CHF Plan of care as stated above   plan for terminal extubation when rest of family arrived later today, palliative care following  All the records are reviewed and case discussed with Care Management/Social Workerr. Management plans discussed with the patient, family and they are in agreement.  CODE STATUS: dnr  TOTAL TIME TAKING CARE OF THIS PATIENT: 35 minutes.     POSSIBLE D/C IN 3 DAYS, DEPENDING ON CLINICAL CONDITION.   Evelena Asa Calliope Delangel M.D on 04/17/2018   Between 7am to 6pm - Pager - [phone]  After 6pm go to [email or web address] - Social research officer, government  [facility]  Office  [phone]  CC: Primary care physician; Center, [facility]  Note: This dictation was prepared with Dragon dictation along with smaller phrase technology. Any transcriptional errors that result from this process are unintentional.

## 2018-04-17 NOTE — Clinical Social Work Note (Signed)
Patient is a resident at [facility] and will return there when medically ready. [place] SpanielMonica Lassie Demorest MSW,LCSW [phone]

## 2018-04-17 NOTE — Progress Notes (Signed)
[facility], [place] 04/17/18  Subjective:   Sedated with fentanyl Remains on ventilator support, phenylephrine  Tachycardic, irregular with A Fib/RVR in 130's NG tube feeds 45 cc/hr HD yesterday. 1000 cc removed,    Objective:  Vital signs in last 24 hours:  Temp:  [98.1 F (36.7 C)-99.7 F (37.6 C)] 99.6 F (37.6 C) (07/24 0815) Pulse Rate:  [88-141] 105 (07/24 0815) Resp:  [15-33] 19 (07/24 0815) BP: (73-141)/(48-81) 121/72 (07/24 0815) SpO2:  [92 %-98 %] 94 % (07/24 0815) FiO2 (%):  [30 %] 30 % (07/24 0815) Weight:  [52.1 kg (114 lb 13.8 oz)] 52.1 kg (114 lb 13.8 oz) (07/23 1030)  Weight change: 0.1 kg (3.5 oz) Filed Weights   04/16/18 0322 04/16/18 0715 04/16/18 1030  Weight: 52.4 kg (115 lb 8.3 oz) 52.5 kg (115 lb 11.9 oz) 52.1 kg (114 lb 13.8 oz)    Intake/Output:    Intake/Output Summary (Last 24 hours) at 04/17/2018 0852 Last data filed at 04/17/2018 0815 Gross per 24 hour  Intake 3006.84 ml  Output 1046 ml  Net 1960.84 ml     Physical Exam: General: Critically ill appearing  HEENT ETT in place  Pulm/lungs Vent assisted, coarse breath sounds b/l, rt chest tube  CVS/Heart Tachycardic, irregular  Abdomen:  soft  Extremities: Trace edema  Neurologic: sedated  Skin: warm  Access: Rt forearm AVF       Basic Metabolic Panel:  Recent Labs  Lab 04/10/18 1229 04/11/18 0445 04/12/18 0405 04/13/18 0608 04/14/18 0424 04/15/18 0606 04/16/18 0320  NA 138 142 142 140 137 138 136  K 4.7 4.9 4.5 4.2 3.8 4.4 4.8  CL 102 105 104 100 99 97* 97*  CO2 28 27 29 31 30 30 31   GLUCOSE 225* 143* 108* 111* 125* 117* 103*  BUN 74* 77* 53* 47* 36* 55* 69*  CREATININE 4.12* 4.21* 3.24* 2.59* 1.88* 2.60* 3.04*  CALCIUM 8.2* 8.5* 8.2* 7.7* 7.6* 7.9* 8.2*  MG 2.3 2.3 1.9 2.2 1.7  --   --   PHOS  --  5.4* 4.1 2.6 1.7*  1.7* 4.0  --      CBC: Recent Labs  Lab 04/11/18 0445 04/12/18 0405 04/13/18 0608 04/14/18 0424 04/15/18 0606  04/16/18 0320 04/17/18 0604  WBC 10.0 12.0* 12.3* 13.6* 12.9* 18.0* 15.8*  NEUTROABS 8.9* 10.4*  --  11.8* 10.2*  --   --   HGB 9.6* 8.9* 8.3* 8.2* 8.1* 8.0* 7.8*  HCT 29.4* 27.6* 25.9* 25.4* 25.3* 25.0* 24.0*  MCV 87.3 87.8 86.9 87.2 87.4 87.4 88.3  PLT 355 261 236 247 315 341 342      Lab Results  Component Value Date   HEPBSAG Negative 08/15/2017   HEPBSAB Non Reactive 08/15/2017   HEPBIGM Negative 08/15/2017      Microbiology:  Recent Results (from the past 240 hour(s))  MRSA PCR Screening     Status: None   Collection Time: 04/10/18  3:02 AM  Result Value Ref Range Status   MRSA by PCR NEGATIVE NEGATIVE Final    Comment:        The GeneXpert MRSA Assay (FDA approved for NASAL specimens only), is one component of a comprehensive MRSA colonization surveillance program. It is not intended to diagnose MRSA infection nor to guide or monitor treatment for MRSA infections. Performed at [facility], [place], [place] [address]   CULTURE, BLOOD (ROUTINE X 2) w Reflex to ID Panel  Status: None   Collection Time: 04/10/18 12:29 PM  Result Value Ref Range Status   Specimen Description BLOOD L HAND  Final   Special Requests   Final    BOTTLES DRAWN AEROBIC AND ANAEROBIC Blood Culture adequate volume   Culture   Final    NO GROWTH 5 DAYS Performed at [facility], [address], [place], [place] [address]    Report Status 04/15/2018 FINAL  Final  Body fluid culture with gram stain     Status: None   Collection Time: 04/10/18 12:40 PM  Result Value Ref Range Status   Specimen Description   Final    PLEURAL Performed at [facility], [address] [place], [place] [address]    Special Requests   Final    PLEURAL Performed at [facility], [address] [place], [place] [address]    Gram Stain   Final    ABUNDANT WBC PRESENT, PREDOMINANTLY MONONUCLEAR NO ORGANISMS SEEN    Culture   Final    NO  GROWTH 3 DAYS Performed at [facility], [place] [address], [place], [place] [address]    Report Status 04/13/2018 FINAL  Final  CULTURE, BLOOD (ROUTINE X 2) w Reflex to ID Panel     Status: None   Collection Time: 04/10/18  3:09 PM  Result Value Ref Range Status   Specimen Description BLOOD LT HAND  Final   Special Requests   Final    BOTTLES DRAWN AEROBIC AND ANAEROBIC Blood Culture adequate volume   Culture   Final    NO GROWTH 5 DAYS Performed at [facility], [address], [place], [place] [address]    Report Status 04/15/2018 FINAL  Final    Coagulation Studies: No results for input(s): LABPROT, INR in the last 72 hours.  Urinalysis: No results for input(s): COLORURINE, LABSPEC, PHURINE, GLUCOSEU, HGBUR, BILIRUBINUR, KETONESUR, PROTEINUR, UROBILINOGEN, NITRITE, LEUKOCYTESUR in the last 72 hours.  Invalid input(s): APPERANCEUR    Imaging: Dg Chest Port 1 View  Result Date: 04/17/2018 CLINICAL DATA:  Respiratory failure EXAM: PORTABLE CHEST 1 VIEW COMPARISON:  04/16/2018 FINDINGS: Right chest tube, endotracheal tube, central line and NG tube remain in place, unchanged. Small lateral right pneumothorax again noted, stable. Small bilateral pleural effusions and bilateral perihilar and lower lobe opacities are similar prior study. IMPRESSION: No significant change since prior study. Continued small lateral right pneumothorax. Bilateral perihilar and lower lobe opacities and small effusions. Electronically Signed   By: Rolm Baptise M.D.   On: 04/17/2018 07:14   Dg Chest Port 1 View  Result Date: 04/16/2018 CLINICAL DATA:  Respiratory failure EXAM: PORTABLE CHEST 1 VIEW COMPARISON:  04/14/2018, 04/13/2018, 04/12/2018, 04/15/2018 FINDINGS: Endotracheal tube tip is about 3.4 cm superior to the carina. Esophageal tube tip is below the diaphragm and in the left upper quadrant. Right-sided central venous catheter tip overlies the cavoatrial region. Right-sided chest tube  remains in place. No change in small right lateral basal pneumothorax. Small bilateral pleural effusions. Dense consolidation at the left base. Stable cardiomegaly. IMPRESSION: 1. Support lines and tubes as above. No significant interval change in small right lateral and basilar pneumothorax 2. Cardiomegaly with vascular congestion and continued pleural effusions. 3. No change in left greater than right bibasilar airspace disease which may reflect atelectasis or pneumonia Electronically Signed   By: Donavan Foil M.D.   On: 04/16/2018 03:57     Medications:   . feeding supplement (VITAL AF 1.2 CAL)  1,000 mL (04/16/18 1751)  . fentaNYL infusion INTRAVENOUS 300 mcg/hr (04/17/18 0836)  . phenylephrine (NEO-SYNEPHRINE) Adult infusion 80 mcg/min (04/17/18 0803)   . brinzolamide  1 drop Both Eyes TID   And  . brimonidine  1 drop Both Eyes TID  . chlorhexidine gluconate (MEDLINE KIT)  15 mL Mouth Rinse BID  . Chlorhexidine Gluconate Cloth  6 each Topical Q0600  . famotidine  20 mg Per Tube Daily  . latanoprost  1 drop Both Eyes QHS  . levothyroxine  25 mcg Intravenous Daily  . mouth rinse  15 mL Mouth Rinse 10 times per day  . metoprolol tartrate  25 mg Per Tube BID  . sodium chloride flush  10-40 mL Intracatheter Q12H   acetaminophen **OR** acetaminophen, docusate, ipratropium-albuterol, metoprolol tartrate, [DISCONTINUED] ondansetron **OR** ondansetron (ZOFRAN) IV, pentafluoroprop-tetrafluoroeth, pentafluoroprop-tetrafluoroeth, sodium chloride flush  Assessment/ Plan:  76 y.o.African [place] female with end stage renal disease on hemodialysis TTS, hypertension, tobacco abuse, glaucoma with blindness, hypothyroidism, congestive heart failure who is admitted to [facility] on 03/26/2018  Patient with large volume right sided thoracentesis on 7/17 by radiology, however patient developed a pneumothorax and required mechanical ventilation and intubation. Chest tube placed  [facility] TTS [facility]  EDW 51.5kg Right AVF  1. ESRD - TTS schedule, 17 G needles, BFR 300  2. Chronic hypotension - midodrine as outpatient - currently requiring neosynephrine  3. Secondary HPTH - Phos 4.0 (7/22)  4. Anemia of CKD - EPO with HD  5. Acute respiratory failure - Rt Pneumothorax with Rt  Chest tube - vent assisted  6. Palliative care discussions ongoing Family is considering comfort measures   LOS: [place] 7/24/20198:52 AM  [place], [place]  Note: This note was prepared with Dragon dictation. Any transcription errors are unintentional

## 2018-04-17 NOTE — Progress Notes (Signed)
CRITICAL CARE NOTE  CC  follow up respiratory failure secondary to bilateral pleural effusion and right sided pneumothorax  SUBJECTIVE Patient remains critically ill Prognosis is guarded  77 year old female with ESRD, respiratory failure, and chronic bilateral pleural effusions. Patient remains intubated and sedated. HD yesterday. Patient appears very uncomfortable today. She is able to follow verbal commands to move her extremities, however she will not open her eyes for me.   SIGNIFICANT EVENTS  Persistently hypotensive after hemodialysis. Neo-synephrine titrated up and vasopressin started. BP is much better this morning.   BP 124/62   Pulse (!) 115   Temp 98.1 F (36.7 C) (Oral)   Resp (!) 33   Ht _0  (1.575 m)   Wt 52.1 kg (114 lb 13.8 oz)   SpO2 95%   BMI 21.01 kg/m    REVIEW OF SYSTEMS  PATIENT IS UNABLE TO PROVIDE COMPLETE REVIEW OF SYSTEM S DUE TO SEVERE CRITICAL ILLNESS AND ENCEPHALOPATHY   PHYSICAL EXAMINATION:  GENERAL:critically ill appearing, +resp distress HEAD: Normocephalic, atraumatic.  EYES: Pupils equal, round, reactive to light.  No scleral icterus.  MOUTH: Moist mucosal membrane. NECK: Supple. No thyromegaly. No nodules. No JVD.  PULMONARY: +diffuse rhonchi, +wheezing CARDIOVASCULAR: S1 and S2. Tachycardic and regular rhythm. No murmurs, rubs, or gallops.  GASTROINTESTINAL: Soft, nontender, -distended. No masses. Positive bowel sounds. No hepatosplenomegaly.  MUSCULOSKELETAL: No swelling, clubbing, or edema.  NEUROLOGIC: able to follow verbal commands SKIN: right arm skin breakdown, left forearm ulceration, hyperpigmentation on anterior surface of both feet/lower leg  ASSESSMENT AND PLAN SYNOPSIS  77 year old female with ESRD, respiratory failure, and chronic bilateral pleural effusions. Patient continues to be intubated and sedated. Palliative care family meeting was yesterday with family likely wanting to initiate comfort care. They will be  meeting again today at 2pm for the final decision.   Patient is status DO NOT RESUSCITATE.   Severe Hypoxic and Hypercapnic Respiratory Failure -continue Full MV support -continue Bronchodilator Therapy -Wean Fio2 and PEEP as tolerated -will perform SBt when respiratory parameters are met -CXR: unchanged from previous days, persistent right basilar pneumothorax with bilateral basilar opacities -Chest tube on water seal   ESRD: -HD yesterday -appreciate continued nephrology involvement   NEUROLOGY - intubated and sedated; fentanyl - minimal sedation to achieve a RASS goal: -1 -patient following verbal commands   CARDIAC -ICU monitoring -continue metoprolol scheduled and PRN for sinus tachycardia -continue neo-synephrine and d/c vasopressin  GI: -RUQ [place] 7/17: distention of gallbladder with possible underlying inflammation. Need HIDA scan for further evaluation -lipase and liver function normal. Albumin low  ID -WBC 18.0-->15.8 -patient afebrile -no growth from blood and pleural fluid cultures.    DVT: SCDs GI: pepcid  TRANSFUSIONS AS NEEDED MONITOR FSBS ASSESS the need for LABS as needed   Critical Care Time devoted to patient care services described in this note is 24 minutes.   Overall, patient is critically ill, prognosis is guarded.  Patient with Multiorgan failure and at high risk for cardiac arrest and death.

## 2018-04-17 NOTE — Progress Notes (Signed)
Remains intubated and sedated on fentanyl infusion. RASS -1, -2.  Not weanable  Vitals:   04/17/18 1130 04/17/18 1145 04/17/18 1200 04/17/18 1245  BP: (!) 78/60 (!) 96/59 (!) 84/49 (!) 87/49  Pulse: 94 100 91 89  Resp: 19 (!) 21 16 17   Temp:  98.2 F (36.8 C)    TempSrc:  Oral    SpO2: 92% 92% 95% 96%  Weight:      Height:       Vent Mode: PRVC FiO2 (%):  [30 %] 30 % Set Rate:  [16 bmp] 16 bmp Vt Set:  [400 mL] 400 mL PEEP:  [5 cmH20] 5 cmH20  Intubated, sedated Very frail NCAT, sclerae white No JVD Bilateral rhonchi Regular, no M Abdomen soft, NABS, NT Extremities cool, no edema No focal neurologic deficits  BMP Latest Ref Rng & Units 04/16/2018 04/15/2018 04/14/2018  Glucose 70 - 99 mg/dL 161(W103(H) 960(A117(H) 540(J125(H)  BUN 8 - 23 mg/dL 81(X69(H) 91(Y55(H) 78(G36(H)  Creatinine 0.44 - 1.00 mg/dL 9.56(O3.04(H) 1.30(Q2.60(H) 6.57(Q1.88(H)  Sodium 135 - 145 mmol/L 136 138 137  Potassium 3.5 - 5.1 mmol/L 4.8 4.4 3.8  Chloride 98 - 111 mmol/L 97(L) 97(L) 99  CO2 22 - 32 mmol/L 31 30 30   Calcium 8.9 - 10.3 mg/dL 8.2(L) 7.9(L) 7.6(L)   CBC Latest Ref Rng & Units 04/17/2018 04/16/2018 04/15/2018  WBC 3.6 - 11.0 K/uL 15.8(H) 18.0(H) 12.9(H)  Hemoglobin 12.0 - 16.0 g/dL 7.8(L) 8.0(L) 8.1(L)  Hematocrit 35.0 - 47.0 % 24.0(L) 25.0(L) 25.3(L)  Platelets 150 - 440 K/uL 342 341 315   Chest tube: On waterseal, no air leak  CXR: NSC persistent right basilar pneumothorax, right basilar opacity,  LLL atelectasis  IMPRESSION: Acute on chronic respiratory failure with hypoxemia Chronic bilateral pleural effusions Right pneumothorax after thoracentesis.  No air leak.  Might be an ex vacuo PTX  Prolonged ventilator dependence.  Not weanable  Dilated cardiomyopathy Intermittent sinus tachycardia ESRD Adult failure to thrive.  SNF resident Severe protein-calorie malnutrition/cachexia  PLAN/REC: Cont full vent support - settings reviewed and/or adjusted Cont vent bundle Continue scheduled metoprolol Continue IV  metoprolol as needed to maintain HR <115/min Discussed with nephrology service.  No further HD planned Continue nutritional support  Palliative care team's assistance much appreciated.  Plan terminal extubation later today after all family members have arrived.  DVT px: SCDs SUP: enteral famotidine   Billy Fischeravid Macklyn Glandon, MD PCCM service Mobile [phone] Pager [phone] 04/17/2018 1:41 PM

## 2018-04-18 DIAGNOSIS — L8990 Pressure ulcer of unspecified site, unspecified stage: Secondary | ICD-10-CM

## 2018-04-18 DIAGNOSIS — Z978 Presence of other specified devices: Secondary | ICD-10-CM

## 2018-04-18 LAB — GLUCOSE, CAPILLARY
GLUCOSE-CAPILLARY: 84 mg/dL (ref 70–99)
GLUCOSE-CAPILLARY: 93 mg/dL (ref 70–99)
GLUCOSE-CAPILLARY: 88 mg/dL (ref 70–99)
Glucose-Capillary: 77 mg/dL (ref 70–99)

## 2018-04-18 MED ORDER — VITAL AF 1.2 CAL PO LIQD
1000.0000 mL | ORAL | Status: DC
  Administered 2018-04-18 (×2): 1000 mL

## 2018-04-18 MED ORDER — SODIUM CHLORIDE 0.9 % IV SOLN
0.0000 ug/min | INTRAVENOUS | Status: DC
  Administered 2018-04-18: 120 ug/min via INTRAVENOUS
  Administered 2018-04-19 (×2): 140 ug/min via INTRAVENOUS
  Filled 2018-04-18 (×2): qty 40
  Filled 2018-04-18: qty 4
  Filled 2018-04-18: qty 40
  Filled 2018-04-18: qty 4

## 2018-04-18 NOTE — Plan of Care (Signed)
  Problem: Nutrition: Goal: Adequate nutrition will be maintained Outcome: Progressing Note:  Pt has TF infusing at goal   Problem: Safety: Goal: Ability to remain free from injury will improve Outcome: Progressing   Problem: Clinical Measurements: Goal: Ability to maintain clinical measurements within normal limits will improve Outcome: Not Met (add Reason) Goal: Diagnostic test results will improve Outcome: Not Met (add Reason) Goal: Respiratory complications will improve Outcome: Not Met (add Reason)   Problem: Activity: Goal: Risk for activity intolerance will decrease Outcome: Not Met (add Reason)   Problem: Skin Integrity: Goal: Risk for impaired skin integrity will decrease Outcome: Not Met (add Reason) Note:  Sacral injury present

## 2018-04-18 NOTE — Progress Notes (Signed)
Pt opens eyes but doe snot follow any commands, turned and repositioned and pt began biting ET Tube and desalted to high 80s, prn medication given with good effect. Neo at 100cgs  And not to be titrated per MD, MAP above 65 at this time. Little drainange from chest tube. No family here so far this shift

## 2018-04-18 NOTE — Progress Notes (Signed)
Remains intubated and sedated on fentanyl infusion. RASS -3  Vitals:   04/18/18 0906 04/18/18 1000 04/18/18 1100 04/18/18 1215  BP: (!) 85/53 (!) 95/57 131/64 (!) 86/46  Pulse: 92 86 86 89  Resp:  20 (!) 21 16  Temp:   98.7 F (37.1 C)   TempSrc:      SpO2:    91%  Weight:      Height:       Vent Mode: PRVC FiO2 (%):  [30 %] 30 % Set Rate:  [16 bmp] 16 bmp Vt Set:  [400 mL] 400 mL PEEP:  [5 cmH20] 5 cmH20 Plateau Pressure:  [20 cmH20] 20 cmH20  Intubated, sedated Very frail NCAT, sclerae white No JVD Bilateral rhonchi Regular, no M Abdomen soft, NABS, NT Extremities cool, no edema No focal neurologic deficits  BMP Latest Ref Rng & Units 04/16/2018 04/15/2018 04/14/2018  Glucose 70 - 99 mg/dL 401(U103(H) 272(Z117(H) 366(Y125(H)  BUN 8 - 23 mg/dL 40(H69(H) 47(Q55(H) 25(Z36(H)  Creatinine 0.44 - 1.00 mg/dL 5.63(O3.04(H) 7.56(E2.60(H) 3.32(R1.88(H)  Sodium 135 - 145 mmol/L 136 138 137  Potassium 3.5 - 5.1 mmol/L 4.8 4.4 3.8  Chloride 98 - 111 mmol/L 97(L) 97(L) 99  CO2 22 - 32 mmol/L 31 30 30   Calcium 8.9 - 10.3 mg/dL 8.2(L) 7.9(L) 7.6(L)   CBC Latest Ref Rng & Units 04/17/2018 04/16/2018 04/15/2018  WBC 3.6 - 11.0 K/uL 15.8(H) 18.0(H) 12.9(H)  Hemoglobin 12.0 - 16.0 g/dL 7.8(L) 8.0(L) 8.1(L)  Hematocrit 35.0 - 47.0 % 24.0(L) 25.0(L) 25.3(L)  Platelets 150 - 440 K/uL 342 341 315   Chest tube: On waterseal, no air leak  CXR: NNF  IMPRESSION: Acute on chronic respiratory failure with hypoxemia Chronic bilateral pleural effusions Right pneumothorax after thoracentesis.  No air leak.  Might be an ex vacuo PTX  Prolonged ventilator dependence.  Not weanable  Dilated cardiomyopathy Intermittent sinus tachycardia ESRD Adult failure to thrive.  SNF resident Severe protein-calorie malnutrition/cachexia  PLAN/REC: Cont full vent support - settings reviewed and/or adjusted Cont vent bundle Continue scheduled metoprolol Continue IV metoprolol as needed to maintain HR <115/min Discussed with nephrology service.  No  further HD planned Continue nutritional support  She is now comfort care with plan for terminal extubation 07/26. I have stopped everything that is not required to keep her comfortable. We will keep phenylephrine @ a fixed dose, not to be titrated. No further HD.    Billy Fischeravid Simonds, MD PCCM service Mobile [phone] Pager [phone] 04/18/2018 3:30 PM

## 2018-04-18 NOTE — Progress Notes (Signed)
CRITICAL CARE NOTE  CC  follow up respiratory failure secondary to bilateral pleural effusion with right sided pneumothorax  SUBJECTIVE Patient remains critically ill Prognosis is guarded  77 year old female with ESRD, chronic bilateral pleural effusion, right sided pneumothorax. Patient continues to be intubated and sedated. Tachycardia has improved with metoprolol. Family meeting yesterday with palliative care revealed that family is planning for terminal extubation on Friday 7/26 at 1pm. Patient is unable to follow commands this morning.   SIGNIFICANT EVENTS  None overnight  BP (!) 97/52   Pulse 61   Temp 98.6 F (37 C) (Oral)   Resp 17   Ht 5\' 2"  (1.575 m)   Wt 52.1 kg (114 lb 13.8 oz)   SpO2 97%   BMI 21.01 kg/m    REVIEW OF SYSTEMS  PATIENT IS UNABLE TO PROVIDE COMPLETE REVIEW OF SYSTEM S DUE TO SEVERE CRITICAL ILLNESS AND ENCEPHALOPATHY   PHYSICAL EXAMINATION:  GENERAL:critically ill appearing, +resp distress HEAD: Normocephalic, atraumatic.  EYES: Pupils equal, round, reactive to light.  No scleral icterus.  MOUTH: Moist mucosal membrane. NECK: Supple. No thyromegaly. No nodules. No JVD.  PULMONARY: +diffuse bilateral rhonchi, +wheezing CARDIOVASCULAR: S1 and S2. Regular rate and rhythm. No murmurs, rubs, or gallops.  GASTROINTESTINAL: Soft, nontender, -distended. No masses. Positive bowel sounds. No hepatosplenomegaly.  MUSCULOSKELETAL: No swelling, clubbing, or edema.  NEUROLOGIC: obtunded, GCS<8 SKIN: left forearm ulceration, right forearm with skin breakdown. Hyperpigmentation of both anterior surfaces of feet  ASSESSMENT AND PLAN SYNOPSIS  77 year old female with ESRD, respiratory failure, and chronic bilateral pleural effusions. Patient continues to be intubated and sedated. Plan is for terminal extubation at 1pm on 7/26.  Patient is status DO NOT RESUSCITATE  Severe Hypoxic and Hypercapnic Respiratory Failure -continue Full MV support, unable to  wean -continue Bronchodilator Therapy -CXR: persistent right basilar pneumothorax with bilateral basilar opacities. -Chest tube on water seal   ESRD -HD 7/23, hemodialysis discontinued -appreciate nephrology continuing to follow the patient   NEUROLOGY - intubated and sedated: fentanyl - minimal sedation to achieve a RASS goal: -1 -patient unable to follow commands   CARDIAC ICU monitoring -continue metoprolol scheduled and PRN; currently NSR -continue neo-synephrine for pressure support; BP holding  ID -patient afebrile -no growth from blood and pleural fluid cultures -no indication for abx at this time  GI: -RUQ [place] 7/17; distention of gallbladder with possible underlying inflammation. -lipase and liver function normal. Albumin low -HIDA scan likely unnecessary as patient moves towards comfort care.   DVT: SCD GI: pepcid  TRANSFUSIONS AS NEEDED MONITOR FSBS ASSESS the need for LABS as needed   Critical Care Time devoted to patient care services described in this note is 26 minutes.   Overall, patient is critically ill, prognosis is guarded.  Patient with Multiorgan failure and at high risk for cardiac arrest and death.

## 2018-04-18 NOTE — Progress Notes (Signed)
Daily Progress Note   Patient Name: Ann Cunningham       Date: 04/18/2018 DOB: 25-Dec-1940  Age: 77 y.o. MRN#: [id number] Attending Physician: Gorden Harms, MD Primary Care Physician: [place] Date: 03/31/2018  Reason for Consultation/Follow-up: Establishing goals of care  Subjective: Patient does not respond to me today, remains on fentanyl drip and pressors.   Length of Stay: 8  Current Medications: Scheduled Meds:  . chlorhexidine gluconate (MEDLINE KIT)  15 mL Mouth Rinse BID  . Chlorhexidine Gluconate Cloth  6 each Topical Q0600  . famotidine  20 mg Per Tube Daily  . latanoprost  1 drop Both Eyes QHS  . levothyroxine  25 mcg Intravenous Daily  . mouth rinse  15 mL Mouth Rinse 10 times per day  . sodium chloride flush  10-40 mL Intracatheter Q12H    Continuous Infusions: . feeding supplement (VITAL AF 1.2 CAL) 1,000 mL (04/18/18 0916)  . fentaNYL infusion INTRAVENOUS 400 mcg/hr (04/18/18 1100)  . phenylephrine (NEO-SYNEPHRINE) Adult infusion 100 mcg/min (04/18/18 1100)    PRN Meds: acetaminophen **OR** acetaminophen, docusate, ipratropium-albuterol, metoprolol tartrate, midazolam, [DISCONTINUED] ondansetron **OR** ondansetron (ZOFRAN) IV, pentafluoroprop-tetrafluoroeth, pentafluoroprop-tetrafluoroeth, sodium chloride flush  Physical Exam    Constitutional: She has a sickly appearance. No distress. She is sedated and intubated. Frail. HENT:  Head: Normocephalic and atraumatic.  Cardiovascular: Regular rhythm and rate.  Pulmonary/Chest: Breath sounds normal. She is intubated.  Abdominal: Soft. Bowel sounds are normal.  Neurological: Unable to assess orientation.  Skin: Skin is warm and dry.   Vital Signs: BP 131/64   Pulse 86   Temp 98.7 F (37.1  C)   Resp (!) 21   Ht _0  (1.575 m)   Wt 114 lb 13.8 oz (52.1 kg)   SpO2 97%   BMI 21.01 kg/m  SpO2: SpO2: 97 % O2 Device: O2 Device: Ventilator O2 Flow Rate: O2 Flow Rate (L/min): 3 L/min  Intake/output summary:   Intake/Output Summary (Last 24 hours) at 04/18/2018 1302 Last data filed at 04/18/2018 1100 Gross per 24 hour  Intake 2450.37 ml  Output 0 ml  Net 2450.37 ml   LBM: Last BM Date: 04/13/18 Baseline Weight: Weight: 125 lb (56.7 kg) Most recent weight: Weight: 114 lb 13.8 oz (52.1 kg)       Palliative Assessment/Data: PPS 30%    Flowsheet Rows     Most Recent Value  Intake Tab  Referral Department  Nephrology  Unit at Time of Referral  ICU  Palliative Care Primary Diagnosis  Nephrology  Date Notified  04/11/18  Palliative Care Type  New Palliative care  Reason for referral  Clarify Goals of Care  Date of Admission  04/03/2018  Date first seen by Palliative Care  04/12/18  # of days Palliative referral response time  1 Day(s)  # of days IP prior to Palliative referral  2  Clinical Assessment  Psychosocial & Spiritual Assessment  Palliative Care Outcomes  Patient/Family meeting held?  Yes  Who was at the meeting?  niece and nephew, other niece via speaker phone  Palliative Care Outcomes  Clarified goals of care, Counseled regarding hospice, Provided psychosocial or spiritual support, Changed CPR status  Patient Active Problem List   Diagnosis Date Noted  . Pressure injury of skin 04/18/2018  . Palliative care by specialist   . Goals of care, counseling/discussion   . Bilateral pleural effusion 04/10/2018  . Hypothyroidism 04/10/2018  . Acute on chronic combined systolic and diastolic CHF (congestive heart failure) ([place]) 04/10/2018  . Acute respiratory failure ([place]) 04/10/2018  . Hyperkalemia 02/12/2018  . Hypotension 01/10/2018  . Protein-calorie malnutrition, severe 10/20/2017  . Encephalopathy 10/19/2017  . ESRD on dialysis ([place]) 09/12/2017    . Essential hypertension 09/12/2017  . Failure to thrive in adult 08/17/2017  . Dyspnea 08/14/2017    Palliative Care Assessment & Plan   HPI: 77 y.o. female  with past medical history of ESRD on HD, cardiomyopathy (EF 30-35%), chronic pleural effusions, HTN, glaucoma, and blindness  admitted on 04/14/2018 with shortness of breath, abdominal pain, and missed HD appts. Patient found to have hypotension and shock. On 7/17 patient had hypoxia post thoracentesis and patient was intubated, had central line and chest tube placed. Per pulmonology, patient may have trapped lung and thoracentesis resulted in fluid shift and worsening respiratory failure. Patient has failed SBT mornings of 7/18 and 7/19. Also found to have enlarged gallbladder on CT - plan for HIDA after extubation. Noted that patient has had 4 admissions in last 6 months - appear to be d/t noncompliance with HD. PMT consulted by nephrology.   Assessment: Follow up with [name] via telephone. Provided clinical update and answered questions. [name] asks me again "Is there anything else we can do?" Again we discussed patient's clinical course, dependence on ventilator, and overall failure to thrive.   Confirmed that we will proceed with terminal extubation tomorrow at 1 pm - however, she does sound hesitant. I would not be surprised if she asked that this be delayed tomorrow. I encouraged her to come see the patient today and speak with critical care doctor. She agrees to this.   Recommendations/Plan:  Plan to switch to comfort care Friday at 1 pm - family hesitant b/c pastor telling them they are moving too fast  Multiple questions and concerns addressed on multiple occasions  Family feeling overwhelmed with decision making for [name]  PMT will continue to support and update family  Code Status:  DNR  Prognosis:   < 2 weeks d/t respiratory failure, renal failure and no more plans for HD  Discharge Planning:  To Be Determined  - hospital death?  Care plan was discussed with [name], patient's niece  Thank you for allowing the Palliative Medicine Team to assist in the care of this patient.   Total Time 25 minutes Prolonged Time Billed  yes      Greater than 50%  of this time was spent counseling and coordinating care related to the above assessment and plan.  Juel Burrow, DNP, [facility] Palliative Medicine Team Team Phone # [phone]  Pager [phone]

## 2018-04-18 NOTE — Progress Notes (Signed)
[employer] -  at [facility]   PATIENT NAME: Dorthy CoolerWilma Hausen    MR#:  [id number]  DATE OF BIRTH:  25-Dec-1940  SUBJECTIVE:  CHIEF COMPLAINT:   Chief Complaint  Patient presents with  . Shortness of Breath  In discussion with intensivist, awaiting family meeting for withdrawal of care-tentatively plan for on tomorrow REVIEW OF SYSTEMS:  CONSTITUTIONAL: No fever, fatigue or weakness.  EYES: No blurred or double vision.  EARS, NOSE, AND THROAT: No tinnitus or ear pain.  RESPIRATORY: No cough, shortness of breath, wheezing or hemoptysis.  CARDIOVASCULAR: No chest pain, orthopnea, edema.  GASTROINTESTINAL: No nausea, vomiting, diarrhea or abdominal pain.  GENITOURINARY: No dysuria, hematuria.  ENDOCRINE: No polyuria, nocturia,  HEMATOLOGY: No anemia, easy bruising or bleeding SKIN: No rash or lesion. MUSCULOSKELETAL: No joint pain or arthritis.   NEUROLOGIC: No tingling, numbness, weakness.  PSYCHIATRY: No anxiety or depression.   ROS  DRUG ALLERGIES:   Allergies  Allergen Reactions  . Penicillins Other (See Comments)    Has patient had a PCN reaction causing immediate rash, facial/tongue/throat swelling, SOB or lightheadedness with hypotension: Unknown Has patient had a PCN reaction causing severe rash involving mucus membranes or skin necrosis: Unknown Has patient had a PCN reaction that required hospitalization: Unknown Has patient had a PCN reaction occurring within the last 10 years: Unknown If all of the above answers are "NO", then may proceed with Cephalosporin use.     VITALS:  Blood pressure 131/64, pulse 86, temperature 98.7 F (37.1 C), resp. rate (!) 21, height 5\' 2"  (1.575 m), weight 52.1 kg (114 lb 13.8 oz), SpO2 97 %.  PHYSICAL EXAMINATION:  GENERAL:  77 y.o.-year-old patient lying in the bed with no acute distress.  EYES: Pupils equal, round, reactive to light and accommodation. No scleral icterus. Extraocular muscles intact.  HEENT:  Head atraumatic, normocephalic. Oropharynx and nasopharynx clear.  NECK:  Supple, no jugular venous distention. No thyroid enlargement, no tenderness.  LUNGS: Normal breath sounds bilaterally, no wheezing, rales,rhonchi or crepitation. No use of accessory muscles of respiration.  CARDIOVASCULAR: S1, S2 normal. No murmurs, rubs, or gallops.  ABDOMEN: Soft, nontender, nondistended. Bowel sounds present. No organomegaly or mass.  EXTREMITIES: No pedal edema, cyanosis, or clubbing.  NEUROLOGIC: Cranial nerves II through XII are intact. Muscle strength 5/5 in all extremities. Sensation intact. Gait not checked.  PSYCHIATRIC: The patient is alert and oriented x 3.  SKIN: No obvious rash, lesion, or ulcer.   Physical Exam LABORATORY PANEL:   CBC Recent Labs  Lab 04/17/18 0604  WBC 15.8*  HGB 7.8*  HCT 24.0*  PLT 342   ------------------------------------------------------------------------------------------------------------------  Chemistries  Recent Labs  Lab 04/13/18 0608 04/14/18 0424  04/16/18 0320  NA 140 137   < > 136  K 4.2 3.8   < > 4.8  CL 100 99   < > 97*  CO2 31 30   < > 31  GLUCOSE 111* 125*   < > 103*  BUN 47* 36*   < > 69*  CREATININE 2.59* 1.88*   < > 3.04*  CALCIUM 7.7* 7.6*   < > 8.2*  MG 2.2 1.7  --   --   AST 59*  --   --   --   ALT 42  --   --   --   ALKPHOS 120  --   --   --   BILITOT 0.6  --   --   --    < > =  values in this interval not displayed.   ------------------------------------------------------------------------------------------------------------------  Cardiac Enzymes No results for input(s): TROPONINI in the last 168 hours. ------------------------------------------------------------------------------------------------------------------  RADIOLOGY:  Dg Chest Port 1 View  Result Date: 04/17/2018 CLINICAL DATA:  Respiratory failure EXAM: PORTABLE CHEST 1 VIEW COMPARISON:  04/16/2018 FINDINGS: Right chest tube, endotracheal tube, central  line and NG tube remain in place, unchanged. Small lateral right pneumothorax again noted, stable. Small bilateral pleural effusions and bilateral perihilar and lower lobe opacities are similar prior study. IMPRESSION: No significant change since prior study. Continued small lateral right pneumothorax. Bilateral perihilar and lower lobe opacities and small effusions. Electronically Signed   By: Charlett Nose M.D.   On: 04/17/2018 07:14    ASSESSMENT AND PLAN:  Patient is a 77 year old white female end-stage renal disease  1. Acute respiratory failure status post thoracentesis No improvement Patient had pneumothorax now with chest tube inplace Discussed with intensivist-for terminal extubation when the rest the family is available later today with plans for palliative/hospice going forward   2. Bilateralexudativepleural effusion Plan of care as stated above  3. End-stage renal disease HDPer nephrology on tomorrow  Plan of care as stated above  4. Hypothyroidism Plan of care as stated above  5. History of CHF Plan of care as stated above  Plan for terminal extubation on tomorrow with comfort care/hospice once family are all present and in agreement  All the records are reviewed and case discussed with Care Management/Social Workerr. Management plans discussed with the patient, family and they are in agreement.  CODE STATUS: dnr  TOTAL TIME TAKING CARE OF THIS PATIENT: 35 minutes.     POSSIBLE D/C IN 5 DAYS, DEPENDING ON CLINICAL CONDITION.   Evelena Asa Ismerai Bin M.D on 04/18/2018   Between 7am to 6pm - Pager - [phone]  After 6pm go to [email or web address] - Social research officer, government  [facility] [place] [facility]  Office  [phone]  CC: Primary care physician; Center, [facility]  Note: This dictation was prepared with Dragon dictation along with smaller phrase technology. Any transcriptional errors that result from this process are unintentional.

## 2018-04-19 DIAGNOSIS — Z515 Encounter for palliative care: Secondary | ICD-10-CM

## 2018-04-19 MED ORDER — LORAZEPAM 2 MG/ML IJ SOLN: 1.0000 mg | INTRAMUSCULAR | Status: DC | PRN

## 2018-04-19 MED ORDER — GLYCOPYRROLATE 0.2 MG/ML IJ SOLN
0.2000 mg | INTRAMUSCULAR | Status: DC | PRN
  Administered 2018-04-19: 0.2 mg via INTRAVENOUS
  Filled 2018-04-19: qty 1

## 2018-04-19 MED ORDER — LORAZEPAM 2 MG/ML IJ SOLN
1.0000 mg | Freq: Once | INTRAMUSCULAR | Status: AC
  Administered 2018-04-19: 1 mg via INTRAVENOUS
  Filled 2018-04-19: qty 1

## 2018-04-19 MED ORDER — ORAL CARE MOUTH RINSE: 15.0000 mL | Freq: Two times a day (BID) | OROMUCOSAL | Status: DC

## 2018-04-19 MED ORDER — SODIUM CHLORIDE 0.9 % IV SOLN
1.0000 mg/h | INTRAVENOUS | Status: DC
  Administered 2018-04-19: 1 mg/h via INTRAVENOUS
  Filled 2018-04-19: qty 2.5

## 2018-04-19 MED ORDER — HYDROMORPHONE BOLUS VIA INFUSION
1.0000 mg | INTRAVENOUS | Status: DC | PRN
  Administered 2018-04-19 (×2): 1 mg via INTRAVENOUS
  Filled 2018-04-19: qty 1

## 2018-04-25 NOTE — Plan of Care (Signed)
  Problem: Clinical Measurements: Goal: Will remain free from infection Outcome: Progressing   Problem: Nutrition: Goal: Adequate nutrition will be maintained Outcome: Progressing Note:  TF infusing    Problem: Safety: Goal: Ability to remain free from injury will improve Outcome: Progressing   Problem: Education: Goal: Knowledge of General Education information will improve Outcome: Not Met (add Reason)   Problem: Clinical Measurements: Goal: Respiratory complications will improve Outcome: Not Met (add Reason)   Problem: Activity: Goal: Risk for activity intolerance will decrease Outcome: Not Met (add Reason)   Problem: Elimination: Goal: Will not experience complications related to bowel motility Outcome: Not Met (add Reason) Goal: Will not experience complications related to urinary retention Outcome: Not Met (add Reason)   Problem: Skin Integrity: Goal: Risk for impaired skin integrity will decrease Outcome: Not Met (add Reason)

## 2018-04-25 NOTE — Progress Notes (Signed)
Patient passed away at 16:34.  Family at bedside.

## 2018-04-25 NOTE — Progress Notes (Signed)
Pt was suctioned prior to extubation for a small amount of thick white secretions. She was extubated to comfort care.

## 2018-04-25 NOTE — Progress Notes (Signed)
Pt terminally extubated this afternoon around 1458 by RT. This RN and Harvest DarkShae Shaffer, NP also at bedside at time of extubation. Family now at bedside with pt. Bereavement cart ordered from dietary on the way.

## 2018-04-25 NOTE — Progress Notes (Signed)
Please note patient is currently followed by outpatient Palliative at [facility]. CSW York SpanielMonica Marra and Palliative NP Harvest DarkShae Shaffer made aware. Palliative team aware of admission. Dayna BarkerKaren Robertson RN, BSN, [facility], hospital liaison [phone]

## 2018-04-25 NOTE — Progress Notes (Signed)
[employer] -  at [facility]   PATIENT NAME: Candita Borenstein    MR#:  [id number]  DATE OF BIRTH:  11/16/1940  SUBJECTIVE:  CHIEF COMPLAINT:   Chief Complaint  Patient presents with  . Shortness of Breath  In discussion with intensivist, plans are for withdrawal of care/comfort care later today  REVIEW OF SYSTEMS:  CONSTITUTIONAL: No fever, fatigue or weakness.  EYES: No blurred or double vision.  EARS, NOSE, AND THROAT: No tinnitus or ear pain.  RESPIRATORY: No cough, shortness of breath, wheezing or hemoptysis.  CARDIOVASCULAR: No chest pain, orthopnea, edema.  GASTROINTESTINAL: No nausea, vomiting, diarrhea or abdominal pain.  GENITOURINARY: No dysuria, hematuria.  ENDOCRINE: No polyuria, nocturia,  HEMATOLOGY: No anemia, easy bruising or bleeding SKIN: No rash or lesion. MUSCULOSKELETAL: No joint pain or arthritis.   NEUROLOGIC: No tingling, numbness, weakness.  PSYCHIATRY: No anxiety or depression.   ROS  DRUG ALLERGIES:   Allergies  Allergen Reactions  . Penicillins Other (See Comments)    Has patient had a PCN reaction causing immediate rash, facial/tongue/throat swelling, SOB or lightheadedness with hypotension: Unknown Has patient had a PCN reaction causing severe rash involving mucus membranes or skin necrosis: Unknown Has patient had a PCN reaction that required hospitalization: Unknown Has patient had a PCN reaction occurring within the last 10 years: Unknown If all of the above answers are "NO", then may proceed with Cephalosporin use.     VITALS:  Blood pressure (!) 100/50, pulse 92, temperature (!) 97.3 F (36.3 C), temperature source Axillary, resp. rate 16, height 5\' 2"  (1.575 m), weight 52.1 kg (114 lb 13.8 oz), SpO2 95 %.  PHYSICAL EXAMINATION:  GENERAL:  77 y.o.-year-old patient lying in the bed with no acute distress.  EYES: Pupils equal, round, reactive to light and accommodation. No scleral icterus. Extraocular muscles intact.   HEENT: Head atraumatic, normocephalic. Oropharynx and nasopharynx clear.  NECK:  Supple, no jugular venous distention. No thyroid enlargement, no tenderness.  LUNGS: Normal breath sounds bilaterally, no wheezing, rales,rhonchi or crepitation. No use of accessory muscles of respiration.  CARDIOVASCULAR: S1, S2 normal. No murmurs, rubs, or gallops.  ABDOMEN: Soft, nontender, nondistended. Bowel sounds present. No organomegaly or mass.  EXTREMITIES: No pedal edema, cyanosis, or clubbing.  NEUROLOGIC: Cranial nerves II through XII are intact. Muscle strength 5/5 in all extremities. Sensation intact. Gait not checked.  PSYCHIATRIC: The patient is alert and oriented x 3.  SKIN: No obvious rash, lesion, or ulcer.   Physical Exam LABORATORY PANEL:   CBC Recent Labs  Lab 04/17/18 0604  WBC 15.8*  HGB 7.8*  HCT 24.0*  PLT 342   ------------------------------------------------------------------------------------------------------------------  Chemistries  Recent Labs  Lab 04/13/18 0608 04/14/18 0424  04/16/18 0320  NA 140 137   < > 136  K 4.2 3.8   < > 4.8  CL 100 99   < > 97*  CO2 31 30   < > 31  GLUCOSE 111* 125*   < > 103*  BUN 47* 36*   < > 69*  CREATININE 2.59* 1.88*   < > 3.04*  CALCIUM 7.7* 7.6*   < > 8.2*  MG 2.2 1.7  --   --   AST 59*  --   --   --   ALT 42  --   --   --   ALKPHOS 120  --   --   --   BILITOT 0.6  --   --   --    < > =  values in this interval not displayed.   ------------------------------------------------------------------------------------------------------------------  Cardiac Enzymes No results for input(s): TROPONINI in the last 168 hours. ------------------------------------------------------------------------------------------------------------------  RADIOLOGY:  No results found.  ASSESSMENT AND PLAN:  Patient is a 77 year old white female end-stage renal disease  1. Acute respiratory failure status post thoracentesis No  improvement Patient had pneumothorax w/ chest tube placement Discussed with intensivist-for withdrawal of care/comfort care status later today   2. Bilateralexudativepleural effusion Plan of care as stated above  3. End-stage renal disease No further hemodialysis in discussion with intensivist Plan of care as stated above  4. Hypothyroidism Plan of care as stated above  5. History of CHF Plan of care as stated above  Plan for withdrawal of care/comfort care later today   All the records are reviewed and case discussed with Care Management/Social Workerr. Management plans discussed with the patient, family and they are in agreement.  CODE STATUS: dnr  TOTAL TIME TAKING CARE OF THIS PATIENT: 35 minutes.     POSSIBLE D/C IN 2 DAYS, DEPENDING ON CLINICAL CONDITION.   Evelena AsaMontell D Julaine Zimny M.D on 2018-04-30   Between 7am to 6pm - Pager - [phone]  After 6pm go to [email or web address] - Social research officer, governmentpassword EPAS [email or web address]  [facility] [place] [facility]  Office  [phone]  CC: Primary care physician; Center[facility]  Note: This dictation was prepared with Dragon dictation along with smaller phrase technology. Any transcriptional errors that result from this process are unintentional.

## 2018-04-25 NOTE — Progress Notes (Signed)
Pt's MAP has been in the low 60s this morning and per critical care MD notes pt's phenylephrine is not to be titrated. Dr. Lonn Georgiaonforti notified of lower MAP, and states that this is sufficient, no need to titrate phenylephrine at this time.

## 2018-04-25 NOTE — Progress Notes (Signed)
   03/25/2018 1445  Clinical Encounter Type  Visited With Family;Health care provider  Visit Type Follow-up (order request for end of life)  Spiritual Encounters  Spiritual Needs Emotional;Prayer   Chaplain responded to order regarding patient end of life.  Health care provider informed chaplain of upcoming procedure and family location.  Chaplain offered silent prayer for patient, family, and care team.  Chaplain introduced self to family and offered support and pastoral presence.  Family declined at present, but expressed openness to ongoing chaplain follow up.  Chaplain also encouraged family to page chaplain as needed.

## 2018-04-25 NOTE — Progress Notes (Signed)
Remains intubated and sedated on fentanyl infusion.   Vitals:   05/25/2018 0409 05/25/2018 0500 05/25/2018 0600 05/25/2018 0700  BP:  (!) 97/53 (!) 94/56 (!) 105/56  Pulse: 96 90 91 91  Resp: 20 17 20 17   Temp:      TempSrc:      SpO2: 95% 94% 94% 93%  Weight:      Height:       Vent Mode: PRVC FiO2 (%):  [30 %] 30 % Set Rate:  [16 bmp] 16 bmp Vt Set:  [400 mL] 400 mL PEEP:  [5 cmH20] 5 cmH20 Plateau Pressure:  [28 cmH20-37 cmH20] 28 cmH20  Intubated, sedated Very frail NCAT, sclerae white No JVD Bilateral rhonchi Regular, no M Abdomen soft, NABS, NT Extremities cool, no edema No focal neurologic deficits  BMP Latest Ref Rng & Units 04/16/2018 04/15/2018 04/14/2018  Glucose 70 - 99 mg/dL 098(J103(H) 191(Y117(H) 782(N125(H)  BUN 8 - 23 mg/dL 56(O69(H) 13(Y55(H) 86(V36(H)  Creatinine 0.44 - 1.00 mg/dL 7.84(O3.04(H) 9.62(X2.60(H) 5.28(U1.88(H)  Sodium 135 - 145 mmol/L 136 138 137  Potassium 3.5 - 5.1 mmol/L 4.8 4.4 3.8  Chloride 98 - 111 mmol/L 97(L) 97(L) 99  CO2 22 - 32 mmol/L 31 30 30   Calcium 8.9 - 10.3 mg/dL 8.2(L) 7.9(L) 7.6(L)   CBC Latest Ref Rng & Units 04/17/2018 04/16/2018 04/15/2018  WBC 3.6 - 11.0 K/uL 15.8(H) 18.0(H) 12.9(H)  Hemoglobin 12.0 - 16.0 g/dL 7.8(L) 8.0(L) 8.1(L)  Hematocrit 35.0 - 47.0 % 24.0(L) 25.0(L) 25.3(L)  Platelets 150 - 440 K/uL 342 341 315   Chest tube: On waterseal, no air leak  CXR: NNF  IMPRESSION: Acute on chronic respiratory failure with hypoxemia Chronic bilateral pleural effusions Right pneumothorax after thoracentesis.  No air leak.   Prolonged ventilator dependence.  Not weanable  Dilated cardiomyopathy Intermittent sinus tachycardia ESRD Adult failure to thrive.  SNF resident Severe protein-calorie malnutrition/cachexia   She is now comfort care with plan for terminal extubation 07/26. We will keep phenylephrine @ a fixed dose, not to be titrated. No further HD.   Ann KindredJohn Oluwadamilare Tobler, DO

## 2018-04-25 NOTE — Progress Notes (Signed)
Daily Progress Note   Patient Name: Vonette Grosso       Date: 27-Apr-2018 DOB: 05/06/1941  Age: 77 y.o. MRN#: [id number] Attending Physician: Gorden Harms, MD Primary Care Physician: [place] Date: 04/12/2018  Reason for Consultation/Follow-up: Establishing goals of care  Subjective: Patient does not respond to me today, remains on fentanyl drip and pressors.   Length of Stay: 9  Current Medications: Scheduled Meds:  . chlorhexidine gluconate (MEDLINE KIT)  15 mL Mouth Rinse BID  . Chlorhexidine Gluconate Cloth  6 each Topical Q0600  . famotidine  20 mg Per Tube Daily  . latanoprost  1 drop Both Eyes QHS  . levothyroxine  25 mcg Intravenous Daily  . mouth rinse  15 mL Mouth Rinse 10 times per day  . sodium chloride flush  10-40 mL Intracatheter Q12H    Continuous Infusions: . feeding supplement (VITAL AF 1.2 CAL) 20 mL/hr at 04-27-2018 0700  . fentaNYL infusion INTRAVENOUS 400 mcg/hr (Apr 27, 2018 0700)  . phenylephrine (NEO-SYNEPHRINE) Adult infusion 140 mcg/min (27-Apr-2018 0939)    PRN Meds: acetaminophen **OR** acetaminophen, docusate, ipratropium-albuterol, metoprolol tartrate, midazolam, [DISCONTINUED] ondansetron **OR** ondansetron (ZOFRAN) IV, pentafluoroprop-tetrafluoroeth, pentafluoroprop-tetrafluoroeth, sodium chloride flush  Physical Exam    Constitutional: She has a sickly appearance. No distress. She is sedated and intubated. Frail. HENT:  Head: Normocephalic and atraumatic.  Cardiovascular: Regular rhythm and rate.  Pulmonary/Chest: Breath sounds normal. She is intubated.  Abdominal: Soft. Bowel sounds are normal.  Neurological: Unable to assess orientation.  Skin: Skin is warm and dry.   Vital Signs: BP (!) 89/52   Pulse 87   Temp (!) 97.4 F  (36.3 C) (Axillary)   Resp 19   Ht _0  (1.575 m)   Wt 114 lb 13.8 oz (52.1 kg)   SpO2 96%   BMI 21.01 kg/m  SpO2: SpO2: 96 % O2 Device: O2 Device: Ventilator O2 Flow Rate: O2 Flow Rate (L/min): 3 L/min  Intake/output summary:   Intake/Output Summary (Last 24 hours) at 04-27-18 1024 Last data filed at 2018/04/27 1856 Gross per 24 hour  Intake 2632.28 ml  Output 65 ml  Net 2567.28 ml   LBM: Last BM Date: 04/13/18 Baseline Weight: Weight: 125 lb (56.7 kg) Most recent weight: Weight: 114 lb 13.8 oz (52.1 kg)       Palliative Assessment/Data: PPS 30%    Flowsheet Rows     Most Recent Value  Intake Tab  Referral Department  Nephrology  Unit at Time of Referral  ICU  Palliative Care Primary Diagnosis  Nephrology  Date Notified  04/11/18  Palliative Care Type  New Palliative care  Reason for referral  Clarify Goals of Care  Date of Admission  04/08/2018  Date first seen by Palliative Care  04/12/18  # of days Palliative referral response time  1 Day(s)  # of days IP prior to Palliative referral  2  Clinical Assessment  Psychosocial & Spiritual Assessment  Palliative Care Outcomes  Patient/Family meeting held?  Yes  Who was at the meeting?  niece and nephew, other niece via speaker phone  Palliative Care Outcomes  Clarified goals of care, Counseled regarding hospice, Provided psychosocial or spiritual support, Changed  CPR status      Patient Active Problem List   Diagnosis Date Noted  . Pressure injury of skin 04/18/2018  . Palliative care by specialist   . Goals of care, counseling/discussion   . Bilateral pleural effusion 04/10/2018  . Hypothyroidism 04/10/2018  . Acute on chronic combined systolic and diastolic CHF (congestive heart failure) ([place]) 04/10/2018  . Acute respiratory failure ([place]) 04/10/2018  . Hyperkalemia 02/12/2018  . Hypotension 01/10/2018  . Protein-calorie malnutrition, severe 10/20/2017  . Encephalopathy 10/19/2017  . ESRD on dialysis  ([place]) 09/12/2017  . Essential hypertension 09/12/2017  . Failure to thrive in adult 08/17/2017  . Dyspnea 08/14/2017    Palliative Care Assessment & Plan   HPI: 77 y.o. female  with past medical history of ESRD on HD, cardiomyopathy (EF 30-35%), chronic pleural effusions, HTN, glaucoma, and blindness  admitted on 04/18/2018 with shortness of breath, abdominal pain, and missed HD appts. Patient found to have hypotension and shock. On 7/17 patient had hypoxia post thoracentesis and patient was intubated, had central line and chest tube placed. Per pulmonology, patient may have trapped lung and thoracentesis resulted in fluid shift and worsening respiratory failure. Patient has failed SBT mornings of 7/18 and 7/19. Also found to have enlarged gallbladder on CT - plan for HIDA after extubation. Noted that patient has had 4 admissions in last 6 months - appear to be d/t noncompliance with HD. PMT consulted by nephrology.   Assessment: 4th family meeting: Met with patient's niece, [name], and Linda's husband, other niece, Olegario Shearer, and patient's significant other, Herbie [place]. 2 other family members present. Dr. Jefferson Fuel joined [place] for this meeting. Discussed patient's clinical course with family members. All questions addressed by Dr. Jefferson Fuel.  We discussed patient's dependence on ventilator, failure to wean, ESRD, and heart failure. I also brought up what they had shared with me in previous meetings about patient's poor quality of life baseline - essentially bedbound, multiple statements about wanting to die, and noncompliance with HD.   After continued discussion, all of patient's family members agree patient would not want to live like this. They agree she would not have wanted to be on the ventilator and wanted to stop HD. Discussed options moving forward including focusing on comfort and freeing Ms. Sobek from the ventilator. Family agrees that this is most reasonable plan of care moving forward. They agree  to proceed with extubation.  Recommendations/Plan:  Full comfort care, terminal extubation  1 mg ativan prior to extubation, prn ativan, dilaudid infusion, prn dilaudid boluses  PMT will continue to support and update family  Code Status:  DNR  Prognosis:   Hours - Days d/t respiratory failure  Discharge Planning:  Anticipated Hospital Death   Care plan was discussed with Dr. Jefferson Fuel, bedside RN, patient's family  Thank you for allowing the Palliative Medicine Team to assist in the care of this patient.  13:00-15:00  Total Time 120 minutes Prolonged Time Billed  yes      Greater than 50%  of this time was spent counseling and coordinating care related to the above assessment and plan.  Juel Burrow, DNP, [facility] Palliative Medicine Team Team Phone # [phone]  Pager [phone]

## 2018-04-25 DEATH — deceased

## 2018-05-26 NOTE — Death Summary Note (Signed)
DEATH SUMMARY   Patient Details  Name: Ann Cunningham MRN: [id number] DOB: 02-23-1941  Admission/Discharge Information   Admit Date:  01-01-2018  Date of Death: Date of Death: 04/03/2018  Time of Death: Time of Death: 1634  Length of Stay: 9  [facility]   Reason(s) for Hospitalization  Shortness of breath  Diagnoses  Preliminary cause of death: Respiratory Failure Secondary Diagnoses (including complications and co-morbidities):  Principal Problem:   Bilateral pleural effusion Active Problems:   ESRD on dialysis (HCC)   Hypothyroidism   Acute on chronic combined systolic and diastolic CHF (congestive heart failure) (HCC)   Acute respiratory failure (HCC)   Palliative care by specialist   Goals of care, counseling/discussion   Pressure injury of skin   Comfort measures only status   Brief Hospital Course (including significant findings, care, treatment, and services provided and events leading to death)   77 year old female with ESRD, hypothyroidism, hypertension, and CHF who presented to the emergency department for shortness of breath from [facility]. Patient is a dialysis patient with ESRD who refused dialysis on 7/16 as she was not feeling well.  In the ED a chest CT scan was done that showed bilateral pleural effusions. During thoracentesis (7/17) patient become hypoxic (40s) and a rapid response was called. Patient improved into the 90s with 6L of O2 nasal cannula. Pneumothorax was noted post procedure. Patient transported to the ICU for continued distress and altered mental status. Upon arrival to ICU patient in continued distress with tachycardia and tachypnea. Patient was placed on BiPAP. ABG on RA showed pH of 7.11 and PCO2 of 90. Patient was intubated and placed on mechanical ventilation. Central line was placed. Chest tube was inserted. patient was hypotensive requiring pressors, also has a history of end-stage renal  disease, nephrology was consulted and hemodialysis was instituted. After multiple attempts at spontaneousawakening and breathing trials patient was felt to be respiratory dependent for the immediate future. Palliative care was consulted and discussions with family outlining goals of care. Family wished comfort measures on 7/26 patient was extubated with pain and anxiety control. Patient deceased 03/30/2018       Pertinent Labs and Studies  Significant Diagnostic Studies Ct Abdomen Pelvis Wo Contrast  Result Date: 01-01-2018 CLINICAL DATA:  Patient refused dialysis today and presents with abdominal pain and tachycardia. EXAM: CT CHEST, ABDOMEN AND PELVIS WITHOUT CONTRAST TECHNIQUE: Multidetector CT imaging of the chest, abdomen and pelvis was performed following the standard protocol without IV contrast. COMPARISON:  CXR 004-05-2018 FINDINGS: CT CHEST FINDINGS Cardiovascular: Cardiomegaly with three-vessel coronary arteriosclerosis. Trace pericardial effusion. Minimal aortic atherosclerosis without aneurysm. Mild dilatation of the main pulmonary artery to 3 cm may reflect a component of chronic pulmonary hypertension. Mediastinum/Nodes: No thyromegaly. Midline patent trachea and mainstem bronchi. No adenopathy. Esophagus is unremarkable. Lungs/Pleura: Moderate left and moderate to large right pleural effusions with adjacent compressive atelectasis. Bronchiectasis to both lower lobes. Subpleural atelectasis in the lingula left lower lobe. Musculoskeletal: Thoracic spondylosis. No aggressive osseous lesions. CT ABDOMEN PELVIS FINDINGS Hepatobiliary: Markedly distended gallbladder with punctate dependent calculus noted. No wall thickening or pericholecystic fluid. The unenhanced liver is unremarkable. Portal vein is difficult to assess given lack of IV contrast there is some heterogeneity seen within. Pancreas: Atrophic without acute abnormality. Spleen: Normal Adrenals/Urinary Tract: Atrophic kidneys. No  nephrolithiasis nor hydroureteronephrosis. The urinary bladder demonstrates tiny layering calculi along the left posterior wall. Stomach/Bowel: Stomach is within normal limits. Appendix appears normal. No evidence  of bowel wall thickening, distention, or inflammatory changes. Vascular/Lymphatic: Nonaneurysmal moderate atherosclerosis of the aorta and branch vessels. No lymphadenopathy. Reproductive: Uterus and bilateral adnexa are unremarkable. Other: Diffuse soft tissue anasarca. Phleboliths and/or injection granulomata are identified overlying the buttocks. Musculoskeletal: No acute osseous abnormality. Lumbar spondylosis with degenerative disc disease L4-5 and L5-S1 with multilevel facet arthropathy. IMPRESSION: Chest CT: 1. Cardiomegaly with 3 vessel coronary arteriosclerosis. 2. Bilateral pleural effusions with compressive atelectasis, moderate on the left and moderate to large on the right. 3. Slight dilatation of the main pulmonary artery to 3 cm compatible with chronic pulmonary hypertension. 4. Thoracic spondylosis. CT AP: 1. Marked distention of the gallbladder without pericholecystic fluid or wall thickening. Punctate dependent calculus is identified within. Findings may represent a hydropic gallbladder or a fasting state of the gallbladder counting for the distention. 2. Atrophic kidneys without obstructive uropathy. Findings would be in keeping with the patient's dialysis dependence. 3. Diffuse soft tissue anasarca. 4. Degenerative disc disease L4-5 and L5-S1. Electronically Signed   By: Tollie Eth M.D.   On: 04/30/18 23:20   Dg Chest 1 View  Result Date: 04/12/2018 CLINICAL DATA:  77 year old female with shortness of breath. EXAM: CHEST  1 VIEW COMPARISON:  Multiple priors, most recently chest x-ray 04/11/2018. FINDINGS: An endotracheal tube is in place with tip 2.6 cm above the carina. Nasogastric tube extends into the proximal stomach with side port just distal to the gastroesophageal  junction. Right subclavian central venous catheter with tip terminating at the superior cavoatrial junction. Right-sided small bore chest tube in place with tip in the mid right hemithorax. Small residual right pneumothorax remains (predominantly in the right mid to lower hemithorax), decreased in size compared to yesterday's examination. Lung volumes are low. Elevation of left hemidiaphragm. Small left pleural effusion. Left basilar opacity may reflect associated atelectasis and/or consolidation. No evidence of pulmonary edema. Heart size is mildly enlarged. The patient is rotated to the left on today's exam, resulting in distortion of the mediastinal contours and reduced diagnostic sensitivity and specificity for mediastinal pathology. IMPRESSION: 1. Support apparatus, as above. 2. Decreasing small residual right pneumothorax with right-sided chest tube stable in position, as above. 3. Low lung volumes with atelectasis and/or consolidation in the left lower lobe and small left pleural effusion. Electronically Signed   By: Trudie Reed M.D.   On: 04/12/2018 08:17   Dg Chest 1 View  Result Date: 04/10/2018 CLINICAL DATA:  Post right-sided thoracentesis. Known right-sided pneumothorax. EXAM: CHEST  1 VIEW COMPARISON:  Chest radiograph-04-30-2018; 02/12/2018; chest CT-2018/04/30 FINDINGS: Grossly unchanged enlarged cardiac silhouette and mediastinal contours. Interval reduction/resolution of right-sided pleural effusion post thoracentesis. Minimally improved aeration of the right lung base with persistent right basilar hydropneumothorax. No mediastinal shift. Minimally improved aeration the right lower lung with persistent heterogeneous/consolidative opacities. Grossly unchanged small left-sided effusion with associated left basilar heterogeneous/consolidative opacities. No new focal airspace opacities. Mild pulmonary venous congestion. No acute osseus abnormalities. IMPRESSION: 1. Interval  reduction/resolution of right-sided effusion post thoracentesis with residual right basilar pneumothorax. 2. Unchanged small left-sided effusion with associated left basilar heterogeneous/consolidative opacities, atelectasis versus infiltrate. 3. Minimally improved aeration of the right lung base. Critical Value/emergent results were called by telephone at the time of interpretation on 04/10/2018 at 11:09 am to Dr. Allena Katz, who verbally acknowledged these results. Electronically Signed   By: Simonne Come M.D.   On: 04/10/2018 11:08   Dg Abd 1 View  Result Date: 04/10/2018 CLINICAL DATA:  Orogastric tube placement EXAM: ABDOMEN -  1 VIEW COMPARISON:  None. FINDINGS: Orogastric tube tip and side port project over the stomach. Bilateral pleural effusions with associated atelectasis. Prominent small bowel in the central abdomen. IMPRESSION: Orogastric tube tip and side port projecting over the stomach. Electronically Signed   By: Deatra Robinson M.D.   On: 04/10/2018 14:53   Ct Chest Wo Contrast  Result Date: 04/13/2018 CLINICAL DATA:  Patient refused dialysis today and presents with abdominal pain and tachycardia. EXAM: CT CHEST, ABDOMEN AND PELVIS WITHOUT CONTRAST TECHNIQUE: Multidetector CT imaging of the chest, abdomen and pelvis was performed following the standard protocol without IV contrast. COMPARISON:  CXR 04/07/2018 FINDINGS: CT CHEST FINDINGS Cardiovascular: Cardiomegaly with three-vessel coronary arteriosclerosis. Trace pericardial effusion. Minimal aortic atherosclerosis without aneurysm. Mild dilatation of the main pulmonary artery to 3 cm may reflect a component of chronic pulmonary hypertension. Mediastinum/Nodes: No thyromegaly. Midline patent trachea and mainstem bronchi. No adenopathy. Esophagus is unremarkable. Lungs/Pleura: Moderate left and moderate to large right pleural effusions with adjacent compressive atelectasis. Bronchiectasis to both lower lobes. Subpleural atelectasis in the lingula  left lower lobe. Musculoskeletal: Thoracic spondylosis. No aggressive osseous lesions. CT ABDOMEN PELVIS FINDINGS Hepatobiliary: Markedly distended gallbladder with punctate dependent calculus noted. No wall thickening or pericholecystic fluid. The unenhanced liver is unremarkable. Portal vein is difficult to assess given lack of IV contrast there is some heterogeneity seen within. Pancreas: Atrophic without acute abnormality. Spleen: Normal Adrenals/Urinary Tract: Atrophic kidneys. No nephrolithiasis nor hydroureteronephrosis. The urinary bladder demonstrates tiny layering calculi along the left posterior wall. Stomach/Bowel: Stomach is within normal limits. Appendix appears normal. No evidence of bowel wall thickening, distention, or inflammatory changes. Vascular/Lymphatic: Nonaneurysmal moderate atherosclerosis of the aorta and branch vessels. No lymphadenopathy. Reproductive: Uterus and bilateral adnexa are unremarkable. Other: Diffuse soft tissue anasarca. Phleboliths and/or injection granulomata are identified overlying the buttocks. Musculoskeletal: No acute osseous abnormality. Lumbar spondylosis with degenerative disc disease L4-5 and L5-S1 with multilevel facet arthropathy. IMPRESSION: Chest CT: 1. Cardiomegaly with 3 vessel coronary arteriosclerosis. 2. Bilateral pleural effusions with compressive atelectasis, moderate on the left and moderate to large on the right. 3. Slight dilatation of the main pulmonary artery to 3 cm compatible with chronic pulmonary hypertension. 4. Thoracic spondylosis. CT AP: 1. Marked distention of the gallbladder without pericholecystic fluid or wall thickening. Punctate dependent calculus is identified within. Findings may represent a hydropic gallbladder or a fasting state of the gallbladder counting for the distention. 2. Atrophic kidneys without obstructive uropathy. Findings would be in keeping with the patient's dialysis dependence. 3. Diffuse soft tissue anasarca. 4.  Degenerative disc disease L4-5 and L5-S1. Electronically Signed   By: Tollie Eth M.D.   On: 04/20/2018 23:20   Dg Chest Port 1 View  Result Date: 04/17/2018 CLINICAL DATA:  Respiratory failure EXAM: PORTABLE CHEST 1 VIEW COMPARISON:  04/16/2018 FINDINGS: Right chest tube, endotracheal tube, central line and NG tube remain in place, unchanged. Small lateral right pneumothorax again noted, stable. Small bilateral pleural effusions and bilateral perihilar and lower lobe opacities are similar prior study. IMPRESSION: No significant change since prior study. Continued small lateral right pneumothorax. Bilateral perihilar and lower lobe opacities and small effusions. Electronically Signed   By: Charlett Nose M.D.   On: 04/17/2018 07:14   Dg Chest Port 1 View  Result Date: 04/16/2018 CLINICAL DATA:  Respiratory failure EXAM: PORTABLE CHEST 1 VIEW COMPARISON:  04/14/2018, 04/13/2018, 04/12/2018, 04/15/2018 FINDINGS: Endotracheal tube tip is about 3.4 cm superior to the carina. Esophageal tube tip is below the  diaphragm and in the left upper quadrant. Right-sided central venous catheter tip overlies the cavoatrial region. Right-sided chest tube remains in place. No change in small right lateral basal pneumothorax. Small bilateral pleural effusions. Dense consolidation at the left base. Stable cardiomegaly. IMPRESSION: 1. Support lines and tubes as above. No significant interval change in small right lateral and basilar pneumothorax 2. Cardiomegaly with vascular congestion and continued pleural effusions. 3. No change in left greater than right bibasilar airspace disease which may reflect atelectasis or pneumonia Electronically Signed   By: Jasmine Pang M.D.   On: 04/16/2018 03:57   Dg Chest Port 1 View  Result Date: 04/15/2018 CLINICAL DATA:  Follow-up pneumothorax EXAM: PORTABLE CHEST 1 VIEW COMPARISON:  04/14/2018 FINDINGS: Cardiac shadow is stable. Endotracheal tube and nasogastric catheter are again noted in  satisfactory position. Right-sided pigtail catheter is again noted with mild residual right pneumothorax laterally and inferiorly stable from the previous exam. Right jugular line is again noted and stable. Left basilar atelectasis has improved slightly in the interval from the prior exam. No new focal abnormality is noted. IMPRESSION: Stable right basilar pneumothorax with chest tube in place. Slight improvement in the degree of left basilar atelectasis. Electronically Signed   By: Alcide Clever M.D.   On: 04/15/2018 07:10   Dg Chest Port 1 View  Result Date: 04/14/2018 CLINICAL DATA:  Follow-up pneumothorax. EXAM: PORTABLE CHEST 1 VIEW COMPARISON:  Yesterday. FINDINGS: An approximately 15% right basilar pneumothorax has not changed significantly. A right pleural pigtail catheter remains in place. Right subclavian catheter tip in the superior vena cava. Endotracheal tube in satisfactory position. Nasogastric tube tip and side hole in the proximal stomach. The cardiac silhouette remains borderline enlarged. There has been no significant change in left basilar airspace opacity and pleural fluid. Decreased ill-defined opacity at the right lung base. Thoracic spine degenerative changes. IMPRESSION: 1. No significant change in left basilar atelectasis or pneumonia and left pleural fluid. 2. Stable approximately 15% right basilar pneumothorax. 3. Decreased right basilar atelectasis and fluid. Electronically Signed   By: Beckie Salts M.D.   On: 04/14/2018 08:00   Dg Chest Port 1 View  Result Date: 04/13/2018 CLINICAL DATA:  Acute respiratory failure. EXAM: PORTABLE CHEST 1 VIEW COMPARISON:  Chest x-ray from yesterday. FINDINGS: Unchanged endotracheal tube, enteric tube, right subclavian central venous catheter, and right pigtail chest tube. Stable cardiomediastinal silhouette. Normal pulmonary vascularity. Unchanged small residual right pneumothorax in the mid to lower hemithorax. Unchanged volume loss in the right  lower lobe. Unchanged small left pleural effusion with left lower lobe airspace disease. No acute osseous abnormality. IMPRESSION: 1. Unchanged small residual right pneumothorax and volume loss in the right lower lobe. 2. Unchanged small left pleural effusion with adjacent left lower lobe atelectasis/infiltrate. 3. Stable support apparatus. Electronically Signed   By: Obie Dredge M.D.   On: 04/13/2018 08:56   Dg Chest Port 1 View  Result Date: 04/11/2018 CLINICAL DATA:  Intubation. EXAM: PORTABLE CHEST 1 VIEW COMPARISON:  04/10/2018. FINDINGS: Endotracheal tube, NG tube, right subclavian line, right chest tube in stable position. Stable cardiomegaly. Persistent bibasilar atelectasis/infiltrates and bilateral pleural effusions. Persistent right-sided pneumothorax with slight interim improvement. IMPRESSION: 1. Lines and tubes including right chest tube in stable position. Slight improvement of right-sided pneumothorax. 2. Bibasilar atelectasis and bilateral pleural effusions, left side greater than right again noted. No significant change from prior exam. Electronically Signed   By: Maisie Fus  Register   On: 04/11/2018 07:37   Dg Chest  Port 1 View  Result Date: 04/10/2018 CLINICAL DATA:  Post intubation and right-sided chest tube placement EXAM: PORTABLE CHEST 1 VIEW COMPARISON:  Earlier same day (multiple examinations) 03/25/2018; chest CT-04/09/2028 FINDINGS: Grossly unchanged cardiac silhouette and mediastinal contours with partial obscuration of the left heart border secondary to unchanged small left-sided effusion associated left basilar heterogeneous/consolidative opacities. No new focal airspace opacities. Endotracheal tube overlies the tracheal air column with tip superior to the carina. Enteric tube tip and side port project below the left hemidiaphragm. Interval placement of right subclavian vein approach central venous catheter with tip projected over the superior cavoatrial junction. Interval  placement of a right lateral chest tube with reduction/resolution of right-sided effusion and unchanged small right basilar hydropneumothorax. No mediastinal shift. No acute osseus abnormalities. Degenerative change of the right glenohumeral joint is suspected though incompletely evaluated. IMPRESSION: 1. Interval placement of right-sided chest tube with reduction/resolution of right-sided effusion post thoracentesis in grossly unchanged small right basilar hydropneumothorax. 2. Right subclavian vein approach central venous catheter tip projects over the superior cavoatrial junction. 3. Stable position of remaining support apparatus. 4. Unchanged small left-sided effusion associated left basilar opacities, atelectasis versus infiltrate. No new focal airspace opacities. Electronically Signed   By: Simonne Come M.D.   On: 04/10/2018 14:47   Portable Chest X-ray  Result Date: 04/10/2018 CLINICAL DATA:  77 year old female with a history of intubation. EXAM: PORTABLE CHEST 1 VIEW COMPARISON:  04/10/2018, 02/12/2018, chest CT 04/18/2018 FINDINGS: Cardiomediastinal silhouette unchanged in size and contour, with the heart borders partially obscured by overlying lung/pleural disease. Dense opacities at the bilateral lung bases, worst on the left. Small right-sided pneumothorax persists. Interval placement of endotracheal tube which terminates approximately 3.2 cm above the carina. Interval placement of gastric tube terminating in the left upper abdomen out of the field of view. IMPRESSION: Interval placement of endotracheal tube, terminating above the carina approximately 3.2 cm. Interval placement of gastric tube. Similar appearance of right hydropneumothorax, unchanged from the prior. Dense opacity at the left base, likely a combination of atelectasis/consolidation, pleural fluid, and/or edema. Electronically Signed   By: Gilmer Mor D.O.   On: 04/10/2018 13:23   Dg Chest Port 1 View  Result Date:  04/10/2018 CLINICAL DATA:  Post recent right-sided thoracentesis with persistent respiratory distress. EXAM: PORTABLE CHEST 1 VIEW COMPARISON:  Earlier same day; 04/23/2018; chest CT-04/04/2018 FINDINGS: Grossly unchanged enlarged cardiac silhouette and mediastinal contours. Grossly unchanged small loculated right basilar hydropneumothorax post recent thoracentesis. No midline shift. Unchanged small left-sided effusion with associated left basilar opacities. No new focal airspace opacities. Mild pulmonary venous congestion without frank evidence of edema. No acute osseus abnormalities. No acute osseus abnormalities. IMPRESSION: 1. Grossly unchanged small loculated right basilar hydropneumothorax post recent thoracentesis. 2. Unchanged small left-sided effusion associated left basilar opacities, atelectasis versus infiltrate. 3. Pulmonary venous congestion without frank evidence of edema. Electronically Signed   By: Simonne Come M.D.   On: 04/10/2018 12:25   Dg Chest Portable 1 View  Result Date: 03/28/2018 CLINICAL DATA:  Shortness of breath. Refused dialysis today. History of CHF. EXAM: PORTABLE CHEST 1 VIEW COMPARISON:  Chest radiograph Feb 12, 2018 FINDINGS: Stable cardiomegaly. Pulmonary vascular congestion. Similar moderate to large bilateral pleural effusions. Biapical pleural capping. Prominent pleural reflection mid lung zone. No mediastinal shift. Soft tissue planes and included osseous structures are nonacute. High riding bilateral humeral heads seen with old rotator cuff injuries. IMPRESSION: 1. Moderate to large pleural effusions, possible RIGHT hydropneumothorax without mediastinal shift. Similar  underlying consolidation. 2. Stable cardiomegaly and pulmonary vascular congestion. 3. Acute findings discussed with and reconfirmed by Dr.GRAYDON GOODMAN on 04/17/2018 at 9:49 pm. Electronically Signed   By: Awilda Metro M.D.   On: 04/21/2018 21:49   US Abdomen Limited Ruq  Result Date:  04/11/2018 CLINICAL DATA:  Question of gallstones on recent CT EXAM: ULTRASOUND ABDOMEN LIMITED RIGHT UPPER QUADRANT COMPARISON:  CT abdomen and pelvis April 09, 2018 FINDINGS: Gallbladder: Gallbladder appears somewhat distended with irregularities along the gallbladder wall. Sludge is seen in the gallbladder. There are no convincing echogenic foci which move and shadow as is expected with gallstones. There is no pericholecystic fluid. No sonographic Murphy sign noted by sonographer. Common bile duct: Diameter: 4 mm. No intrahepatic or extrahepatic biliary duct dilatation. Liver: No focal lesion identified. Within normal limits in parenchymal echogenicity. Portal vein is patent on color Doppler imaging with normal direction of blood flow towards the liver. Trace ascites. IMPRESSION: 1. Sludge in gallbladder. No convincing gallstones. Tiny gallstones could be obscured by sludge. The gallbladder is somewhat distended with mild irregularity along the gallbladder wall. Gallbladder wall is not frankly thickened, however. Question underlying inflammation within the gallbladder wall. This combination of findings may warrant correlation with nuclear medicine hepatobiliary imaging study to assess for cystic duct patency. 2.  Slight ascites. 3.  Study otherwise unremarkable. Electronically Signed   By: Bretta Bang III M.D.   On: 04/11/2018 08:59   US Thoracentesis Asp Pleural Space W/img Guide  Result Date: 04/10/2018 INDICATION: Symptomatic right sided pleural effusion. Note, patient has known right basilar hydropneumothorax demonstrated on preceding chest radiograph and chest CT. EXAM: US THORACENTESIS ASP PLEURAL SPACE W/IMG GUIDE COMPARISON:  Chest radiograph-04/18/2018; chest CT-04/18/2018; MEDICATIONS: None. COMPLICATIONS: SIR LEVEL B - Normal therapy, includes overnight admission for observation. Patient experienced a sudden transient decreased oxygen saturation to the level of 40s necessitating calling of a  rapid response, however her oxygen saturation levels normalized to the mid 90s with the administration of supplement total oxygen and postprocedural chest radiograph demonstrated resolution of the right-sided effusion with no change in the size of the right basilar pneumothorax and no additional intervention was required. TECHNIQUE: Informed written consent was obtained from the patient after a discussion of the risks, benefits and alternatives to treatment. A timeout was performed prior to the initiation of the procedure. With the patient positioned left lateral decubitus, initial ultrasound scanning demonstrates a moderate right-sided anechoic pleural effusion. The lower chest was prepped and draped in the usual sterile fashion. 1% lidocaine was used for local anesthesia. An ultrasound image was saved for documentation purposes. An 8 Fr Safe-T-Centesis catheter was introduced. The thoracentesis was performed. The catheter was removed and a dressing was applied. Patient experienced a sudden transient decreased oxygen saturation to the level of 40s necessitating calling of a rapid response, however her oxygen saturation levels normalized to the mid 90s with the administration of supplement total oxygen. The patient had a portable upright chest radiograph which demonstrated resolution of the right-sided effusion with no change in the size of the right basilar pneumothorax and no additional intervention was required. FINDINGS: A total of approximately 400 cc of blood-tinged fluid was removed. IMPRESSION: Successful ultrasound-guided right sided thoracentesis yielding 400 cc of blood tinged pleural fluid. Patient experienced a sudden transient decreased oxygen saturation to the level of 40s necessitating calling of a rapid response, however her oxygen saturation levels normalized to the mid 90s with the administration of supplement total oxygen and postprocedural chest  radiograph demonstrated resolution of the  right-sided effusion with no change in the size of the right basilar pneumothorax and no additional intervention was required. Electronically Signed   By: Simonne Come M.D.   On: 04/10/2018 11:20    Microbiology No results found for this or any previous visit (from the past 240 hour(s)).  Lab Basic Metabolic Panel: No results for input(s): NA, K, CL, CO2, GLUCOSE, BUN, CREATININE, CALCIUM, MG, PHOS in the last 168 hours. Liver Function Tests: No results for input(s): AST, ALT, ALKPHOS, BILITOT, PROT, ALBUMIN in the last 168 hours. No results for input(s): LIPASE, AMYLASE in the last 168 hours. No results for input(s): AMMONIA in the last 168 hours. CBC: No results for input(s): WBC, NEUTROABS, HGB, HCT, MCV, PLT in the last 168 hours. Cardiac Enzymes: No results for input(s): CKTOTAL, CKMB, CKMBINDEX, TROPONINI in the last 168 hours. Sepsis Labs: No results for input(s): PROCALCITON, WBC, LATICACIDVEN in the last 168 hours.   Emari Demmer 04/25/2018, 9:42 AM

## 2019-08-19 IMAGING — US US THORACENTESIS ASP PLEURAL SPACE W/IMG GUIDE
1 series · 2 of 2 positions shown · non-contrast
Comparison: 08/14/2017 thoracentesis and chest x-ray on 08/17/2017.

CLINICAL DATA: Recurrent left pleural effusion and respiratory
failure. Status post left thoracentesis yielding 800 mL of pleural
fluid on 08/14/2017.

EXAM:
ULTRASOUND GUIDED THORACENTESIS

[Series 1: us thoracentesis asp pleural space w/img guide · 0.25mm/px · 2 of 2 slices shown]
[im 1/2]
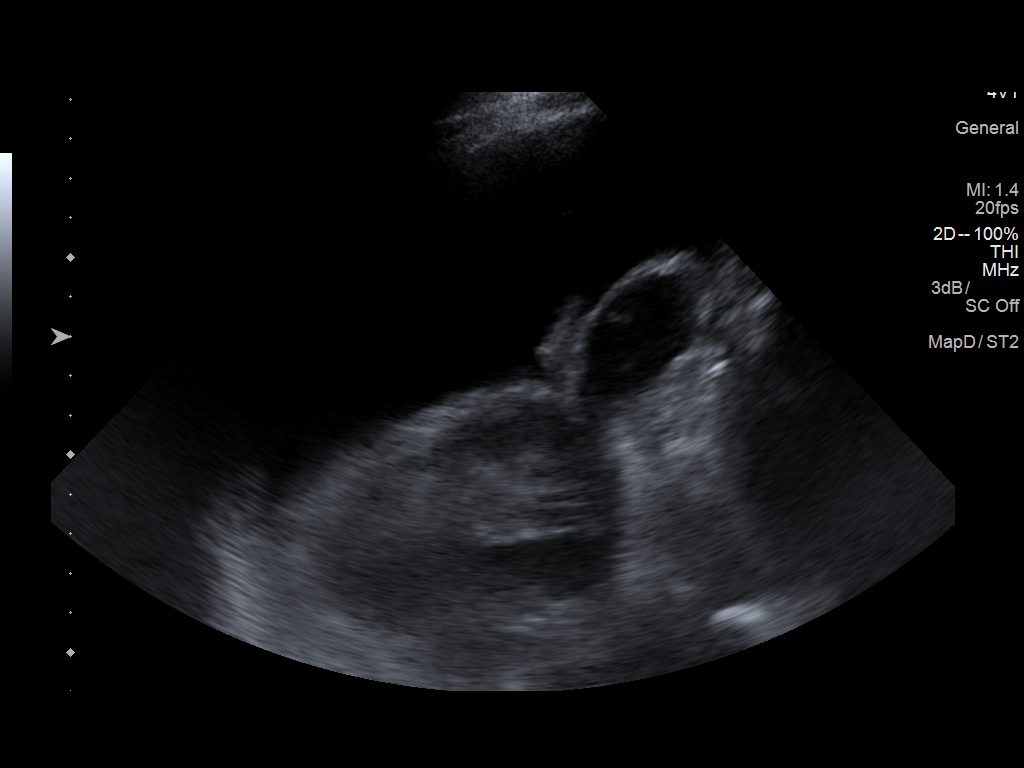
[im 2/2]
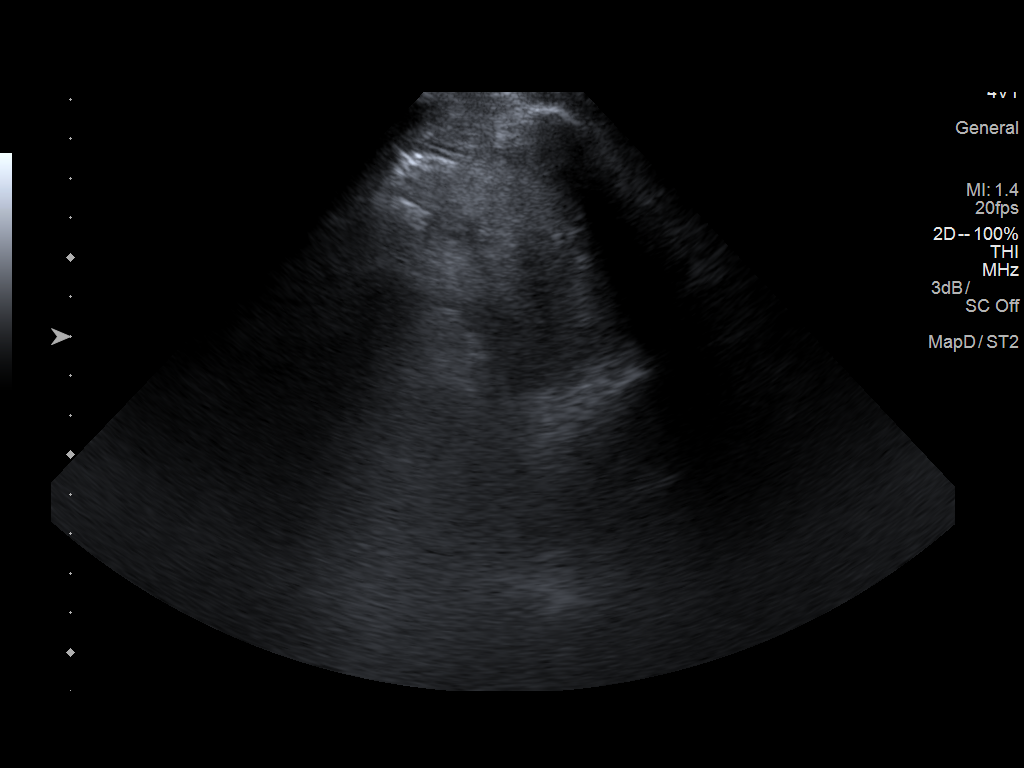

[2 of 2 positions shown; findings below may reference images not displayed]

PROCEDURE:
An ultrasound guided thoracentesis was thoroughly discussed with the
patient and questions answered. The benefits, risks, alternatives
and complications were also discussed. The patient understands and
wishes to proceed with the procedure. Written consent was obtained.

Ultrasound was performed to localize and mark an adequate pocket of
fluid in the left chest. The area was then prepped and draped in the
normal sterile fashion. 1% Lidocaine was used for local anesthesia.
Under ultrasound guidance a 6 French Safe-T-Centesis catheter was
introduced. Thoracentesis was performed. The catheter was removed
and a dressing applied.

COMPLICATIONS:
None
FINDINGS: A total of approximately 700 mL of clear, yellow fluid was removed.
A fluid sample was sent for laboratory analysis.
IMPRESSION: Successful ultrasound guided left thoracentesis yielding 700 mL of
pleural fluid.

## 2019-12-26 IMAGING — US US ABDOMEN LIMITED
1 series · 13 of 25 positions shown · non-contrast
Comparison: CT abdomen and pelvis April 09, 2018

CLINICAL DATA: Question of gallstones on recent CT

EXAM:
ULTRASOUND ABDOMEN LIMITED RIGHT UPPER QUADRANT

[Series 1: us abdomen limited · 13 of 130 slices shown]
[im 1/130]
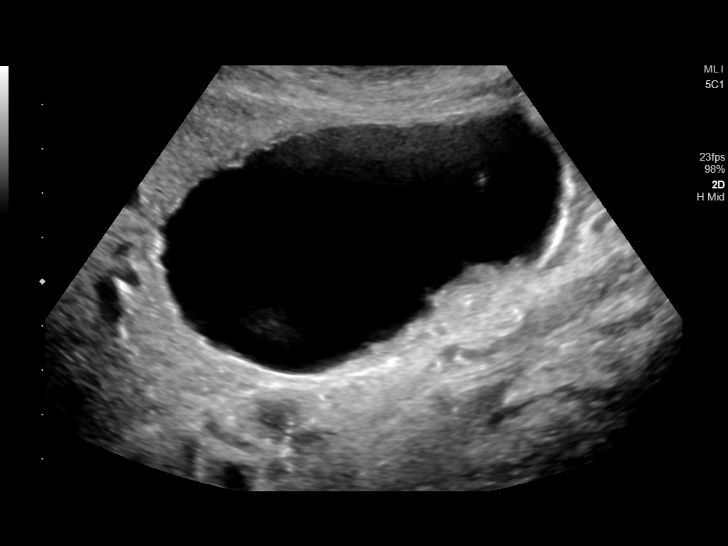
[im 11/130]
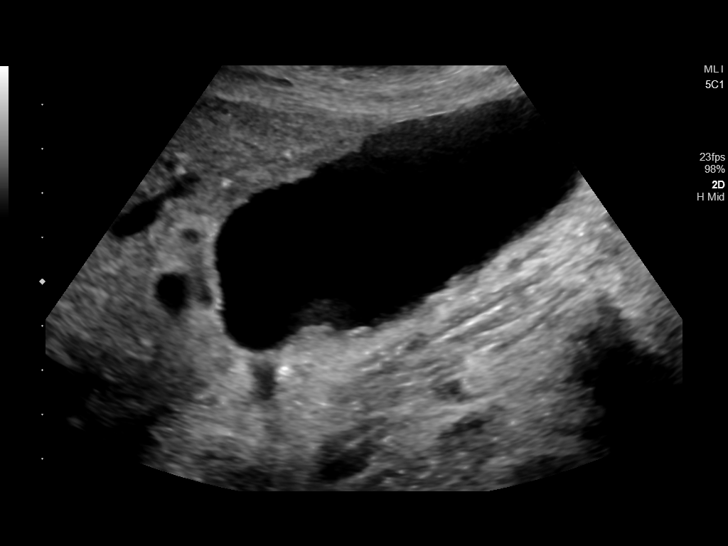
[im 22/130]
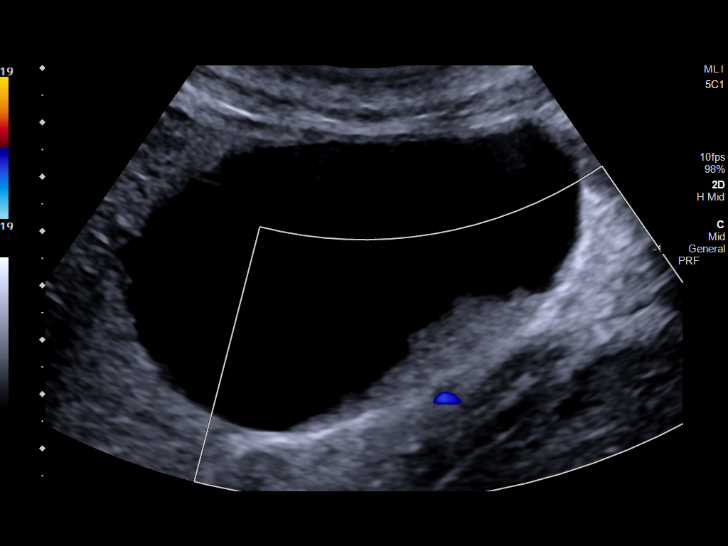
[im 33/130]
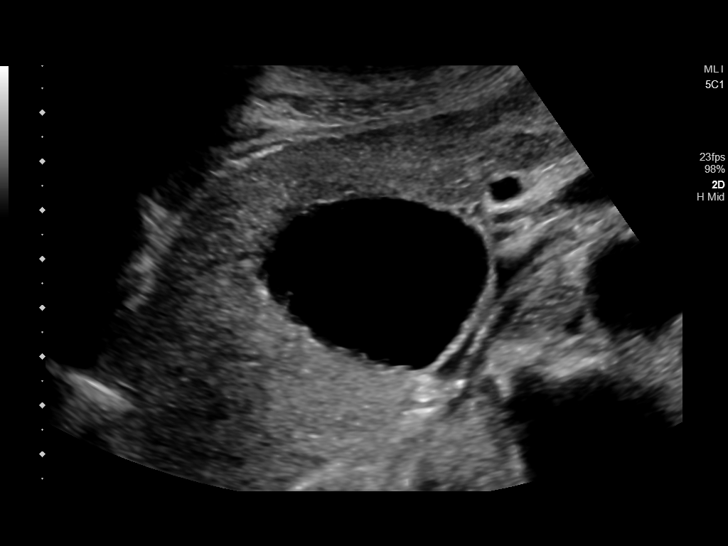
[im 44/130]
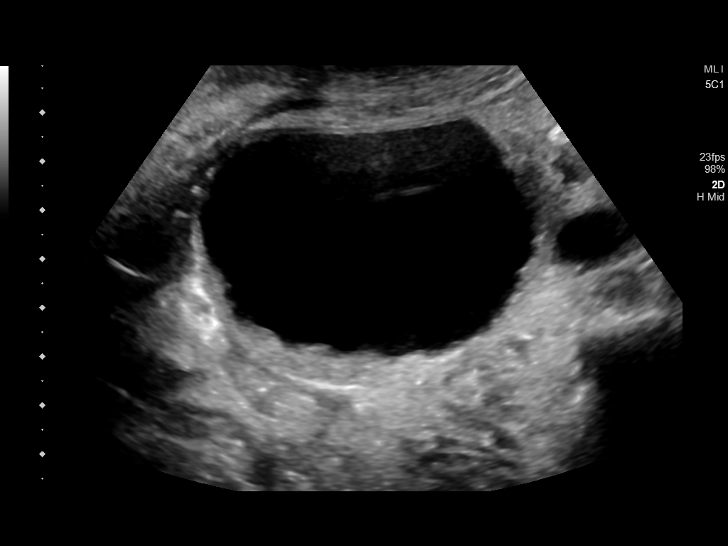
[im 54/130]
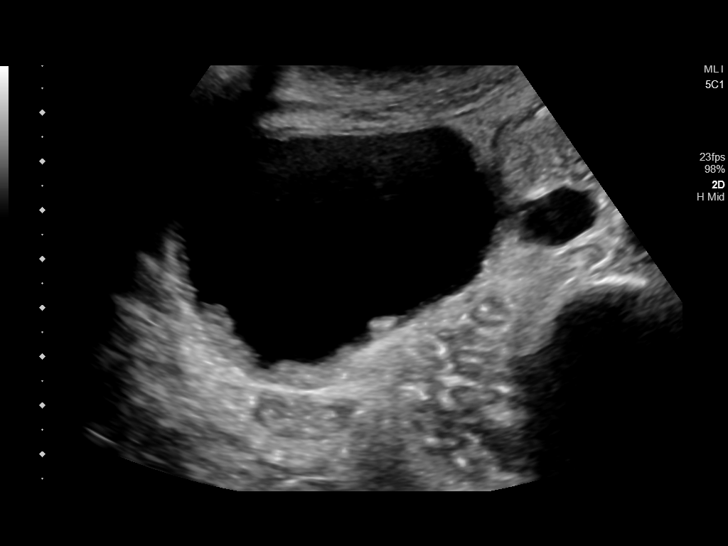
[im 65/130]
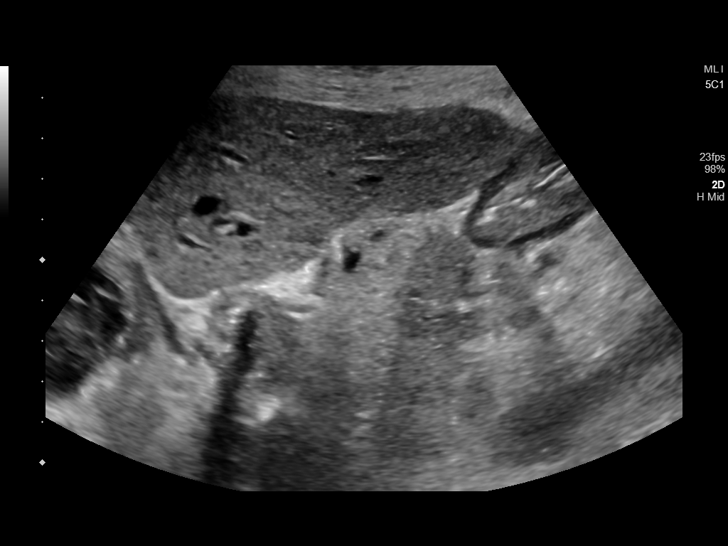
[im 76/130]
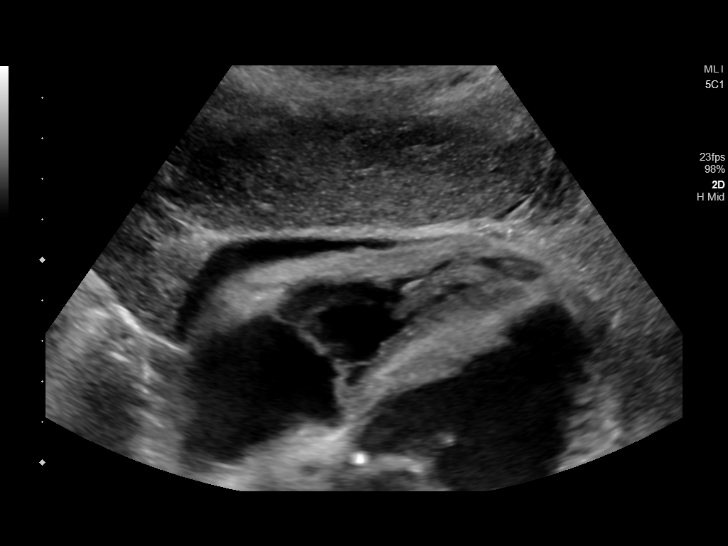
[im 87/130]
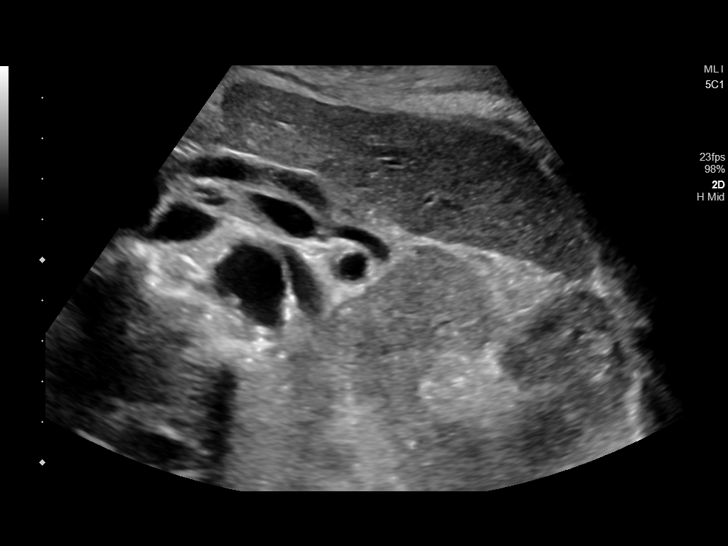
[im 97/130]
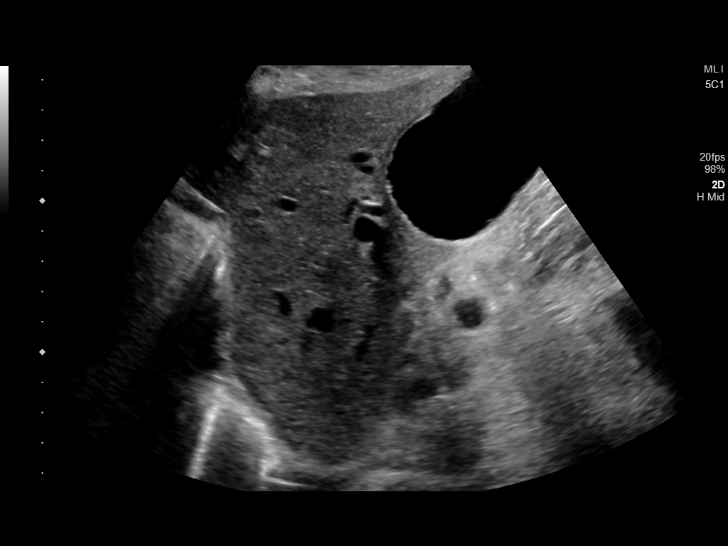
[im 108/130]
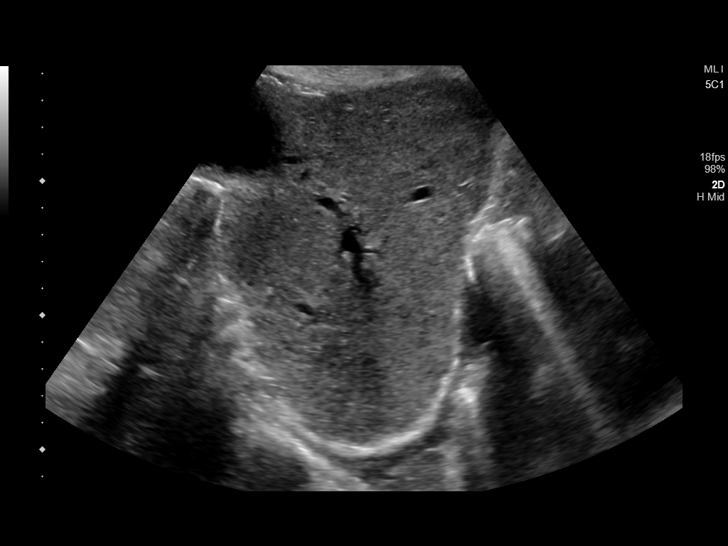
[im 119/130]
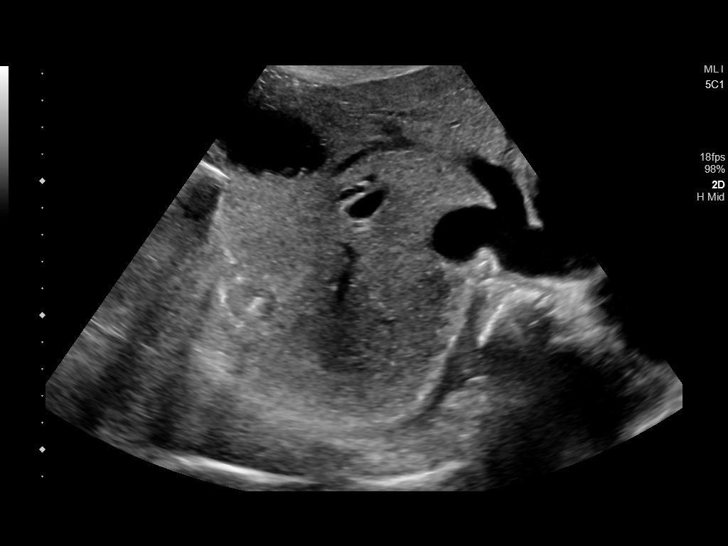
[im 130/130]
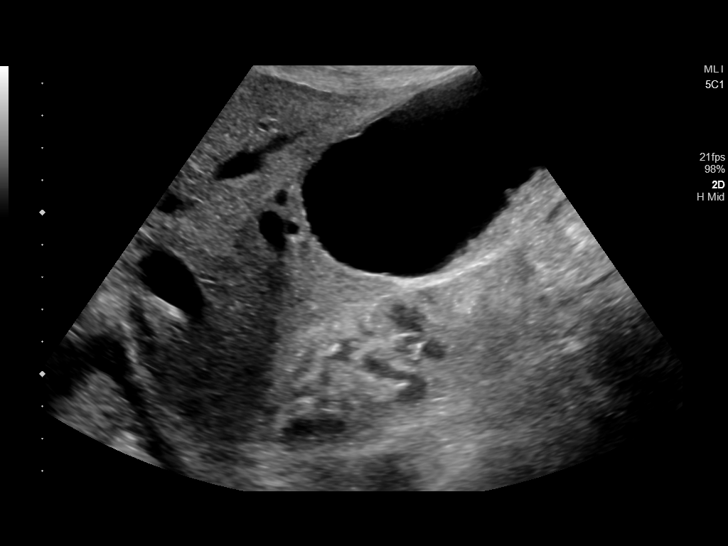

[13 of 25 positions shown; findings below may reference images not displayed]

FINDINGS: Gallbladder:

Gallbladder appears somewhat distended with irregularities along the
gallbladder wall. Sludge is seen in the gallbladder. There are no
convincing echogenic foci which move and shadow as is expected with
gallstones. There is no pericholecystic fluid. No sonographic Murphy
sign noted by sonographer.

Common bile duct:

Diameter: 4 mm. No intrahepatic or extrahepatic biliary duct
dilatation.

Liver:

No focal lesion identified. Within normal limits in parenchymal
echogenicity. Portal vein is patent on color Doppler imaging with
normal direction of blood flow towards the liver.

Trace ascites.
IMPRESSION: 1. Sludge in gallbladder. No convincing gallstones. Tiny gallstones
could be obscured by sludge. The gallbladder is somewhat distended
with mild irregularity along the gallbladder wall. Gallbladder wall
is not frankly thickened, however. Question underlying inflammation
within the gallbladder wall. This combination of findings may
warrant correlation with nuclear medicine hepatobiliary imaging
study to assess for cystic duct patency.

2.  Slight ascites.

3.  Study otherwise unremarkable.

## 2019-12-26 IMAGING — DX DG CHEST 1V PORT
1 series · 1 of 1 positions shown · non-contrast
Comparison: Earlier same day; 04/09/2018; chest CT-04/09/2018

CLINICAL DATA: Post recent right-sided thoracentesis with
persistent respiratory distress.

EXAM:
PORTABLE CHEST 1 VIEW

[chest ap]
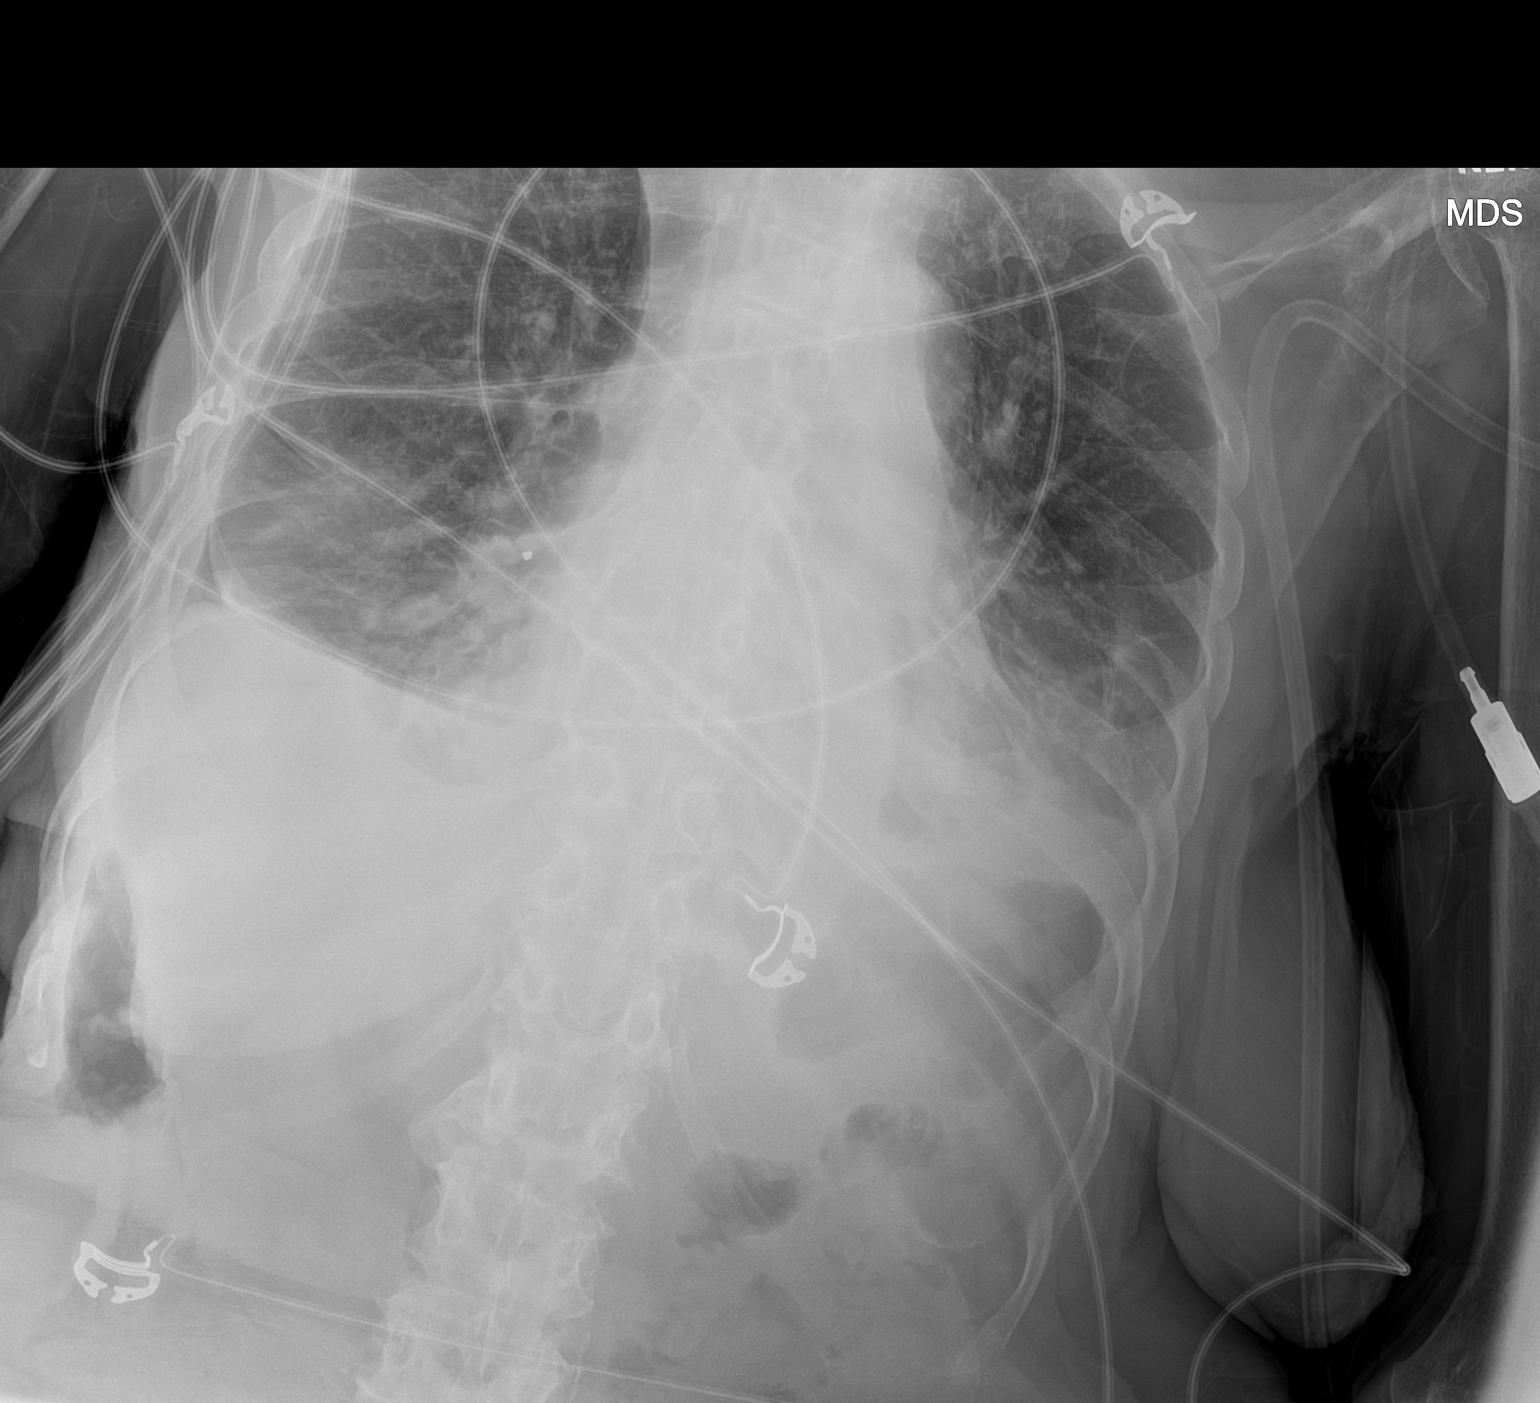

[1 of 1 positions shown; findings below may reference images not displayed]

FINDINGS: Grossly unchanged enlarged cardiac silhouette and mediastinal
contours.

Grossly unchanged small loculated right basilar hydropneumothorax
post recent thoracentesis. No midline shift.

Unchanged small left-sided effusion with associated left basilar
opacities. No new focal airspace opacities. Mild pulmonary venous
congestion without frank evidence of edema. No acute osseus
abnormalities.

No acute osseus abnormalities.
IMPRESSION: 1. Grossly unchanged small loculated right basilar hydropneumothorax
post recent thoracentesis.
2. Unchanged small left-sided effusion associated left basilar
opacities, atelectasis versus infiltrate.
3. Pulmonary venous congestion without frank evidence of edema.

## 2019-12-26 IMAGING — DX DG ABDOMEN 1V
1 series · 1 of 1 positions shown · non-contrast
Comparison: None.

CLINICAL DATA: Orogastric tube placement

EXAM:
ABDOMEN - 1 VIEW

[abdomen erect]
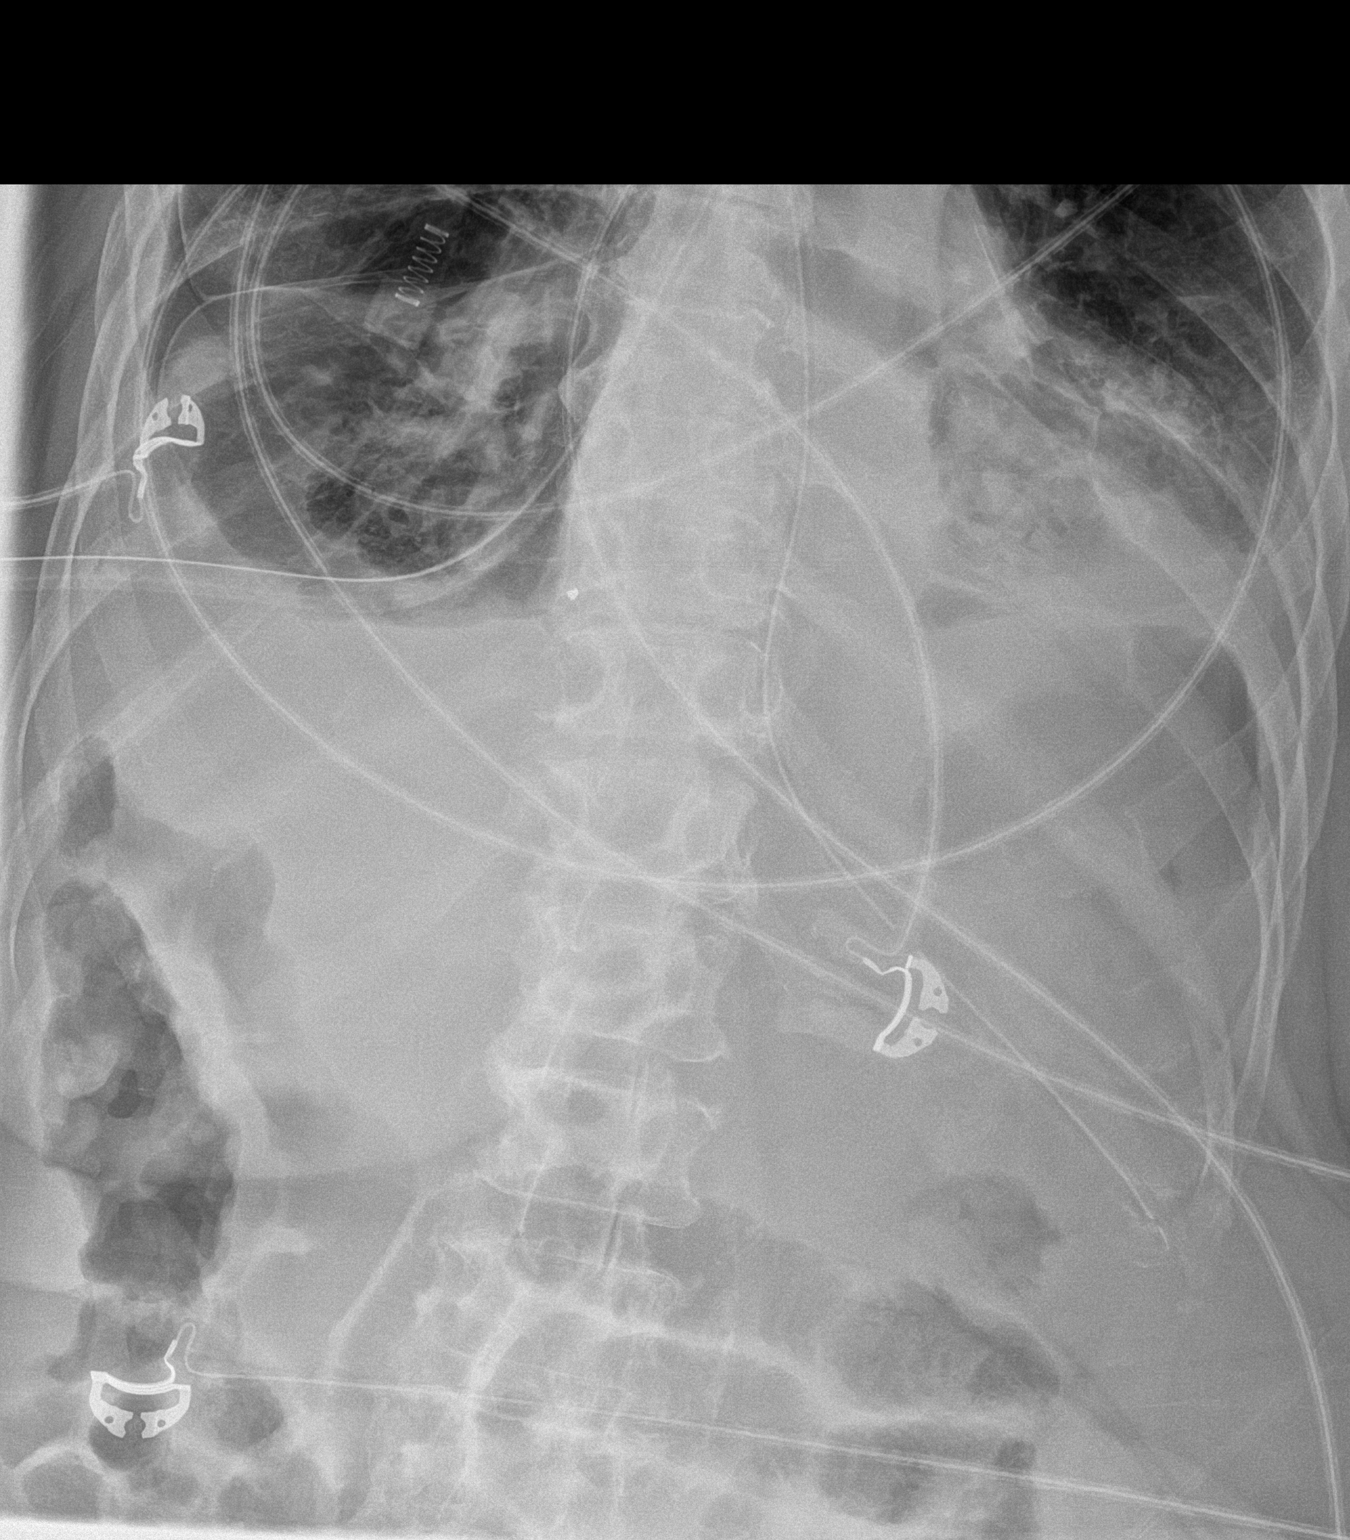

[1 of 1 positions shown; findings below may reference images not displayed]

FINDINGS: Orogastric tube tip and side port project over the stomach.
Bilateral pleural effusions with associated atelectasis. Prominent
small bowel in the central abdomen.
IMPRESSION: Orogastric tube tip and side port projecting over the stomach.

## 2020-01-10 IMAGING — US US EXTREM LOW VENOUS*L*
1 series · 13 of 24 positions shown · non-contrast
Comparison: None.

CLINICAL DATA: 76-year-old female with left lower extremity edema



[Series 1: us extrem low venous*left* · 0.05mm/px · 13 of 34 slices shown]
[im 1/34]
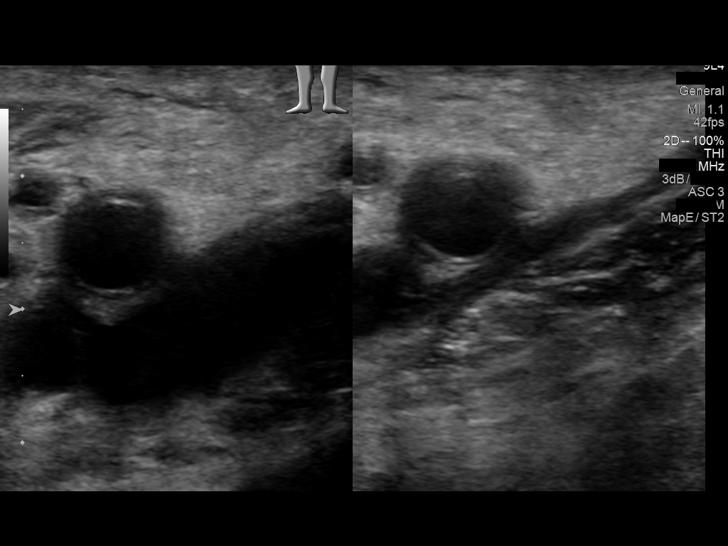
[im 3/34]
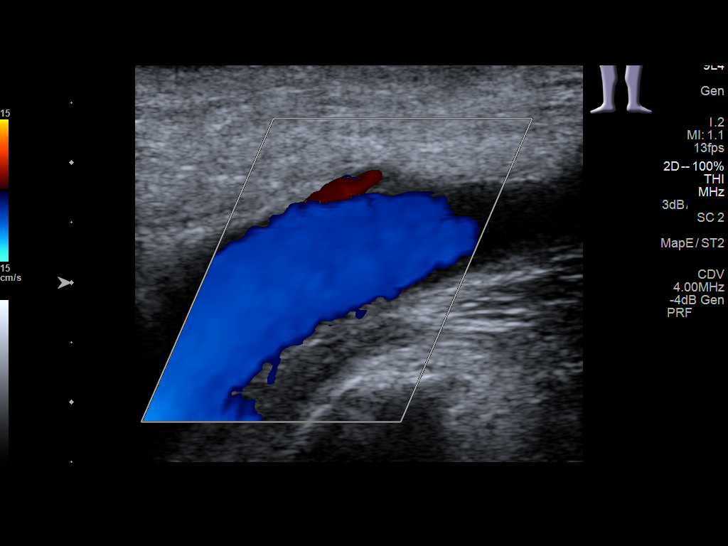
[im 6/34]
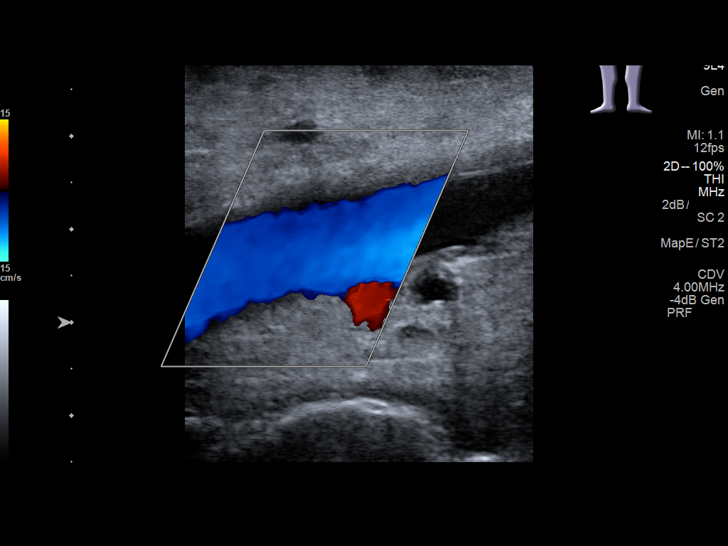
[im 9/34]
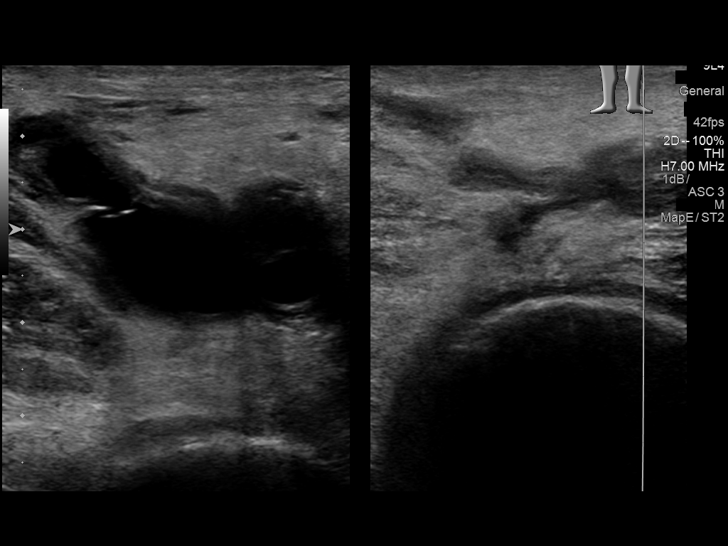
[im 12/34]
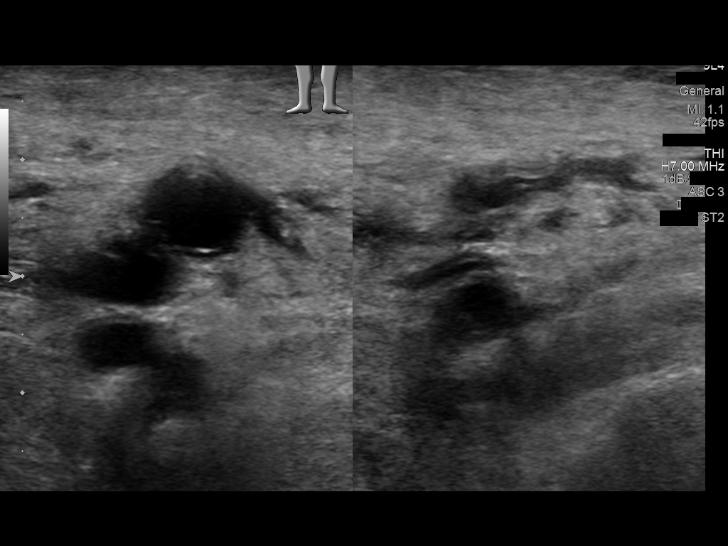
[im 15/34]
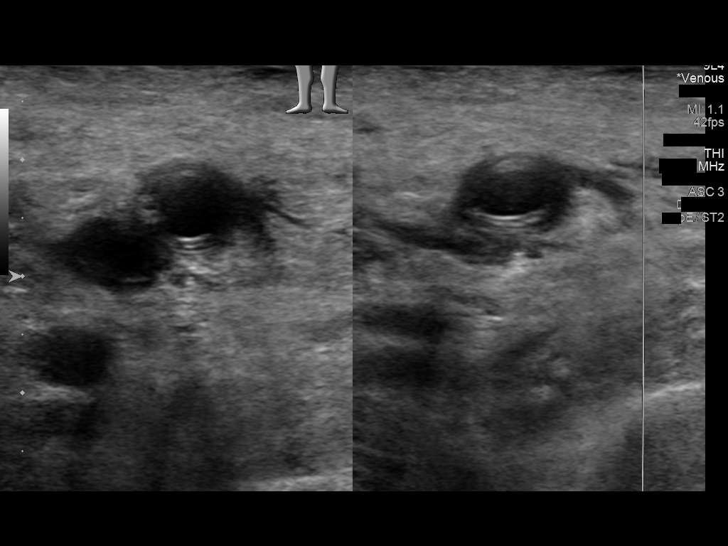
[im 18/34]
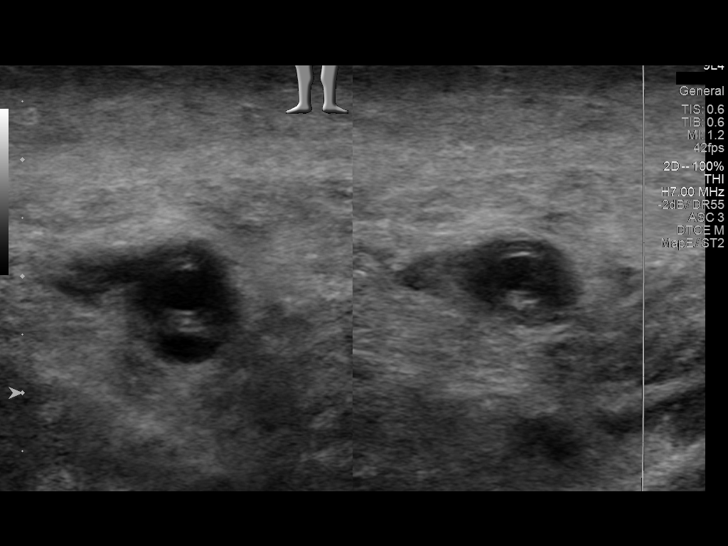
[im 19/34]
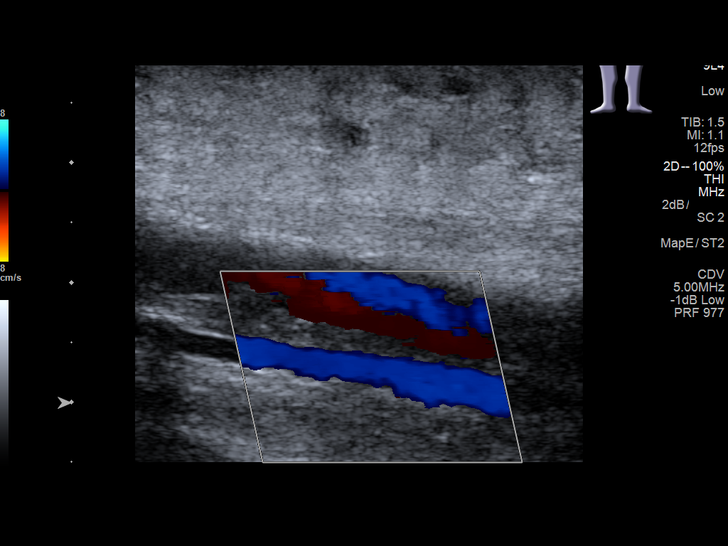
[im 22/34]
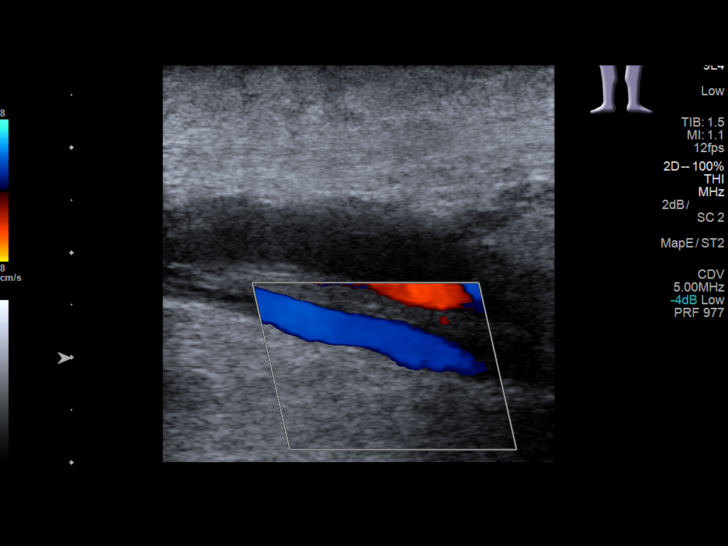
[im 25/34]
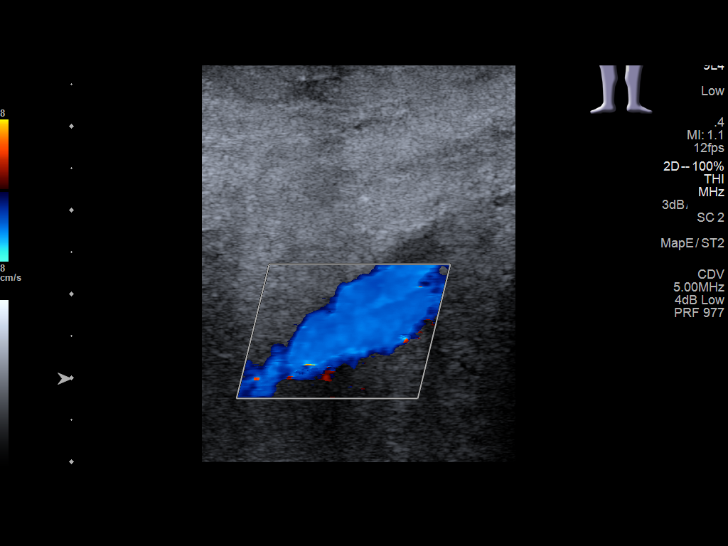
[im 28/34]
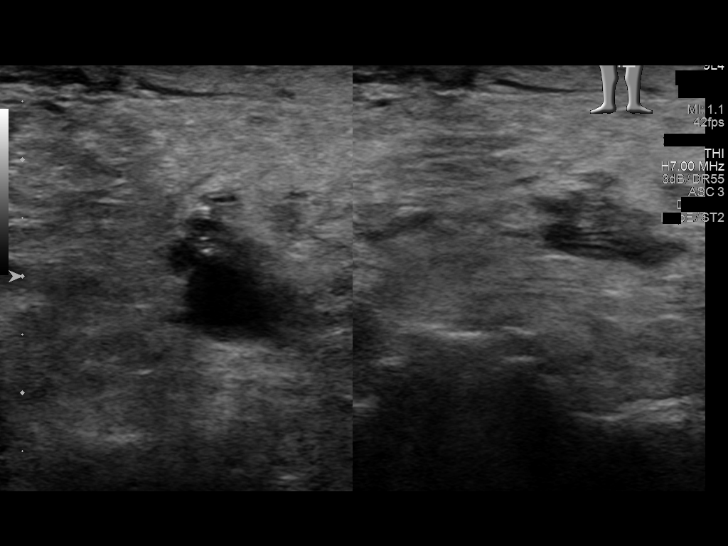
[im 31/34]
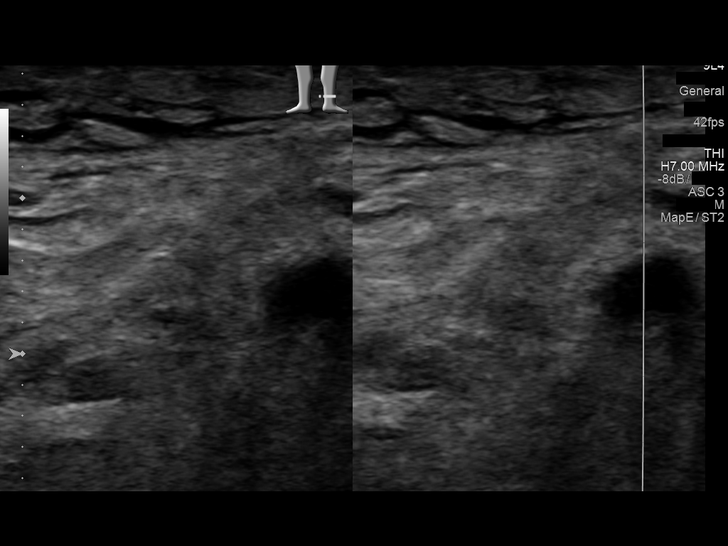
[im 34/34]
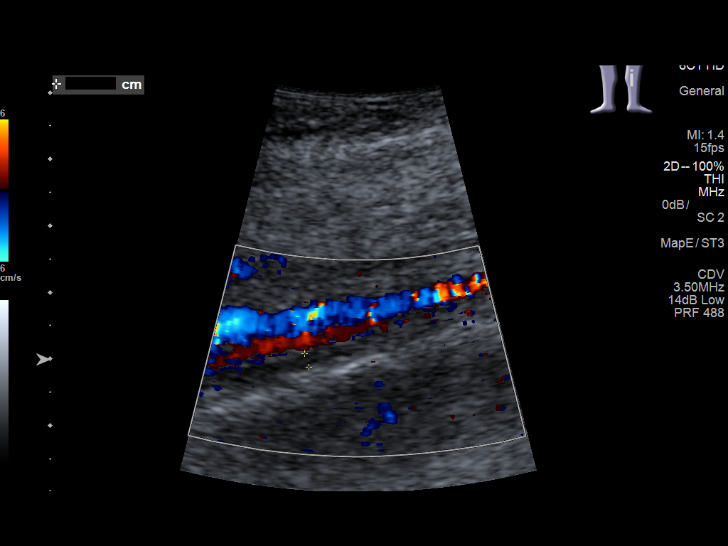

[13 of 24 positions shown; findings below may reference images not displayed]

FINDINGS: Contralateral Common Femoral Vein: Respiratory phasicity is normal
and symmetric with the symptomatic side. No evidence of thrombus.
Normal compressibility.

Common Femoral Vein: No evidence of thrombus. Normal
compressibility, respiratory phasicity and response to augmentation.

Saphenofemoral Junction: No evidence of thrombus. Normal
compressibility and flow on color Doppler imaging.

Profunda Femoral Vein: No evidence of thrombus. Normal
compressibility and flow on color Doppler imaging.

Femoral Vein: No evidence of thrombus. Normal compressibility,
respiratory phasicity and response to augmentation.

Popliteal Vein: No evidence of thrombus. Normal compressibility,
respiratory phasicity and response to augmentation.

Calf Veins: Posterior tibial veins are patent and compressible.
However, 1 of the paired peroneal veins is noncompressible and
demonstrates no evidence of color flow on color Doppler imaging.

Superficial Great Saphenous Vein: No evidence of thrombus. Normal
compressibility.

Venous Reflux:  None.

Other Findings:  None.
IMPRESSION: 1. Positive for isolated calf DVT involving one of the paired
peroneal veins.
